# Patient Record
Sex: Male | Born: 1950 | Race: White | Hispanic: No | Marital: Married | Smoking: Former smoker
Health system: Southern US, Community
[De-identification: ages and names within clinical notes are randomized; demographics above are authoritative.]

## PROBLEM LIST (undated history)

## (undated) DIAGNOSIS — C4491 Basal cell carcinoma of skin, unspecified: Secondary | ICD-10-CM

## (undated) DIAGNOSIS — M1990 Unspecified osteoarthritis, unspecified site: Secondary | ICD-10-CM

## (undated) DIAGNOSIS — G1221 Amyotrophic lateral sclerosis: Secondary | ICD-10-CM

## (undated) DIAGNOSIS — N400 Enlarged prostate without lower urinary tract symptoms: Secondary | ICD-10-CM

## (undated) DIAGNOSIS — N289 Disorder of kidney and ureter, unspecified: Secondary | ICD-10-CM

## (undated) DIAGNOSIS — K5790 Diverticulosis of intestine, part unspecified, without perforation or abscess without bleeding: Secondary | ICD-10-CM

## (undated) DIAGNOSIS — H9190 Unspecified hearing loss, unspecified ear: Secondary | ICD-10-CM

## (undated) DIAGNOSIS — T7840XA Allergy, unspecified, initial encounter: Secondary | ICD-10-CM

## (undated) DIAGNOSIS — N200 Calculus of kidney: Secondary | ICD-10-CM

## (undated) DIAGNOSIS — E119 Type 2 diabetes mellitus without complications: Secondary | ICD-10-CM

## (undated) DIAGNOSIS — Z973 Presence of spectacles and contact lenses: Secondary | ICD-10-CM

## (undated) DIAGNOSIS — K8020 Calculus of gallbladder without cholecystitis without obstruction: Secondary | ICD-10-CM

## (undated) DIAGNOSIS — K635 Polyp of colon: Secondary | ICD-10-CM

## (undated) DIAGNOSIS — K219 Gastro-esophageal reflux disease without esophagitis: Secondary | ICD-10-CM

## (undated) HISTORY — DX: Polyp of colon: K63.5

## (undated) HISTORY — PX: SMALL INTESTINE SURGERY: SHX150

## (undated) HISTORY — DX: Basal cell carcinoma of skin, unspecified: C44.91

## (undated) HISTORY — PX: HERNIA REPAIR: SHX51

## (undated) HISTORY — DX: Calculus of kidney: N20.0

## (undated) HISTORY — PX: CHOLECYSTECTOMY: SHX55

## (undated) HISTORY — DX: Calculus of gallbladder without cholecystitis without obstruction: K80.20

## (undated) HISTORY — DX: Type 2 diabetes mellitus without complications: E11.9

## (undated) HISTORY — PX: CARPAL TUNNEL RELEASE: SHX101

## (undated) HISTORY — DX: Allergy, unspecified, initial encounter: T78.40XA

## (undated) HISTORY — DX: Diverticulosis of intestine, part unspecified, without perforation or abscess without bleeding: K57.90

---

## 2016-10-20 DIAGNOSIS — G5602 Carpal tunnel syndrome, left upper limb: Secondary | ICD-10-CM | POA: Diagnosis not present

## 2016-10-25 DIAGNOSIS — M79642 Pain in left hand: Secondary | ICD-10-CM | POA: Diagnosis not present

## 2016-10-26 DIAGNOSIS — N182 Chronic kidney disease, stage 2 (mild): Secondary | ICD-10-CM | POA: Diagnosis not present

## 2016-10-26 DIAGNOSIS — Z1159 Encounter for screening for other viral diseases: Secondary | ICD-10-CM | POA: Diagnosis not present

## 2016-10-26 DIAGNOSIS — E8881 Metabolic syndrome: Secondary | ICD-10-CM | POA: Diagnosis not present

## 2016-10-26 DIAGNOSIS — Z794 Long term (current) use of insulin: Secondary | ICD-10-CM | POA: Diagnosis not present

## 2016-10-26 DIAGNOSIS — E1122 Type 2 diabetes mellitus with diabetic chronic kidney disease: Secondary | ICD-10-CM | POA: Diagnosis not present

## 2016-10-26 DIAGNOSIS — E785 Hyperlipidemia, unspecified: Secondary | ICD-10-CM | POA: Diagnosis not present

## 2016-10-26 DIAGNOSIS — Z125 Encounter for screening for malignant neoplasm of prostate: Secondary | ICD-10-CM | POA: Diagnosis not present

## 2016-10-27 DIAGNOSIS — M79604 Pain in right leg: Secondary | ICD-10-CM | POA: Diagnosis not present

## 2016-10-27 DIAGNOSIS — R938 Abnormal findings on diagnostic imaging of other specified body structures: Secondary | ICD-10-CM | POA: Diagnosis not present

## 2017-02-09 DIAGNOSIS — E1169 Type 2 diabetes mellitus with other specified complication: Secondary | ICD-10-CM | POA: Diagnosis not present

## 2017-02-09 DIAGNOSIS — Z794 Long term (current) use of insulin: Secondary | ICD-10-CM | POA: Diagnosis not present

## 2017-02-09 DIAGNOSIS — N400 Enlarged prostate without lower urinary tract symptoms: Secondary | ICD-10-CM | POA: Diagnosis not present

## 2017-02-09 DIAGNOSIS — E785 Hyperlipidemia, unspecified: Secondary | ICD-10-CM | POA: Diagnosis not present

## 2017-02-09 DIAGNOSIS — M6530 Trigger finger, unspecified finger: Secondary | ICD-10-CM | POA: Diagnosis not present

## 2017-03-25 DIAGNOSIS — Z794 Long term (current) use of insulin: Secondary | ICD-10-CM | POA: Diagnosis not present

## 2017-03-25 DIAGNOSIS — Z125 Encounter for screening for malignant neoplasm of prostate: Secondary | ICD-10-CM | POA: Diagnosis not present

## 2017-03-25 DIAGNOSIS — E119 Type 2 diabetes mellitus without complications: Secondary | ICD-10-CM | POA: Diagnosis not present

## 2017-03-25 DIAGNOSIS — E785 Hyperlipidemia, unspecified: Secondary | ICD-10-CM | POA: Diagnosis not present

## 2017-05-23 DIAGNOSIS — E119 Type 2 diabetes mellitus without complications: Secondary | ICD-10-CM | POA: Diagnosis not present

## 2017-05-23 DIAGNOSIS — I8393 Asymptomatic varicose veins of bilateral lower extremities: Secondary | ICD-10-CM | POA: Diagnosis not present

## 2017-05-23 DIAGNOSIS — E785 Hyperlipidemia, unspecified: Secondary | ICD-10-CM | POA: Diagnosis not present

## 2017-05-23 DIAGNOSIS — Z794 Long term (current) use of insulin: Secondary | ICD-10-CM | POA: Diagnosis not present

## 2017-05-23 DIAGNOSIS — N400 Enlarged prostate without lower urinary tract symptoms: Secondary | ICD-10-CM | POA: Diagnosis not present

## 2017-06-27 DIAGNOSIS — H9193 Unspecified hearing loss, bilateral: Secondary | ICD-10-CM | POA: Diagnosis not present

## 2017-06-27 DIAGNOSIS — E119 Type 2 diabetes mellitus without complications: Secondary | ICD-10-CM | POA: Diagnosis not present

## 2017-06-27 DIAGNOSIS — E785 Hyperlipidemia, unspecified: Secondary | ICD-10-CM | POA: Diagnosis not present

## 2017-06-27 DIAGNOSIS — N400 Enlarged prostate without lower urinary tract symptoms: Secondary | ICD-10-CM | POA: Diagnosis not present

## 2017-06-27 DIAGNOSIS — I8393 Asymptomatic varicose veins of bilateral lower extremities: Secondary | ICD-10-CM | POA: Diagnosis not present

## 2017-06-27 DIAGNOSIS — E6609 Other obesity due to excess calories: Secondary | ICD-10-CM | POA: Diagnosis not present

## 2017-06-27 DIAGNOSIS — Z6834 Body mass index (BMI) 34.0-34.9, adult: Secondary | ICD-10-CM | POA: Diagnosis not present

## 2017-06-27 DIAGNOSIS — Z66 Do not resuscitate: Secondary | ICD-10-CM | POA: Diagnosis not present

## 2017-06-27 DIAGNOSIS — I10 Essential (primary) hypertension: Secondary | ICD-10-CM | POA: Diagnosis not present

## 2017-06-27 DIAGNOSIS — Z794 Long term (current) use of insulin: Secondary | ICD-10-CM | POA: Diagnosis not present

## 2017-08-31 DIAGNOSIS — M79671 Pain in right foot: Secondary | ICD-10-CM | POA: Diagnosis not present

## 2017-08-31 DIAGNOSIS — E1169 Type 2 diabetes mellitus with other specified complication: Secondary | ICD-10-CM | POA: Diagnosis not present

## 2017-08-31 DIAGNOSIS — Z794 Long term (current) use of insulin: Secondary | ICD-10-CM | POA: Diagnosis not present

## 2017-08-31 DIAGNOSIS — E785 Hyperlipidemia, unspecified: Secondary | ICD-10-CM | POA: Diagnosis not present

## 2017-08-31 DIAGNOSIS — M6530 Trigger finger, unspecified finger: Secondary | ICD-10-CM | POA: Diagnosis not present

## 2017-08-31 DIAGNOSIS — N400 Enlarged prostate without lower urinary tract symptoms: Secondary | ICD-10-CM | POA: Diagnosis not present

## 2017-08-31 DIAGNOSIS — M79672 Pain in left foot: Secondary | ICD-10-CM | POA: Diagnosis not present

## 2017-08-31 DIAGNOSIS — E119 Type 2 diabetes mellitus without complications: Secondary | ICD-10-CM | POA: Diagnosis not present

## 2017-12-06 DIAGNOSIS — M7742 Metatarsalgia, left foot: Secondary | ICD-10-CM | POA: Diagnosis not present

## 2017-12-06 DIAGNOSIS — R29898 Other symptoms and signs involving the musculoskeletal system: Secondary | ICD-10-CM | POA: Diagnosis not present

## 2017-12-06 DIAGNOSIS — M7741 Metatarsalgia, right foot: Secondary | ICD-10-CM | POA: Diagnosis not present

## 2017-12-09 DIAGNOSIS — H04123 Dry eye syndrome of bilateral lacrimal glands: Secondary | ICD-10-CM | POA: Diagnosis not present

## 2017-12-09 DIAGNOSIS — E119 Type 2 diabetes mellitus without complications: Secondary | ICD-10-CM | POA: Diagnosis not present

## 2017-12-09 DIAGNOSIS — H2511 Age-related nuclear cataract, right eye: Secondary | ICD-10-CM | POA: Diagnosis not present

## 2017-12-09 DIAGNOSIS — Z961 Presence of intraocular lens: Secondary | ICD-10-CM | POA: Diagnosis not present

## 2017-12-20 DIAGNOSIS — R350 Frequency of micturition: Secondary | ICD-10-CM | POA: Diagnosis not present

## 2017-12-20 DIAGNOSIS — N400 Enlarged prostate without lower urinary tract symptoms: Secondary | ICD-10-CM | POA: Diagnosis not present

## 2017-12-26 DIAGNOSIS — H2510 Age-related nuclear cataract, unspecified eye: Secondary | ICD-10-CM | POA: Diagnosis not present

## 2018-01-05 DIAGNOSIS — H25011 Cortical age-related cataract, right eye: Secondary | ICD-10-CM | POA: Diagnosis not present

## 2018-01-05 DIAGNOSIS — H2511 Age-related nuclear cataract, right eye: Secondary | ICD-10-CM | POA: Diagnosis not present

## 2018-01-05 DIAGNOSIS — E119 Type 2 diabetes mellitus without complications: Secondary | ICD-10-CM | POA: Diagnosis not present

## 2018-01-05 DIAGNOSIS — H40013 Open angle with borderline findings, low risk, bilateral: Secondary | ICD-10-CM | POA: Diagnosis not present

## 2018-01-31 DIAGNOSIS — H2513 Age-related nuclear cataract, bilateral: Secondary | ICD-10-CM | POA: Diagnosis not present

## 2018-01-31 DIAGNOSIS — H25811 Combined forms of age-related cataract, right eye: Secondary | ICD-10-CM | POA: Diagnosis not present

## 2018-02-02 DIAGNOSIS — J0101 Acute recurrent maxillary sinusitis: Secondary | ICD-10-CM | POA: Diagnosis not present

## 2018-02-02 DIAGNOSIS — J209 Acute bronchitis, unspecified: Secondary | ICD-10-CM | POA: Diagnosis not present

## 2018-02-07 DIAGNOSIS — H2511 Age-related nuclear cataract, right eye: Secondary | ICD-10-CM | POA: Diagnosis not present

## 2018-05-03 DIAGNOSIS — R350 Frequency of micturition: Secondary | ICD-10-CM | POA: Diagnosis not present

## 2018-05-03 DIAGNOSIS — L918 Other hypertrophic disorders of the skin: Secondary | ICD-10-CM | POA: Diagnosis not present

## 2018-05-03 DIAGNOSIS — N400 Enlarged prostate without lower urinary tract symptoms: Secondary | ICD-10-CM | POA: Diagnosis not present

## 2018-05-03 DIAGNOSIS — M79604 Pain in right leg: Secondary | ICD-10-CM | POA: Diagnosis not present

## 2018-05-03 DIAGNOSIS — E119 Type 2 diabetes mellitus without complications: Secondary | ICD-10-CM | POA: Diagnosis not present

## 2018-05-03 DIAGNOSIS — M79605 Pain in left leg: Secondary | ICD-10-CM | POA: Diagnosis not present

## 2018-05-03 DIAGNOSIS — L820 Inflamed seborrheic keratosis: Secondary | ICD-10-CM | POA: Diagnosis not present

## 2018-05-03 DIAGNOSIS — I83813 Varicose veins of bilateral lower extremities with pain: Secondary | ICD-10-CM | POA: Diagnosis not present

## 2018-05-04 DIAGNOSIS — E119 Type 2 diabetes mellitus without complications: Secondary | ICD-10-CM | POA: Diagnosis not present

## 2018-05-08 DIAGNOSIS — M79604 Pain in right leg: Secondary | ICD-10-CM | POA: Diagnosis not present

## 2018-05-08 DIAGNOSIS — M79605 Pain in left leg: Secondary | ICD-10-CM | POA: Diagnosis not present

## 2018-05-08 DIAGNOSIS — I83893 Varicose veins of bilateral lower extremities with other complications: Secondary | ICD-10-CM | POA: Diagnosis not present

## 2018-05-10 DIAGNOSIS — M25561 Pain in right knee: Secondary | ICD-10-CM | POA: Diagnosis not present

## 2018-05-10 DIAGNOSIS — M25562 Pain in left knee: Secondary | ICD-10-CM | POA: Diagnosis not present

## 2018-05-10 DIAGNOSIS — M170 Bilateral primary osteoarthritis of knee: Secondary | ICD-10-CM | POA: Diagnosis not present

## 2018-05-12 DIAGNOSIS — M170 Bilateral primary osteoarthritis of knee: Secondary | ICD-10-CM | POA: Diagnosis not present

## 2018-05-24 DIAGNOSIS — D225 Melanocytic nevi of trunk: Secondary | ICD-10-CM | POA: Diagnosis not present

## 2018-05-24 DIAGNOSIS — B078 Other viral warts: Secondary | ICD-10-CM | POA: Diagnosis not present

## 2018-08-01 DIAGNOSIS — E119 Type 2 diabetes mellitus without complications: Secondary | ICD-10-CM | POA: Diagnosis not present

## 2018-08-04 DIAGNOSIS — I10 Essential (primary) hypertension: Secondary | ICD-10-CM | POA: Diagnosis not present

## 2018-08-04 DIAGNOSIS — E785 Hyperlipidemia, unspecified: Secondary | ICD-10-CM | POA: Diagnosis not present

## 2018-08-04 DIAGNOSIS — E1169 Type 2 diabetes mellitus with other specified complication: Secondary | ICD-10-CM | POA: Diagnosis not present

## 2018-08-04 DIAGNOSIS — E119 Type 2 diabetes mellitus without complications: Secondary | ICD-10-CM | POA: Diagnosis not present

## 2018-08-04 DIAGNOSIS — E669 Obesity, unspecified: Secondary | ICD-10-CM | POA: Diagnosis not present

## 2018-08-04 DIAGNOSIS — N400 Enlarged prostate without lower urinary tract symptoms: Secondary | ICD-10-CM | POA: Diagnosis not present

## 2018-08-04 DIAGNOSIS — Z6835 Body mass index (BMI) 35.0-35.9, adult: Secondary | ICD-10-CM | POA: Diagnosis not present

## 2018-08-04 DIAGNOSIS — Z0000 Encounter for general adult medical examination without abnormal findings: Secondary | ICD-10-CM | POA: Diagnosis not present

## 2018-08-04 DIAGNOSIS — Z794 Long term (current) use of insulin: Secondary | ICD-10-CM | POA: Diagnosis not present

## 2018-08-04 DIAGNOSIS — Z23 Encounter for immunization: Secondary | ICD-10-CM | POA: Diagnosis not present

## 2018-08-07 DIAGNOSIS — M79604 Pain in right leg: Secondary | ICD-10-CM | POA: Diagnosis not present

## 2018-08-07 DIAGNOSIS — M79605 Pain in left leg: Secondary | ICD-10-CM | POA: Diagnosis not present

## 2018-08-07 DIAGNOSIS — I83893 Varicose veins of bilateral lower extremities with other complications: Secondary | ICD-10-CM | POA: Diagnosis not present

## 2018-08-26 DIAGNOSIS — I10 Essential (primary) hypertension: Secondary | ICD-10-CM | POA: Diagnosis not present

## 2018-08-26 DIAGNOSIS — Z7982 Long term (current) use of aspirin: Secondary | ICD-10-CM | POA: Diagnosis not present

## 2018-08-26 DIAGNOSIS — Z6834 Body mass index (BMI) 34.0-34.9, adult: Secondary | ICD-10-CM | POA: Diagnosis not present

## 2018-08-26 DIAGNOSIS — L602 Onychogryphosis: Secondary | ICD-10-CM | POA: Diagnosis not present

## 2018-08-26 DIAGNOSIS — E119 Type 2 diabetes mellitus without complications: Secondary | ICD-10-CM | POA: Diagnosis not present

## 2018-08-26 DIAGNOSIS — N400 Enlarged prostate without lower urinary tract symptoms: Secondary | ICD-10-CM | POA: Diagnosis not present

## 2018-08-26 DIAGNOSIS — E6609 Other obesity due to excess calories: Secondary | ICD-10-CM | POA: Diagnosis not present

## 2018-08-26 DIAGNOSIS — E785 Hyperlipidemia, unspecified: Secondary | ICD-10-CM | POA: Diagnosis not present

## 2018-08-26 DIAGNOSIS — Z823 Family history of stroke: Secondary | ICD-10-CM | POA: Diagnosis not present

## 2018-08-26 DIAGNOSIS — Z794 Long term (current) use of insulin: Secondary | ICD-10-CM | POA: Diagnosis not present

## 2018-10-30 DIAGNOSIS — E119 Type 2 diabetes mellitus without complications: Secondary | ICD-10-CM | POA: Diagnosis not present

## 2018-12-04 DIAGNOSIS — M1711 Unilateral primary osteoarthritis, right knee: Secondary | ICD-10-CM | POA: Diagnosis not present

## 2018-12-04 DIAGNOSIS — M25561 Pain in right knee: Secondary | ICD-10-CM | POA: Diagnosis not present

## 2018-12-04 DIAGNOSIS — G8929 Other chronic pain: Secondary | ICD-10-CM | POA: Diagnosis not present

## 2018-12-07 ENCOUNTER — Other Ambulatory Visit: Payer: Self-pay | Admitting: Surgery

## 2018-12-07 DIAGNOSIS — M25561 Pain in right knee: Secondary | ICD-10-CM

## 2018-12-07 DIAGNOSIS — G8929 Other chronic pain: Secondary | ICD-10-CM

## 2018-12-07 DIAGNOSIS — M1711 Unilateral primary osteoarthritis, right knee: Secondary | ICD-10-CM

## 2018-12-12 ENCOUNTER — Ambulatory Visit
Admission: RE | Admit: 2018-12-12 | Discharge: 2018-12-12 | Disposition: A | Discharge status: Home / Self Care | Payer: Medicare HMO | Source: Ambulatory Visit | Attending: Surgery | Admitting: Surgery

## 2018-12-12 DIAGNOSIS — G8929 Other chronic pain: Secondary | ICD-10-CM

## 2018-12-12 DIAGNOSIS — M25561 Pain in right knee: Secondary | ICD-10-CM | POA: Insufficient documentation

## 2018-12-12 DIAGNOSIS — M1711 Unilateral primary osteoarthritis, right knee: Secondary | ICD-10-CM | POA: Insufficient documentation

## 2019-01-30 DIAGNOSIS — E119 Type 2 diabetes mellitus without complications: Secondary | ICD-10-CM | POA: Diagnosis not present

## 2019-02-13 DIAGNOSIS — E1169 Type 2 diabetes mellitus with other specified complication: Secondary | ICD-10-CM | POA: Diagnosis not present

## 2019-02-13 DIAGNOSIS — I10 Essential (primary) hypertension: Secondary | ICD-10-CM | POA: Diagnosis not present

## 2019-02-13 DIAGNOSIS — E785 Hyperlipidemia, unspecified: Secondary | ICD-10-CM | POA: Diagnosis not present

## 2019-02-13 DIAGNOSIS — Z794 Long term (current) use of insulin: Secondary | ICD-10-CM | POA: Diagnosis not present

## 2019-02-13 DIAGNOSIS — J0191 Acute recurrent sinusitis, unspecified: Secondary | ICD-10-CM | POA: Diagnosis not present

## 2019-04-30 DIAGNOSIS — E119 Type 2 diabetes mellitus without complications: Secondary | ICD-10-CM | POA: Diagnosis not present

## 2019-05-03 DIAGNOSIS — E1169 Type 2 diabetes mellitus with other specified complication: Secondary | ICD-10-CM | POA: Diagnosis not present

## 2019-05-03 DIAGNOSIS — B353 Tinea pedis: Secondary | ICD-10-CM | POA: Diagnosis not present

## 2019-05-03 DIAGNOSIS — E785 Hyperlipidemia, unspecified: Secondary | ICD-10-CM | POA: Diagnosis not present

## 2019-05-06 DIAGNOSIS — R21 Rash and other nonspecific skin eruption: Secondary | ICD-10-CM | POA: Diagnosis not present

## 2019-05-06 DIAGNOSIS — L03115 Cellulitis of right lower limb: Secondary | ICD-10-CM | POA: Insufficient documentation

## 2019-05-10 ENCOUNTER — Other Ambulatory Visit: Payer: Self-pay

## 2019-05-10 ENCOUNTER — Emergency Department: Payer: Medicare HMO

## 2019-05-10 ENCOUNTER — Emergency Department
Admission: EM | Admit: 2019-05-10 | Discharge: 2019-05-10 | Disposition: A | Discharge status: Home / Self Care | Payer: Medicare HMO | Attending: Emergency Medicine | Admitting: Emergency Medicine

## 2019-05-10 ENCOUNTER — Encounter: Payer: Self-pay | Admitting: Emergency Medicine

## 2019-05-10 DIAGNOSIS — R600 Localized edema: Secondary | ICD-10-CM | POA: Diagnosis not present

## 2019-05-10 DIAGNOSIS — I871 Compression of vein: Secondary | ICD-10-CM | POA: Diagnosis not present

## 2019-05-10 DIAGNOSIS — R2241 Localized swelling, mass and lump, right lower limb: Secondary | ICD-10-CM | POA: Diagnosis not present

## 2019-05-10 DIAGNOSIS — E119 Type 2 diabetes mellitus without complications: Secondary | ICD-10-CM | POA: Diagnosis not present

## 2019-05-10 DIAGNOSIS — L03115 Cellulitis of right lower limb: Secondary | ICD-10-CM | POA: Insufficient documentation

## 2019-05-10 DIAGNOSIS — Z7982 Long term (current) use of aspirin: Secondary | ICD-10-CM | POA: Insufficient documentation

## 2019-05-10 DIAGNOSIS — M79604 Pain in right leg: Secondary | ICD-10-CM | POA: Diagnosis present

## 2019-05-10 DIAGNOSIS — I872 Venous insufficiency (chronic) (peripheral): Secondary | ICD-10-CM

## 2019-05-10 HISTORY — DX: Disorder of kidney and ureter, unspecified: N28.9

## 2019-05-10 LAB — CBC WITH DIFFERENTIAL/PLATELET
Abs Immature Granulocytes: 0.02 10*3/uL (ref 0.00–0.07)
Basophils Absolute: 0.1 10*3/uL (ref 0.0–0.1)
Basophils Relative: 1 %
Eosinophils Absolute: 0.4 10*3/uL (ref 0.0–0.5)
Eosinophils Relative: 5 %
HCT: 46 % (ref 39.0–52.0)
Hemoglobin: 15.7 g/dL (ref 13.0–17.0)
Immature Granulocytes: 0 %
Lymphocytes Relative: 24 %
Lymphs Abs: 1.8 10*3/uL (ref 0.7–4.0)
MCH: 30.5 pg (ref 26.0–34.0)
MCHC: 34.1 g/dL (ref 30.0–36.0)
MCV: 89.3 fL (ref 80.0–100.0)
Monocytes Absolute: 0.8 10*3/uL (ref 0.1–1.0)
Monocytes Relative: 10 %
Neutro Abs: 4.6 10*3/uL (ref 1.7–7.7)
Neutrophils Relative %: 60 %
Platelets: 223 10*3/uL (ref 150–400)
RBC: 5.15 MIL/uL (ref 4.22–5.81)
RDW: 12.7 % (ref 11.5–15.5)
WBC: 7.6 10*3/uL (ref 4.0–10.5)
nRBC: 0 % (ref 0.0–0.2)

## 2019-05-10 LAB — BASIC METABOLIC PANEL
Anion gap: 11 (ref 5–15)
BUN: 18 mg/dL (ref 8–23)
CO2: 21 mmol/L — ABNORMAL LOW (ref 22–32)
Calcium: 9 mg/dL (ref 8.9–10.3)
Chloride: 107 mmol/L (ref 98–111)
Creatinine, Ser: 0.85 mg/dL (ref 0.61–1.24)
GFR calc Af Amer: >60 mL/min (ref >60)
GFR calc non Af Amer: >60 mL/min (ref >60)
Glucose, Bld: 152 mg/dL — ABNORMAL HIGH (ref 70–99)
Potassium: 4 mmol/L (ref 3.5–5.1)
Sodium: 139 mmol/L (ref 135–145)

## 2019-05-10 MED ORDER — CLINDAMYCIN PHOSPHATE 600 MG/50ML IV SOLN
600.0000 mg | Freq: Once | INTRAVENOUS | Status: AC
  Administered 2019-05-10: 600 mg via INTRAVENOUS
  Filled 2019-05-10: qty 50

## 2019-05-10 MED ORDER — HYDROXYZINE HCL 50 MG PO TABS
50.0000 mg | ORAL_TABLET | Freq: Once | ORAL | Status: AC
  Administered 2019-05-10: 12:00:00 50 mg via ORAL
  Filled 2019-05-10: qty 1

## 2019-05-10 MED ORDER — HYDROXYZINE HCL 50 MG PO TABS: 50.0000 mg | ORAL_TABLET | Freq: Three times a day (TID) | ORAL | 0 refills | Status: DC | PRN

## 2019-05-10 MED ORDER — CLINDAMYCIN HCL 300 MG PO CAPS: 300.0000 mg | ORAL_CAPSULE | Freq: Three times a day (TID) | ORAL | 0 refills | Status: AC

## 2019-05-10 NOTE — ED Provider Notes (Signed)
[facility] Emergency Department Provider Note   ____________________________________________   First MD Initiated Contact with Patient 05/10/19 1119     (approximate)  I have reviewed the triage vital signs and the nursing notes.   HISTORY  Chief Complaint Rash    HPI Jonathan Shelton is a 68 y.o. male patient complain of a rash to the right medial ankle secondary to scratching area 2 weeks ago.  Patient state teleconference evaluation with PCP and diagnosed with athlete's foot.  Patient state he was given a cream which darkened his skin.  Patient is worried about "flesh eating disease" due to a family member having this condition 2 years ago.  Patient also relates that he hit his right shin on a metal boat 2 months ago and fell onto the leg.  Patient state he was in [place] last week and went to urgent care clinic.  Patient was not given a definitive diagnosis but was prescribed a steroidal cream and doxycycline.  Patient denies fever or drainage from area at this time.     Past Medical History:  Diagnosis Date  . Diabetes mellitus without complication ([place])   . Renal disorder     There are no active problems to display for this patient.   Past Surgical History:  Procedure Laterality Date  . CHOLECYSTECTOMY    . HERNIA REPAIR      Prior to Admission medications   Medication Sig Start Date End Date Taking? Authorizing Provider  aspirin 81 MG chewable tablet Chew by mouth daily.   Yes [provider]  atorvastatin (LIPITOR) 10 MG tablet Take 10 mg by mouth daily.   Yes [provider]  empagliflozin (JARDIANCE) 25 MG TABS tablet Take 25 mg by mouth daily.   Yes [provider]  losartan (COZAAR) 50 MG tablet Take 50 mg by mouth daily.   Yes [provider]  metFORMIN (GLUCOPHAGE) 1000 MG tablet Take 1,000 mg by mouth 2 (two) times daily with a meal.   Yes [provider]  clindamycin (CLEOCIN) 300 MG  capsule Take 1 capsule (300 mg total) by mouth 3 (three) times daily for 10 days. 05/10/19 05/20/19  Sable Feil, PA-C  hydrOXYzine (ATARAX/VISTARIL) 50 MG tablet Take 1 tablet (50 mg total) by mouth 3 (three) times daily as needed for itching. 05/10/19   Sable Feil, PA-C    Allergies Testosterone  No family history on file.  Social History Social History   Tobacco Use  . Smoking status: Never Smoker  . Smokeless tobacco: Never Used  Substance Use Topics  . Alcohol use: Not Currently  . Drug use: Not on file    Review of Systems  Constitutional: No fever/chills Eyes: No visual changes. ENT: No sore throat. Cardiovascular: Denies chest pain. Respiratory: Denies shortness of breath. Gastrointestinal: No abdominal pain.  No nausea, no vomiting.  No diarrhea.  No constipation. Genitourinary: Negative for dysuria. Musculoskeletal: Negative for back pain. Skin: Edema and erythema right medial ankle.   Neurological: Negative for headaches, focal weakness or numbness. Endocrine:  Diabetes Allergic/Immunilogical: Testosterone ____________________________________________   PHYSICAL EXAM:  VITAL SIGNS: ED Triage Vitals  Enc Vitals Group     BP 05/10/19 1044 (!) 144/82     Pulse Rate 05/10/19 1044 79     Resp 05/10/19 1044 17     Temp 05/10/19 1044 98.6 F (37 C)     Temp Source 05/10/19 1044 Oral     SpO2 05/10/19 1044 95 %  Weight 05/10/19 1045 250 lb (113.4 kg)     Height 05/10/19 1045 5\' 10"  (1.778 m)     Head Circumference --      Peak Flow --      Pain Score 05/10/19 1051 0     Pain Loc --      Pain Edu? --      Excl. in [place]? --     Constitutional: Alert and oriented. Well appearing and in no acute distress. Cardiovascular: Normal rate, regular rhythm. Grossly normal heart sounds.  Good peripheral circulation.  Bilateral varicose veins. Respiratory: Normal respiratory effort.  No retractions. Lungs CTAB. Musculoskeletal: Edema  right lower leg.    Neurologic:  Normal speech and language. No gross focal neurologic deficits are appreciated. No gait instability. Skin: Lower extremities edematous/erythematous. Psychiatric: Mood and affect are normal. Speech and behavior are normal.  ____________________________________________   LABS (all labs ordered are listed, but only abnormal results are displayed)  Labs Reviewed  BASIC METABOLIC PANEL - Abnormal; Notable for the following components:      Result Value   CO2 21 (*)    Glucose, Bld 152 (*)    All other components within normal limits  CBC WITH DIFFERENTIAL/PLATELET   ____________________________________________  EKG   ____________________________________________  RADIOLOGY  ED MD interpretation:    Official radiology report(s): US Venous Img Lower Unilateral Right  Result Date: 05/10/2019 CLINICAL DATA:  68 year old male with a history edema and redness EXAM: RIGHT LOWER EXTREMITY VENOUS DOPPLER ULTRASOUND TECHNIQUE: Gray-scale sonography with graded compression, as well as color Doppler and duplex ultrasound were performed to evaluate the lower extremity deep venous systems from the level of the common femoral vein and including the common femoral, femoral, profunda femoral, popliteal and calf veins including the posterior tibial, peroneal and gastrocnemius veins when visible. The superficial great saphenous vein was also interrogated. Spectral Doppler was utilized to evaluate flow at rest and with distal augmentation maneuvers in the common femoral, femoral and popliteal veins. COMPARISON:  None. FINDINGS: Contralateral Common Femoral Vein: Respiratory phasicity is normal and symmetric with the symptomatic side. No evidence of thrombus. Normal compressibility. Common Femoral Vein: No evidence of thrombus. Normal compressibility, respiratory phasicity and response to augmentation. Saphenofemoral Junction: No evidence of thrombus. Normal compressibility and flow on color  Doppler imaging. Profunda Femoral Vein: No evidence of thrombus. Normal compressibility and flow on color Doppler imaging. Femoral Vein: No evidence of thrombus. Normal compressibility, respiratory phasicity and response to augmentation. Popliteal Vein: No evidence of thrombus. Normal compressibility, respiratory phasicity and response to augmentation. Calf Veins: No evidence of thrombus. Normal compressibility and flow on color Doppler imaging. Superficial Great Saphenous Vein: No evidence of thrombus. Normal compressibility and flow on color Doppler imaging. Other Findings:  Edema of the right lower extremity IMPRESSION: Sonographic survey of the right lower extremity negative for DVT Right lower extremity edema Electronically Signed   By: Corrie Mckusick D.O.   On: 05/10/2019 13:22    ____________________________________________   PROCEDURES  Procedure(s) performed (including Critical Care):  Procedures   ____________________________________________   INITIAL IMPRESSION / ASSESSMENT AND PLAN / ED COURSE  As part of my medical decision making, I reviewed the following data within the [place]         Patient presents with edema and erythema to the right lower leg.  Patient is very anxious secondary to father having similar complaints and diagnosed with "flesh eating disease".  Differential consist of cellulitis, phlebitis, and peripheral edema.  Discussed labs and cell findings with patient.  Patient given discharge care instruction advised take medication as directed.  Patient advised follow-up PCP.      ____________________________________________   FINAL CLINICAL IMPRESSION(S) / ED DIAGNOSES  Final diagnoses:  Cellulitis of right lower leg  Edema of right lower extremity due to peripheral venous insufficiency     ED Discharge Orders         Ordered    clindamycin (CLEOCIN) 300 MG capsule  3 times daily     05/10/19 1343    hydrOXYzine (ATARAX/VISTARIL)  50 MG tablet  3 times daily PRN     05/10/19 1343           Note:  This document was prepared using Dragon voice recognition software and may include unintentional dictation errors.    Sable Feil, PA-C 05/10/19 1432    Earleen Newport, MD 05/10/19 [phone]

## 2019-05-10 NOTE — Discharge Instructions (Addendum)
Discontinue doxycycline and cream.

## 2019-05-10 NOTE — ED Notes (Signed)
See triage note  States he noticed a small area of rash to right ankle area about 2 months ago  Then hit his lower leg after that developed bruised area   Then developed rash to lower legs. Was seen at urgent care in [place] and placed on doxy   States area is not any better

## 2019-05-10 NOTE — ED Triage Notes (Signed)
Says had an itch aobut 2 weeks ago on right nakle and he scratched it and broke the skin.  Did e visit and they dx athletes foot  last thursday.  They gave steroid and cream.  He says the rash was not as dark then.  He also says he is worried about flesh eating disease due [place] he went to [facility] on Sunday in [place]--says the doc did not know what it was but prescibed doxycycline.

## 2019-05-16 DIAGNOSIS — I872 Venous insufficiency (chronic) (peripheral): Secondary | ICD-10-CM | POA: Diagnosis not present

## 2019-05-16 DIAGNOSIS — L308 Other specified dermatitis: Secondary | ICD-10-CM | POA: Diagnosis not present

## 2019-05-30 ENCOUNTER — Ambulatory Visit (INDEPENDENT_AMBULATORY_CARE_PROVIDER_SITE_OTHER): Payer: Medicare HMO | Admitting: Family Medicine

## 2019-05-30 ENCOUNTER — Other Ambulatory Visit: Payer: Self-pay | Admitting: Family Medicine

## 2019-05-30 ENCOUNTER — Encounter: Payer: Self-pay | Admitting: Family Medicine

## 2019-05-30 ENCOUNTER — Other Ambulatory Visit: Payer: Self-pay

## 2019-05-30 VITALS — BP 120/60 | HR 79 | Temp 99.0°F | Resp 16 | Ht 70.0 in | Wt 252.0 lb

## 2019-05-30 DIAGNOSIS — Z794 Long term (current) use of insulin: Secondary | ICD-10-CM | POA: Diagnosis not present

## 2019-05-30 DIAGNOSIS — N401 Enlarged prostate with lower urinary tract symptoms: Secondary | ICD-10-CM

## 2019-05-30 DIAGNOSIS — E66811 Obesity, class 1: Secondary | ICD-10-CM | POA: Insufficient documentation

## 2019-05-30 DIAGNOSIS — E785 Hyperlipidemia, unspecified: Secondary | ICD-10-CM

## 2019-05-30 DIAGNOSIS — R3914 Feeling of incomplete bladder emptying: Secondary | ICD-10-CM

## 2019-05-30 DIAGNOSIS — E1169 Type 2 diabetes mellitus with other specified complication: Secondary | ICD-10-CM | POA: Insufficient documentation

## 2019-05-30 DIAGNOSIS — E119 Type 2 diabetes mellitus without complications: Secondary | ICD-10-CM | POA: Insufficient documentation

## 2019-05-30 DIAGNOSIS — E669 Obesity, unspecified: Secondary | ICD-10-CM | POA: Insufficient documentation

## 2019-05-30 MED ORDER — JARDIANCE 25 MG PO TABS: 25.0000 mg | ORAL_TABLET | Freq: Every day | ORAL | 1 refills | Status: DC

## 2019-05-30 MED ORDER — ATORVASTATIN CALCIUM 10 MG PO TABS: 10.0000 mg | ORAL_TABLET | Freq: Every day | ORAL | 1 refills | Status: DC

## 2019-05-30 NOTE — Progress Notes (Signed)
Subjective:    Patient ID: Jonathan Shelton, male    DOB: August 22, 1951, 68 y.o.   MRN: [id number]  Jonathan Shelton is a 68 y.o. male presenting on 05/30/2019 for Establish Care (diabetes, Cellulitis of right lower leg was in ED recently)   Previous PCP Dr Gean Quint. He is transferring care to new office now. Recently seen at [facility] ED.  HPI  ED FOLLOW-UP VISIT  Hospital/Location: [facility] Date of ED Visit: 05/10/19  Reason for Presenting to ED: Rash / Cellulitis Leg Primary (+Secondary) Diagnosis: RLE Cellulitis, Venous insufficiency stasis dermatitis  FOLLOW-UP - ED provider note and record have been reviewed - Patient presents today about 20 days after recent ED visit. Brief summary of recent course PA evaluated by telemedicine on 05/03/19 - [facility] - used athlete's foot cream, Tinactin. Also given an antibiotic cream, Bactroban - He was in [place] and he went to an Urgent Care skin rash flared up, he was seen urgently for it, they treated with Doxycycline and another topical antibiotic - He could not get into his previous doctors office. They attempted to refer her to Dermatologist but awaiting for that apt still - He went to [facility] ED on Saturday 05/10/19 - Seen by Dermatologist locally in [place], dx with venous insufficiency and stasis dermatitis with some darkening skin in lower legs, treated with Triamcinolone 0.1% cream, using twice a day with good results - Now significantly improved. Doing well, using Triamcinolone. No more antibiotic currently.  Reviewed ED course, Dx Cellulitis given clindamycin, hydroxyzine, labs done, had Venous doppler RLE done, see results below  History of Bronchitis, recurrent Describes frequent episodes, usually only treatment that works in past he usually takes Cefuroxime 500mg  BID for 10 days  CHRONIC DM, Type 2: Reports concerns with gradual increase insulin. Last 07/2018, elevated at 8.2 CBGs: Avg 80-120 Meds: Novolin-N 45-60 units  nightly, Jardiance 25mg  daily, Metformin 1000mg  BID Reports good compliance. Tolerating well w/o side-effects Currently on ARB Denies hypoglycemia, polyuria, visual changes, numbness or tingling.  Request records from prior PCP, asking about future colonoscopy, need last record.  Depression screen PHQ 2/9 05/30/2019  Decreased Interest 0  Down, Depressed, Hopeless 0  PHQ - 2 Score 0    Past Medical History:  Diagnosis Date  . Allergy   . Renal disorder    Past Surgical History:  Procedure Laterality Date  . CARPAL TUNNEL RELEASE    . CHOLECYSTECTOMY    . HERNIA REPAIR     Social History   Socioeconomic History  . Marital status: Married    Spouse name: Not on file  . Number of children: Not on file  . Years of education: Not on file  . Highest education level: Not on file  Occupational History  . Not on file  Social Needs  . Financial resource strain: Not on file  . Food insecurity    Worry: Not on file    Inability: Not on file  . Transportation needs    Medical: Not on file    Non-medical: Not on file  Tobacco Use  . Smoking status: Former Smoker    Types: Cigarettes    Quit date: 1970    Years since quitting: 50.5  . Smokeless tobacco: Former Network engineer and Sexual Activity  . Alcohol use: Not Currently  . Drug use: Yes    Comment: past  . Sexual activity: Not on file  Lifestyle  . Physical activity    Days per week: Not on  file    Minutes per session: Not on file  . Stress: Not on file  Relationships  . Social Herbalist on phone: Not on file    Gets together: Not on file    Attends religious service: Not on file    Active member of club or organization: Not on file    Attends meetings of clubs or organizations: Not on file    Relationship status: Not on file  . Intimate partner violence    Fear of current or ex partner: Not on file    Emotionally abused: Not on file    Physically abused: Not on file    Forced sexual activity: Not  on file  Other Topics Concern  . Not on file  Social History Narrative  . Not on file   Family History  Problem Relation Age of Onset  . Heart disease Mother 23  . Heart disease Father   . Stroke Father 34  . Diabetes Father   . Heart attack Father    Current Outpatient Medications on File Prior to Visit  Medication Sig  . aspirin 81 MG chewable tablet Chew by mouth.  Drusilla Kanner FRUIT PO Take by mouth.  . finasteride (PROSCAR) 5 MG tablet TAKE ONE TABLET (5 MG DOSE) BY MOUTH DAILY.  . hydrOXYzine (ATARAX/VISTARIL) 50 MG tablet Take 1 tablet (50 mg total) by mouth 3 (three) times daily as needed for itching.  . losartan (COZAAR) 50 MG tablet Take 50 mg by mouth daily.  . metFORMIN (GLUCOPHAGE) 1000 MG tablet Take 1,000 mg by mouth 2 (two) times daily with a meal.  . tamsulosin (FLOMAX) 0.4 MG CAPS capsule TAKE ONE CAPSULE (0.4 MG DOSE) BY MOUTH DAILY.  Marland Kitchen triamcinolone cream (KENALOG) 0.1 % APPLY TO AFFECTED AREA TWICE A DAY AS NEEDED  . valsartan (DIOVAN) 80 MG tablet   . vitamin E 400 UNIT capsule Take 400 Units by mouth daily.  . insulin NPH Human (NOVOLIN N) 100 UNIT/ML injection Inject 0.45-0.6 mLs (45-60 Units total) into the skin at bedtime.   No current facility-administered medications on file prior to visit.     Review of Systems Per HPI unless specifically indicated above     Objective:    BP 120/60   Pulse 79   Temp 99 F (37.2 C) (Oral)   Resp 16   Ht 5\' 10"  (1.778 m)   Wt 252 lb (114.3 kg)   BMI 36.16 kg/m   Wt Readings from Last 3 Encounters:  05/30/19 252 lb (114.3 kg)  05/10/19 250 lb (113.4 kg)    Physical Exam Vitals signs and nursing note reviewed.  Constitutional:      General: He is not in acute distress.    Appearance: He is well-developed. He is not diaphoretic.     Comments: Well-appearing, comfortable, cooperative  HENT:     Head: Normocephalic and atraumatic.  Eyes:     General:        Right eye: No discharge.        Left eye: No  discharge.     Conjunctiva/sclera: Conjunctivae normal.  Cardiovascular:     Rate and Rhythm: Normal rate.  Pulmonary:     Effort: Pulmonary effort is normal.  Musculoskeletal:     Right lower leg: Edema (trace) present.     Left lower leg: No edema.  Skin:    General: Skin is warm and dry.     Findings: No erythema or rash.  Comments: Discoloration darker bilateral lower extremity venous stasis, improved. Resolved cellulitis  Neurological:     Mental Status: He is alert and oriented to person, place, and time.  Psychiatric:        Behavior: Behavior normal.     Comments: Well groomed, good eye contact, normal speech and thoughts       I have personally reviewed the radiology report from US Venous Doppler 05/10/19.  US Venous Img Lower Unilateral RightPerformed 05/10/2019 Final result  Study Result CLINICAL DATA: 68 year old male with a history edema and redness  EXAM: RIGHT LOWER EXTREMITY VENOUS DOPPLER ULTRASOUND  TECHNIQUE: Gray-scale sonography with graded compression, as well as color Doppler and duplex ultrasound were performed to evaluate the lower extremity deep venous systems from the level of the common femoral vein and including the common femoral, femoral, profunda femoral, popliteal and calf veins including the posterior tibial, peroneal and gastrocnemius veins when visible. The superficial great saphenous vein was also interrogated. Spectral Doppler was utilized to evaluate flow at rest and with distal augmentation maneuvers in the common femoral, femoral and popliteal veins.  COMPARISON: None.  FINDINGS: Contralateral Common Femoral Vein: Respiratory phasicity is normal and symmetric with the symptomatic side. No evidence of thrombus. Normal compressibility.  Common Femoral Vein: No evidence of thrombus. Normal compressibility, respiratory phasicity and response to augmentation.  Saphenofemoral Junction: No evidence of thrombus.  Normal compressibility and flow on color Doppler imaging.  Profunda Femoral Vein: No evidence of thrombus. Normal compressibility and flow on color Doppler imaging.  Femoral Vein: No evidence of thrombus. Normal compressibility, respiratory phasicity and response to augmentation.  Popliteal Vein: No evidence of thrombus. Normal compressibility, respiratory phasicity and response to augmentation.  Calf Veins: No evidence of thrombus. Normal compressibility and flow on color Doppler imaging.  Superficial Great Saphenous Vein: No evidence of thrombus. Normal compressibility and flow on color Doppler imaging.  Other Findings: Edema of the right lower extremity  IMPRESSION: Sonographic survey of the right lower extremity negative for DVT  Right lower extremity edema   Electronically Signed By: Corrie Mckusick D.O. On: 05/10/2019 13:22    Results for orders placed or performed during the hospital encounter of 05/10/19  CBC with Differential  Result Value Ref Range   WBC 7.6 4.0 - 10.5 K/uL   RBC 5.15 4.22 - 5.81 MIL/uL   Hemoglobin 15.7 13.0 - 17.0 g/dL   HCT 46.0 39.0 - 52.0 %   MCV 89.3 80.0 - 100.0 fL   MCH 30.5 26.0 - 34.0 pg   MCHC 34.1 30.0 - 36.0 g/dL   RDW 12.7 11.5 - 15.5 %   Platelets 223 150 - 400 K/uL   nRBC 0.0 0.0 - 0.2 %   Neutrophils Relative % 60 %   Neutro Abs 4.6 1.7 - 7.7 K/uL   Lymphocytes Relative 24 %   Lymphs Abs 1.8 0.7 - 4.0 K/uL   Monocytes Relative 10 %   Monocytes Absolute 0.8 0.1 - 1.0 K/uL   Eosinophils Relative 5 %   Eosinophils Absolute 0.4 0.0 - 0.5 K/uL   Basophils Relative 1 %   Basophils Absolute 0.1 0.0 - 0.1 K/uL   Immature Granulocytes 0 %   Abs Immature Granulocytes 0.02 0.00 - 0.07 K/uL  Basic metabolic panel  Result Value Ref Range   Sodium 139 135 - 145 mmol/L   Potassium 4.0 3.5 - 5.1 mmol/L   Chloride 107 98 - 111 mmol/L   CO2 21 (L) 22 - 32 mmol/L  Glucose, Bld 152 (H) 70 - 99 mg/dL   BUN 18 8 - 23 mg/dL    Creatinine, Ser 0.85 0.61 - 1.24 mg/dL   Calcium 9.0 8.9 - 10.3 mg/dL   GFR calc non Af Amer >60 >60 mL/min   GFR calc Af Amer >60 >60 mL/min   Anion gap 11 5 - 15      Assessment & Plan:   Problem List Items Addressed This Visit    Benign prostatic hyperplasia with incomplete bladder emptying Stable clinically with some BPH LUTS On finasteride and Flomax No current urologist locally, has medicine F/u    Relevant Medications   finasteride (PROSCAR) 5 MG tablet   tamsulosin (FLOMAX) 0.4 MG CAPS capsule   Diabetes mellitus, Type 2 with other specified complication on insulin ([place]) - Primary  Clinically with previously moderately controlled A1c 6-8 in past, last check 2019 in careeverywhere On insulin longterm with other meds Complication with suspected CKD, pending result based on past readings, hyperglycemia in history currently now improved. Obesity, Hyperlipidemia - Re order jardiance 25, continue novolin N, metformin F/u next week for lab A1c chemistry urine tests, f/u 3 months after review results on phone          Relevant Medications   valsartan (DIOVAN) 80 MG tablet   aspirin 81 MG chewable tablet   insulin NPH Human (NOVOLIN N) 100 UNIT/ML injection   empagliflozin (JARDIANCE) 25 MG TABS tablet   atorvastatin (LIPITOR) 10 MG tablet   Morbid obesity (HCC) Encourage weight loss lifestyle Consider GLP1 in future    Relevant Medications   insulin NPH Human (NOVOLIN N) 100 UNIT/ML injection   empagliflozin (JARDIANCE) 25 MG TABS tablet    Other Visit Diagnoses    Hyperlipidemia associated with type 2 diabetes mellitus (HCC)      Reorder statin atorvastatin    Relevant Medications   valsartan (DIOVAN) 80 MG tablet   aspirin 81 MG chewable tablet   insulin NPH Human (NOVOLIN N) 100 UNIT/ML injection   empagliflozin (JARDIANCE) 25 MG TABS tablet   atorvastatin (LIPITOR) 10 MG tablet        Meds ordered this encounter  Medications  . empagliflozin  (JARDIANCE) 25 MG TABS tablet    Sig: Take 25 mg by mouth daily.    Dispense:  90 tablet    Refill:  1  . atorvastatin (LIPITOR) 10 MG tablet    Sig: Take 1 tablet (10 mg total) by mouth daily at 6 PM.    Dispense:  90 tablet    Refill:  1    Future orders placed for 06/04/19 for Chemistry and A1c + Urine tests urinalysis and Urine microalbumin  Follow up plan: Return in about 4 weeks (around 06/27/2019) for DM A1c.  Nobie Putnam, [facility] 05/30/2019, 3:39 PM

## 2019-05-30 NOTE — Patient Instructions (Addendum)
Thank you for coming to the office today.  Refilled Jardiance 4m daily for 90 day with 1 refill  Refilled Atorvastatin 131mdaily as well.  Let me know if need other medicines refilled.  Colon Cancer Screening: - For all adults age 68+outine colon cancer screening is highly recommended.     - Recent guidelines from [employer] starting age of 68 Early detection of colon cancer is important, because often there are no warning signs or symptoms, also if found early usually it can be cured. Late stage is hard to treat.  - If you are not interested in Colonoscopy screening (if done and normal you could be cleared for 5 to 10 years until next due), then Cologuard is an excellent alternative for screening test for Colon Cancer. It is highly sensitive for detecting DNA of colon cancer from even the earliest stages. Also, there is NO bowel prep required. - If Cologuard is NEGATIVE, then it is good for 3 years before next due - If Cologuard is POSITIVE, then it is strongly advised to get a Colonoscopy, which allows the GI doctor to locate the source of the cancer or polyp (even very early stage) and treat it by removing it. ------------------------- If you would like to proceed with Cologuard (stool DNA test) - FIRST, call your insurance company and tell them you want to check cost of Cologuard tell them CPT Code [id number] may be completely covered and you could get for no cost, OR max cost without any coverage is about $600). Also, keep in mind if you do NOT open the kit, and decide not to do the test, you will NOT be charged, you should contact the company if you decide not to do the test. - If you want to proceed, you can notify usKoreaphone message, [place] at next visit) and we will order it for you. The test kit will be delivered to you house within about 1 week. Follow instructions to collect sample, you may call the company for any help or questions, 24/7  telephone support at [phone]  DUE for FASTING BLOOD WORK (no food or drink after midnight before the lab appointment, only water or coffee without cream/sugar on the morning of)  SCHEDULE "Lab Only" visit in the morning at the clinic for lab draw in 1 WEEK  - Make sure Lab Only appointment is at about 1 week before your next appointment, so that results will be available  For Lab Results, once available within 2-3 days of blood draw, you can can log in to MyChart online to view your results and a brief explanation. Also, we can discuss results at next follow-up visit.  Please schedule a Follow-up Appointment to: Return in about 4 weeks (around 06/27/2019) for DM A1c.  If you have any other questions or concerns, please feel free to call the office or send a message through MyWallYou may also schedule an earlier appointment if necessary.  Additionally, you may be receiving a survey about your experience at our office within a few days to 1 week by e-mail or mail. We value your feedback.  AlNobie PutnamDO [facility] [place]

## 2019-06-04 ENCOUNTER — Other Ambulatory Visit: Payer: Medicare HMO

## 2019-06-08 ENCOUNTER — Ambulatory Visit: Payer: Medicare HMO | Admitting: Family Medicine

## 2019-06-12 ENCOUNTER — Telehealth: Payer: Self-pay | Admitting: Family Medicine

## 2019-06-12 NOTE — Telephone Encounter (Signed)
I left a message on both home and mobile numbers asking the patient to schedule AWV with Tiffany after his visit with Dr. Raliegh Ip on 07/03/2019. VDM (DD)

## 2019-07-02 ENCOUNTER — Telehealth: Payer: Self-pay

## 2019-07-02 DIAGNOSIS — N401 Enlarged prostate with lower urinary tract symptoms: Secondary | ICD-10-CM

## 2019-07-02 DIAGNOSIS — R3914 Feeling of incomplete bladder emptying: Secondary | ICD-10-CM

## 2019-07-02 NOTE — Telephone Encounter (Signed)
Patient is demanding to have a urinalysis done tomorrow with his physical labs.  He reported that is old doctor always checked his urine for his kidneys.  Please place order, he is coming tomorrow to get physical labs.  Thank you

## 2019-07-02 NOTE — Telephone Encounter (Signed)
Placed order for Urinalysis to [facility]. Since I cannot order a POC Urine Dipstick test in advance.  Nobie Putnam, [facility] 07/02/2019, 10:25 AM

## 2019-07-03 ENCOUNTER — Other Ambulatory Visit: Payer: Medicare HMO

## 2019-07-03 ENCOUNTER — Ambulatory Visit: Payer: Medicare HMO | Admitting: Family Medicine

## 2019-07-03 ENCOUNTER — Other Ambulatory Visit: Payer: Self-pay

## 2019-07-03 DIAGNOSIS — N401 Enlarged prostate with lower urinary tract symptoms: Secondary | ICD-10-CM

## 2019-07-03 DIAGNOSIS — E1169 Type 2 diabetes mellitus with other specified complication: Secondary | ICD-10-CM | POA: Diagnosis not present

## 2019-07-03 DIAGNOSIS — R3914 Feeling of incomplete bladder emptying: Secondary | ICD-10-CM

## 2019-07-03 DIAGNOSIS — Z794 Long term (current) use of insulin: Secondary | ICD-10-CM | POA: Diagnosis not present

## 2019-07-04 ENCOUNTER — Encounter: Payer: Self-pay | Admitting: Family Medicine

## 2019-07-04 ENCOUNTER — Ambulatory Visit (INDEPENDENT_AMBULATORY_CARE_PROVIDER_SITE_OTHER): Payer: Medicare HMO | Admitting: Family Medicine

## 2019-07-04 VITALS — BP 139/68 | HR 77 | Resp 16 | Ht 70.0 in | Wt 256.0 lb

## 2019-07-04 DIAGNOSIS — Z0000 Encounter for general adult medical examination without abnormal findings: Secondary | ICD-10-CM

## 2019-07-04 DIAGNOSIS — N401 Enlarged prostate with lower urinary tract symptoms: Secondary | ICD-10-CM | POA: Diagnosis not present

## 2019-07-04 DIAGNOSIS — R3914 Feeling of incomplete bladder emptying: Secondary | ICD-10-CM | POA: Diagnosis not present

## 2019-07-04 DIAGNOSIS — E1169 Type 2 diabetes mellitus with other specified complication: Secondary | ICD-10-CM

## 2019-07-04 DIAGNOSIS — I83813 Varicose veins of bilateral lower extremities with pain: Secondary | ICD-10-CM | POA: Diagnosis not present

## 2019-07-04 DIAGNOSIS — Z1211 Encounter for screening for malignant neoplasm of colon: Secondary | ICD-10-CM | POA: Diagnosis not present

## 2019-07-04 DIAGNOSIS — Z794 Long term (current) use of insulin: Secondary | ICD-10-CM | POA: Diagnosis not present

## 2019-07-04 DIAGNOSIS — K635 Polyp of colon: Secondary | ICD-10-CM

## 2019-07-04 LAB — URINALYSIS, ROUTINE W REFLEX MICROSCOPIC
Bilirubin Urine: NEGATIVE
Hgb urine dipstick: NEGATIVE
Ketones, ur: NEGATIVE
Leukocytes,Ua: NEGATIVE
Nitrite: NEGATIVE
Protein, ur: NEGATIVE
Specific Gravity, Urine: 1.031 (ref 1.001–1.03)
pH: <=5 (ref 5.0–8.0)

## 2019-07-04 LAB — COMPLETE METABOLIC PANEL WITH GFR
AG Ratio: 1.6 (calc) (ref 1.0–2.5)
ALT: 15 U/L (ref 9–46)
AST: 11 U/L (ref 10–35)
Albumin: 3.9 g/dL (ref 3.6–5.1)
Alkaline phosphatase (APISO): 60 U/L (ref 35–144)
BUN: 13 mg/dL (ref 7–25)
CO2: 24 mmol/L (ref 20–32)
Calcium: 9 mg/dL (ref 8.6–10.3)
Chloride: 107 mmol/L (ref 98–110)
Creat: 0.85 mg/dL (ref 0.70–1.25)
GFR, Est African American: 104 mL/min/{1.73_m2} (ref >=60)
GFR, Est Non African American: 90 mL/min/{1.73_m2} (ref >=60)
Globulin: 2.5 g/dL (calc) (ref 1.9–3.7)
Glucose, Bld: 106 mg/dL — ABNORMAL HIGH (ref 65–99)
Potassium: 4.1 mmol/L (ref 3.5–5.3)
Sodium: 139 mmol/L (ref 135–146)
Total Bilirubin: 0.6 mg/dL (ref 0.2–1.2)
Total Protein: 6.4 g/dL (ref 6.1–8.1)

## 2019-07-04 LAB — HEMOGLOBIN A1C
Hgb A1c MFr Bld: 8.1 % of total Hgb — ABNORMAL HIGH (ref <5.7)
Mean Plasma Glucose: 186 (calc)
eAG (mmol/L): 10.3 (calc)

## 2019-07-04 NOTE — Progress Notes (Signed)
Subjective:    Patient ID: Jonathan Shelton, male    DOB: 08/09/1951, 68 y.o.   MRN: [id number]  Jonathan Shelton is a 68 y.o. male presenting on 07/04/2019 for Annual Exam   HPI   Here for Annual Physical and Lab Review.  BPH w/ LUTS Reports chronic problem with Difficulty emptying bladder, reduced urine, slow flow, urinary frequency. Previous urology limited results years ago On Flomax 0.4mg  daily (tried x 2 dose in past) On Finasteride 5mg  daily > 1 yr now limited benefit No recent PSA on file.  CHRONIC DM, Type 2: Last result A1c 8.1 CBGs: Avg 80-120 - he has rare reading 60-70s. Meds: Novolin-N 45-60 units nightly, Jardiance 25mg  daily, Metformin 1000mg  BID - History failed Victoza in past, >8-10 yr ago Reports good compliance. Tolerating well w/o side-effects Currently on ARB DM Eye exam next week Denies hypoglycemia, polyuria, visual changes, numbness or tingling.  Varicose Veins / LE Edema Chronic varicose veins bilateral legs, can be tender or sore at times. Not ready to consult with vascular specialist at this time. See prior note for stasis dermatitis .  Recently wisdom tooth removed, oral surgeon, now on amoxicillin  Health Maintenance:  Due for Pneumonia vaccine for diabetes, he declines  Request records from prior PCP, asking about future colonoscopy, need last record. Prior polyps last colonoscopy last done 4 years  Due for PSA - advised that we can add to next test in future, apt today was not originally scheduled for physical and this test was not ordered.  Future Hep C lab, he says has had negative lab in past but we do not have record.   Depression screen [facility] 2/9 07/04/2019 07/04/2019 05/30/2019  Decreased Interest 2 2 0  Down, Depressed, Hopeless 0 0 0  PHQ - 2 Score 2 2 0  Altered sleeping 2 2 -  Tired, decreased energy 2 2 -  Change in appetite 0 0 -  Feeling bad or failure about yourself  0 0 -  Trouble concentrating 0 0 -  Moving slowly or  fidgety/restless 0 0 -  Suicidal thoughts 0 0 -  PHQ-9 Score 6 6 -  Difficult doing work/chores Not difficult at all - -    Past Medical History:  Diagnosis Date  . Allergy   . Renal disorder    Past Surgical History:  Procedure Laterality Date  . CARPAL TUNNEL RELEASE    . CHOLECYSTECTOMY    . HERNIA REPAIR     Social History   Socioeconomic History  . Marital status: Married    Spouse name: Not on file  . Number of children: Not on file  . Years of education: Graduate Degree  . Highest education level: Master's degree (e.g., MA, MS, MEng, MEd, MSW, MBA)  Occupational History  . Not on file  Social Needs  . Financial resource strain: Not on file  . Food insecurity    Worry: Not on file    Inability: Not on file  . Transportation needs    Medical: Not on file    Non-medical: Not on file  Tobacco Use  . Smoking status: Former Smoker    Types: Cigarettes    Quit date: 1970    Years since quitting: 50.6  . Smokeless tobacco: Former Network engineer and Sexual Activity  . Alcohol use: Not Currently  . Drug use: Yes    Comment: past  . Sexual activity: Not on file  Lifestyle  . Physical activity  Days per week: Not on file    Minutes per session: Not on file  . Stress: Not on file  Relationships  . Social Herbalist on phone: Not on file    Gets together: Not on file    Attends religious service: Not on file    Active member of club or organization: Not on file    Attends meetings of clubs or organizations: Not on file    Relationship status: Not on file  . Intimate partner violence    Fear of current or ex partner: Not on file    Emotionally abused: Not on file    Physically abused: Not on file    Forced sexual activity: Not on file  Other Topics Concern  . Not on file  Social History Narrative  . Not on file   Family History  Problem Relation Age of Onset  . Heart disease Mother 87  . Heart disease Father   . Stroke Father 53  .  Diabetes Father   . Heart attack Father    Current Outpatient Medications on File Prior to Visit  Medication Sig  . amoxicillin (AMOXIL) 500 MG capsule Take 500 mg by mouth 3 (three) times daily.  Marland Kitchen aspirin 81 MG chewable tablet Chew by mouth.  Marland Kitchen atorvastatin (LIPITOR) 10 MG tablet Take 1 tablet (10 mg total) by mouth daily at 6 PM.  . CRANBERRY FRUIT PO Take by mouth.  . empagliflozin (JARDIANCE) 25 MG TABS tablet Take 25 mg by mouth daily.  . finasteride (PROSCAR) 5 MG tablet TAKE ONE TABLET (5 MG DOSE) BY MOUTH DAILY.  . hydrOXYzine (ATARAX/VISTARIL) 50 MG tablet Take 1 tablet (50 mg total) by mouth 3 (three) times daily as needed for itching.  . insulin NPH Human (NOVOLIN N) 100 UNIT/ML injection Inject 0.45-0.6 mLs (45-60 Units total) into the skin at bedtime.  Marland Kitchen losartan (COZAAR) 50 MG tablet Take 50 mg by mouth daily.  . metFORMIN (GLUCOPHAGE) 1000 MG tablet Take 1,000 mg by mouth 2 (two) times daily with a meal.  . tamsulosin (FLOMAX) 0.4 MG CAPS capsule TAKE ONE CAPSULE (0.4 MG DOSE) BY MOUTH DAILY.  Marland Kitchen triamcinolone cream (KENALOG) 0.1 % APPLY TO AFFECTED AREA TWICE A DAY AS NEEDED  . valsartan (DIOVAN) 80 MG tablet   . vitamin E 400 UNIT capsule Take 400 Units by mouth daily.   No current facility-administered medications on file prior to visit.     Review of Systems  Constitutional: Negative for activity change, appetite change, chills, diaphoresis, fatigue and fever.  HENT: Negative for congestion and hearing loss.   Eyes: Negative for visual disturbance.  Respiratory: Negative for apnea, cough, chest tightness, shortness of breath and wheezing.   Cardiovascular: Negative for chest pain, palpitations and leg swelling.  Gastrointestinal: Negative for abdominal pain, anal bleeding, blood in stool, constipation, diarrhea, nausea and vomiting.  Endocrine: Negative for cold intolerance.  Genitourinary: Negative for difficulty urinating, dysuria, frequency and hematuria.   Musculoskeletal: Negative for arthralgias, back pain and neck pain.  Skin: Negative for rash.  Allergic/Immunologic: Negative for environmental allergies.  Neurological: Negative for dizziness, weakness, light-headedness, numbness and headaches.  Hematological: Negative for adenopathy.  Psychiatric/Behavioral: Negative for behavioral problems, dysphoric mood and sleep disturbance. The patient is not nervous/anxious.    Per HPI unless specifically indicated above      Objective:    BP 139/68   Pulse 77   Resp 16   Ht 5\' 10"  (1.778 m)  Wt 256 lb (116.1 kg)   SpO2 96%   BMI 36.73 kg/m   Wt Readings from Last 3 Encounters:  07/04/19 256 lb (116.1 kg)  05/30/19 252 lb (114.3 kg)  05/10/19 250 lb (113.4 kg)    Physical Exam Vitals signs and nursing note reviewed.  Constitutional:      General: He is not in acute distress.    Appearance: He is well-developed. He is not diaphoretic.     Comments: Well-appearing, comfortable, cooperative, obesity  HENT:     Head: Normocephalic and atraumatic.  Eyes:     General:        Right eye: No discharge.        Left eye: No discharge.     Conjunctiva/sclera: Conjunctivae normal.     Pupils: Pupils are equal, round, and reactive to light.  Neck:     Musculoskeletal: Normal range of motion and neck supple.     Thyroid: No thyromegaly.     Comments: No carotid bruits Cardiovascular:     Rate and Rhythm: Normal rate and regular rhythm.     Heart sounds: Normal heart sounds. No murmur.  Pulmonary:     Effort: Pulmonary effort is normal. No respiratory distress.     Breath sounds: Normal breath sounds. No wheezing or rales.  Abdominal:     General: Bowel sounds are normal. There is no distension.     Palpations: Abdomen is soft. There is no mass.     Tenderness: There is no abdominal tenderness.  Musculoskeletal: Normal range of motion.        General: No tenderness.     Comments: Upper / Lower Extremities: - Normal muscle tone,  strength bilateral upper extremities 5/5, lower extremities 5/5  Lymphadenopathy:     Cervical: No cervical adenopathy.  Skin:    General: Skin is warm and dry.     Findings: No erythema or rash.  Neurological:     Mental Status: He is alert and oriented to person, place, and time.     Comments: Distal sensation intact to light touch all extremities  Psychiatric:        Behavior: Behavior normal.     Comments: Well groomed, good eye contact, normal speech and thoughts    Diabetic Foot Exam - Simple   Simple Foot Form Diabetic Foot exam was performed with the following findings: Yes 07/04/2019  9:45 AM  Visual Inspection See comments: Yes Sensation Testing Intact to touch and monofilament testing bilaterally: Yes Pulse Check Posterior Tibialis and Dorsalis pulse intact bilaterally: Yes Comments Mild callus formation bilateral feet. No ulceration.      Results for orders placed or performed in visit on 07/03/19  Urinalysis, Routine w reflex microscopic  Result Value Ref Range   Color, Urine YELLOW YELLOW   APPearance CLEAR CLEAR   Specific Gravity, Urine 1.031 1.001 - 1.03   pH < OR = 5.0 5.0 - 8.0   Glucose, UA 3+ (A) NEGATIVE   Bilirubin Urine NEGATIVE NEGATIVE   Ketones, ur NEGATIVE NEGATIVE   Hgb urine dipstick NEGATIVE NEGATIVE   Protein, ur NEGATIVE NEGATIVE   Nitrite NEGATIVE NEGATIVE   Leukocytes,Ua NEGATIVE NEGATIVE  COMPLETE METABOLIC PANEL WITH GFR  Result Value Ref Range   Glucose, Bld 106 (H) 65 - 99 mg/dL   BUN 13 7 - 25 mg/dL   Creat 0.85 0.70 - 1.25 mg/dL   GFR, Est Non African American 90 > OR = 60 mL/min/1.41m2   GFR, Est African  American 104 > OR = 60 mL/min/1.88m2   BUN/Creatinine Ratio NOT APPLICABLE 6 - 22 (calc)   Sodium 139 135 - 146 mmol/L   Potassium 4.1 3.5 - 5.3 mmol/L   Chloride 107 98 - 110 mmol/L   CO2 24 20 - 32 mmol/L   Calcium 9.0 8.6 - 10.3 mg/dL   Total Protein 6.4 6.1 - 8.1 g/dL   Albumin 3.9 3.6 - 5.1 g/dL   Globulin 2.5 1.9  - 3.7 g/dL (calc)   AG Ratio 1.6 1.0 - 2.5 (calc)   Total Bilirubin 0.6 0.2 - 1.2 mg/dL   Alkaline phosphatase (APISO) 60 35 - 144 U/L   AST 11 10 - 35 U/L   ALT 15 9 - 46 U/L  Hemoglobin A1c  Result Value Ref Range   Hgb A1c MFr Bld 8.1 (H) <5.7 % of total Hgb   Mean Plasma Glucose 186 (calc)   eAG (mmol/L) 10.3 (calc)      Assessment & Plan:   Problem List Items Addressed This Visit    Benign prostatic hyperplasia with incomplete bladder emptying    Stable chronic BPH with LUTS without obstruction - AUA BPH score elevated - On Flomax 0.4mg , Finasteride 5mg , failed flomax 0.8 - Last PSA not on file - No known personal/family history of prostate CA  Plan: 1. Continue current regimen Tamsulosin 0.4mg  daily, Finasteride 5mg  daily - Advised future consider refer to Urology when ready consider procedural intervention if need symptom relief - ADD PSA to future labs as discussed for screening      Morbid obesity ([place])    Encourage improve lifestyle diet exercise Start GLP1 and reduce insulin      Type 2 diabetes mellitus with other specified complication (HCC)    Uncontrolled DM with hyperglycemia and some labile blood sugars, W0J 8.1 Complications - some early peripheral neuropathy, obesity - increases risk of future cardiovascular complications   Concern wt gain on insulin and hypoglycemia  Plan:  1. START GLP1 trial sample given Ozempic 0.25mg  weekly x 4 weeks then inc to 0.5mg  weekly x 2 weeks for first sample, notify us if can check cost/coverage will place order, reviewed benefits risks, suspect he will greatly benefit from A1c control, reduced hypoglycemia, wt loss, and cardiovascular risk reduction - TAPER down on NPH insulin instead of 45-60, see AVS, go down by about 10 units on avg, then keep decreasing by 2-5 units every 1 week if fasting CBG < 150, advised if significant hypoglycemia or other acute concerns call office, can reduce much quicker, goal to transition OFF  NPH in future if can titrate to max dose ozempic in future - Continue Metformin 1000mg  BID - Continue Jardiance 25mg  daily 2. Encourage improved lifestyle - low carb, low sugar diet, reduce portion size, continue improving regular exercise 3. Check CBG, bring log to next visit for review 4. Continue ASA, ARB (still med rec question regarding ARBs he will need to check), Statin 5. DM Foot exam done today / Advised to schedule DM ophtho exam, send record 6. Follow-up 3 months       Varicose veins of bilateral lower extremities with pain    Chronic stable problem, bilateral LE Consider future vascular referral when ready       Other Visit Diagnoses    Annual physical exam    -  Primary   Screening for colon cancer       Relevant Orders   Cologuard   Polyp of colon, unspecified part of  colon, unspecified type       Relevant Orders   Cologuard     Updated Health Maintenance information  - Due for routine colon cancer screening. See HPI - Discussion today about recommendations for either Colonoscopy or Cologuard screening, benefits and risks of screening, interested in Cologuard, understands that if positive then recommendation is for diagnostic colonoscopy to follow-up. - Ordered Cologuard today  Reviewed recent lab results with patient Encouraged improvement to lifestyle with diet and exercise - Goal of weight loss    No orders of the defined types were placed in this encounter.   Follow up plan: Return in about 3 months (around 10/04/2019) for DM A1c.  Nobie Putnam, [facility] 07/04/2019, 9:33 AM

## 2019-07-04 NOTE — Patient Instructions (Addendum)
Thank you for coming to the office today.  Reduce Insulin by about 10 to 15 units down to start - after 2 weeks can reduce by about 2 to units 5 every week as long as average fasting sugar in morning is < 150 on average.  If getting < 100 on average can call us for advice or drop insulin even further down by 10+ units each week.  Call insurance find cost and coverage of the following  1. Ozempic (Semaglutide injection) - start 0.25mg  weekly for 4 weeks then increase to 0.5mg  weekly - This one has best benefit of weight loss and reducing Cardiovascular events (SAMPLE GIVEN TODAY)  2. Bydureon BCise (Exenatide ER) - once weekly - this is my preference, very good medicine well tolerated, less side effects of nausea, upset stomach. No dose changes. Cost and coverage is the problem, but we may be able to get it with the coupon card  3. Trulicity (Dulaglutide) - once weekly - this is very good one, usually one of my top choices as well, two doses, 0.75 (likely we would start) and 1.5 max dose. We can use coupon card here too  4. Victoza (Liraglutide) - once DAILY - 3 dose changes 0.6, 1.2 and 1.8, side effects nausea, upset stomach higher on this one but it is still very effective medicine  -----------------------------------  Consider Urologist in future for possible procedural intervention if needed.  You are already on Flomax and Finasteride, limited other med options at this time.    Please schedule a Follow-up Appointment to: Return in about 3 months (around 10/04/2019) for DM A1c.  If you have any other questions or concerns, please feel free to call the office or send a message through [facility]. You may also schedule an earlier appointment if necessary.  Additionally, you may be receiving a survey about your experience at our office within a few days to 1 week by e-mail or mail. We value your feedback.  Nobie Putnam, DO [place]

## 2019-07-04 NOTE — Assessment & Plan Note (Signed)
Chronic stable problem, bilateral LE Consider future vascular referral when ready

## 2019-07-04 NOTE — Assessment & Plan Note (Signed)
Encourage improve lifestyle diet exercise Start GLP1 and reduce insulin

## 2019-07-04 NOTE — Assessment & Plan Note (Signed)
Uncontrolled DM with hyperglycemia and some labile blood sugars, Y1R 8.1 Complications - some early peripheral neuropathy, obesity - increases risk of future cardiovascular complications   Concern wt gain on insulin and hypoglycemia  Plan:  1. START GLP1 trial sample given Ozempic 0.25mg  weekly x 4 weeks then inc to 0.5mg  weekly x 2 weeks for first sample, notify us if can check cost/coverage will place order, reviewed benefits risks, suspect he will greatly benefit from A1c control, reduced hypoglycemia, wt loss, and cardiovascular risk reduction - TAPER down on NPH insulin instead of 45-60, see AVS, go down by about 10 units on avg, then keep decreasing by 2-5 units every 1 week if fasting CBG < 150, advised if significant hypoglycemia or other acute concerns call office, can reduce much quicker, goal to transition OFF NPH in future if can titrate to max dose ozempic in future - Continue Metformin 1000mg  BID - Continue Jardiance 25mg  daily 2. Encourage improved lifestyle - low carb, low sugar diet, reduce portion size, continue improving regular exercise 3. Check CBG, bring log to next visit for review 4. Continue ASA, ARB (still med rec question regarding ARBs he will need to check), Statin 5. DM Foot exam done today / Advised to schedule DM ophtho exam, send record 6. Follow-up 3 months

## 2019-07-04 NOTE — Assessment & Plan Note (Signed)
Stable chronic BPH with LUTS without obstruction - AUA BPH score elevated - On Flomax 0.4mg , Finasteride 5mg , failed flomax 0.8 - Last PSA not on file - No known personal/family history of prostate CA  Plan: 1. Continue current regimen Tamsulosin 0.4mg  daily, Finasteride 5mg  daily - Advised future consider refer to Urology when ready consider procedural intervention if need symptom relief - ADD PSA to future labs as discussed for screening

## 2019-07-09 DIAGNOSIS — K635 Polyp of colon: Secondary | ICD-10-CM | POA: Diagnosis not present

## 2019-07-09 DIAGNOSIS — Z1211 Encounter for screening for malignant neoplasm of colon: Secondary | ICD-10-CM | POA: Diagnosis not present

## 2019-07-10 DIAGNOSIS — E119 Type 2 diabetes mellitus without complications: Secondary | ICD-10-CM | POA: Diagnosis not present

## 2019-07-10 DIAGNOSIS — H04123 Dry eye syndrome of bilateral lacrimal glands: Secondary | ICD-10-CM | POA: Diagnosis not present

## 2019-07-10 DIAGNOSIS — Z961 Presence of intraocular lens: Secondary | ICD-10-CM | POA: Diagnosis not present

## 2019-07-10 LAB — HM DIABETES EYE EXAM

## 2019-07-12 ENCOUNTER — Encounter: Payer: Self-pay | Admitting: Family Medicine

## 2019-07-16 LAB — COLOGUARD
Cologuard: NEGATIVE
Cologuard: NEGATIVE

## 2019-07-17 ENCOUNTER — Telehealth: Payer: Self-pay | Admitting: Family Medicine

## 2019-07-17 ENCOUNTER — Encounter: Payer: Self-pay | Admitting: Family Medicine

## 2019-07-17 NOTE — Telephone Encounter (Signed)
Please notify patient of Cologuard result:   It is NEGATIVE. This is a good result, meaning that it is very unlikely to have any abnormal colon polyps or colon cancer.   Next due for cologuard test in 3 years - 06/2022  Nobie Putnam, [facility] 07/17/2019, 12:58 PM

## 2019-07-17 NOTE — Telephone Encounter (Signed)
Patient advised.

## 2019-07-27 DIAGNOSIS — E119 Type 2 diabetes mellitus without complications: Secondary | ICD-10-CM | POA: Diagnosis not present

## 2019-08-16 ENCOUNTER — Telehealth: Payer: Self-pay

## 2019-08-16 DIAGNOSIS — E1169 Type 2 diabetes mellitus with other specified complication: Secondary | ICD-10-CM

## 2019-08-16 DIAGNOSIS — Z794 Long term (current) use of insulin: Secondary | ICD-10-CM

## 2019-08-16 MED ORDER — OZEMPIC (0.25 OR 0.5 MG/DOSE) 2 MG/1.5ML ~~LOC~~ SOPN: 0.5000 mg | PEN_INJECTOR | SUBCUTANEOUS | 4 refills | Status: DC

## 2019-08-16 NOTE — Telephone Encounter (Signed)
Rx sent for 90 day supply.

## 2019-08-16 NOTE — Telephone Encounter (Signed)
The pt called requesting that you send a prescription of Ozempic. He was given a sample at his last visit.  START GLP1 trial sample given Ozempic 0.25mg  weekly x 4 weeks then inc to 0.5mg  weekly x 2 weeks for first sample

## 2019-08-20 ENCOUNTER — Telehealth: Payer: Self-pay | Admitting: Family Medicine

## 2019-08-20 NOTE — Telephone Encounter (Signed)
Looks like Lauren ordered ozempic x 3 pens for him, which is a 90 day or 3 month supply, it should be 1 pen per moth.  She ordered this on 08/16/19. He should check with [facility] first to see if they can fill this rx for him.  Nobie Putnam, [facility] 08/20/2019, 12:52 PM

## 2019-08-20 NOTE — Telephone Encounter (Signed)
Pt called requesting a 90 day supply of ozempic said it was cheaper than a 30 day  supply

## 2019-09-04 ENCOUNTER — Ambulatory Visit (INDEPENDENT_AMBULATORY_CARE_PROVIDER_SITE_OTHER): Payer: Medicare HMO

## 2019-09-04 ENCOUNTER — Other Ambulatory Visit: Payer: Self-pay

## 2019-09-04 ENCOUNTER — Ambulatory Visit: Payer: Medicare HMO | Admitting: Family Medicine

## 2019-09-04 DIAGNOSIS — Z23 Encounter for immunization: Secondary | ICD-10-CM | POA: Diagnosis not present

## 2019-09-13 ENCOUNTER — Other Ambulatory Visit: Payer: Self-pay | Admitting: Family Medicine

## 2019-09-13 DIAGNOSIS — E1169 Type 2 diabetes mellitus with other specified complication: Secondary | ICD-10-CM

## 2019-09-13 DIAGNOSIS — Z794 Long term (current) use of insulin: Secondary | ICD-10-CM

## 2019-09-13 MED ORDER — METFORMIN HCL 1000 MG PO TABS: 1000.0000 mg | ORAL_TABLET | Freq: Two times a day (BID) | ORAL | 1 refills | Status: DC

## 2019-09-13 NOTE — Telephone Encounter (Signed)
Pt called requesting refill on  metformin °

## 2019-09-14 ENCOUNTER — Other Ambulatory Visit: Payer: Self-pay

## 2019-10-08 ENCOUNTER — Ambulatory Visit (INDEPENDENT_AMBULATORY_CARE_PROVIDER_SITE_OTHER): Payer: Medicare HMO | Admitting: Family Medicine

## 2019-10-08 ENCOUNTER — Encounter: Payer: Self-pay | Admitting: Family Medicine

## 2019-10-08 ENCOUNTER — Other Ambulatory Visit: Payer: Self-pay

## 2019-10-08 VITALS — BP 121/66 | HR 81 | Temp 98.2°F | Resp 16 | Ht 70.0 in | Wt 244.6 lb

## 2019-10-08 DIAGNOSIS — I83813 Varicose veins of bilateral lower extremities with pain: Secondary | ICD-10-CM

## 2019-10-08 DIAGNOSIS — Z794 Long term (current) use of insulin: Secondary | ICD-10-CM | POA: Diagnosis not present

## 2019-10-08 DIAGNOSIS — R3914 Feeling of incomplete bladder emptying: Secondary | ICD-10-CM

## 2019-10-08 DIAGNOSIS — E1169 Type 2 diabetes mellitus with other specified complication: Secondary | ICD-10-CM | POA: Diagnosis not present

## 2019-10-08 DIAGNOSIS — N401 Enlarged prostate with lower urinary tract symptoms: Secondary | ICD-10-CM | POA: Diagnosis not present

## 2019-10-08 DIAGNOSIS — I10 Essential (primary) hypertension: Secondary | ICD-10-CM | POA: Diagnosis not present

## 2019-10-08 LAB — POCT GLYCOSYLATED HEMOGLOBIN (HGB A1C): Hemoglobin A1C: 7.3 % — AB (ref 4.0–5.6)

## 2019-10-08 MED ORDER — VALSARTAN 80 MG PO TABS: 80.0000 mg | ORAL_TABLET | Freq: Every day | ORAL | 1 refills | Status: DC

## 2019-10-08 MED ORDER — TAMSULOSIN HCL 0.4 MG PO CAPS: 0.4000 mg | ORAL_CAPSULE | Freq: Every day | ORAL | 1 refills | Status: DC

## 2019-10-08 MED ORDER — FINASTERIDE 5 MG PO TABS: 5.0000 mg | ORAL_TABLET | Freq: Every day | ORAL | 1 refills | Status: DC

## 2019-10-08 MED ORDER — OZEMPIC (1 MG/DOSE) 2 MG/1.5ML ~~LOC~~ SOPN: 1.0000 mg | PEN_INJECTOR | SUBCUTANEOUS | 5 refills | Status: DC

## 2019-10-08 MED ORDER — LOSARTAN POTASSIUM 50 MG PO TABS: 50.0000 mg | ORAL_TABLET | Freq: Every day | ORAL | 1 refills | Status: DC

## 2019-10-08 NOTE — Patient Instructions (Addendum)
Thank you for coming to the office today.  Increased ozempic from 0.5 up to 1mg  - dose weekly, 1 pen lasts 2 weeks. New order placed, finish old med first.  Only take Valsartan, do not take Losartan.  [facility] [place], [place] [address]  Main: [phone]   Will let them know about Dr Willette Brace records.  Please schedule a Follow-up Appointment to: Return in about 4 months (around 02/05/2020) for 4 month follow-up DM A1c.  If you have any other questions or concerns, please feel free to call the office or send a message through [place]. You may also schedule an earlier appointment if necessary.  Additionally, you may be receiving a survey about your experience at our office within a few days to 1 week by e-mail or mail. We value your feedback.  Nobie Putnam, DO [place]

## 2019-10-08 NOTE — Assessment & Plan Note (Signed)
Stable chronic BPH with LUTS without obstruction - On Flomax 0.4mg , Finasteride 5mg , failed flomax 0.8 - Last PSA not on file - No known personal/family history of prostate CA  Plan: 1. Continue current regimen Tamsulosin 0.4mg  daily, Finasteride 5mg  daily - REFILLED - Advised future consider refer to Urology when ready consider procedural intervention if need symptom relief

## 2019-10-08 NOTE — Assessment & Plan Note (Signed)
Improved DM control with A1c down to 7.3 now on GLp1 Complications - some early peripheral neuropathy, obesity - increases risk of future cardiovascular complications   Plan:  1. Increase Ozempic from 0.5 up to 1mg  now weekly inj - new rx sent - Continue to TAPER down on NPH insulin instead of 40-45 keep reducing dose as advised previously. Keep decreasing by 2-5 units every 1 week if fasting CBG < 150, advised if significant hypoglycemia or other acute concerns call office, can reduce much quicker, goal to transition OFF NPH in future if can titrate to max dose ozempic in future - Continue Metformin 1000mg  BID - Continue Jardiance 25mg  daily 2. Encourage improved lifestyle - low carb, low sugar diet, reduce portion size, continue improving regular exercise 3. Check CBG, bring log to next visit for review 4. Continue ASA, ARB, Statin - UTD DM eye and foot 5. Follow-up 4 months

## 2019-10-08 NOTE — Progress Notes (Signed)
Subjective:    Patient ID: Jonathan Shelton, male    DOB: December 07, 1950, 68 y.o.   MRN: [id number]  Jonathan Shelton is a 68 y.o. male presenting on 10/08/2019 for Diabetes (as per patient he is coughing 4 time an hour due to allergies clear mucus but denies covid related symptoms )   HPI   CHRONIC DM, Type 2: Last result A1c 8.1 now improved to 7.3 today on A1c, on ozempic doing well, weight loss CBGs: Avg90-125, rare low sugar, PM sugar 200-250 Meds:Novolin-N 40-45 units nightly, Ozempic 0.5mg  weekly, Jardiance 25mg  daily, Metformin 1000mg  BID Reports good compliance. Tolerating well w/o side-effects Currently on ARB DM Eye exam done by Dr Ellin Mayhew 07/10/19 Denies hypoglycemia, polyuria, visual changes, numbness or tingling.  CHRONIC HTN: Reports no new concerns. Current Meds - Valsartan 80mg  daily needs refill   Reports good compliance, took meds today. Tolerating well, w/o complaints. Denies CP, dyspnea, HA, edema, dizziness / lightheadedness   BPH LUTS Chronic problem. He is doing well on current meds. Needs refill Finasteride, Tamsulosin  Varicose Veins Chronic episodic swelling with pain at times, has persistent varicose veins protruding bilateral lower extremity. He saw vascular through [facility] back in 04/2018 had limited success, tried compression. He would like to see other vascular specialist now locally.  Depression screen [facility] 2/9 10/08/2019 07/04/2019 07/04/2019  Decreased Interest 0 2 2  Down, Depressed, Hopeless 0 0 0  PHQ - 2 Score 0 2 2  Altered sleeping - 2 2  Tired, decreased energy - 2 2  Change in appetite - 0 0  Feeling bad or failure about yourself  - 0 0  Trouble concentrating - 0 0  Moving slowly or fidgety/restless - 0 0  Suicidal thoughts - 0 0  PHQ-9 Score - 6 6  Difficult doing work/chores - Not difficult at all -    Social History   Tobacco Use  . Smoking status: Former Smoker    Types: Cigarettes    Quit date: 1970    Years since quitting:  50.9  . Smokeless tobacco: Former Network engineer Use Topics  . Alcohol use: Not Currently  . Drug use: Yes    Comment: past    Review of Systems Per HPI unless specifically indicated above     Objective:    BP 121/66   Pulse 81   Temp 98.2 F (36.8 C) (Oral)   Resp 16   Ht 5\' 10"  (1.778 m)   Wt 244 lb 9.6 oz (110.9 kg)   BMI 35.10 kg/m   Wt Readings from Last 3 Encounters:  10/08/19 244 lb 9.6 oz (110.9 kg)  07/04/19 256 lb (116.1 kg)  05/30/19 252 lb (114.3 kg)    Physical Exam Vitals signs and nursing note reviewed.  Constitutional:      General: He is not in acute distress.    Appearance: He is well-developed. He is not diaphoretic.     Comments: Well-appearing, comfortable, cooperative  HENT:     Head: Normocephalic and atraumatic.  Eyes:     General:        Right eye: No discharge.        Left eye: No discharge.     Conjunctiva/sclera: Conjunctivae normal.  Cardiovascular:     Rate and Rhythm: Normal rate.  Pulmonary:     Effort: Pulmonary effort is normal.  Musculoskeletal:     Right lower leg: Edema (varicose veins) present.     Left lower leg: Edema (varicose veins) present.  Skin:    General: Skin is warm and dry.     Coloration: Skin is not pale.     Findings: No erythema or rash.  Neurological:     Mental Status: He is alert and oriented to person, place, and time.  Psychiatric:        Behavior: Behavior normal.     Comments: Well groomed, good eye contact, normal speech and thoughts       X-ray knee right 3 views6/27/2019 [place] Other Result Information  This result has an attachment that is not available.  Result Narrative  Imaging Studies: AP, lateral, standing and sunrise x-rays of the right knee were ordered  and personally reviewed today. These show complete loss of medial joint  space with some osteophyte formation laterally. The lateral view shows  minimal posterior osteophytes and large patellofemoral  osteophytes. The  sunrise view shows advanced patellofemoral arthritis with osteophyte  formation medially and laterally.    X-ray Impression: Advanced tricompartmental osteoarthritis of bilateral knees equally  affected   Recent Labs    07/03/19 0908 10/08/19 1128  HGBA1C 8.1* 7.3*      Results for orders placed or performed in visit on 10/08/19  POCT HgB A1C  Result Value Ref Range   Hemoglobin A1C 7.3 (A) 4.0 - 5.6 %      Assessment & Plan:   Problem List Items Addressed This Visit    Varicose veins of bilateral lower extremities with pain   Relevant Medications   valsartan (DIOVAN) 80 MG tablet   Other Relevant Orders   Ambulatory referral to Vascular Surgery   Type 2 diabetes mellitus with other specified complication ([place]) - Primary    Improved DM control with A1c down to 7.3 now on GLp1 Complications - some early peripheral neuropathy, obesity - increases risk of future cardiovascular complications   Plan:  1. Increase Ozempic from 0.5 up to 1mg  now weekly inj - new rx sent - Continue to TAPER down on NPH insulin instead of 40-45 keep reducing dose as advised previously. Keep decreasing by 2-5 units every 1 week if fasting CBG < 150, advised if significant hypoglycemia or other acute concerns call office, can reduce much quicker, goal to transition OFF NPH in future if can titrate to max dose ozempic in future - Continue Metformin 1000mg  BID - Continue Jardiance 25mg  daily 2. Encourage improved lifestyle - low carb, low sugar diet, reduce portion size, continue improving regular exercise 3. Check CBG, bring log to next visit for review 4. Continue ASA, ARB, Statin - UTD DM eye and foot 5. Follow-up 4 months       Relevant Medications   OZEMPIC, 1 MG/DOSE, 2 MG/1.5ML SOPN   valsartan (DIOVAN) 80 MG tablet   Other Relevant Orders   POCT HgB A1C (Completed)   Benign prostatic hyperplasia with incomplete bladder emptying    Stable chronic BPH with LUTS  without obstruction - On Flomax 0.4mg , Finasteride 5mg , failed flomax 0.8 - Last PSA not on file - No known personal/family history of prostate CA  Plan: 1. Continue current regimen Tamsulosin 0.4mg  daily, Finasteride 5mg  daily - REFILLED - Advised future consider refer to Urology when ready consider procedural intervention if need symptom relief      Relevant Medications   finasteride (PROSCAR) 5 MG tablet   tamsulosin (FLOMAX) 0.4 MG CAPS capsule    Other Visit Diagnoses    Essential hypertension       Relevant Medications  valsartan (DIOVAN) 80 MG tablet    #HTN - initially sent losartan / valsartan, requested that staff contact pharmacy to DC Losartan rx. Confirmed with patient that Valsartan is monotherapy, advised him verbally as well should only be on valsartan.    #Varicose veins Referral to [facility] for chronic symptomatic varicose veins with pain and swelling, bilateral lower extremity. Patient was previously seen through [facility] - Dr Ulanda Edison in 04/2018, please request record or access it on CareEverywhere as patient requests these to be reviewed by Vascular prior to his appointment, he has changed his care to [facility] instead of [facility] and would like to pick up where they left. He is interested in more definitive treatment for varicose veins  Orders Placed This Encounter  Procedures  . Ambulatory referral to Vascular Surgery    Referral Priority:   Routine    Referral Type:   Surgical    Referral Reason:   Specialty Services Required    Requested Specialty:   Vascular Surgery    Number of Visits Requested:   1  . POCT HgB A1C    Meds ordered this encounter  Medications  . OZEMPIC, 1 MG/DOSE, 2 MG/1.5ML SOPN    Sig: Inject 1 mg into the skin once a week.    Dispense:  2 pen    Refill:  5  . valsartan (DIOVAN) 80 MG tablet    Sig: Take 1 tablet (80 mg total) by mouth daily.    Dispense:  90 tablet    Refill:  1  . finasteride  (PROSCAR) 5 MG tablet    Sig: Take 1 tablet (5 mg total) by mouth daily.    Dispense:  90 tablet    Refill:  1  . tamsulosin (FLOMAX) 0.4 MG CAPS capsule    Sig: Take 1 capsule (0.4 mg total) by mouth daily after supper.    Dispense:  90 capsule    Refill:  1  . DISCONTD: losartan (COZAAR) 50 MG tablet    Sig: Take 1 tablet (50 mg total) by mouth daily.    Dispense:  90 tablet    Refill:  1     Follow up plan: Return in about 4 months (around 02/05/2020) for 4 month follow-up DM A1c.   Nobie Putnam, DO [facility] 10/08/2019, 11:25 AM

## 2019-10-24 ENCOUNTER — Telehealth: Payer: Self-pay | Admitting: Family Medicine

## 2019-10-24 DIAGNOSIS — R05 Cough: Secondary | ICD-10-CM | POA: Diagnosis not present

## 2019-10-24 NOTE — Telephone Encounter (Signed)
Please let him know that we can do a virtual telephone brief visit in order to treat his bronchitis.  Nobie Putnam, DO [facility] 10/24/2019, 10:13 AM

## 2019-10-24 NOTE — Telephone Encounter (Signed)
Pt said that he have bronchitis and requesting you to cal in a zpack or amoxicillin. I did explain to pt that he need appt insisted that I send you a  message

## 2019-10-25 ENCOUNTER — Telehealth: Payer: Self-pay | Admitting: Family Medicine

## 2019-10-25 ENCOUNTER — Other Ambulatory Visit: Payer: Self-pay

## 2019-10-25 DIAGNOSIS — E119 Type 2 diabetes mellitus without complications: Secondary | ICD-10-CM | POA: Diagnosis not present

## 2019-10-25 DIAGNOSIS — Z20822 Contact with and (suspected) exposure to covid-19: Secondary | ICD-10-CM

## 2019-10-25 DIAGNOSIS — J209 Acute bronchitis, unspecified: Secondary | ICD-10-CM | POA: Diagnosis not present

## 2019-10-25 NOTE — Telephone Encounter (Signed)
Pt is requesting a a  Call back  [phone]

## 2019-10-25 NOTE — Telephone Encounter (Signed)
Patient has spoken to [place].

## 2019-10-25 NOTE — Telephone Encounter (Signed)
The pt was notified of Dr. Parks Ranger recommendation. He was very upset stating he doesn't understanding why the provider cannot prescribe a simple abx especially since he is unable to see him in the office. He state that he have these same symptoms every year around this time. He is planning to go get tested today to rule out COVID-19. He also state that he's currently been taking some amoxicillin he had left over from August, but he only had 3 days worth.

## 2019-10-25 NOTE — Telephone Encounter (Signed)
Acknowledged.  I am aware he has requested for antibiotics before in similar situation without being evaluated. I have tried explaining that I do not have problem prescribing the treatment he needs, but I would ask that he is evaluated and seen first. In this circumstance we are doing virtual visits now for sick symptoms, and I would be able to treat him virtually over phone.  However if he plans to go to Urgent care that is fine.  Nobie Putnam, [facility] 10/25/2019, 12:55 PM

## 2019-10-27 LAB — NOVEL CORONAVIRUS, NAA: SARS-CoV-2, NAA: NOT DETECTED

## 2019-11-06 ENCOUNTER — Other Ambulatory Visit (INDEPENDENT_AMBULATORY_CARE_PROVIDER_SITE_OTHER): Payer: Self-pay | Admitting: Nurse Practitioner

## 2019-11-06 DIAGNOSIS — I83813 Varicose veins of bilateral lower extremities with pain: Secondary | ICD-10-CM

## 2019-11-20 ENCOUNTER — Encounter (INDEPENDENT_AMBULATORY_CARE_PROVIDER_SITE_OTHER): Payer: Self-pay | Admitting: Nurse Practitioner

## 2019-11-20 ENCOUNTER — Ambulatory Visit (INDEPENDENT_AMBULATORY_CARE_PROVIDER_SITE_OTHER): Payer: Medicare HMO

## 2019-11-20 ENCOUNTER — Ambulatory Visit (INDEPENDENT_AMBULATORY_CARE_PROVIDER_SITE_OTHER): Payer: Medicare HMO | Admitting: Nurse Practitioner

## 2019-11-20 ENCOUNTER — Other Ambulatory Visit: Payer: Self-pay

## 2019-11-20 VITALS — BP 145/68 | HR 88 | Resp 16 | Ht 70.0 in | Wt 242.0 lb

## 2019-11-20 DIAGNOSIS — I83813 Varicose veins of bilateral lower extremities with pain: Secondary | ICD-10-CM | POA: Diagnosis not present

## 2019-11-20 DIAGNOSIS — E1169 Type 2 diabetes mellitus with other specified complication: Secondary | ICD-10-CM | POA: Diagnosis not present

## 2019-11-20 DIAGNOSIS — Z794 Long term (current) use of insulin: Secondary | ICD-10-CM

## 2019-11-25 ENCOUNTER — Encounter (INDEPENDENT_AMBULATORY_CARE_PROVIDER_SITE_OTHER): Payer: Self-pay | Admitting: Nurse Practitioner

## 2019-11-25 NOTE — Progress Notes (Signed)
SUBJECTIVE:  Patient ID: Jonathan Shelton, male    DOB: 03/05/1951, 69 y.o.   MRN: [id number] Chief Complaint  Patient presents with  . Follow-up    ultrasound    HPI  Jonathan Shelton is a 69 y.o. male that presents today as referral from Dr. Parks Ranger.  The patient has complaints of aches and pains in his lower extremity that are worse when he stands.  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  The patient initially had issues during the summer with what sounds to be stasis dermatitis which was resolved once the patient received what is described to be a steroid cream.  The patient initially saw vascular surgery through [facility] on 08/07/2018 and the patient has been utilizing compression socks since that time.  The patient continues to have pain in the lower extremities with dependency. The pain is lessened with elevation. Graduated compression stockings, Class I (20-30 mmHg), have been worn but the stockings do not eliminate the leg pain. Over-the-counter analgesics do not improve the symptoms. The degree of discomfort continues to interfere with daily activities. The patient notes the pain in the legs is causing problems with daily exercise, at the workplace and even with household activities and maintenance such as standing in the kitchen preparing meals and doing dishes.   Venous ultrasound shows normal deep venous system, no evidence of acute or chronic DVT.  Superficial reflux is present in the bilateral great saphenous veins.  The right lower extremity has reflux beginning  in the distal thigh, which measures 0.51 cm.  The left lower extremity has reflux from the distal thigh to the proximal calf with veins measuring 0.36 to 0.44 cm.  Past Medical History:  Diagnosis Date  . Allergy   . Renal disorder     Past Surgical History:  Procedure Laterality Date  . CARPAL TUNNEL RELEASE    . CHOLECYSTECTOMY    . HERNIA REPAIR      Social History    Socioeconomic History  . Marital status: Married    Spouse name: Not on file  . Number of children: Not on file  . Years of education: Graduate Degree  . Highest education level: Master's degree (e.g., MA, MS, MEng, MEd, MSW, MBA)  Occupational History  . Not on file  Tobacco Use  . Smoking status: Former Smoker    Types: Cigarettes    Quit date: 1970    Years since quitting: 51.0  . Smokeless tobacco: Former Network engineer and Sexual Activity  . Alcohol use: Not Currently  . Drug use: Yes    Comment: past  . Sexual activity: Not on file  Other Topics Concern  . Not on file  Social History Narrative  . Not on file   Social Determinants of Health   Financial Resource Strain:   . Difficulty of Paying Living Expenses: Not on file  Food Insecurity:   . Worried About Charity fundraiser in the Last Year: Not on file  . Ran Out of Food in the Last Year: Not on file  Transportation Needs:   . Lack of Transportation (Medical): Not on file  . Lack of Transportation (Non-Medical): Not on file  Physical Activity:   . Days of Exercise per Week: Not on file  . Minutes of Exercise per Session: Not on file  Stress:   . Feeling of Stress : Not on file  Social Connections:   . Frequency of Communication with Friends  and Family: Not on file  . Frequency of Social Gatherings with Friends and Family: Not on file  . Attends Religious Services: Not on file  . Active Member of Clubs or Organizations: Not on file  . Attends Archivist Meetings: Not on file  . Marital Status: Not on file  Intimate Partner Violence:   . Fear of Current or Ex-Partner: Not on file  . Emotionally Abused: Not on file  . Physically Abused: Not on file  . Sexually Abused: Not on file    Family History  Problem Relation Age of Onset  . Heart disease Mother 33  . Heart disease Father   . Stroke Father 35  . Diabetes Father   . Heart attack Father     Allergies  Allergen Reactions  .  Testosterone Rash    patch     Review of Systems   Review of Systems: Negative Unless Checked Constitutional: [] Weight loss  [] Fever  [] Chills Cardiac: [] Chest pain   []  Atrial Fibrillation  [] Palpitations   [] Shortness of breath when laying flat   [] Shortness of breath with exertion. [] Shortness of breath at rest Vascular:  [] Pain in legs with walking   [x] Pain in legs with standing [] Pain in legs when laying flat   [] Claudication    [] Pain in feet when laying flat    [] History of DVT   [] Phlebitis   [x] Swelling in legs   [x] Varicose veins   [] Non-healing ulcers Pulmonary:   [] Uses home oxygen   [] Productive cough   [] Hemoptysis   [] Wheeze  [] COPD   [] Asthma Neurologic:  [] Dizziness   [] Seizures  [] Blackouts [] History of stroke   [] History of TIA  [] Aphasia   [] Temporary Blindness   [] Weakness or numbness in arm   [] Weakness or numbness in leg Musculoskeletal:   [] Joint swelling   [] Joint pain   [] Low back pain  []  History of Knee Replacement [] Arthritis [] back Surgeries  []  Spinal Stenosis    Hematologic:  [] Easy bruising  [] Easy bleeding   [] Hypercoagulable state   [] Anemic Gastrointestinal:  [] Diarrhea   [] Vomiting  [] Gastroesophageal reflux/heartburn   [] Difficulty swallowing. [] Abdominal pain Genitourinary:  [] Chronic kidney disease   [] Difficult urination  [] Anuric   [] Blood in urine [] Frequent urination  [] Burning with urination   [] Hematuria Skin:  [x] Rashes   [] Ulcers [] Wounds Psychological:  [] History of anxiety   []  History of major depression  []  Memory Difficulties      OBJECTIVE:   Physical Exam  BP (!) 145/68 (BP Location: Right Arm)   Pulse 88   Resp 16   Ht 5\' 10"  (1.778 m)   Wt 242 lb (109.8 kg)   BMI 34.72 kg/m   Gen: WD/WN, NAD Head: [place]/AT, No temporalis wasting.  Ear/Nose/Throat: Hearing grossly intact, nares w/o erythema or drainage Eyes: PER, EOMI, sclera nonicteric.  Neck: Supple, no masses.  No JVD.  Pulmonary:  Good air movement, no use of accessory  muscles.  Cardiac: RRR Vascular:  Varicosities present bilaterally with the largest varicosities on the right lower extremity which is very torturous.  Small scattered varicosities bilaterally.  Mild stasis dermatitis bilaterally Vessel Right Left  Dorsalis Pedis Palpable Palpable  Posterior Tibial Palpable Palpable   Gastrointestinal: soft, non-distended. No guarding/no peritoneal signs.  Musculoskeletal: M/S 5/5 throughout.  No deformity or atrophy.  Neurologic: Pain and light touch intact in extremities.  Symmetrical.  Speech is fluent. Motor exam as listed above. Psychiatric: Judgment intact, Mood & affect appropriate for pt's clinical situation. Dermatologic:  Stasis dermatitis bilaterally. No Ulcers Noted.  No changes consistent with cellulitis.        ASSESSMENT AND PLAN:  1. Varicose veins of bilateral lower extremities with pain Recommend  I have reviewed my previous  discussion with the patient regarding  varicose veins and why they cause symptoms. Patient will continue  wearing graduated compression stockings class 1 on a daily basis, beginning first thing in the morning and removing them in the evening.    In addition, behavioral modification including elevation during the day was again discussed and this will continue.  The patient has utilized over the counter pain medications and has been exercising.  However, at this time conservative therapy has not alleviated the patient's symptoms of leg pain and swelling  Recommend: laser ablation of the right and  left great saphenous veins to eliminate the symptoms of pain and swelling of the lower extremities caused by the severe superficial venous reflux disease.   2. Type 2 diabetes mellitus with other specified complication, with long-term current use of insulin (HCC) Continue hypoglycemic medications as already ordered, these medications have been reviewed and there are no changes at this time.  Hgb A1C to be monitored as  already arranged by primary service    Current Outpatient Medications on File Prior to Visit  Medication Sig Dispense Refill  . aspirin 81 MG chewable tablet Chew by mouth.    Marland Kitchen atorvastatin (LIPITOR) 10 MG tablet Take 1 tablet (10 mg total) by mouth daily at 6 PM. 90 tablet 1  . CRANBERRY FRUIT PO Take by mouth.    . empagliflozin (JARDIANCE) 25 MG TABS tablet Take 25 mg by mouth daily. 90 tablet 1  . finasteride (PROSCAR) 5 MG tablet Take 1 tablet (5 mg total) by mouth daily. 90 tablet 1  . insulin NPH Human (NOVOLIN N) 100 UNIT/ML injection Inject 0.45-0.6 mLs (45-60 Units total) into the skin at bedtime. 10 mL 0  . metFORMIN (GLUCOPHAGE) 1000 MG tablet Take 1 tablet (1,000 mg total) by mouth 2 (two) times daily with a meal. 180 tablet 1  . OZEMPIC, 1 MG/DOSE, 2 MG/1.5ML SOPN Inject 1 mg into the skin once a week. 2 pen 5  . tamsulosin (FLOMAX) 0.4 MG CAPS capsule Take 1 capsule (0.4 mg total) by mouth daily after supper. 90 capsule 1  . triamcinolone cream (KENALOG) 0.1 % APPLY TO AFFECTED AREA TWICE A DAY AS NEEDED    . valsartan (DIOVAN) 80 MG tablet Take 1 tablet (80 mg total) by mouth daily. 90 tablet 1  . vitamin E 400 UNIT capsule Take 400 Units by mouth daily.    Marland Kitchen albuterol (VENTOLIN HFA) 108 (90 Base) MCG/ACT inhaler SMARTSIG:1 Puff(s) By Mouth Every 4 Hours PRN     No current facility-administered medications on file prior to visit.    There are no Patient Instructions on file for this visit. No follow-ups on file.   Kris Hartmann, NP  This note was completed with Sales executive.  Any errors are purely unintentional.

## 2019-12-03 ENCOUNTER — Other Ambulatory Visit: Payer: Self-pay | Admitting: Family Medicine

## 2019-12-03 DIAGNOSIS — E1169 Type 2 diabetes mellitus with other specified complication: Secondary | ICD-10-CM

## 2019-12-31 ENCOUNTER — Encounter: Payer: Self-pay | Admitting: Family Medicine

## 2019-12-31 ENCOUNTER — Other Ambulatory Visit: Payer: Self-pay

## 2019-12-31 ENCOUNTER — Ambulatory Visit (INDEPENDENT_AMBULATORY_CARE_PROVIDER_SITE_OTHER): Payer: Medicare HMO | Admitting: Family Medicine

## 2019-12-31 DIAGNOSIS — K921 Melena: Secondary | ICD-10-CM | POA: Diagnosis not present

## 2019-12-31 DIAGNOSIS — K625 Hemorrhage of anus and rectum: Secondary | ICD-10-CM

## 2019-12-31 DIAGNOSIS — K5903 Drug induced constipation: Secondary | ICD-10-CM

## 2019-12-31 DIAGNOSIS — Z8719 Personal history of other diseases of the digestive system: Secondary | ICD-10-CM

## 2019-12-31 MED ORDER — POLYETHYLENE GLYCOL 3350 17 GM/SCOOP PO POWD: 17.0000 g | Freq: Every day | ORAL | 1 refills | Status: DC | PRN

## 2019-12-31 NOTE — Patient Instructions (Addendum)
Thank you for coming to the office today.  If needed we can refer to GI for colonoscopy and this problem sooner, or wait til next apt.   May have internal hemorrhoid causing bleeding from straining.  For Constipation (less frequent bowel movement that can be hard dry or involve straining).  Recommend trying OTC Miralax 17g = 1 capful in large glass water once daily for now, try several days to see if working, goal is soft stool or BM 1-2 times daily, if too loose then reduce dose or try every other day. If not effective may need to increase it to 2 doses at once in AM or may do 1 in morning and 1 in afternoon/evening  - This medicine is very safe and can be used often without any problem and will not make you dehydrated. It is good for use on AS NEEDED BASIS or even MAINTENANCE therapy for longer term for several days to weeks at a time to help regulate bowel movements  Other more natural remedies or preventative treatment: - Increase hydration with water - Increase fiber in diet (high fiber foods = vegetables, leafy greens, oats/grains) - May take OTC Fiber supplement (metamucil powder or pill/gummy) - May try OTC Probiotic   Please schedule a Follow-up Appointment to: Return in about 4 weeks (around 01/28/2020), or if symptoms worsen or fail to improve.  If you have any other questions or concerns, please feel free to call the office or send a message through [facility]. You may also schedule an earlier appointment if necessary.  Additionally, you may be receiving a survey about your experience at our office within a few days to 1 week by e-mail or mail. We value your feedback.  Nobie Putnam, DO [facility]

## 2019-12-31 NOTE — Progress Notes (Signed)
Virtual Visit via Telephone The purpose of this virtual visit is to provide medical care while limiting exposure to the novel coronavirus (COVID19) for both patient and office staff.  Consent was obtained for phone visit:  Yes.   Answered questions that patient had about telehealth interaction:  Yes.   I discussed the limitations, risks, security and privacy concerns of performing an evaluation and management service by telephone. I also discussed with the patient that there may be a patient responsible charge related to this service. The patient expressed understanding and agreed to proceed.  Patient Location: Home Provider Location: [facility])  ---------------------------------------------------------------------- Chief Complaint  Patient presents with  . Rectal Bleeding    as per patient he notice lots of blood going on from past 2 bowel movement onset 4 days    S: Reviewed CMA documentation. I have called patient and gathered additional HPI as follows:  Rectal Bleeding / Constipation Reports that symptoms started about 4 days ago with rectal bleeding. Normally he has a bowel movement now about every 3rd day. He says first episode was onset 4 days ago with bowel movement he identified moderate to larger amount of red blood mixed in with stool and in toilet bowel. - he is taking Vitamin D as well for COVID19 prevention and Vitamin C in winter. - he says only new med is the [place], and asking if this can cause constipation. - He says he was doing well on Ozempic, weight down to 134 lbs and he thinks his A1c is down, last result was >7 - Last colonoscopy >3-4 years ago from prior PCP, requested prior record. Had polyps, pre cancerous but benign removed. He thinks he is due to return within 1 year. He had cologuard negative in 06/2019  Denies dark stool or melena, abdominal pain, nausea vomiting early satiety  Denies any high risk travel to areas of current  concern for COVID19. Denies any known or suspected exposure to person with or possibly with COVID19.  Denies any fevers, chills, sweats, body ache, cough, shortness of breath, sinus pain or pressure, headache, abdominal pain  Past Medical History:  Diagnosis Date  . Allergy   . Renal disorder    Social History   Tobacco Use  . Smoking status: Former Smoker    Types: Cigarettes    Quit date: 1970    Years since quitting: 51.1  . Smokeless tobacco: Former Network engineer Use Topics  . Alcohol use: Not Currently  . Drug use: Yes    Comment: past    Current Outpatient Medications:  .  albuterol (VENTOLIN HFA) 108 (90 Base) MCG/ACT inhaler, SMARTSIG:1 Puff(s) By Mouth Every 4 Hours PRN, Disp: , Rfl:  .  aspirin 81 MG chewable tablet, Chew by mouth., Disp: , Rfl:  .  atorvastatin (LIPITOR) 10 MG tablet, Take 1 tablet (10 mg total) by mouth daily at 6 PM., Disp: 90 tablet, Rfl: 1 .  CRANBERRY FRUIT PO, Take by mouth., Disp: , Rfl:  .  finasteride (PROSCAR) 5 MG tablet, Take 1 tablet (5 mg total) by mouth daily., Disp: 90 tablet, Rfl: 1 .  insulin NPH Human (NOVOLIN N) 100 UNIT/ML injection, Inject 0.45-0.6 mLs (45-60 Units total) into the skin at bedtime., Disp: 10 mL, Rfl: 0 .  JARDIANCE 25 MG TABS tablet, TAKE 1 TABLET BY MOUTH EVERY DAY, Disp: 90 tablet, Rfl: 1 .  metFORMIN (GLUCOPHAGE) 1000 MG tablet, Take 1 tablet (1,000 mg total) by mouth 2 (two) times  daily with a meal., Disp: 180 tablet, Rfl: 1 .  OZEMPIC, 1 MG/DOSE, 2 MG/1.5ML SOPN, Inject 1 mg into the skin once a week., Disp: 2 pen, Rfl: 5 .  tamsulosin (FLOMAX) 0.4 MG CAPS capsule, Take 1 capsule (0.4 mg total) by mouth daily after supper., Disp: 90 capsule, Rfl: 1 .  triamcinolone cream (KENALOG) 0.1 %, APPLY TO AFFECTED AREA TWICE A DAY AS NEEDED, Disp: , Rfl:  .  valsartan (DIOVAN) 80 MG tablet, Take 1 tablet (80 mg total) by mouth daily., Disp: 90 tablet, Rfl: 1 .  vitamin E 400 UNIT capsule, Take 400 Units by mouth  daily., Disp: , Rfl:  .  polyethylene glycol powder (GLYCOLAX/MIRALAX) 17 GM/SCOOP powder, Take 17 g by mouth daily as needed for mild constipation or moderate constipation. Can increase to 2 doses a day or more if needed., Disp: 255 g, Rfl: 1  Depression screen [facility] 2/9 10/08/2019 07/04/2019 07/04/2019  Decreased Interest 0 2 2  Down, Depressed, Hopeless 0 0 0  PHQ - 2 Score 0 2 2  Altered sleeping - 2 2  Tired, decreased energy - 2 2  Change in appetite - 0 0  Feeling bad or failure about yourself  - 0 0  Trouble concentrating - 0 0  Moving slowly or fidgety/restless - 0 0  Suicidal thoughts - 0 0  PHQ-9 Score - 6 6  Difficult doing work/chores - Not difficult at all -    No flowsheet data found.  -------------------------------------------------------------------------- O: No physical exam performed due to remote telephone encounter.  Lab results reviewed.  Recent Results (from the past 2160 hour(s))  POCT HgB A1C     Status: Abnormal   Collection Time: 10/08/19 11:28 AM  Result Value Ref Range   Hemoglobin A1C 7.3 (A) 4.0 - 5.6 %  Novel Coronavirus, NAA ([facility])     Status: None   Collection Time: 10/25/19  1:18 PM   Specimen: Nasopharyngeal(NP) swabs in vial transport medium   NASOPHARYNGE  TESTING  Result Value Ref Range   SARS-CoV-2, NAA Not Detected Not Detected    Comment: This nucleic acid amplification test was developed and its performance characteristics determined by [employer]. Nucleic acid amplification tests include PCR and TMA. This test has not been FDA cleared or approved. This test has been authorized by FDA under an Emergency Use Authorization (EUA). This test is only authorized for the duration of time the declaration that circumstances exist justifying the authorization of the emergency use of in vitro diagnostic tests for detection of SARS-CoV-2 virus and/or diagnosis of COVID-19 infection under section 564(b)(1) of the Act, 21  U.S.C. [id number]) (1), unless the authorization is terminated or revoked sooner. When diagnostic testing is negative, the possibility of a false negative result should be considered in the context of a patient's recent exposures and the presence of clinical signs and symptoms consistent with COVID-19. An individual without symptoms of COVID-19 and who is not shedding SARS-CoV-2 virus would  expect to have a negative (not detected) result in this assay.     -------------------------------------------------------------------------- A&P:  Problem List Items Addressed This Visit    None    Visit Diagnoses    Rectal bleeding    -  Primary   Blood in stool       History of hemorrhoids       Drug-induced constipation       Relevant Medications   polyethylene glycol powder (GLYCOLAX/MIRALAX) 17 GM/SCOOP powder     Clinically  most likely cause of his BRBPR as described 1-2 episodes would be constipation and some bleeding from internal hemorrhoids as known issues. - Reassurance with only pre-cancer polyps removed 3-4 years ago colonoscopy and negative cologuard 06/2019. Less likely to be GI malignancy as cause of bleeding - Also the history of his bleeding episode is most suggestive by internal hemorrhoid or constipation related bleeding  Plan - Treat constipation currently. Seems side effect of Ozempic, however effective for him for DM control and wt loss will continue med. - Add rx generic Miralax 17-34g daily for now and titrate as advised to treat acute constipation improve regularity of BMs - Add OTC fiber supplement daily for future prevention, continue improve hydration - Info per AVS  Follow-up sooner if symptoms do not resolve, or new concerns as discussed. Keep upcoming apt with me in March about 6 weeks from now, we can re-discuss and possible refer to GI at that time if need or ready to pursue Colonoscopy.  Meds ordered this encounter  Medications  . polyethylene glycol  powder (GLYCOLAX/MIRALAX) 17 GM/SCOOP powder    Sig: Take 17 g by mouth daily as needed for mild constipation or moderate constipation. Can increase to 2 doses a day or more if needed.    Dispense:  255 g    Refill:  1    Follow-up: - Return in 6 weeks as scheduled already 01/2020  Patient verbalizes understanding with the above medical recommendations including the limitation of remote medical advice.  Specific follow-up and call-back criteria were given for patient to follow-up or seek medical care more urgently if needed.   - Time spent in direct consultation with patient on phone: 12 minutes   Nobie Putnam, [facility] 12/31/2019, 4:09 PM

## 2020-01-03 ENCOUNTER — Telehealth: Payer: Self-pay

## 2020-01-03 DIAGNOSIS — K921 Melena: Secondary | ICD-10-CM

## 2020-01-03 DIAGNOSIS — Z8719 Personal history of other diseases of the digestive system: Secondary | ICD-10-CM

## 2020-01-03 DIAGNOSIS — K625 Hemorrhage of anus and rectum: Secondary | ICD-10-CM

## 2020-01-03 NOTE — Telephone Encounter (Signed)
Patient called stating that he is still having problems with rectal bleeding.  He is requesting a referral for gastro.  Patient is hoping to see gastro before march 3rd since he is going out of town.

## 2020-01-03 NOTE — Telephone Encounter (Signed)
Referral placed.  *requested apt prior to March 3rd, but cannot guarantee that*  Reason for Referral: episodic rectal bleeding recently, question if internal hemorrhoids, he has history of colon polyps, last colonoscopy about 4 years ago, due within 1 year for repeat. Requesting GI consultation and discuss about repeat colonoscopy or treatment of possible internal hemorrhoids if identified.  Referral discussed with patient: yes  Best contact number of patient for referral team: [phone] (M)  Has patient been seen by a specialist for this issue before: no  If so, who (practice/provider):  Does the patient wish to return:  Patient provider preference for referral: First available  Patient location preference for referral: [place], [facility] 01/03/2020, 12:37 PM

## 2020-01-14 ENCOUNTER — Ambulatory Visit: Payer: Medicare HMO | Admitting: Gastroenterology

## 2020-01-14 ENCOUNTER — Other Ambulatory Visit: Payer: Self-pay

## 2020-01-14 ENCOUNTER — Encounter: Payer: Self-pay | Admitting: Gastroenterology

## 2020-01-14 VITALS — BP 105/71 | HR 88 | Temp 97.7°F | Ht 70.0 in | Wt 243.0 lb

## 2020-01-14 DIAGNOSIS — K625 Hemorrhage of anus and rectum: Secondary | ICD-10-CM

## 2020-01-14 DIAGNOSIS — K5909 Other constipation: Secondary | ICD-10-CM

## 2020-01-14 DIAGNOSIS — Z86010 Personal history of colonic polyps: Secondary | ICD-10-CM

## 2020-01-14 NOTE — Patient Instructions (Signed)

## 2020-01-14 NOTE — Progress Notes (Signed)
Cephas Darby, MD [address]  [place]  [place], [place] [address]  Main: [phone]  Fax: [phone]    Gastroenterology Consultation  Referring Provider:     Nobie Putnam * Primary Care Physician:  [name], DO Primary Gastroenterologist:  Dr. Cephas Darby Reason for Consultation:    Rectal bleeding, chronic constipation        HPI:   Jonathan Shelton is a 69 y.o. male referred by Dr. Parks Ranger, Devonne Doughty, DO  for consultation & management of rectal bleeding, chronic constipation.  He has been experiencing constipation for several years. He reports that he has been undergoing surveillance colonoscopies for his personal history of polyps every 2 years.  His last colonoscopy was 4 years ago before he moved to [place].  Reports that he has been noticing blood on wiping in the last 1 month.  He was also severely constipated.  He started align probiotic which has resulted in improvement of his bowel movement.  He denies pushing or straining, he reports having bowel movement every other day bowel movement yesterday with no evidence of rectal bleeding.  He does not have any other GI concerns  NSAIDs: None  Antiplts/Anticoagulants/Anti thrombotics: None  GI Procedures: Colonoscopy 4 years ago, personal history of colon polyps  Past Medical History:  Diagnosis Date  . Allergy   . Renal disorder     Past Surgical History:  Procedure Laterality Date  . CARPAL TUNNEL RELEASE    . CHOLECYSTECTOMY    . HERNIA REPAIR      Current Outpatient Medications:  .  albuterol (VENTOLIN HFA) 108 (90 Base) MCG/ACT inhaler, SMARTSIG:1 Puff(s) By Mouth Every 4 Hours PRN, Disp: , Rfl:  .  aspirin 81 MG chewable tablet, Chew by mouth., Disp: , Rfl:  .  atorvastatin (LIPITOR) 10 MG tablet, Take 1 tablet (10 mg total) by mouth daily at 6 PM., Disp: 90 tablet, Rfl: 1 .  bifidobacterium infantis (ALIGN) capsule, Take 1 capsule by mouth daily., Disp: ,  Rfl:  .  CRANBERRY FRUIT PO, Take by mouth., Disp: , Rfl:  .  finasteride (PROSCAR) 5 MG tablet, Take 1 tablet (5 mg total) by mouth daily., Disp: 90 tablet, Rfl: 1 .  insulin NPH Human (NOVOLIN N) 100 UNIT/ML injection, Inject 0.45-0.6 mLs (45-60 Units total) into the skin at bedtime., Disp: 10 mL, Rfl: 0 .  JARDIANCE 25 MG TABS tablet, TAKE 1 TABLET BY MOUTH EVERY DAY, Disp: 90 tablet, Rfl: 1 .  metFORMIN (GLUCOPHAGE) 1000 MG tablet, Take 1 tablet (1,000 mg total) by mouth 2 (two) times daily with a meal., Disp: 180 tablet, Rfl: 1 .  OZEMPIC, 1 MG/DOSE, 2 MG/1.5ML SOPN, Inject 1 mg into the skin once a week., Disp: 2 pen, Rfl: 5 .  polyethylene glycol powder (GLYCOLAX/MIRALAX) 17 GM/SCOOP powder, Take 17 g by mouth daily as needed for mild constipation or moderate constipation. Can increase to 2 doses a day or more if needed., Disp: 255 g, Rfl: 1 .  psyllium (METAMUCIL) 58.6 % packet, Take 1 packet by mouth daily., Disp: , Rfl:  .  tamsulosin (FLOMAX) 0.4 MG CAPS capsule, Take 1 capsule (0.4 mg total) by mouth daily after supper., Disp: 90 capsule, Rfl: 1 .  triamcinolone cream (KENALOG) 0.1 %, APPLY TO AFFECTED AREA TWICE A DAY AS NEEDED, Disp: , Rfl:  .  valsartan (DIOVAN) 80 MG tablet, Take 1 tablet (80 mg total) by mouth daily., Disp: 90 tablet, Rfl: 1 .  vitamin E 400 UNIT capsule, Take 400 Units by mouth daily., Disp: , Rfl:    Family History  Problem Relation Age of Onset  . Heart disease Mother 21  . Heart disease Father   . Stroke Father 74  . Diabetes Father   . Heart attack Father      Social History   Tobacco Use  . Smoking status: Former Smoker    Types: Cigarettes    Quit date: 1970    Years since quitting: 51.1  . Smokeless tobacco: Former Network engineer Use Topics  . Alcohol use: Not Currently  . Drug use: Yes    Comment: past    Allergies as of 01/14/2020 - Review Complete 01/14/2020  Allergen Reaction Noted  . Testosterone Rash 05/10/2019    Review of  Systems:    All systems reviewed and negative except where noted in HPI.   Physical Exam:  BP 105/71 (BP Location: Left Arm, Patient Position: Sitting, Cuff Size: Normal)   Pulse 88   Temp 97.7 F (36.5 C) (Oral)   Ht 5\' 10"  (1.778 m)   Wt 243 lb (110.2 kg)   BMI 34.87 kg/m  No LMP for male patient.  General:   Alert,  Well-developed, well-nourished, pleasant and cooperative in NAD Head:  Normocephalic and atraumatic. Eyes:  Sclera clear, no icterus.   Conjunctiva pink. Ears:  Normal auditory acuity. Nose:  No deformity, discharge, or lesions. Mouth:  No deformity or lesions,oropharynx pink & moist. Neck:  Supple; no masses or thyromegaly. Lungs:  Respirations even and unlabored.  Clear throughout to auscultation.   No wheezes, crackles, or rhonchi. No acute distress. Heart:  Regular rate and rhythm; no murmurs, clicks, rubs, or gallops. Abdomen:  Normal bowel sounds. Soft, obese, non-tender and non-distended without masses, hepatosplenomegaly, moderate-sized ventral hernia, midline vertical scar below the umbilicus.  No guarding or rebound tenderness.   Rectal: Not performed Msk:  Symmetrical without gross deformities. Good, equal movement & strength bilaterally. Pulses:  Normal pulses noted. Extremities:  No clubbing or edema.  No cyanosis. Neurologic:  Alert and oriented x3;  grossly normal neurologically. Skin:  Intact without significant lesions or rashes. No jaundice. Psych:  Alert and cooperative. Normal mood and affect.  Imaging Studies: Reviewed  Assessment and Plan:   Jonathan Shelton is a 69 y.o. male with metabolic syndrome, personal history of colon polyps is seen in consultation for rectal bleeding and chronic constipation  Rectal bleeding Recommend colonoscopy for further evaluation Avoid constipation, recommend high-fiber diet and fiber supplements Okay to continue align Recommended to take MiraLAX as needed  Discussed with patient about hemorrhoid ligation  after colonoscopy if the bleeding is persistent  Personal history of colon polyps Recommend colonoscopy as above  I have discussed alternative options, risks & benefits,  which include, but are not limited to, bleeding, infection, perforation,respiratory complication & drug reaction.  The patient agrees with this plan & written consent will be obtained.      Follow up in 2 months   Cephas Darby, MD

## 2020-01-15 ENCOUNTER — Ambulatory Visit: Payer: Medicare HMO | Attending: Internal Medicine

## 2020-01-15 DIAGNOSIS — Z20822 Contact with and (suspected) exposure to covid-19: Secondary | ICD-10-CM

## 2020-01-16 LAB — NOVEL CORONAVIRUS, NAA: SARS-CoV-2, NAA: NOT DETECTED

## 2020-02-04 ENCOUNTER — Other Ambulatory Visit: Payer: Self-pay

## 2020-02-04 ENCOUNTER — Other Ambulatory Visit
Admission: RE | Admit: 2020-02-04 | Discharge: 2020-02-04 | Disposition: A | Discharge status: Home / Self Care | Payer: Medicare HMO | Source: Ambulatory Visit | Attending: Gastroenterology | Admitting: Gastroenterology

## 2020-02-04 DIAGNOSIS — Z01812 Encounter for preprocedural laboratory examination: Secondary | ICD-10-CM | POA: Diagnosis present

## 2020-02-04 DIAGNOSIS — Z20822 Contact with and (suspected) exposure to covid-19: Secondary | ICD-10-CM | POA: Insufficient documentation

## 2020-02-04 LAB — SARS CORONAVIRUS 2 (TAT 6-24 HRS): SARS Coronavirus 2: NEGATIVE

## 2020-02-06 ENCOUNTER — Encounter: Payer: Self-pay | Admitting: Gastroenterology

## 2020-02-06 ENCOUNTER — Telehealth: Payer: Self-pay

## 2020-02-06 ENCOUNTER — Encounter
Admission: RE | Disposition: A | Discharge status: Home / Self Care | Payer: Self-pay | Source: Home / Self Care | Attending: Gastroenterology

## 2020-02-06 ENCOUNTER — Ambulatory Visit: Payer: Medicare HMO | Admitting: Anesthesiology

## 2020-02-06 ENCOUNTER — Ambulatory Visit
Admission: RE | Admit: 2020-02-06 | Discharge: 2020-02-06 | Disposition: A | Discharge status: Home / Self Care | Payer: Medicare HMO | Attending: Gastroenterology | Admitting: Gastroenterology

## 2020-02-06 ENCOUNTER — Other Ambulatory Visit: Payer: Self-pay

## 2020-02-06 DIAGNOSIS — D120 Benign neoplasm of cecum: Secondary | ICD-10-CM | POA: Diagnosis not present

## 2020-02-06 DIAGNOSIS — K648 Other hemorrhoids: Secondary | ICD-10-CM | POA: Insufficient documentation

## 2020-02-06 DIAGNOSIS — K635 Polyp of colon: Secondary | ICD-10-CM

## 2020-02-06 DIAGNOSIS — Z87891 Personal history of nicotine dependence: Secondary | ICD-10-CM | POA: Diagnosis not present

## 2020-02-06 DIAGNOSIS — Z794 Long term (current) use of insulin: Secondary | ICD-10-CM | POA: Diagnosis not present

## 2020-02-06 DIAGNOSIS — K5730 Diverticulosis of large intestine without perforation or abscess without bleeding: Secondary | ICD-10-CM | POA: Diagnosis not present

## 2020-02-06 DIAGNOSIS — K644 Residual hemorrhoidal skin tags: Secondary | ICD-10-CM | POA: Insufficient documentation

## 2020-02-06 DIAGNOSIS — E119 Type 2 diabetes mellitus without complications: Secondary | ICD-10-CM | POA: Insufficient documentation

## 2020-02-06 DIAGNOSIS — Z79899 Other long term (current) drug therapy: Secondary | ICD-10-CM | POA: Insufficient documentation

## 2020-02-06 DIAGNOSIS — Z7982 Long term (current) use of aspirin: Secondary | ICD-10-CM | POA: Insufficient documentation

## 2020-02-06 DIAGNOSIS — K625 Hemorrhage of anus and rectum: Secondary | ICD-10-CM | POA: Diagnosis present

## 2020-02-06 DIAGNOSIS — D123 Benign neoplasm of transverse colon: Secondary | ICD-10-CM | POA: Diagnosis not present

## 2020-02-06 HISTORY — PX: COLONOSCOPY WITH PROPOFOL: SHX5780

## 2020-02-06 LAB — GLUCOSE, CAPILLARY: Glucose-Capillary: 130 mg/dL — ABNORMAL HIGH (ref 70–99)

## 2020-02-06 SURGERY — COLONOSCOPY WITH PROPOFOL
Anesthesia: General

## 2020-02-06 MED ORDER — PROPOFOL 500 MG/50ML IV EMUL
INTRAVENOUS | Status: AC
  Filled 2020-02-06: qty 100

## 2020-02-06 MED ORDER — EPINEPHRINE 1 MG/10ML IJ SOSY
PREFILLED_SYRINGE | INTRAMUSCULAR | Status: AC
  Filled 2020-02-06: qty 10

## 2020-02-06 MED ORDER — EPINEPHRINE 1 MG/10ML IJ SOSY
PREFILLED_SYRINGE | INTRAMUSCULAR | Status: DC | PRN
  Administered 2020-02-06 (×2): 0.3 mg via SUBCUTANEOUS

## 2020-02-06 MED ORDER — MIDAZOLAM HCL 2 MG/2ML IJ SOLN
INTRAMUSCULAR | Status: AC
  Filled 2020-02-06: qty 2

## 2020-02-06 MED ORDER — GLUCAGON HCL RDNA (DIAGNOSTIC) 1 MG IJ SOLR
INTRAMUSCULAR | Status: DC | PRN
  Administered 2020-02-06: 1 mg via INTRAVENOUS

## 2020-02-06 MED ORDER — PROPOFOL 500 MG/50ML IV EMUL
INTRAVENOUS | Status: AC
  Filled 2020-02-06: qty 50

## 2020-02-06 MED ORDER — GLYCOPYRROLATE 0.2 MG/ML IJ SOLN
INTRAMUSCULAR | Status: AC
  Filled 2020-02-06: qty 1

## 2020-02-06 MED ORDER — GLUCAGON HCL RDNA (DIAGNOSTIC) 1 MG IJ SOLR
INTRAMUSCULAR | Status: AC
  Filled 2020-02-06: qty 1

## 2020-02-06 MED ORDER — SODIUM CHLORIDE 0.9 % IV SOLN: INTRAVENOUS | Status: DC

## 2020-02-06 MED ORDER — LIDOCAINE 2% (20 MG/ML) 5 ML SYRINGE
INTRAMUSCULAR | Status: DC | PRN
  Administered 2020-02-06: 25 mg via INTRAVENOUS

## 2020-02-06 MED ORDER — MIDAZOLAM HCL 5 MG/5ML IJ SOLN
INTRAMUSCULAR | Status: DC | PRN
  Administered 2020-02-06: 2 mg via INTRAVENOUS

## 2020-02-06 MED ORDER — LIDOCAINE HCL (PF) 2 % IJ SOLN
INTRAMUSCULAR | Status: AC
  Filled 2020-02-06: qty 10

## 2020-02-06 MED ORDER — PROPOFOL 10 MG/ML IV BOLUS
INTRAVENOUS | Status: DC | PRN
  Administered 2020-02-06: 70 mg via INTRAVENOUS

## 2020-02-06 MED ORDER — PROPOFOL 10 MG/ML IV BOLUS
INTRAVENOUS | Status: DC | PRN
  Administered 2020-02-06: 120 ug/kg/min via INTRAVENOUS

## 2020-02-06 NOTE — H&P (Signed)
Cephas Darby, MD [address]  [place]  [place], [place] [address]  Main: [phone]  Fax: [phone] Pager: [phone]  Primary Care Physician:  [name], DO Primary Gastroenterologist:  Dr. Cephas Darby  Pre-Procedure History & Physical: HPI:  Jonathan Shelton is a 69 y.o. male is here for an colonoscopy.   Past Medical History:  Diagnosis Date  . Allergy   . Diabetes mellitus without complication ([place])   . Renal disorder     Past Surgical History:  Procedure Laterality Date  . CARPAL TUNNEL RELEASE    . CHOLECYSTECTOMY    . HERNIA REPAIR      Prior to Admission medications   Medication Sig Start Date End Date Taking? Authorizing Provider  albuterol (VENTOLIN HFA) 108 (90 Base) MCG/ACT inhaler SMARTSIG:1 Puff(s) By Mouth Every 4 Hours PRN 10/25/19   [provider]  aspirin 81 MG chewable tablet Chew by mouth.    [provider]  atorvastatin (LIPITOR) 10 MG tablet Take 1 tablet (10 mg total) by mouth daily at 6 PM. 05/30/19   Karamalegos, Devonne Doughty, DO  bifidobacterium infantis (ALIGN) capsule Take 1 capsule by mouth daily.    [provider]  CRANBERRY FRUIT PO Take by mouth.    [provider]  finasteride (PROSCAR) 5 MG tablet Take 1 tablet (5 mg total) by mouth daily. 10/08/19   Karamalegos, Devonne Doughty, DO  insulin NPH Human (NOVOLIN N) 100 UNIT/ML injection Inject 0.45-0.6 mLs (45-60 Units total) into the skin at bedtime. 05/30/19   Karamalegos, Devonne Doughty, DO  JARDIANCE 25 MG TABS tablet TAKE 1 TABLET BY MOUTH EVERY DAY 12/04/19   Parks Ranger, Devonne Doughty, DO  metFORMIN (GLUCOPHAGE) 1000 MG tablet Take 1 tablet (1,000 mg total) by mouth 2 (two) times daily with a meal. 09/13/19   Karamalegos, Alexander J, DO  OZEMPIC, 1 MG/DOSE, 2 MG/1.5ML SOPN Inject 1 mg into the skin once a week. Patient not taking: Reported on 02/06/2020 10/08/19   [name], DO  polyethylene glycol powder  (GLYCOLAX/MIRALAX) 17 GM/SCOOP powder Take 17 g by mouth daily as needed for mild constipation or moderate constipation. Can increase to 2 doses a day or more if needed. Patient not taking: Reported on 02/06/2020 12/31/19   [name], DO  psyllium (METAMUCIL) 58.6 % packet Take 1 packet by mouth daily.    [provider]  tamsulosin (FLOMAX) 0.4 MG CAPS capsule Take 1 capsule (0.4 mg total) by mouth daily after supper. 10/08/19   Karamalegos, Devonne Doughty, DO  triamcinolone cream (KENALOG) 0.1 % APPLY TO AFFECTED AREA TWICE A DAY AS NEEDED 05/17/19   [provider]  valsartan (DIOVAN) 80 MG tablet Take 1 tablet (80 mg total) by mouth daily. 10/08/19   Karamalegos, Devonne Doughty, DO  vitamin E 400 UNIT capsule Take 400 Units by mouth daily.    [provider]    Allergies as of 01/15/2020 - Review Complete 01/14/2020  Allergen Reaction Noted  . Testosterone Rash 05/10/2019    Family History  Problem Relation Age of Onset  . Heart disease Mother 6  . Heart disease Father   . Stroke Father 18  . Diabetes Father   . Heart attack Father     Social History   Socioeconomic History  . Marital status: Married    Spouse name: Not on file  . Number of children: Not on file  . Years of education: Graduate Degree  . Highest education  level: Master's degree (e.g., MA, MS, MEng, MEd, MSW, MBA)  Occupational History  . Not on file  Tobacco Use  . Smoking status: Former Smoker    Types: Cigarettes    Quit date: 1970    Years since quitting: 51.2  . Smokeless tobacco: Former Network engineer and Sexual Activity  . Alcohol use: Not Currently  . Drug use: Yes    Comment: past  . Sexual activity: Not on file  Other Topics Concern  . Not on file  Social History Narrative  . Not on file   Social Determinants of Health   Financial Resource Strain:   . Difficulty of Paying Living Expenses:   Food Insecurity:   . Worried About Charity fundraiser in the  Last Year:   . Arboriculturist in the Last Year:   Transportation Needs:   . Film/video editor (Medical):   Marland Kitchen Lack of Transportation (Non-Medical):   Physical Activity:   . Days of Exercise per Week:   . Minutes of Exercise per Session:   Stress:   . Feeling of Stress :   Social Connections:   . Frequency of Communication with Friends and Family:   . Frequency of Social Gatherings with Friends and Family:   . Attends Religious Services:   . Active Member of Clubs or Organizations:   . Attends Archivist Meetings:   Marland Kitchen Marital Status:   Intimate Partner Violence:   . Fear of Current or Ex-Partner:   . Emotionally Abused:   Marland Kitchen Physically Abused:   . Sexually Abused:     Review of Systems: See HPI, otherwise negative ROS  Physical Exam: BP (!) 151/74   Pulse 83   Temp (!) 97.5 F (36.4 C) (Oral)   Resp 16   Ht 5\' 9"  (1.753 m)   Wt 105.7 kg   SpO2 100%   BMI 34.41 kg/m  General:   Alert,  pleasant and cooperative in NAD Head:  Normocephalic and atraumatic. Neck:  Supple; no masses or thyromegaly. Lungs:  Clear throughout to auscultation.    Heart:  Regular rate and rhythm. Abdomen:  Soft, nontender and nondistended. Normal bowel sounds, without guarding, and without rebound.   Neurologic:  Alert and  oriented x4;  grossly normal neurologically.  Impression/Plan: Jonathan Shelton is here for an colonoscopy to be performed for rectal bleeding  Risks, benefits, limitations, and alternatives regarding  colonoscopy have been reviewed with the patient.  Questions have been answered.  All parties agreeable.   Sherri Sear, MD  02/06/2020, 8:10 AM

## 2020-02-06 NOTE — Anesthesia Preprocedure Evaluation (Addendum)
Anesthesia Evaluation  Patient identified by MRN, date of birth, ID band Patient awake    Reviewed: Allergy & Precautions, H&P , NPO status , Patient's Chart, lab work & pertinent test results  Airway Mallampati: III  TM Distance: >3 FB Neck ROM: full    Dental  (+) Chipped   Pulmonary neg COPD, former smoker,           Cardiovascular (-) angina(-) Past MI negative cardio ROS  (-) dysrhythmias      Neuro/Psych negative neurological ROS  negative psych ROS   GI/Hepatic negative GI ROS, Neg liver ROS,   Endo/Other  diabetes, Type 2  Renal/GU negative Renal ROS  negative genitourinary   Musculoskeletal   Abdominal   Peds  Hematology negative hematology ROS (+)   Anesthesia Other Findings Past Medical History: No date: Allergy No date: Renal disorder  Past Surgical History: No date: CARPAL TUNNEL RELEASE No date: CHOLECYSTECTOMY No date: HERNIA REPAIR     Reproductive/Obstetrics negative OB ROS                            Anesthesia Physical Anesthesia Plan  ASA: II  Anesthesia Plan: General   Post-op Pain Management:    Induction:   PONV Risk Score and Plan: Propofol infusion and TIVA  Airway Management Planned: Natural Airway and Nasal Cannula  Additional Equipment:   Intra-op Plan:   Post-operative Plan:   Informed Consent: I have reviewed the patients History and Physical, chart, labs and discussed the procedure including the risks, benefits and alternatives for the proposed anesthesia with the patient or authorized representative who has indicated his/her understanding and acceptance.     Dental Advisory Given  Plan Discussed with: Anesthesiologist  Anesthesia Plan Comments:         Anesthesia Quick Evaluation

## 2020-02-06 NOTE — Op Note (Signed)
[facility] Gastroenterology Patient Name: Jonathan Shelton Procedure Date: 02/06/2020 8:10 AM MRN: [id number] Account #: [id number] Date of Birth: 06-26-1951 Admit Type: Outpatient Age: 69 Room: [facility] ENDO ROOM 4 Gender: Male Note Status: Finalized Procedure:             Colonoscopy Indications:           Rectal bleeding Providers:             Lin Landsman MD, MD Referring MD:          [name] (Referring MD) Medicines:             Monitored Anesthesia Care Complications:         No immediate complications. Estimated blood loss:                         Minimal. Procedure:             Pre-Anesthesia Assessment:                        - Prior to the procedure, a History and Physical was                         performed, and patient medications and allergies were                         reviewed. The patient is competent. The risks and                         benefits of the procedure and the sedation options and                         risks were discussed with the patient. All questions                         were answered and informed consent was obtained.                         Patient identification and proposed procedure were                         verified by the physician, the nurse, the                         anesthesiologist, the anesthetist and the technician                         in the pre-procedure area in the procedure room in the                         endoscopy suite. Mental Status Examination: alert and                         oriented. Airway Examination: normal oropharyngeal                         airway and neck mobility. Respiratory Examination:  clear to auscultation. CV Examination: normal.                         Prophylactic Antibiotics: The patient does not require                         prophylactic antibiotics. Prior Anticoagulants: The                         patient has taken no  previous anticoagulant or                         antiplatelet agents. ASA Grade Assessment: II - A                         patient with mild systemic disease. After reviewing                         the risks and benefits, the patient was deemed in                         satisfactory condition to undergo the procedure. The                         anesthesia plan was to use monitored anesthesia care                         (MAC). Immediately prior to administration of                         medications, the patient was re-assessed for adequacy                         to receive sedatives. The heart rate, respiratory                         rate, oxygen saturations, blood pressure, adequacy of                         pulmonary ventilation, and response to care were                         monitored throughout the procedure. The physical                         status of the patient was re-assessed after the                         procedure.                        After obtaining informed consent, the colonoscope was                         passed under direct vision. Throughout the procedure,                         the patient's blood pressure, pulse, and oxygen  saturations were monitored continuously. The                         Colonoscope was introduced through the anus and                         advanced to the the cecum, identified by appendiceal                         orifice and ileocecal valve. The colonoscopy was                         unusually difficult due to inadequate bowel prep,                         significant looping and the patient's body habitus.                         Successful completion of the procedure was aided by                         applying abdominal pressure. The patient tolerated the                         procedure well. The quality of the bowel preparation                         was evaluated using the BBPS [facility] Bowel  Preparation                         Scale) with scores of: Right Colon = 2 (minor amount                         of residual staining, small fragments of stool and/or                         opaque liquid, but mucosa seen well), Transverse Colon                         = 2 (minor amount of residual staining, small                         fragments of stool and/or opaque liquid, but mucosa                         seen well) and Left Colon = 2 (minor amount of                         residual staining, small fragments of stool and/or                         opaque liquid, but mucosa seen well). The total BBPS                         score equals 6. Findings:      The perianal and digital rectal examinations were normal. Pertinent       negatives include normal  sphincter tone and no palpable rectal lesions.      Three sessile polyps were found in the transverse colon. The polyps were       4 to 5 mm in size. These polyps were removed with a cold snare.       Resection and retrieval were complete.      A 40 mm polyp was found in the cecum. The polyp was carpet-like and       flat. Preparations were made for mucosal resection. Eleview was injected       to raise the lesion. Snare mucosal resection was performed. Resection       was incomplete. The resected tissue was retrieved. Area was successfully       injected with 6 mL of a 1:10,000 solution of epinephrine for hemostasis.       Estimated blood loss was minimal.      Multiple diverticula were found in the sigmoid colon.      Non-bleeding external and internal hemorrhoids were found during       retroflexion. The hemorrhoids were medium-sized. Impression:            - Three 4 to 5 mm polyps in the transverse colon,                         removed with a cold snare. Resected and retrieved.                        - One 40 mm polyp in the cecum, removed with mucosal                         resection. Incomplete resection. Resected tissue                          retrieved. Injected.                        - Diverticulosis in the sigmoid colon.                        - Non-bleeding external and internal hemorrhoids.                        - Mucosal resection was performed. Resection was                         incomplete. The resected tissue was retrieved. Recommendation:        - Discharge patient to home (with escort).                        - Cardiac diet today.                        - Continue present medications.                        - Await pathology results.                        - Repeat colonoscopy in 3 months with advanced  endoscopist because the polypectomy was incomplete,                         because the bowel preparation was poor. Procedure Code(s):     --- Professional ---                        [phone], Colonoscopy, flexible; with endoscopic mucosal                         resection                        45385, 25, Colonoscopy, flexible; with removal of                         tumor(s), polyp(s), or other lesion(s) by snare                         technique Diagnosis Code(s):     --- Professional ---                        K63.5, Polyp of colon                        K64.8, Other hemorrhoids                        K62.5, Hemorrhage of anus and rectum                        K57.30, Diverticulosis of large intestine without                         perforation or abscess without bleeding CPT copyright 2019 American Medical Association. All rights reserved. The codes documented in this report are preliminary and upon coder review may  be revised to meet current compliance requirements. Dr. Ulyess Mort Lin Landsman MD, MD 02/06/2020 9:11:21 AM This report has been signed electronically. Number of Addenda: 0 Note Initiated On: 02/06/2020 8:10 AM Scope Withdrawal Time: 0 hours 37 minutes 49 seconds  Total Procedure Duration: 0 hours 46 minutes 56 seconds  Estimated Blood Loss:   Estimated blood loss was minimal.      [facility]

## 2020-02-06 NOTE — Transfer of Care (Signed)
Immediate Anesthesia Transfer of Care Note  Patient: Jonathan Shelton  Procedure(s) Performed: COLONOSCOPY WITH PROPOFOL (N/A )  Patient Location: Endoscopy Unit  Anesthesia Type:General  Level of Consciousness: awake  Airway & Oxygen Therapy: Patient Spontanous Breathing and Patient connected to nasal cannula oxygen  Post-op Assessment: Report given to RN and Post -op Vital signs reviewed and stable  Post vital signs: Reviewed  Last Vitals:  Vitals Value Taken Time  BP 118/76 02/06/20 0910  Temp 36.1 C 02/06/20 0910  Pulse 89 02/06/20 0910  Resp 16 02/06/20 0910  SpO2 98 % 02/06/20 0910    Last Pain:  Vitals:   02/06/20 0729  TempSrc: Oral  PainSc: 0-No pain         Complications: No apparent anesthesia complications

## 2020-02-06 NOTE — Telephone Encounter (Signed)
Did referral to Dr. Rush Landmark

## 2020-02-06 NOTE — Anesthesia Postprocedure Evaluation (Signed)
Anesthesia Post Note  Patient: Jonathan Shelton  Procedure(s) Performed: COLONOSCOPY WITH PROPOFOL (N/A )  Patient location during evaluation: PACU Anesthesia Type: General Level of consciousness: awake and alert Pain management: pain level controlled Vital Signs Assessment: post-procedure vital signs reviewed and stable Respiratory status: spontaneous breathing, nonlabored ventilation and respiratory function stable Cardiovascular status: blood pressure returned to baseline and stable Postop Assessment: no apparent nausea or vomiting Anesthetic complications: no     Last Vitals:  Vitals:   02/06/20 0930 02/06/20 0940  BP: (!) 144/87 (!) 145/83  Pulse: 73 69  Resp: 16 17  Temp:    SpO2: 100% 97%    Last Pain:  Vitals:   02/06/20 0940  TempSrc:   PainSc: 0-No pain                 Tera Mater

## 2020-02-06 NOTE — Telephone Encounter (Signed)
-----   Message from Lin Landsman, MD sent at 02/06/2020 10:14 AM EDT ----- Regarding: Re: Referral, complex polypectomy Jonathan Shelton  Please refer pt to Dr Jerilynn Mages for complex polypectomy, cecum  Wynetta Fines  He will also need 2 day prep  Thanks Rohini

## 2020-02-07 ENCOUNTER — Encounter: Payer: Self-pay | Admitting: Gastroenterology

## 2020-02-07 ENCOUNTER — Encounter: Payer: Self-pay | Admitting: *Deleted

## 2020-02-07 LAB — SURGICAL PATHOLOGY

## 2020-02-11 ENCOUNTER — Ambulatory Visit (INDEPENDENT_AMBULATORY_CARE_PROVIDER_SITE_OTHER): Payer: Medicare HMO | Admitting: Family Medicine

## 2020-02-11 ENCOUNTER — Encounter: Payer: Self-pay | Admitting: Family Medicine

## 2020-02-11 ENCOUNTER — Other Ambulatory Visit: Payer: Self-pay | Admitting: Family Medicine

## 2020-02-11 ENCOUNTER — Other Ambulatory Visit: Payer: Self-pay

## 2020-02-11 ENCOUNTER — Telehealth: Payer: Self-pay

## 2020-02-11 VITALS — BP 119/60 | HR 72 | Temp 97.7°F | Resp 16 | Ht 70.0 in | Wt 239.0 lb

## 2020-02-11 DIAGNOSIS — I10 Essential (primary) hypertension: Secondary | ICD-10-CM

## 2020-02-11 DIAGNOSIS — E1169 Type 2 diabetes mellitus with other specified complication: Secondary | ICD-10-CM

## 2020-02-11 DIAGNOSIS — R3914 Feeling of incomplete bladder emptying: Secondary | ICD-10-CM

## 2020-02-11 DIAGNOSIS — L821 Other seborrheic keratosis: Secondary | ICD-10-CM | POA: Diagnosis not present

## 2020-02-11 DIAGNOSIS — E66811 Obesity, class 1: Secondary | ICD-10-CM

## 2020-02-11 DIAGNOSIS — E785 Hyperlipidemia, unspecified: Secondary | ICD-10-CM

## 2020-02-11 DIAGNOSIS — N401 Enlarged prostate with lower urinary tract symptoms: Secondary | ICD-10-CM | POA: Diagnosis not present

## 2020-02-11 DIAGNOSIS — Z1159 Encounter for screening for other viral diseases: Secondary | ICD-10-CM

## 2020-02-11 DIAGNOSIS — D126 Benign neoplasm of colon, unspecified: Secondary | ICD-10-CM

## 2020-02-11 DIAGNOSIS — R300 Dysuria: Secondary | ICD-10-CM

## 2020-02-11 DIAGNOSIS — Z85828 Personal history of other malignant neoplasm of skin: Secondary | ICD-10-CM | POA: Diagnosis not present

## 2020-02-11 DIAGNOSIS — E669 Obesity, unspecified: Secondary | ICD-10-CM

## 2020-02-11 DIAGNOSIS — Z794 Long term (current) use of insulin: Secondary | ICD-10-CM | POA: Diagnosis not present

## 2020-02-11 DIAGNOSIS — Z0000 Encounter for general adult medical examination without abnormal findings: Secondary | ICD-10-CM

## 2020-02-11 LAB — POCT URINALYSIS DIPSTICK
Bilirubin, UA: NEGATIVE
Blood, UA: NEGATIVE
Glucose, UA: POSITIVE — AB
Ketones, UA: NEGATIVE
Leukocytes, UA: NEGATIVE
Nitrite, UA: NEGATIVE
Protein, UA: POSITIVE — AB
Spec Grav, UA: 1.015 (ref 1.010–1.025)
Urobilinogen, UA: 0.2 E.U./dL
pH, UA: 5 (ref 5.0–8.0)

## 2020-02-11 LAB — POCT GLYCOSYLATED HEMOGLOBIN (HGB A1C): Hemoglobin A1C: 6.8 % — AB (ref 4.0–5.6)

## 2020-02-11 NOTE — Assessment & Plan Note (Signed)
Improved wt loss BMI down to 34 On GLP1 improving lifestyle

## 2020-02-11 NOTE — Progress Notes (Signed)
Subjective:    Patient ID: Jonathan Shelton, male    DOB: November 01, 1951, 69 y.o.   MRN: [id number]  Jonathan Shelton is a 69 y.o. male presenting on 02/11/2020 for Diabetes   HPI  CHRONIC DM, Type 2: Last result A1c 7.3 (down from 8.1) on ozempic Due for A1c today He has done well and has had weight loss on med. CBGs: [place] 90-120s, rare low sugar, high sugar approx 200 or less Meds:Novolin-N 14-20 units nightly, Ozempic 1mg  weekly, Jardiance 25mg  daily, Metformin 1000mg  BID Reports good compliance. Tolerating well w/o side-effects Currently on ARB DM Eye exam done by Dr Ellin Mayhew 07/10/19 Denies hypoglycemia, polyuria, visual changes, numbness or tingling.  CHRONIC HTN: Reports no new concerns. Current Meds - Valsartan 80mg  daily Reports good compliance, took meds today. Tolerating well, w/o complaints. Denies CP, dyspnea, HA, edema, dizziness / lightheadedness  Tubular Adenoma / Colon Polyps Recent course with known prior colon polyps back in approx 2016, as reported to me on 06/2019, we requested prior colonoscopy records, offered Cologuard as interval test. Results were negative for cologuard in 07/2019. Later he had rectal bleeding with hemorrhoids thought to be internal hemorrhoid, he was referred to GI, they performed colonoscopy 01/2020 and identified hemorrhoids and diverticulosis and based on colonoscopy report, identified 3 small polyps and 1 large polyp 52mm or 4cm, they did partial excision, result showed Tubular adenoma without high grade dysplasia on pathology, he was referred to [facility] [place] Dr Rush Landmark for complicated polyp excision. - Patient has questions on these results today to review them.   BPH LUTS / Incomplete bladder emptying dysuria Reports known chronic issue with BPH and incomplete bladder emptying. Previously discussed this issue in past, he had been doing fairly well on Tamsulosin 0.4mg  daily and Finasteride 5mg  daily, see prior reports,  previous PCP had him on 0.8 of flomax at one point but he does better on this regimen. - Today he asks about "burning" sensation he feels in bladder or in penis/urethra but often will wake him up at night but doesn't have dysuria usually with urinating - He still describes same BPH symptoms some reduced urinary stream, frequency, nocturia, incomplete emptying - He does take SGLT2 med, but has not endorsed UTI or Yeast infection - Not seen Urologist locally and not interested at this time  History of Skin Cancer / Seborrheic Keratosis Requests referral to see Dermatology. He had prior skin cancer removal - he does not know if it was non melanoma vs melanoma, it was removal with excision on face/nose/lip area and one on scalp. He needs to return to Dermatology - He has one abnormal growth on upper back    Depression screen [facility] 2/9 02/11/2020 10/08/2019 07/04/2019  Decreased Interest 0 0 2  Down, Depressed, Hopeless 0 0 0  PHQ - 2 Score 0 0 2  Altered sleeping - - 2  Tired, decreased energy - - 2  Change in appetite - - 0  Feeling bad or failure about yourself  - - 0  Trouble concentrating - - 0  Moving slowly or fidgety/restless - - 0  Suicidal thoughts - - 0  PHQ-9 Score - - 6  Difficult doing work/chores - - Not difficult at all    Social History   Tobacco Use  . Smoking status: Former Smoker    Types: Cigarettes    Quit date: 1970    Years since quitting: 51.2  . Smokeless tobacco: Former Network engineer Use Topics  .  Alcohol use: Not Currently  . Drug use: Yes    Comment: past    Review of Systems Per HPI unless specifically indicated above     Objective:    BP 119/60   Pulse 72   Temp 97.7 F (36.5 C) (Temporal)   Resp 16   Ht 5\' 10"  (1.778 m)   Wt 239 lb (108.4 kg)   SpO2 98%   BMI 34.29 kg/m   Wt Readings from Last 3 Encounters:  02/11/20 239 lb (108.4 kg)  02/06/20 233 lb (105.7 kg)  01/14/20 243 lb (110.2 kg)    Physical Exam Vitals and nursing  note reviewed.  Constitutional:      General: He is not in acute distress.    Appearance: He is well-developed. He is not diaphoretic.     Comments: Well-appearing, comfortable, cooperative  HENT:     Head: Normocephalic and atraumatic.  Eyes:     General:        Right eye: No discharge.        Left eye: No discharge.     Conjunctiva/sclera: Conjunctivae normal.  Neck:     Thyroid: No thyromegaly.  Cardiovascular:     Rate and Rhythm: Normal rate and regular rhythm.     Heart sounds: Normal heart sounds. No murmur.  Pulmonary:     Effort: Pulmonary effort is normal. No respiratory distress.     Breath sounds: Normal breath sounds. No wheezing or rales.  Musculoskeletal:        General: Normal range of motion.     Cervical back: Normal range of motion and neck supple.  Lymphadenopathy:     Cervical: No cervical adenopathy.  Skin:    General: Skin is warm and dry.     Findings: No erythema or rash.     Comments: Upper back midline 1 x 1cm approx raised brown stuck on appearing variable texture consistent with seborrheic keratosis  Neurological:     Mental Status: He is alert and oriented to person, place, and time.  Psychiatric:        Behavior: Behavior normal.     Comments: Well groomed, good eye contact, normal speech and thoughts      Recent Labs    07/03/19 0908 10/08/19 1128 02/11/20 1416  HGBA1C 8.1* 7.3* 6.8*     Results for orders placed or performed in visit on 02/11/20  POCT glycosylated hemoglobin (Hb A1C)  Result Value Ref Range   Hemoglobin A1C 6.8 (A) 4.0 - 5.6 %   HbA1c POC (<> result, manual entry)     HbA1c, POC (prediabetic range)     HbA1c, POC (controlled diabetic range)    POCT urinalysis dipstick  Result Value Ref Range   Color, UA amber    Clarity, UA clear    Glucose, UA Positive (A) Negative   Bilirubin, UA negative    Ketones, UA negative    Spec Grav, UA 1.015 1.010 - 1.025   Blood, UA negative    pH, UA 5.0 5.0 - 8.0   Protein,  UA Positive (A) Negative   Urobilinogen, UA 0.2 0.2 or 1.0 E.U./dL   Nitrite, UA negative    Leukocytes, UA Negative Negative   Appearance     Odor        Assessment & Plan:   Problem List Items Addressed This Visit    Type 2 diabetes mellitus with other specified complication ([place]) - Primary    Improved DM control with A1c down  to 6.8 (from 7.3) on higher dose GLP1 Weight loss Complications - some early peripheral neuropathy, obesity - increases risk of future cardiovascular complications   Plan:  1. Continue Ozempic 1mg  weekly injecation 2. Continue to Reduce NPH insulin based on fasting CBG goal < 150, now < 20 units often advise can eventually lower further and may discontinue if possible - Continue Metformin 1000mg  BID - Continue Jardiance 25mg  daily - may reconsider SGLT2 med if has urinary symptoms 2. Encourage improved lifestyle - low carb, low sugar diet, reduce portion size, continue improving regular exercise 3. Check CBG, bring log to next visit for review 4. Continue ASA, ARB, Statin      Relevant Orders   POCT glycosylated hemoglobin (Hb A1C) (Completed)   Tubular adenoma of colon    Multiple polyps on last colonoscopy 01/2020 Had negative cologuard 07/2019  One large polyp 40 mm or 4 cm - only partial excision, negative for high grade dysplasia, now awaiting management by [facility] for larger polyp treatment      Obesity (BMI 30.0-34.9)    Improved wt loss BMI down to 34 On GLP1 improving lifestyle      Benign prostatic hyperplasia with incomplete bladder emptying    Stable chronic BPH with LUTS with incomplete emptying - On Flomax 0.4mg , Finasteride 5mg , failed flomax 0.8 - Last PSA not on file - No known personal/family history of prostate CA  Plan: 1. Continue current regimen Tamsulosin 0.4mg  daily, Finasteride 5mg  daily - however discussion today on other options, limited other med changes, we can check UA dipstick today and urine culture, given he  is on SGLT2 can increase risk of UTi / yeast infection  Dipstick was negative, only positive glucose on SGLT2 and protein  Advised future consider refer to Urology if interested in other options.      Relevant Orders   POCT urinalysis dipstick (Completed)   Urine Culture    Other Visit Diagnoses    History of skin cancer       Relevant Orders   Ambulatory referral to Dermatology   Seborrheic keratosis       Relevant Orders   Ambulatory referral to Dermatology   Dysuria       Relevant Orders   POCT urinalysis dipstick (Completed)   Urine Culture      #Dysuria, see A&P above  #History of skin cancer Prior x 2 lesions on face/nose and scalp, removed by prior dermatology in [place] Will refer to local Dermatology for evaluation and management for future skin cancer surveillance, and he has SK on back.   Orders Placed This Encounter  Procedures  . Urine Culture  . Ambulatory referral to Dermatology    Referral Priority:   Routine    Referral Type:   Consultation    Referral Reason:   Specialty Services Required    Requested Specialty:   Dermatology    Number of Visits Requested:   1  . POCT glycosylated hemoglobin (Hb A1C)  . POCT urinalysis dipstick     No orders of the defined types were placed in this encounter.   Follow up plan: Return in about 5 months (around 07/13/2020) for Annual Physical.  Future labs ordered for 06/2020 - [facility] orders for annual add urine  Nobie Putnam, [facility] 02/11/2020, 1:33 PM

## 2020-02-11 NOTE — Patient Instructions (Addendum)
Thank you for coming to the office today.  [place]   [place], [place] [address] Hours: 8AM-5PM Phone: [phone]  We will check A1c sugar today.  Urine today stay tuned for results, Culture - if shows bacteria we can treat with antibiotic.  Otherwise most likely due to enlarged prostate.  Restart Ozempic.  -----------------------------------------------------------  For next time - before physical  DUE for FASTING BLOOD WORK (no food or drink after midnight before the lab appointment, only water or coffee without cream/sugar on the morning of)  [facility] - message me 1 week prior to ready to get labs, then we can order [facility] tests, if you want the urine checked let me know  Please schedule a Follow-up Appointment to: Return in about 5 months (around 07/13/2020) for Annual Physical.  If you have any other questions or concerns, please feel free to call the office or send a message through [place]. You may also schedule an earlier appointment if necessary.  Additionally, you may be receiving a survey about your experience at our office within a few days to 1 week by e-mail or mail. We value your feedback.  Nobie Putnam, DO [place]

## 2020-02-11 NOTE — Assessment & Plan Note (Addendum)
Stable chronic BPH with LUTS with incomplete emptying - On Flomax 0.4mg , Finasteride 5mg , failed flomax 0.8 - Last PSA not on file - No known personal/family history of prostate CA  Plan: 1. Continue current regimen Tamsulosin 0.4mg  daily, Finasteride 5mg  daily - however discussion today on other options, limited other med changes, we can check UA dipstick today and urine culture, given he is on SGLT2 can increase risk of UTi / yeast infection  Dipstick was negative, only positive glucose on SGLT2 and protein  Advised future consider refer to Urology if interested in other options.

## 2020-02-11 NOTE — Assessment & Plan Note (Signed)
Improved DM control with A1c down to 6.8 (from 7.3) on higher dose GLP1 Weight loss Complications - some early peripheral neuropathy, obesity - increases risk of future cardiovascular complications   Plan:  1. Continue Ozempic 1mg  weekly injecation 2. Continue to Reduce NPH insulin based on fasting CBG goal < 150, now < 20 units often advise can eventually lower further and may discontinue if possible - Continue Metformin 1000mg  BID - Continue Jardiance 25mg  daily - may reconsider SGLT2 med if has urinary symptoms 2. Encourage improved lifestyle - low carb, low sugar diet, reduce portion size, continue improving regular exercise 3. Check CBG, bring log to next visit for review 4. Continue ASA, ARB, Statin

## 2020-02-11 NOTE — Telephone Encounter (Signed)
-----   Message from Irving Copas., MD sent at 02/06/2020  9:14 PM EDT ----- Regarding: RE: Re: Referral, complex polypectomy RV, Thanks for the referral.  Happy to entertain and try to get this taken care of for the patient.  Ideally let things settle for at least 4 to 6 weeks and then we can try to get him done.Challis Crill or covering RN please move forward with scheduling this patient a clinic visit and start looking for a colonoscopy with EMR slot in approximately 6 to 10 weeks.Thanks.GM ----- Message ----- From: Lin Landsman, MD Sent: 02/06/2020  10:14 AM EDT To: Irving Copas., MD, # Subject: Re: Referral, complex polypectomy              Caryl Pina  Please refer pt to Dr Jerilynn Mages for complex polypectomy, cecum  Wynetta Fines  He will also need 2 day prep  Thanks Rohini

## 2020-02-11 NOTE — Telephone Encounter (Signed)
The pt has an appt for 4-1 to discuss.  Pt aware

## 2020-02-11 NOTE — Assessment & Plan Note (Signed)
Multiple polyps on last colonoscopy 01/2020 Had negative cologuard 07/2019  One large polyp 40 mm or 4 cm - only partial excision, negative for high grade dysplasia, now awaiting management by [facility] for larger polyp treatment

## 2020-02-13 LAB — SPECIMEN STATUS REPORT

## 2020-02-13 LAB — URINE CULTURE: Organism ID, Bacteria: NO GROWTH

## 2020-02-14 ENCOUNTER — Encounter: Payer: Self-pay | Admitting: Gastroenterology

## 2020-02-14 ENCOUNTER — Ambulatory Visit: Payer: Medicare HMO | Admitting: Gastroenterology

## 2020-02-14 ENCOUNTER — Telehealth: Payer: Self-pay | Admitting: Gastroenterology

## 2020-02-14 VITALS — BP 120/60 | HR 80 | Ht 69.0 in | Wt 243.4 lb

## 2020-02-14 DIAGNOSIS — K635 Polyp of colon: Secondary | ICD-10-CM

## 2020-02-14 DIAGNOSIS — Z86010 Personal history of colonic polyps: Secondary | ICD-10-CM

## 2020-02-14 DIAGNOSIS — K5900 Constipation, unspecified: Secondary | ICD-10-CM | POA: Diagnosis not present

## 2020-02-14 DIAGNOSIS — R933 Abnormal findings on diagnostic imaging of other parts of digestive tract: Secondary | ICD-10-CM

## 2020-02-14 NOTE — Progress Notes (Signed)
[place] VISIT   Primary Care Provider [name], DO [place] [address] [phone]  Referring Provider Dr. Marius Ditch  Patient Profile: Jonathan Shelton is a 69 y.o. male with a pmh significant for nephrolithiasis, diabetes, BPH, obesity, status post cholecystectomy, inguinal hernia repair, constipation, diverticulosis, adenomatous colon polyps.  The patient presents to the [facility] for an evaluation and management of problem(s) noted below:  Problem List 1. Cecal polyp   2. Hx of adenomatous polyp of colon   3. Abnormal colonoscopy   4. Constipation, unspecified constipation type     History of Present Illness This is the patient's first visit to the outpatient [place] clinic.  The patient met Dr. Marius Ditch at the beginning of March in evaluation of rectal bleeding as well as chronic constipation.  He had described issues of constipation for years as well as a history of previous polyps and undergoing colonoscopy every 2 or 3 years.  He had noticed some increasing blood while wiping over the course of the period in time leading to his clinic visit.  Fiber supplementation as well as MiraLAX and consideration of hemorrhoidal banding was discussed.  A colonoscopy was performed with findings of a large cecal lesion as well as multiple other colon polyps.  An attempt at cecal polyp resection was performed with lift agent however resection was incomplete and the preparation of the colonoscopy overall was felt to be incomplete.  It is for this reason that the patient is referred for consideration of advanced polyp resection.  Today, the patient is accompanied by his wife.  Initially they had thought that I was a Sports administrator but I described to them the role that I have as an advanced endoscopist.  He has continued to have some minor rectal bleeding.  He continues to have constipation.  Patient does not have any  significant abdominal pain.  He is not on any blood thinners.  GI Review of Systems Positive as above Negative for pyrosis, dysphagia, odynophagia, bloating, melena  Review of Systems General: Denies fevers/chills/weight loss HEENT: Denies oral lesions Cardiovascular: Denies chest pain/palpitations Pulmonary: Denies shortness of breath/cough Gastroenterological: See HPI Genitourinary: Denies darkened urine or hematuria Hematological: Denies easy bruising/bleeding Endocrine: Denies temperature intolerance Dermatological: Denies jaundice Psychological: Mood is mildly anxious about finding the best way to have this polyp removed   Medications Current Outpatient Medications  Medication Sig Dispense Refill  . albuterol (VENTOLIN HFA) 108 (90 Base) MCG/ACT inhaler SMARTSIG:1 Puff(s) By Mouth Every 4 Hours PRN    . aspirin 81 MG chewable tablet Chew by mouth.    Marland Kitchen atorvastatin (LIPITOR) 10 MG tablet Take 1 tablet (10 mg total) by mouth daily at 6 PM. 90 tablet 1  . bifidobacterium infantis (ALIGN) capsule Take 1 capsule by mouth daily.    Marland Kitchen CRANBERRY FRUIT PO Take by mouth.    . finasteride (PROSCAR) 5 MG tablet Take 1 tablet (5 mg total) by mouth daily. 90 tablet 1  . insulin NPH Human (NOVOLIN N) 100 UNIT/ML injection Inject 0.45-0.6 mLs (45-60 Units total) into the skin at bedtime. 10 mL 0  . JARDIANCE 25 MG TABS tablet TAKE 1 TABLET BY MOUTH EVERY DAY 90 tablet 1  . metFORMIN (GLUCOPHAGE) 1000 MG tablet Take 1 tablet (1,000 mg total) by mouth 2 (two) times daily with a meal. 180 tablet 1  . OZEMPIC, 1 MG/DOSE, 2 MG/1.5ML SOPN Inject 1 mg into the skin once a week. 2  pen 5  . polyethylene glycol powder (GLYCOLAX/MIRALAX) 17 GM/SCOOP powder Take 17 g by mouth daily as needed for mild constipation or moderate constipation. Can increase to 2 doses a day or more if needed. 255 g 1  . psyllium (METAMUCIL) 58.6 % packet Take 1 packet by mouth daily.    . tamsulosin (FLOMAX) 0.4 MG CAPS  capsule Take 1 capsule (0.4 mg total) by mouth daily after supper. 90 capsule 1  . triamcinolone cream (KENALOG) 0.1 % APPLY TO AFFECTED AREA TWICE A DAY AS NEEDED    . valsartan (DIOVAN) 80 MG tablet Take 1 tablet (80 mg total) by mouth daily. 90 tablet 1  . vitamin E 400 UNIT capsule Take 400 Units by mouth daily.     No current facility-administered medications for this visit.    Allergies Allergies  Allergen Reactions  . Testosterone Rash    patch    Histories Past Medical History:  Diagnosis Date  . Allergy   . Colon polyps   . Diabetes ([place])   . Diverticulosis   . Gallstones   . Kidney stones   . Renal disorder    Past Surgical History:  Procedure Laterality Date  . CARPAL TUNNEL RELEASE    . CHOLECYSTECTOMY    . COLONOSCOPY WITH PROPOFOL N/A 02/06/2020   Procedure: COLONOSCOPY WITH PROPOFOL;  Surgeon: Lin Landsman, MD;  Location: [facility] END[facility];  Service: Gastroenterology;  Laterality: N/A;  . HERNIA REPAIR    . SMALL INTESTINE SURGERY     Social History   Socioeconomic History  . Marital status: Married    Spouse name: Not on file  . Number of children: Not on file  . Years of education: Graduate Degree  . Highest education level: Master's degree (e.g., MA, MS, MEng, MEd, MSW, MBA)  Occupational History  . Not on file  Tobacco Use  . Smoking status: Former Smoker    Types: Cigarettes    Quit date: 1970    Years since quitting: 51.2  . Smokeless tobacco: Former Network engineer and Sexual Activity  . Alcohol use: Not Currently  . Drug use: Yes    Comment: past  . Sexual activity: Not on file  Other Topics Concern  . Not on file  Social History Narrative  . Not on file   Social Determinants of Health   Financial Resource Strain:   . Difficulty of Paying Living Expenses:   Food Insecurity:   . Worried About Charity fundraiser in the Last Year:   . Arboriculturist in the Last Year:   Transportation Needs:   . Film/video editor  (Medical):   Marland Kitchen Lack of Transportation (Non-Medical):   Physical Activity:   . Days of Exercise per Week:   . Minutes of Exercise per Session:   Stress:   . Feeling of Stress :   Social Connections:   . Frequency of Communication with Friends and Family:   . Frequency of Social Gatherings with Friends and Family:   . Attends Religious Services:   . Active Member of Clubs or Organizations:   . Attends Archivist Meetings:   Marland Kitchen Marital Status:   Intimate Partner Violence:   . Fear of Current or Ex-Partner:   . Emotionally Abused:   Marland Kitchen Physically Abused:   . Sexually Abused:    Family History  Problem Relation Age of Onset  . Heart disease Mother 54  . Heart disease Father   . Stroke Father  77  . Diabetes Father   . Heart attack Father   . Liver disease Neg Hx   . Colon cancer Neg Hx   . Esophageal cancer Neg Hx   . Pancreatic cancer Neg Hx   . Stomach cancer Neg Hx   . Inflammatory bowel disease Neg Hx   . Rectal cancer Neg Hx    I have reviewed his medical, social, and family history in detail and updated the electronic medical record as necessary.    PHYSICAL EXAMINATION  BP 120/60   Pulse 80   Ht 5' 9"  (1.753 m)   Wt 243 lb 6.4 oz (110.4 kg)   BMI 35.94 kg/m  Wt Readings from Last 3 Encounters:  02/14/20 243 lb 6.4 oz (110.4 kg)  02/11/20 239 lb (108.4 kg)  02/06/20 233 lb (105.7 kg)  GEN: NAD, appears stated age, doesn't appear chronically ill, accompanied by wife PSYCH: Cooperative, without pressured speech EYE: Conjunctivae pink, sclerae anicteric ENT: MMM, without oral ulcers, no erythema or exudates noted CV: Nontachycardic RESP: CTAB posteriorly, without wheezing GI: NABS, soft, NT/ND, rounded, without rebound or guarding, no HSM appreciated MSK/EXT: No significant lower extremity edema SKIN: No jaundice NEURO:  Alert & Oriented x 3, no focal deficits   REVIEW OF DATA  I reviewed the following data at the time of this encounter:  GI  Procedures and Studies  March 2021 colonoscopy - Three 4 to 5 mm polyps in the transverse colon, removed with a cold snare. Resected and retrieved. - One 40 mm polyp in the cecum, removed with mucosal resection. Incomplete resection. Resected tissue retrieved. Injected. - Diverticulosis in the sigmoid colon. - Non-bleeding external and internal hemorrhoids. - Mucosal resection was performed. Resection was incomplete. The resected tissue was retrieved. Pathology DIAGNOSIS:  A. COLON POLYP X3, TRANSVERSE; COLD SNARE:  - TUBULAR ADENOMA, TWO FRAGMENTS.  - INTRAMUCOSAL LYMPHOID AGGREGATE, ONE FRAGMENT.  - NEGATIVE FOR HIGH-GRADE DYSPLASIA AND MALIGNANCY.  B. COLON POLYP, CECUM; HOT SNARE:  - TUBULAR ADENOMA.  - NEGATIVE FOR HIGH-GRADE DYSPLASIA AND MALIGNANCY.  Laboratory Studies  Reviewed those in epic  Imaging Studies  No relevant studies to review   ASSESSMENT  Mr. Leider is a 69 y.o. male  with a pmh significant for nephrolithiasis, diabetes, BPH, obesity, status post cholecystectomy, inguinal hernia repair, constipation, diverticulosis, adenomatous colon polyps.  The patient is seen today for evaluation and management of:  1. Cecal polyp   2. Hx of adenomatous polyp of colon   3. Abnormal colonoscopy   4. Constipation, unspecified constipation type    The patient is hemodynamically and clinically stable at this time.  The patient's large cecal polyp with partial resection returned as a tubular adenoma without high-grade dysplasia.  Based upon the description and endoscopic pictures I do feel that it is reasonable to pursue an Advanced Polypectomy attempt of the polyp/lesion.  This will be potentially more challenging as a result of fibrosis that may be in place as a result of the recent endoscopic resection attempt.  We discussed some of the techniques of advanced polypectomy which include Endoscopic Mucosal Resection, OVESCO Full-Thickness Resection, Endorotor Morcellation, and  Tissue Ablation via Fulguration.  The risks and benefits of endoscopic evaluation were discussed with the patient; these include but are not limited to the risk of perforation, infection, bleeding, missed lesions, lack of diagnosis, severe illness requiring hospitalization, as well as anesthesia and sedation related illnesses.  During attempts at advanced resection, the risks of bleeding and perforation/leak are  increased as opposed to diagnostic and screening procedures, and that was discussed with the patient as well.   In addition, I explained that with the very likely need for piecemeal resection, subsequent short-interval endoscopic evaluation for follow up and potential retreatment of the lesion/area may be necessary.  I did offer, a referral to surgery in order for patient to have opportunity to discuss surgical management/intervention prior to finalizing decision for attempt at endoscopic removal, however, the patient deferred on this.  If, after attempt at removal of the polyp/lesion, it is found that the patient has a complication or that an invasive lesion or malignant lesion is found, or that the polyp/lesion continues to recur, the patient is aware and understands that surgery may still be indicated/required.  I would like the area to heal for at least 6 to 8 weeks before we attempt a colonoscopy.  Preparation will be adjusted in order of trying to optimize his bowel habits and optimize his visualization.  All patient questions were answered, to the best of my ability, and the patient agrees to the aforementioned plan of action with follow-up as indicated.   PLAN  Laboratories as outlined below for preprocedural evaluation Proceed with scheduling colonoscopy with EMR 2-hour slot Please take MiraLAX 1-2 times daily for 1 week prior to colonoscopy Please take 10 mg of Dulcolax 2 days before your procedure and 1 day before your procedure Preparation instructions otherwise   Orders Placed This  Encounter  Procedures  . Procedural/ Surgical Case Request: COLONOSCOPY WITH PROPOFOL, ENDOSCOPIC MUCOSAL RESECTION  . CBC  . Basic Metabolic Panel (BMET)  . INR/PT  . Ambulatory referral to Gastroenterology    New Prescriptions   No medications on file   Modified Medications   No medications on file    Planned Follow Up No follow-ups on file.   Total Time in Face-to-Face and in Coordination of Care for patient including independent/personal interpretation/review of prior testing, medical history, examination, medication adjustment, communicating results with the patient directly, and documentation with the EHR is 45 minutes.   Justice Britain, MD [facility] Advanced Endoscopy Office # [phone]

## 2020-02-14 NOTE — Patient Instructions (Addendum)
You have been scheduled for a colonoscopy. Please follow written instructions given to you at your visit today.  Please pick up your prep supplies at the pharmacy within the next 1-3 days.( sample of Suprep given in office today.) If you use inhalers (even only as needed), please bring them with you on the day of your procedure.   If you are age 69 or older, your body mass index should be between 23-30. Your Body mass index is 35.94 kg/m. If this is out of the aforementioned range listed, please consider follow up with your Primary Care Provider.  If you are age 44 or younger, your body mass index should be between 19-25. Your Body mass index is 35.94 kg/m. If this is out of the aformentioned range listed, please consider follow up with your Primary Care Provider.   Due to recent changes in healthcare laws, you may see the results of your imaging and laboratory studies on MyChart before your provider has had a chance to review them.  We understand that in some cases there may be results that are confusing or concerning to you. Not all laboratory results come back in the same time frame and the provider may be waiting for multiple results in order to interpret others.  Please give [place] 48 hours in order for your provider to thoroughly review all the results before contacting the office for clarification of your results.    Lab order will be faxed today- at your request labs will be done at [facility].   Start Miralax - 1 capful daily -1 week before procedure.    Due to recent COVID-19 restrictions implemented by our local and state authorities and in an effort to keep both patients and staff as safe as possible, our hospital system now requires COVID-19 testing prior to any scheduled hospital procedure. Please go to our [place] location on [address], [place], [place] on 03/27/2020 at 10:30 am. Follow the TEAL SIGNS that say "pre-admit." There are two tent sites in the area so follow the  pre-admit teal signs.You will not be billed at the time of testing but may receive a bill later depending on your insurance. The approximate cost of the test is $100. You must agree to quarantine from the time of your testing until the procedure date on 03/31/20 . This should include staying at home with ONLY the people you live with. Avoid take-out, grocery store shopping or leaving the house for any non-emergent reason. Failure to have your COVID-19 test done on the date and time you have been scheduled will result in cancellation of procedure. Please call our office at [phone] if you have any questions.   _x  _   ORAL DIABETIC MEDICATION INSTRUCTIONS  The day before your procedure:  Take your diabetic pill as you do normally  The day of your procedure:  Do not take your diabetic pill   We will check your blood sugar levels during the admission process and again in Recovery before discharging you home      _  x_   INSULIN (SHORT ACTING) MEDICATION INSTRUCTIONS          Novolin  The day before your procedure:  Do not take your evening dose   The day of your procedure:  Do not take your morning dose      Thank you for choosing me and [facility]. Dr. Rush Landmark

## 2020-02-14 NOTE — Telephone Encounter (Signed)
Returned wife's call. Had to leave message.

## 2020-02-15 ENCOUNTER — Encounter: Payer: Self-pay | Admitting: Gastroenterology

## 2020-02-15 DIAGNOSIS — Z86010 Personal history of colonic polyps: Secondary | ICD-10-CM | POA: Insufficient documentation

## 2020-02-15 DIAGNOSIS — K635 Polyp of colon: Secondary | ICD-10-CM | POA: Insufficient documentation

## 2020-02-15 DIAGNOSIS — R933 Abnormal findings on diagnostic imaging of other parts of digestive tract: Secondary | ICD-10-CM | POA: Insufficient documentation

## 2020-02-15 DIAGNOSIS — Z860101 Personal history of adenomatous and serrated colon polyps: Secondary | ICD-10-CM | POA: Insufficient documentation

## 2020-03-03 ENCOUNTER — Other Ambulatory Visit: Payer: Self-pay | Admitting: Family Medicine

## 2020-03-03 DIAGNOSIS — E1169 Type 2 diabetes mellitus with other specified complication: Secondary | ICD-10-CM

## 2020-03-03 DIAGNOSIS — R3914 Feeling of incomplete bladder emptying: Secondary | ICD-10-CM

## 2020-03-03 DIAGNOSIS — N401 Enlarged prostate with lower urinary tract symptoms: Secondary | ICD-10-CM

## 2020-03-03 DIAGNOSIS — Z794 Long term (current) use of insulin: Secondary | ICD-10-CM

## 2020-03-03 MED ORDER — INSULIN NPH (HUMAN) (ISOPHANE) 100 UNIT/ML ~~LOC~~ SUSP: 45.0000 [IU] | Freq: Every day | SUBCUTANEOUS | 0 refills | Status: DC

## 2020-03-03 MED ORDER — METFORMIN HCL 1000 MG PO TABS: 1000.0000 mg | ORAL_TABLET | Freq: Two times a day (BID) | ORAL | 1 refills | Status: DC

## 2020-03-03 MED ORDER — ATORVASTATIN CALCIUM 10 MG PO TABS: 10.0000 mg | ORAL_TABLET | Freq: Every day | ORAL | 1 refills | Status: DC

## 2020-03-03 MED ORDER — TAMSULOSIN HCL 0.4 MG PO CAPS: 0.4000 mg | ORAL_CAPSULE | Freq: Every day | ORAL | 1 refills | Status: DC

## 2020-03-03 NOTE — Telephone Encounter (Signed)
insulin NPH Human (NOVOLIN N) 100 UNIT/ML injection  atorvastatin (LIPITOR) 10 MG tablet  metFORMIN (GLUCOPHAGE) 1000 MG tamsulosin (FLOMAX) 0.4 MG CAPS     Patient is requesting refills.    [facility]  [id number] [name], [place] Phone:  [phone]  Fax:  [phone]

## 2020-03-03 NOTE — Telephone Encounter (Signed)
Requested Prescriptions  Pending Prescriptions Disp Refills  . tamsulosin (FLOMAX) 0.4 MG CAPS capsule 90 capsule 1    Sig: Take 1 capsule (0.4 mg total) by mouth daily after supper.     Urology: Alpha-Adrenergic Blocker Passed - 03/03/2020 10:49 AM      Passed - Last BP in normal range    BP Readings from Last 1 Encounters:  02/14/20 120/60         Passed - Valid encounter within last 12 months    Recent Outpatient Visits          3 weeks ago Type 2 diabetes mellitus with other specified complication, with long-term current use of insulin [facility])   [place], DO   2 months ago Rectal bleeding   [place], DO   4 months ago Type 2 diabetes mellitus with other specified complication, with long-term current use of insulin [facility])   [place], DO   8 months ago Annual physical exam   [facility] [name], DO   9 months ago Type 2 diabetes mellitus with other specified complication, with long-term current use of insulin [facility])   [facility] [name], DO      Future Appointments            In 3 weeks Ralene Bathe, MD [place]           . atorvastatin (LIPITOR) 10 MG tablet 90 tablet 1    Sig: Take 1 tablet (10 mg total) by mouth daily at 6 PM.     Cardiovascular:  Antilipid - Statins Failed - 03/03/2020 10:49 AM      Failed - Total Cholesterol in normal range and within 360 days    No results found for: CHOL, POCCHOL, CHOLTOT       Failed - LDL in normal range and within 360 days    No results found for: LDLCALC, LDLC, HIRISKLDL, POCLDL, LDLDIRECT, REALLDLC, TOTLDLC       Failed - HDL in normal range and within 360 days    No results found for: HDL, POCHDL       Failed - Triglycerides in normal range and within 360 days    No results found for: TRIG, POCTRIG       Passed  - Patient is not pregnant      Passed - Valid encounter within last 12 months    Recent Outpatient Visits          3 weeks ago Type 2 diabetes mellitus with other specified complication, with long-term current use of insulin ([place])   [place], Devonne Doughty, DO   2 months ago Rectal bleeding   [place], DO   4 months ago Type 2 diabetes mellitus with other specified complication, with long-term current use of insulin [facility])   [place], DO   8 months ago Annual physical exam   [facility] [name], DO   9 months ago Type 2 diabetes mellitus with other specified complication, with long-term current use of insulin [facility])   [facility] [name], DO      Future Appointments            In 3 weeks Ralene Bathe, MD [place]           .  insulin NPH Human (NOVOLIN N) 100 UNIT/ML injection 10 mL 0    Sig: Inject 0.45-0.6 mLs (45-60 Units total) into the skin at bedtime.     Endocrinology:  Diabetes - Insulins Passed - 03/03/2020 10:49 AM      Passed - HBA1C is between 0 and 7.9 and within 180 days    Hemoglobin A1C  Date Value Ref Range Status  02/11/2020 6.8 (A) 4.0 - 5.6 % Final   Hgb A1c MFr Bld  Date Value Ref Range Status  07/03/2019 8.1 (H) <5.7 % of total Hgb Final    Comment:    For someone without known diabetes, a hemoglobin A1c value of 6.5% or greater indicates that they may have  diabetes and this should be confirmed with a follow-up  test. . For someone with known diabetes, a value <7% indicates  that their diabetes is well controlled and a value  greater than or equal to 7% indicates suboptimal  control. A1c targets should be individualized based on  duration of diabetes, age, comorbid conditions, and  other considerations. . Currently, no consensus exists regarding use  of hemoglobin A1c for diagnosis of diabetes for children. Renella Cunas - Valid encounter within last 6 months    Recent Outpatient Visits          3 weeks ago Type 2 diabetes mellitus with other specified complication, with long-term current use of insulin [facility])   [facility] [name], DO   2 months ago Rectal bleeding   [place], DO   4 months ago Type 2 diabetes mellitus with other specified complication, with long-term current use of insulin [facility])   [place], DO   8 months ago Annual physical exam   [facility] [name], DO   9 months ago Type 2 diabetes mellitus with other specified complication, with long-term current use of insulin [facility])   [facility] [name], DO      Future Appointments            In 3 weeks Ralene Bathe, MD [place]           . metFORMIN (GLUCOPHAGE) 1000 MG tablet 180 tablet 1    Sig: Take 1 tablet (1,000 mg total) by mouth 2 (two) times daily with a meal.     Endocrinology:  Diabetes - Biguanides Passed - 03/03/2020 10:49 AM      Passed - Cr in normal range and within 360 days    Creat  Date Value Ref Range Status  07/03/2019 0.85 0.70 - 1.25 mg/dL Final    Comment:    For patients >9 years of age, the reference limit for Creatinine is approximately 13% higher for people identified as African-American. .          Passed - HBA1C is between 0 and 7.9 and within 180 days    Hemoglobin A1C  Date Value Ref Range Status  02/11/2020 6.8 (A) 4.0 - 5.6 % Final   Hgb A1c MFr Bld  Date Value Ref Range Status  07/03/2019 8.1 (H) <5.7 % of total Hgb Final    Comment:    For someone without known diabetes, a hemoglobin A1c value of 6.5% or greater indicates that they may have  diabetes and this should be confirmed with a follow-up   test. .  For someone with known diabetes, a value <7% indicates  that their diabetes is well controlled and a value  greater than or equal to 7% indicates suboptimal  control. A1c targets should be individualized based on  duration of diabetes, age, comorbid conditions, and  other considerations. . Currently, no consensus exists regarding use of hemoglobin A1c for diagnosis of diabetes for children. .          Passed - eGFR in normal range and within 360 days    GFR, Est African American  Date Value Ref Range Status  07/03/2019 104 > OR = 60 mL/min/1.53m Final   GFR, Est Non African American  Date Value Ref Range Status  07/03/2019 90 > OR = 60 mL/min/1.780mFinal         Passed - Valid encounter within last 6 months    Recent Outpatient Visits          3 weeks ago Type 2 diabetes mellitus with other specified complication, with long-term current use of insulin ([facility]  [place]   2 months ago Rectal bleeding   [place]   4 months ago Type 2 diabetes mellitus with other specified complication, with long-term current use of insulin ([facility]  [facility]   8 months ago Annual physical exam   [facility]   9 months ago Type 2 diabetes mellitus with other specified complication, with long-term current use of insulin ([facility]  [facility] [place] DoughtyDO      Future Appointments            In 3 weeks [place]ene [place]

## 2020-03-04 NOTE — Telephone Encounter (Signed)
Returned wife's call again. They will keep 03/31/20 appointment. Appointment time for procedure was more convenient than 04/02/20 appointment time at the hospital.

## 2020-03-04 NOTE — Telephone Encounter (Signed)
Spoke with wife and they would like to keep 03/31/20 appt for procedure.

## 2020-03-04 NOTE — Telephone Encounter (Signed)
Patient's wife called would like to know if 04/02/20 is still available

## 2020-03-10 ENCOUNTER — Other Ambulatory Visit: Payer: Self-pay | Admitting: Family Medicine

## 2020-03-10 DIAGNOSIS — E1169 Type 2 diabetes mellitus with other specified complication: Secondary | ICD-10-CM

## 2020-03-10 MED ORDER — INSULIN NPH (HUMAN) (ISOPHANE) 100 UNIT/ML ~~LOC~~ SUSP: 45.0000 [IU] | Freq: Every day | SUBCUTANEOUS | 0 refills | Status: DC

## 2020-03-10 NOTE — Telephone Encounter (Signed)
Medication Refill - Medication: insulin NPH Human (NOVOLIN N) 100 UNIT/ML injection  90 day supply needed, please advise   Has the patient contacted their pharmacy? Yes.   (Agent: If no, request that the patient contact the pharmacy for the refill.) (Agent: If yes, when and what did the pharmacy advise?)  Preferred Pharmacy (with phone number or street name):  [id number] [name] [place]  [place] [address]  Phone: [phone] Fax: [phone]     Agent: Please be advised that RX refills may take up to 3 business days. We ask that you follow-up with your pharmacy.

## 2020-03-11 ENCOUNTER — Other Ambulatory Visit: Payer: Self-pay | Admitting: Family Medicine

## 2020-03-11 DIAGNOSIS — Z794 Long term (current) use of insulin: Secondary | ICD-10-CM

## 2020-03-11 DIAGNOSIS — E1169 Type 2 diabetes mellitus with other specified complication: Secondary | ICD-10-CM

## 2020-03-11 MED ORDER — OZEMPIC (1 MG/DOSE) 2 MG/1.5ML ~~LOC~~ SOPN: 1.0000 mg | PEN_INJECTOR | SUBCUTANEOUS | 5 refills | Status: DC

## 2020-03-11 NOTE — Telephone Encounter (Signed)
OZEMPIC, 1 MG/DOSE, 2 MG/1.5ML SOPN     Patient is requesting refill.    Pharmacy:  [id number] [name], [place] Phone:  [phone]  Fax:  [phone]

## 2020-03-12 ENCOUNTER — Telehealth: Payer: Self-pay | Admitting: Family Medicine

## 2020-03-12 DIAGNOSIS — E1169 Type 2 diabetes mellitus with other specified complication: Secondary | ICD-10-CM

## 2020-03-12 DIAGNOSIS — Z794 Long term (current) use of insulin: Secondary | ICD-10-CM

## 2020-03-12 MED ORDER — INSULIN NPH (HUMAN) (ISOPHANE) 100 UNIT/ML ~~LOC~~ SUSP: 45.0000 [IU] | Freq: Every day | SUBCUTANEOUS | 0 refills | Status: DC

## 2020-03-12 MED ORDER — INSULIN NPH (HUMAN) (ISOPHANE) 100 UNIT/ML ~~LOC~~ SUSP: 20.0000 [IU] | Freq: Every day | SUBCUTANEOUS | 1 refills | Status: DC

## 2020-03-12 NOTE — Addendum Note (Signed)
Addended by: Dimple Nanas on: 03/12/2020 04:21 PM   Modules accepted: Orders

## 2020-03-12 NOTE — Telephone Encounter (Signed)
Pt stated he was supposed to have a 90 day supply for his insulin NPH Human (NOVOLIN N) 100 UNIT/ML injection  But received only one vial. Stated he always receives a 90 day supply and requested when he called in his refill. He is very upset and wants a callback explaining why this happened and the correct refill to be sent to his pharmacy. Stated he only has enough to last two days. Please advise.  [id number] [name], [place] [address]  Phone: [phone] Fax: [phone]

## 2020-03-13 ENCOUNTER — Telehealth: Payer: Self-pay

## 2020-03-13 NOTE — Telephone Encounter (Signed)
Called the patient unable to reach the Rx was [facility] on [facility]12[facility]2021 for 90 days.

## 2020-03-13 NOTE — Telephone Encounter (Signed)
Copied from [place] [phone]. Topic: General - Other >> Mar 13, 2020  2:32 PM Jonathan Shelton A wrote: Reason for CRM: Patient called to inquire of Dr Raliegh Ip what the issue is with him not being able to have the 90 day supply of all his medications called in. Patient is willing to do a virtual visit in order to get an explanation for the drastic change in how his medicine is being ordered at the pharmacy. Patient need insulin NPH Human (NOVOLIN N) 100 UNIT/ML injection called in right away because he will be out by the end of the weekend. Please advise  Ph# [phone] or [phone]

## 2020-03-14 ENCOUNTER — Other Ambulatory Visit: Payer: Self-pay | Admitting: Family Medicine

## 2020-03-14 DIAGNOSIS — E1169 Type 2 diabetes mellitus with other specified complication: Secondary | ICD-10-CM

## 2020-03-14 DIAGNOSIS — Z794 Long term (current) use of insulin: Secondary | ICD-10-CM

## 2020-03-14 MED ORDER — INSULIN NPH (HUMAN) (ISOPHANE) 100 UNIT/ML ~~LOC~~ SUSP: 40.0000 [IU] | Freq: Every day | SUBCUTANEOUS | 1 refills | Status: DC

## 2020-03-14 NOTE — Telephone Encounter (Signed)
Rx sent and sent to Dr. Parks Ranger for review

## 2020-03-14 NOTE — Telephone Encounter (Signed)
Patient would like the nurse to him regarding his prescription.  He stated he could not wait until Monday.  CB# [phone]

## 2020-03-14 NOTE — Telephone Encounter (Signed)
The pt called requesting for a prescription 90 day prescription of Novolin to be sent over to his pharmacy. He state that the dosage amount is incorrect. He is currently taking the Novolin on a sliding scale base on how blood sugar levels are running. He said he was on 60 units of insulin when he transition his care with Dr. Parks Ranger. He admits Dr. Raliegh Ip reduced the medication when he started him on Ozempic, but it was reduced to 40units as a base. He state when his blood sugars are under 150 he takes 40 units, but when it above he takes as much as 45 units. He said his blood sugars reading are averaging between 150-250.  He prefer a 90 day script because it cost his $45.00 and 30 days script cost him $30.00.

## 2020-03-14 NOTE — Telephone Encounter (Signed)
The pt was notified that a prescription was sent over to his pharmacy with the change in his dose as requested. I also informed him that the update was sent to Dr. Parks Ranger for review.

## 2020-03-14 NOTE — Progress Notes (Signed)
Patient called office stating that he is taking his Novolin N based on his nighttime CBG readings and going anywhere from 20-45 units at bedtime, the 20 unit prescription is having him run out of medication and requested we rewrite the prescription for 40 units at bedtime so he has enough medication for the full 90 days.  I let him know I would sent in the prescription but that I would let you know so you two could re-discuss his treatment plan.

## 2020-03-17 ENCOUNTER — Telehealth: Payer: Self-pay

## 2020-03-17 NOTE — Progress Notes (Signed)
Alright. My last documentation which came from the patient reporting directly to me during the last office visit in 01/2020, was his current dose was: Novolin-N14-20units nightly, therefore, I used the high end of that dose range at 20mg  and prescribed that amount.  Nobie Putnam, [facility] 03/17/2020, 8:22 AM

## 2020-03-17 NOTE — Telephone Encounter (Signed)
Copied from [place] [phone]. Topic: General - Other >> Mar 17, 2020 12:31 PM Leward Quan A wrote: Reason for CRM: Patient and insurance company rep. called to inform Dr Raliegh Ip that the pharmacy stated that they received the wrong insulin Rx but that they need the insulin NPH Human (NOVOLIN N) 100 UNIT/ML injection. Requesting a 90 day supply please because the last Rx was incorrectly written per insurance company. Patient is needing this Rx sent to the pharmacy today please since he only has enough for one more use. Any questions please contact patient

## 2020-03-18 ENCOUNTER — Other Ambulatory Visit: Payer: Self-pay | Admitting: Family Medicine

## 2020-03-18 ENCOUNTER — Ambulatory Visit (INDEPENDENT_AMBULATORY_CARE_PROVIDER_SITE_OTHER): Payer: Medicare HMO

## 2020-03-18 ENCOUNTER — Ambulatory Visit: Payer: Medicare HMO

## 2020-03-18 VITALS — Ht 70.0 in | Wt 234.0 lb

## 2020-03-18 DIAGNOSIS — Z0000 Encounter for general adult medical examination without abnormal findings: Secondary | ICD-10-CM | POA: Diagnosis not present

## 2020-03-18 NOTE — Patient Instructions (Signed)
Jonathan Shelton , Thank you for taking time to come for your Medicare Wellness Visit. I appreciate your ongoing commitment to your health goals. Please review the following plan we discussed and let me know if I can assist you in the future.   Screening recommendations/referrals: Colonoscopy: scheduled 03/2020 Recommended yearly ophthalmology/optometry visit for glaucoma screening and checkup Recommended yearly dental visit for hygiene and checkup  Vaccinations: Influenza vaccine: up to date  Pneumococcal vaccine: declined  Tdap vaccine: due now  Shingles vaccine: shingrix eligible  Covid-19: information provided   Advanced directives: Advance directive discussed with you today.Once this is complete please bring a copy in to our office so we can scan it into your chart.  Conditions/risks identified: diabetic, please see eye dr yearly.  Your provider would like to you have your annual eye exam. Please contact your current eye doctor or here are some good options for you to contact.   [facility]         Address: [address] [place], [place] [address]    Phone: [phone]       Website: [email or web address] Address: [address] [place], [place] [address]  Phone: [phone]  Website: [email or web address]  [facility]  Address: [place], [place], [place] [address] Phone: [phone]   [facility]  Address: [place], [place], [place] [address]  Phone: [phone]   [facility] Address: [place], [place], [place] [address]  Phone: [phone]   Next appointment: Follow up in one year for your annual wellness visit.   Preventive Care 61 Years and Older, Male Preventive care refers to lifestyle choices and visits with your health care provider that can promote health and wellness. What does preventive care include?  A yearly physical exam. This is also called an annual well check.  Dental exams  once or twice a year.  Routine eye exams. Ask your health care provider how often you should have your eyes checked.  Personal lifestyle choices, including:  Daily care of your teeth and gums.  Regular physical activity.  Eating a healthy diet.  Avoiding tobacco and drug use.  Limiting alcohol use.  Practicing safe sex.  Taking low doses of aspirin every day.  Taking vitamin and mineral supplements as recommended by your health care provider. What happens during an annual well check? The services and screenings done by your health care provider during your annual well check will depend on your age, overall health, lifestyle risk factors, and family history of disease. Counseling  Your health care provider may ask you questions about your:  Alcohol use.  Tobacco use.  Drug use.  Emotional well-being.  Home and relationship well-being.  Sexual activity.  Eating habits.  History of falls.  Memory and ability to understand (cognition).  Work and work Statistician. Screening  You may have the following tests or measurements:  Height, weight, and BMI.  Blood pressure.  Lipid and cholesterol levels. These may be checked every 5 years, or more frequently if you are over 80 years old.  Skin check.  Lung cancer screening. You may have this screening every year starting at age 13 if you have a 30-pack-year history of smoking and currently smoke or have quit within the past 15 years.  Fecal occult blood test (FOBT) of the stool. You may have this test every year starting at age 38.  Flexible sigmoidoscopy or colonoscopy.  You may have a sigmoidoscopy every 5 years or a colonoscopy every 10 years starting at age 76.  Prostate cancer screening. Recommendations will vary depending on your family history and other risks.  Hepatitis C blood test.  Hepatitis B blood test.  Sexually transmitted disease (STD) testing.  Diabetes screening. This is done by checking your  blood sugar (glucose) after you have not eaten for a while (fasting). You may have this done every 1-3 years.  Abdominal aortic aneurysm (AAA) screening. You may need this if you are a current or former smoker.  Osteoporosis. You may be screened starting at age 57 if you are at high risk. Talk with your health care provider about your test results, treatment options, and if necessary, the need for more tests. Vaccines  Your health care provider may recommend certain vaccines, such as:  Influenza vaccine. This is recommended every year.  Tetanus, diphtheria, and acellular pertussis (Tdap, Td) vaccine. You may need a Td booster every 10 years.  Zoster vaccine. You may need this after age 86.  Pneumococcal 13-valent conjugate (PCV13) vaccine. One dose is recommended after age 46.  Pneumococcal polysaccharide (PPSV23) vaccine. One dose is recommended after age 59. Talk to your health care provider about which screenings and vaccines you need and how often you need them. This information is not intended to replace advice given to you by your health care provider. Make sure you discuss any questions you have with your health care provider. Document Released: 11/28/2015 Document Revised: 07/21/2016 Document Reviewed: 09/02/2015 Elsevier Interactive Patient Education  2017 [employer] Prevention in the Home Falls can cause injuries. They can happen to people of all ages. There are many things you can do to make your home safe and to help prevent falls. What can I do on the outside of my home?  Regularly fix the edges of walkways and driveways and fix any cracks.  Remove anything that might make you trip as you walk through a door, such as a raised step or threshold.  Trim any bushes or trees on the path to your home.  Use bright outdoor lighting.  Clear any walking paths of anything that might make someone trip, such as rocks or tools.  Regularly check to see if handrails are  loose or broken. Make sure that both sides of any steps have handrails.  Any raised decks and porches should have guardrails on the edges.  Have any leaves, snow, or ice cleared regularly.  Use sand or salt on walking paths during winter.  Clean up any spills in your garage right away. This includes oil or grease spills. What can I do in the bathroom?  Use night lights.  Install grab bars by the toilet and in the tub and shower. Do not use towel bars as grab bars.  Use non-skid mats or decals in the tub or shower.  If you need to sit down in the shower, use a plastic, non-slip stool.  Keep the floor dry. Clean up any water that spills on the floor as soon as it happens.  Remove soap buildup in the tub or shower regularly.  Attach bath mats securely with double-sided non-slip rug tape.  Do not have throw rugs and other things on the floor that can make you trip. What can I do in the bedroom?  Use night lights.  Make sure that you have a light by your bed that is easy to reach.  Do not use any sheets  or blankets that are too big for your bed. They should not hang down onto the floor.  Have a firm chair that has side arms. You can use this for support while you get dressed.  Do not have throw rugs and other things on the floor that can make you trip. What can I do in the kitchen?  Clean up any spills right away.  Avoid walking on wet floors.  Keep items that you use a lot in easy-to-reach places.  If you need to reach something above you, use a strong step stool that has a grab bar.  Keep electrical cords out of the way.  Do not use floor polish or wax that makes floors slippery. If you must use wax, use non-skid floor wax.  Do not have throw rugs and other things on the floor that can make you trip. What can I do with my stairs?  Do not leave any items on the stairs.  Make sure that there are handrails on both sides of the stairs and use them. Fix handrails that  are broken or loose. Make sure that handrails are as long as the stairways.  Check any carpeting to make sure that it is firmly attached to the stairs. Fix any carpet that is loose or worn.  Avoid having throw rugs at the top or bottom of the stairs. If you do have throw rugs, attach them to the floor with carpet tape.  Make sure that you have a light switch at the top of the stairs and the bottom of the stairs. If you do not have them, ask someone to add them for you. What else can I do to help prevent falls?  Wear shoes that:  Do not have high heels.  Have rubber bottoms.  Are comfortable and fit you well.  Are closed at the toe. Do not wear sandals.  If you use a stepladder:  Make sure that it is fully opened. Do not climb a closed stepladder.  Make sure that both sides of the stepladder are locked into place.  Ask someone to hold it for you, if possible.  Clearly mark and make sure that you can see:  Any grab bars or handrails.  First and last steps.  Where the edge of each step is.  Use tools that help you move around (mobility aids) if they are needed. These include:  Canes.  Walkers.  Scooters.  Crutches.  Turn on the lights when you go into a dark area. Replace any light bulbs as soon as they burn out.  Set up your furniture so you have a clear path. Avoid moving your furniture around.  If any of your floors are uneven, fix them.  If there are any pets around you, be aware of where they are.  Review your medicines with your doctor. Some medicines can make you feel dizzy. This can increase your chance of falling. Ask your doctor what other things that you can do to help prevent falls. This information is not intended to replace advice given to you by your health care provider. Make sure you discuss any questions you have with your health care provider. Document Released: 08/28/2009 Document Revised: 04/08/2016 Document Reviewed: 12/06/2014 Elsevier  Interactive Patient Education  2017 [employer].

## 2020-03-18 NOTE — Progress Notes (Signed)
Subjective:   Jonathan Shelton is a 69 y.o. male who presents for Medicare Annual/Subsequent preventive examination.  This visit is being conducted via phone call  - after an attmept to do on video chat - due to the COVID-19 pandemic. This patient has given me verbal consent via phone to conduct this visit, patient states they are participating from their home address. Some vital signs may be absent or patient reported.   Patient identification: identified by name, DOB, and current address.    Review of Systems:   Cardiac Risk Factors include: advanced age (>42men, >45 women);male gender;hypertension;dyslipidemia     Objective:    Vitals: Ht 5\' 10"  (1.778 m)   Wt 234 lb (106.1 kg)   BMI 33.58 kg/m   Body mass index is 33.58 kg/m.  Advanced Directives 02/06/2020 05/10/2019  Does Patient Have a Medical Advance Directive? Yes Yes  Type of Advance Directive Living will;Healthcare Power of Attorney -    Tobacco Social History   Tobacco Use  Smoking Status Former Smoker  . Types: Cigarettes  . Quit date: 7  . Years since quitting: 51.3  Smokeless Tobacco Former Engineer, structural given: Not Answered   Clinical Intake:  Pre-visit preparation completed: Yes  Pain : 0-10 Pain Score: 6  Pain Type: Chronic pain Pain Location: Leg Pain Orientation: Left, Right Pain Descriptors / Indicators: Aching Pain Onset: More than a month ago Pain Frequency: Constant Pain Relieving Factors: aleve  Pain Relieving Factors: aleve  Nutritional Status: BMI > 30  Obese Nutritional Risks: None Diabetes: No  How often do you need to have someone help you when you read instructions, pamphlets, or other written materials from your doctor or pharmacy?: 1 - Never  Interpreter Needed?: No  Information entered by :: Valli Randol,LPN  Past Medical History:  Diagnosis Date  . Allergy   . Colon polyps   . Diabetes ([place])   . Diverticulosis   . Gallstones   . Kidney stones   .  Renal disorder    Past Surgical History:  Procedure Laterality Date  . CARPAL TUNNEL RELEASE    . CHOLECYSTECTOMY    . COLONOSCOPY WITH PROPOFOL N/A 02/06/2020   Procedure: COLONOSCOPY WITH PROPOFOL;  Surgeon: Lin Landsman, MD;  Location: [facility] ENDOSCOPY;  Service: Gastroenterology;  Laterality: N/A;  . HERNIA REPAIR    . SMALL INTESTINE SURGERY     Family History  Problem Relation Age of Onset  . Heart disease Mother 82  . Heart disease Father   . Stroke Father 52  . Diabetes Father   . Heart attack Father   . Liver disease Neg Hx   . Colon cancer Neg Hx   . Esophageal cancer Neg Hx   . Pancreatic cancer Neg Hx   . Stomach cancer Neg Hx   . Inflammatory bowel disease Neg Hx   . Rectal cancer Neg Hx    Social History   Socioeconomic History  . Marital status: Married    Spouse name: Not on file  . Number of children: Not on file  . Years of education: Graduate Degree  . Highest education level: Master's degree (e.g., MA, MS, MEng, MEd, MSW, MBA)  Occupational History  . Not on file  Tobacco Use  . Smoking status: Former Smoker    Types: Cigarettes    Quit date: 1970    Years since quitting: 51.3  . Smokeless tobacco: Former Network engineer and Sexual Activity  .  Alcohol use: Not Currently  . Drug use: Yes    Comment: past  . Sexual activity: Not on file  Other Topics Concern  . Not on file  Social History Narrative  . Not on file   Social Determinants of Health   Financial Resource Strain:   . Difficulty of Paying Living Expenses:   Food Insecurity:   . Worried About Charity fundraiser in the Last Year:   . Arboriculturist in the Last Year:   Transportation Needs:   . Film/video editor (Medical):   Marland Kitchen Lack of Transportation (Non-Medical):   Physical Activity:   . Days of Exercise per Week:   . Minutes of Exercise per Session:   Stress:   . Feeling of Stress :   Social Connections:   . Frequency of Communication with Friends and Family:     . Frequency of Social Gatherings with Friends and Family:   . Attends Religious Services:   . Active Member of Clubs or Organizations:   . Attends Archivist Meetings:   Marland Kitchen Marital Status:     Outpatient Encounter Medications as of 03/18/2020  Medication Sig  . albuterol (VENTOLIN HFA) 108 (90 Base) MCG/ACT inhaler SMARTSIG:1 Puff(s) By Mouth Every 4 Hours PRN  . aspirin 81 MG chewable tablet Chew by mouth.  Marland Kitchen atorvastatin (LIPITOR) 10 MG tablet Take 1 tablet (10 mg total) by mouth daily at 6 PM.  . bifidobacterium infantis (ALIGN) capsule Take 1 capsule by mouth daily.  Marland Kitchen CRANBERRY FRUIT PO Take by mouth.  . finasteride (PROSCAR) 5 MG tablet Take 1 tablet (5 mg total) by mouth daily.  . insulin NPH Human (NOVOLIN N) 100 UNIT/ML injection Inject 0.4 mLs (40 Units total) into the skin at bedtime. May use reduced dose as advised.  Marland Kitchen JARDIANCE 25 MG TABS tablet TAKE 1 TABLET BY MOUTH EVERY DAY  . losartan (COZAAR) 50 MG tablet Take 50 mg by mouth daily.  . metFORMIN (GLUCOPHAGE) 1000 MG tablet Take 1 tablet (1,000 mg total) by mouth 2 (two) times daily with a meal.  . OZEMPIC, 1 MG/DOSE, 2 MG/1.5ML SOPN Inject 1 mg into the skin once a week.  . polyethylene glycol powder (GLYCOLAX/MIRALAX) 17 GM/SCOOP powder Take 17 g by mouth daily as needed for mild constipation or moderate constipation. Can increase to 2 doses a day or more if needed.  . psyllium (METAMUCIL) 58.6 % packet Take 1 packet by mouth daily.  . tamsulosin (FLOMAX) 0.4 MG CAPS capsule Take 1 capsule (0.4 mg total) by mouth daily after supper.  . vitamin E 400 UNIT capsule Take 400 Units by mouth daily.  . valsartan (DIOVAN) 80 MG tablet Take 1 tablet (80 mg total) by mouth daily. (Patient not taking: Reported on 03/18/2020)  . [DISCONTINUED] triamcinolone cream (KENALOG) 0.1 % APPLY TO AFFECTED AREA TWICE A DAY AS NEEDED   No facility-administered encounter medications on file as of 03/18/2020.    Activities of Daily  Living In your present state of health, do you have any difficulty performing the following activities: 03/18/2020 07/04/2019  Hearing? Y N  Comment no hearing aids -  Vision? N N  Comment eyeglasses, -  Difficulty concentrating or making decisions? N N  Walking or climbing stairs? Y N  Dressing or bathing? N N  Doing errands, shopping? N N  Preparing Food and eating ? N -  Using the Toilet? N -  In the past six months, have you accidently  leaked urine? N -  Do you have problems with loss of bowel control? N -  Managing your Medications? N -  Managing your Finances? N -  Housekeeping or managing your Housekeeping? N -  Some recent data might be hidden    Patient Care Team: [name], DO as PCP - General (Family Medicine)   Assessment:   This is a routine wellness examination for [place].  Exercise Activities and Dietary recommendations Current Exercise Habits: Home exercise routine, Type of exercise: walking, Time (Minutes): 20, Frequency (Times/Week): 3, Weekly Exercise (Minutes/Week): 60, Intensity: Mild, Exercise limited by: None identified  Goals Addressed   None     Fall Risk: Fall Risk  03/18/2020 02/11/2020 10/08/2019 07/04/2019 05/30/2019  Falls in the past year? 0 0 0 0 0  Number falls in past yr: 0 0 - 0 -  Injury with Fall? 0 0 - 0 -  Follow up - Falls evaluation completed Falls evaluation completed - Falls evaluation completed    [place]:  Any stairs in or around the home? Yes  If so, are there any without handrails? No   Home free of loose throw rugs in walkways, pet beds, electrical cords, etc? Yes  Adequate lighting in your home to reduce risk of falls? Yes   ASSISTIVE DEVICES UTILIZED TO PREVENT FALLS:  Life alert? No  Use of a cane, walker or w/c? No  Grab bars in the bathroom?yes  Shower chair or bench in shower? yes Elevated toilet seat or a handicapped toilet? No   TIMED UP AND GO:  Unable to perform    Depression Screen PHQ 2/9 Scores 03/18/2020 02/11/2020 10/08/2019 07/04/2019  PHQ - 2 Score 0 0 0 2  PHQ- 9 Score - - - 6    Cognitive Function        Immunization History  Administered Date(s) Administered  . Fluad Quad(high Dose 65+) 09/04/2019  . Influenza, High Dose Seasonal PF 09/08/2017    Qualifies for Shingles Vaccine? Yes  Zostavax completed n/a. Due for Shingrix. Education has been provided regarding the importance of this vaccine. Pt has been advised to call insurance company to determine out of pocket expense. Advised may also receive vaccine at local pharmacy or Health Dept. Verbalized acceptance and understanding.  Tdap: Discussed need for TD/TDAP vaccine, patient verbalized understanding that this is not covered as a preventative with there insurance and to call the office if he develops any new skin injuries, ie: cuts, scrapes, bug bites, or open wounds.   Flu Vaccine:  Up to date   Pneumococcal Vaccine: will think about getting it at next appt.   Covid-19 Vaccine: declined   Screening Tests Health Maintenance  Topic Date Due  . Hepatitis C Screening  Never done  . COVID-19 Vaccine (1) 04/03/2020 (Originally 11/03/1967)  . TETANUS/TDAP  07/03/2020 (Originally [birth year]1970)  . PNA vac Low Risk Adult (1 of 2 - PCV13) 07/03/2024 (Originally 11/02/2016)  . INFLUENZA VACCINE  06/15/2020  . FOOT EXAM  07/03/2020  . OPHTHALMOLOGY EXAM  07/09/2020  . HEMOGLOBIN A1C  08/13/2020  . Fecal DNA (Cologuard)  07/15/2022   Cancer Screenings:  Colorectal Screening: scheduled 03/2020  Lung Cancer Screening: (Low Dose CT Chest recommended if Age 32-80 years, 30 pack-year currently smoking OR have quit w/in 15years.) does not qualify.     Additional Screening:  Hepatitis C Screening: does qualify; will order at next visit   Vision Screening: Recommended annual ophthalmology exams for  early detection of glaucoma and other disorders of the eye. Is the patient up to date  with their annual eye exam?  No  Gave recommendations   Dental Screening: Recommended annual dental exams for proper oral hygiene  Community Resource Referral:  CRR required this visit?  No        Plan:  I have personally reviewed and addressed the Medicare Annual Wellness questionnaire and have noted the following in the patient's chart:  A. Medical and social history B. Use of alcohol, tobacco or illicit drugs  C. Current medications and supplements D. Functional ability and status E.  Nutritional status F.  Physical activity G. Advance directives H. List of other physicians I.  Hospitalizations, surgeries, and ER visits in previous 12 months J.  [place] such as hearing and vision if needed, cognitive and depression L. Referrals and appointments   In addition, I have reviewed and discussed with patient certain preventive protocols, quality metrics, and best practice recommendations. A written personalized care plan for preventive services as well as general preventive health recommendations were provided to patient.   Signed,   Bevelyn Ngo, LPN  [id number] Nurse Health Advisor   Nurse Notes: was on valsartan and was switched to losartan by pharmacy, was inquiring if he will continue on losartan from now on or needing to go back on valsartan.

## 2020-03-26 ENCOUNTER — Other Ambulatory Visit: Payer: Self-pay

## 2020-03-26 ENCOUNTER — Ambulatory Visit: Payer: Medicare HMO | Admitting: Dermatology

## 2020-03-26 DIAGNOSIS — Z85828 Personal history of other malignant neoplasm of skin: Secondary | ICD-10-CM

## 2020-03-26 DIAGNOSIS — L570 Actinic keratosis: Secondary | ICD-10-CM | POA: Diagnosis not present

## 2020-03-26 DIAGNOSIS — D229 Melanocytic nevi, unspecified: Secondary | ICD-10-CM

## 2020-03-26 DIAGNOSIS — L905 Scar conditions and fibrosis of skin: Secondary | ICD-10-CM

## 2020-03-26 DIAGNOSIS — S81811A Laceration without foreign body, right lower leg, initial encounter: Secondary | ICD-10-CM | POA: Diagnosis not present

## 2020-03-26 DIAGNOSIS — L814 Other melanin hyperpigmentation: Secondary | ICD-10-CM

## 2020-03-26 DIAGNOSIS — D1801 Hemangioma of skin and subcutaneous tissue: Secondary | ICD-10-CM | POA: Diagnosis not present

## 2020-03-26 DIAGNOSIS — Z1283 Encounter for screening for malignant neoplasm of skin: Secondary | ICD-10-CM | POA: Diagnosis not present

## 2020-03-26 DIAGNOSIS — L821 Other seborrheic keratosis: Secondary | ICD-10-CM

## 2020-03-26 DIAGNOSIS — L578 Other skin changes due to chronic exposure to nonionizing radiation: Secondary | ICD-10-CM

## 2020-03-26 DIAGNOSIS — I872 Venous insufficiency (chronic) (peripheral): Secondary | ICD-10-CM

## 2020-03-26 MED ORDER — MUPIROCIN 2 % EX OINT: 1.0000 "application " | TOPICAL_OINTMENT | Freq: Two times a day (BID) | CUTANEOUS | 0 refills | Status: DC

## 2020-03-26 NOTE — Progress Notes (Signed)
   New Patient Visit  Subjective  Jonathan Shelton is a 69 y.o. male who presents for the following: Annual Exam (History of skin cancer in [place]).  The patient presents for total-body skin exam for skin cancer screening and mole check.  The following portions of the chart were reviewed this encounter and updated as appropriate:  Tobacco  Allergies  Meds  Problems  Med Hx  Surg Hx  Fam Hx      Review of Systems:  No other skin or systemic complaints except as noted in HPI or Assessment and Plan.  Objective  Well appearing patient in no apparent distress; mood and affect are within normal limits.  A full examination was performed including scalp, head, eyes, ears, nose, lips, neck, chest, axillae, abdomen, back, buttocks, bilateral upper extremities, bilateral lower extremities, hands, feet, fingers, toes, fingernails, and toenails. All findings within normal limits unless otherwise noted below.  Objective  Right cheek x1, left cheek x 1 (2): Erythematous thin papules/macules with gritty scale.   Objective  Right Lower Leg - Anterior: Crust  Objective  Left Upper Back: Dyspigmented smooth macule or patch.   Objective  Bilateral lower legs: Erythematous, scaly patches involving the ankle and distal lower leg with associated lower leg edema.    Assessment & Plan    Lentigines - Scattered tan macules - Discussed due to sun exposure - Benign, observe - Call for any changes  Seborrheic Keratoses - Stuck-on, waxy, tan-brown papules and plaques  - Discussed benign etiology and prognosis. - Observe - Call for any changes  Melanocytic Nevi - Tan-brown and/or pink-flesh-colored symmetric macules and papules - Benign appearing on exam today - Observation - Call clinic for new or changing moles - Recommend daily use of broad spectrum spf 30+ sunscreen to sun-exposed areas.   Hemangiomas - Red papules - Discussed benign nature - Observe - Call for any  changes  Actinic Damage - diffuse scaly erythematous macules with underlying dyspigmentation - Recommend daily broad spectrum sunscreen SPF 30+ to sun-exposed areas, reapply every 2 hours as needed.  - Call for new or changing lesions.  Skin cancer screening performed today.   AK (actinic keratosis) (2) Right cheek x1, left cheek x 1  Destruction of lesion - Right cheek x1, left cheek x 1 Complexity: simple   Destruction method: cryotherapy   Informed consent: discussed and consent obtained   Timeout:  patient name, date of birth, surgical site, and procedure verified Lesion destroyed using liquid nitrogen: Yes   Region frozen until ice ball extended beyond lesion: Yes   Outcome: patient tolerated procedure well with no complications   Post-procedure details: wound care instructions given    Laceration of right lower leg, initial encounter Right Lower Leg - Anterior  mupirocin ointment (BACTROBAN) 2 % - Right Lower Leg - Anterior  Scar Left Upper Back  History of skin cancer treated a few years ago in [place].  Venous stasis dermatitis of right lower extremity Bilateral lower legs  Return in about 1 year (around 03/26/2021).  I, Ashok Cordia, CMA, am acting as scribe for Sarina Ser, MD .  Documentation: I have reviewed the above documentation for accuracy and completeness, and I agree with the above.  Sarina Ser, MD

## 2020-03-26 NOTE — Patient Instructions (Signed)

## 2020-03-27 ENCOUNTER — Other Ambulatory Visit (HOSPITAL_COMMUNITY): Payer: Medicare HMO

## 2020-03-27 ENCOUNTER — Other Ambulatory Visit
Admission: RE | Admit: 2020-03-27 | Discharge: 2020-03-27 | Disposition: A | Discharge status: Home / Self Care | Payer: Medicare HMO | Source: Ambulatory Visit | Attending: Gastroenterology | Admitting: Gastroenterology

## 2020-03-27 DIAGNOSIS — Z01812 Encounter for preprocedural laboratory examination: Secondary | ICD-10-CM | POA: Insufficient documentation

## 2020-03-27 DIAGNOSIS — Z20822 Contact with and (suspected) exposure to covid-19: Secondary | ICD-10-CM | POA: Insufficient documentation

## 2020-03-27 LAB — SARS CORONAVIRUS 2 (TAT 6-24 HRS): SARS Coronavirus 2: NEGATIVE

## 2020-03-28 ENCOUNTER — Other Ambulatory Visit: Payer: Self-pay

## 2020-03-28 ENCOUNTER — Encounter (HOSPITAL_COMMUNITY): Payer: Self-pay | Admitting: Gastroenterology

## 2020-03-28 NOTE — Progress Notes (Signed)
Attempt pre procedure call

## 2020-03-28 NOTE — Progress Notes (Signed)
Pt denies SOB, chest pain, and being under the care of a cardiologist. Pt stated that PCP is Dr. Parks Ranger. Pt denies having a cardiac cath and echo but stated that a stress test was performed > 10 years ago. Pt denies having an EKG and chest x ray in the last year. Pt denies recent labs. Pt stated that he stopped taking Aspirin. Pt made aware to stop taking Melatonin, Cranberry fruit, vitamins, fish oil and herbal medications. Do not take any NSAIDs ie: Ibuprofen, Advil, Naproxen (Aleve), Motrin, BC and Goody Powder. Pt made aware to hold Jardiance the day before surgery, take 50% of Novolin N insulin the night before surgery and hold all diabetes medications DOS ( Metformin, Ozempic, and Jardiance). Pt made aware to check CBG every 2 hours prior to arrival to hospital on DOS. Pt made aware to treat a CBG < 70 with 4  ounces of apple or cranberry juice, wait 15 minutes after intervention to recheck CBG, if CBG remains < 70, call the Endoscopy unit to speak with a nurse. Pt reminded to quarantine. Pt verbalized understanding of all pre-op instructions.

## 2020-03-30 NOTE — Anesthesia Preprocedure Evaluation (Addendum)
Anesthesia Evaluation  Patient identified by MRN, date of birth, ID band Patient awake    Reviewed: Allergy & Precautions, H&P , NPO status , Patient's Chart, lab work & pertinent test results  Airway Mallampati: III  TM Distance: >3 FB Neck ROM: Full    Dental no notable dental hx. (+) Teeth Intact, Dental Advisory Given   Pulmonary neg pulmonary ROS, former smoker,    Pulmonary exam normal breath sounds clear to auscultation       Cardiovascular Exercise Tolerance: Good negative cardio ROS   Rhythm:Regular Rate:Normal     Neuro/Psych negative neurological ROS  negative psych ROS   GI/Hepatic Neg liver ROS, GERD  ,  Endo/Other  diabetes, Insulin Dependent, Oral Hypoglycemic Agents  Renal/GU Renal disease  negative genitourinary   Musculoskeletal   Abdominal   Peds  Hematology negative hematology ROS (+)   Anesthesia Other Findings   Reproductive/Obstetrics negative OB ROS                            Anesthesia Physical Anesthesia Plan  ASA: III  Anesthesia Plan: MAC   Post-op Pain Management:    Induction: Intravenous  PONV Risk Score and Plan: 1 and Propofol infusion  Airway Management Planned: Simple Face Mask  Additional Equipment:   Intra-op Plan:   Post-operative Plan:   Informed Consent: I have reviewed the patients History and Physical, chart, labs and discussed the procedure including the risks, benefits and alternatives for the proposed anesthesia with the patient or authorized representative who has indicated his/her understanding and acceptance.     Dental advisory given  Plan Discussed with: CRNA  Anesthesia Plan Comments:         Anesthesia Quick Evaluation

## 2020-03-31 ENCOUNTER — Ambulatory Visit (HOSPITAL_COMMUNITY): Payer: Medicare HMO | Admitting: Anesthesiology

## 2020-03-31 ENCOUNTER — Ambulatory Visit (HOSPITAL_COMMUNITY)
Admission: RE | Admit: 2020-03-31 | Discharge: 2020-03-31 | Disposition: A | Discharge status: Home / Self Care | Payer: Medicare HMO | Source: Ambulatory Visit | Attending: Gastroenterology | Admitting: Gastroenterology

## 2020-03-31 ENCOUNTER — Encounter (HOSPITAL_COMMUNITY): Payer: Self-pay | Admitting: Gastroenterology

## 2020-03-31 ENCOUNTER — Other Ambulatory Visit: Payer: Self-pay

## 2020-03-31 ENCOUNTER — Encounter (HOSPITAL_COMMUNITY)
Admission: RE | Disposition: A | Discharge status: Home / Self Care | Payer: Self-pay | Source: Ambulatory Visit | Attending: Gastroenterology

## 2020-03-31 DIAGNOSIS — Z87891 Personal history of nicotine dependence: Secondary | ICD-10-CM | POA: Insufficient documentation

## 2020-03-31 DIAGNOSIS — K635 Polyp of colon: Secondary | ICD-10-CM | POA: Diagnosis not present

## 2020-03-31 DIAGNOSIS — D122 Benign neoplasm of ascending colon: Secondary | ICD-10-CM

## 2020-03-31 DIAGNOSIS — Z86010 Personal history of colonic polyps: Secondary | ICD-10-CM | POA: Diagnosis not present

## 2020-03-31 DIAGNOSIS — K641 Second degree hemorrhoids: Secondary | ICD-10-CM | POA: Insufficient documentation

## 2020-03-31 DIAGNOSIS — Q438 Other specified congenital malformations of intestine: Secondary | ICD-10-CM | POA: Diagnosis not present

## 2020-03-31 DIAGNOSIS — K5730 Diverticulosis of large intestine without perforation or abscess without bleeding: Secondary | ICD-10-CM | POA: Insufficient documentation

## 2020-03-31 DIAGNOSIS — D120 Benign neoplasm of cecum: Secondary | ICD-10-CM | POA: Diagnosis not present

## 2020-03-31 DIAGNOSIS — E119 Type 2 diabetes mellitus without complications: Secondary | ICD-10-CM | POA: Insufficient documentation

## 2020-03-31 DIAGNOSIS — H9190 Unspecified hearing loss, unspecified ear: Secondary | ICD-10-CM | POA: Diagnosis not present

## 2020-03-31 DIAGNOSIS — K644 Residual hemorrhoidal skin tags: Secondary | ICD-10-CM | POA: Diagnosis not present

## 2020-03-31 DIAGNOSIS — Z794 Long term (current) use of insulin: Secondary | ICD-10-CM | POA: Insufficient documentation

## 2020-03-31 HISTORY — PX: ENDOSCOPIC MUCOSAL RESECTION: SHX6839

## 2020-03-31 HISTORY — PX: HEMOSTASIS CLIP PLACEMENT: SHX6857

## 2020-03-31 HISTORY — PX: COLONOSCOPY WITH PROPOFOL: SHX5780

## 2020-03-31 HISTORY — DX: Presence of spectacles and contact lenses: Z97.3

## 2020-03-31 HISTORY — DX: Unspecified hearing loss, unspecified ear: H91.90

## 2020-03-31 HISTORY — PX: POLYPECTOMY: SHX5525

## 2020-03-31 HISTORY — PX: SUBMUCOSAL LIFTING INJECTION: SHX6855

## 2020-03-31 HISTORY — DX: Gastro-esophageal reflux disease without esophagitis: K21.9

## 2020-03-31 HISTORY — DX: Benign prostatic hyperplasia without lower urinary tract symptoms: N40.0

## 2020-03-31 LAB — GLUCOSE, CAPILLARY
Glucose-Capillary: 97 mg/dL (ref 70–99)
Glucose-Capillary: 116 mg/dL — ABNORMAL HIGH (ref 70–99)

## 2020-03-31 SURGERY — COLONOSCOPY WITH PROPOFOL
Anesthesia: Monitor Anesthesia Care

## 2020-03-31 MED ORDER — PROPOFOL 10 MG/ML IV BOLUS
INTRAVENOUS | Status: DC | PRN
  Administered 2020-03-31: 10 mg via INTRAVENOUS
  Administered 2020-03-31: 15 mg via INTRAVENOUS

## 2020-03-31 MED ORDER — LIDOCAINE HCL (CARDIAC) PF 100 MG/5ML IV SOSY
PREFILLED_SYRINGE | INTRAVENOUS | Status: DC | PRN
  Administered 2020-03-31: 50 mg via INTRAVENOUS

## 2020-03-31 MED ORDER — LACTATED RINGERS IV SOLN
INTRAVENOUS | Status: DC | PRN
Start: 2020-03-31 — End: 2020-03-31

## 2020-03-31 MED ORDER — SODIUM CHLORIDE 0.9 % IV SOLN: INTRAVENOUS | Status: DC

## 2020-03-31 MED ORDER — PROPOFOL 500 MG/50ML IV EMUL
INTRAVENOUS | Status: DC | PRN
  Administered 2020-03-31: 50 ug/kg/min via INTRAVENOUS

## 2020-03-31 SURGICAL SUPPLY — 21 items

## 2020-03-31 NOTE — Anesthesia Postprocedure Evaluation (Signed)
Anesthesia Post Note  Patient: Jonathan Shelton  Procedure(s) Performed: COLONOSCOPY WITH PROPOFOL (N/A ) ENDOSCOPIC MUCOSAL RESECTION (N/A ) SUBMUCOSAL LIFTING INJECTION HEMOSTASIS CLIP PLACEMENT POLYPECTOMY     Patient location during evaluation: Endoscopy Anesthesia Type: MAC Level of consciousness: awake and alert Pain management: pain level controlled Vital Signs Assessment: post-procedure vital signs reviewed and stable Respiratory status: spontaneous breathing, nonlabored ventilation and respiratory function stable Cardiovascular status: stable and blood pressure returned to baseline Postop Assessment: no apparent nausea or vomiting Anesthetic complications: no    Last Vitals:  Vitals:   03/31/20 0925 03/31/20 0930  BP: 136/84   Pulse: 74   Resp: 16   Temp:  36.7 C  SpO2: 96%     Last Pain:  Vitals:   03/31/20 0925  TempSrc:   PainSc: 0-No pain                 Dawn Kiper,W. EDMOND

## 2020-03-31 NOTE — Anesthesia Procedure Notes (Signed)
Procedure Name: MAC Date/Time: 03/31/2020 7:30 AM Performed by: Mariea Clonts, CRNA Pre-anesthesia Checklist: Patient identified, Emergency Drugs available, Suction available, Patient being monitored and Timeout performed Patient Re-evaluated:Patient Re-evaluated prior to induction Oxygen Delivery Method: Simple face mask and Nasal cannula

## 2020-03-31 NOTE — Discharge Instructions (Signed)
No aspirin for 1-week. No NSAIDs for 2-weeks.

## 2020-03-31 NOTE — Transfer of Care (Signed)
Immediate Anesthesia Transfer of Care Note  Patient: Jonathan Shelton  Procedure(s) Performed: COLONOSCOPY WITH PROPOFOL (N/A ) ENDOSCOPIC MUCOSAL RESECTION (N/A ) SUBMUCOSAL LIFTING INJECTION HEMOSTASIS CLIP PLACEMENT POLYPECTOMY  Patient Location: PACU  Anesthesia Type:MAC  Level of Consciousness: awake, alert , oriented, patient cooperative and responds to stimulation  Airway & Oxygen Therapy: Patient Spontanous Breathing and Patient connected to face mask oxygen  Post-op Assessment: Report given to RN and Post -op Vital signs reviewed and stable  Post vital signs: Reviewed and stable  Last Vitals:  Vitals Value Taken Time  BP 130/87 03/31/20 0910  Temp    Pulse 75 03/31/20 0911  Resp 16 03/31/20 0911  SpO2 98 % 03/31/20 0911  Vitals shown include unvalidated device data.  Last Pain:  Vitals:   03/31/20 0701  TempSrc: Oral  PainSc: 0-No pain         Complications: No apparent anesthesia complications

## 2020-03-31 NOTE — Op Note (Signed)
[facility] Patient Name: Jonathan Shelton Procedure Date : 03/31/2020 MRN: [id number] Attending MD: Justice Britain , MD Date of Birth: [birth year] CSN: [id number] Age: 69 Admit Type: Inpatient Procedure:                Colonoscopy Indications:              Excision of colonic polyp Providers:                Justice Britain, MD, Burtis Junes, RN, Lazaro Arms,                            Technician, Virgilio Belling. Huel Cote, CRNA Referring MD:             Lin Landsman MD, MD, [name] Medicines:                Monitored Anesthesia Care Complications:            No immediate complications. Estimated Blood Loss:     Estimated blood loss was minimal. Procedure:                Pre-Anesthesia Assessment:                           - Prior to the procedure, a History and Physical                            was performed, and patient medications and                            allergies were reviewed. The patient's tolerance of                            previous anesthesia was also reviewed. The risks                            and benefits of the procedure and the sedation                            options and risks were discussed with the patient.                            All questions were answered, and informed consent                            was obtained. Prior Anticoagulants: The patient has                            taken no previous anticoagulant or antiplatelet                            agents except for aspirin. ASA Grade Assessment: II                            - A patient with mild systemic disease. After  reviewing the risks and benefits, the patient was                            deemed in satisfactory condition to undergo the                            procedure.                           After obtaining informed consent, the colonoscope                            was passed under direct vision. Throughout the                           procedure, the patient's blood pressure, pulse, and                            oxygen saturations were monitored continuously. The                            CF-HQ190L ([id number]) Olympus colonoscope was                            introduced through the anus and advanced to the the                            cecum, identified by appendiceal orifice and                            ileocecal valve. The colonoscopy was technically                            difficult and complex due to a redundant colon and                            significant looping. Successful completion of the                            procedure was aided by changing the patient's                            position, using manual pressure, withdrawing and                            reinserting the scope, straightening and shortening                            the scope to obtain bowel loop reduction and using                            scope torsion. The patient tolerated the procedure.  The quality of the bowel preparation was adequate.                            The ileocecal valve, appendiceal orifice, and                            rectum were photographed. Scope In: 7:41:50 AM Scope Out: 9:02:22 AM Scope Withdrawal Time: 1 hour 13 minutes 14 seconds  Total Procedure Duration: 1 hour 20 minutes 32 seconds  Findings:      The digital rectal exam findings include hemorrhoids. Pertinent       negatives include no palpable rectal lesions.      A medium post polypectomy scar was found in the cecum. There was       residual polyp tissue.      A 30 mm polyp was found in the cecum. The polyp was semi-sessile.       Preparations were made for mucosal resection. Orise gel was injected to       raise the lesion. The lesion partially lifted, but that region adjacent       to the scar site did not have significant lift. Piecemeal mucosal       resection using a snare was  performed. I performed avulsion as well to       the region of the scarred tissue to enhance an attempt at complete       resection. Resection and retrieval were complete. To close the defect       after mucosal resection, nine hemostatic clips were successfully placed       (MR conditional), the size of the resection was such that complete       closure was not possible, but majority of lesion was able to be closed.       There was no bleeding at the end of the procedure.      A 4 mm polyp was found in the ascending colon. The polyp was sessile.       The polyp was removed with a cold snare. Resection and retrieval were       complete.      Multiple small and large-mouthed diverticula were found in the sigmoid       colon and descending colon.      The rest of the colon was grossly normal on visualization - though       detailed examination was not performed due to recent full colonoscopy.      Non-bleeding non-thrombosed external and internal hemorrhoids were found       during retroflexion, during perianal exam and during digital exam. The       hemorrhoids were Grade II (internal hemorrhoids that prolapse but reduce       spontaneously). Impression:               - Hemorrhoids found on digital rectal exam.                           - Post-polypectomy scar in the cecum with findings                            of persistent adenoma present.                           -  One 30 mm polyp in the cecum, removed with                            piecemeal mucosal resection and cold avulsion.                            Resected and retrieved. Clips (MR conditional) were                            placed.                           - One 4 mm polyp in the ascending colon, removed                            with a cold snare. Resected and retrieved.                           - Diverticulosis in the sigmoid colon and in the                            descending colon.                           -  Non-bleeding non-thrombosed external and internal                            hemorrhoids. Recommendation:           - The patient will be observed post-procedure,                            until all discharge criteria are met.                           - Discharge patient to home.                           - Patient has a contact number available for                            emergencies. The signs and symptoms of potential                            delayed complications were discussed with the                            patient. Return to normal activities tomorrow.                            Written discharge instructions were provided to the                            patient.                           -  High fiber diet.                           - Continue present medications.                           - Hold on Aspirin restart for 1-week.                           - No NSAID use for at least 2-weeks.                           - Await pathology results.                           - Repeat colonoscopy in 6 months for surveillance                            of resection site and Endorotor v FTRD vs Hot                            Avulsion likely to be pursued.                           - The findings and recommendations were discussed                            with the patient.                           - The findings and recommendations were discussed                            with the patient's family. Procedure Code(s):        --- Professional ---                           [phone], Colonoscopy, flexible; with endoscopic                            mucosal resection                           [id number], 69, Colonoscopy, flexible; with removal of                            tumor(s), polyp(s), or other lesion(s) by snare                            technique Diagnosis Code(s):        --- Professional ---                           K64.1, Second degree hemorrhoids                            Z98.890, Other specified postprocedural states  K63.5, Polyp of colon                           K57.30, Diverticulosis of large intestine without                            perforation or abscess without bleeding CPT copyright 2019 American Medical Association. All rights reserved. The codes documented in this report are preliminary and upon coder review may  be revised to meet current compliance requirements. Justice Britain, MD 03/31/2020 9:19:24 AM Number of Addenda: 0

## 2020-03-31 NOTE — H&P (Signed)
GASTROENTEROLOGY PROCEDURE H&P NOTE   Primary Care Physician: [name], DO  HPI: Jonathan Shelton is a 69 y.o. 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  Past Medical History:  Diagnosis Date  . Allergy   . BPH (benign prostatic hyperplasia)   . Colon polyps   . Diabetes ([place])   . Diverticulosis   . Gallstones   . GERD (gastroesophageal reflux disease)   . HOH (hard of hearing)   . Kidney stones   . Renal disorder   . Wears glasses    Past Surgical History:  Procedure Laterality Date  . CARPAL TUNNEL RELEASE    . CHOLECYSTECTOMY    . COLONOSCOPY WITH PROPOFOL N/A 02/06/2020   Procedure: COLONOSCOPY WITH PROPOFOL;  Surgeon: Lin Landsman, MD;  Location: [facility] ENDOSCOPY;  Service: Gastroenterology;  Laterality: N/A;  . HERNIA REPAIR    . SMALL INTESTINE SURGERY     Current Facility-Administered Medications  Medication Dose Route Frequency Provider Last Rate Last Admin  . 0.9 %  sodium chloride infusion   Intravenous Continuous Mansouraty, Telford Nab., MD       Allergies  Allergen Reactions  . Testosterone Rash    patch   Family History  Problem Relation Age of Onset  . Heart disease Mother 31  . Heart disease Father   . Stroke Father 22  . Diabetes Father   . Heart attack Father   . Liver disease Neg Hx   . Colon cancer Neg Hx   . Esophageal cancer Neg Hx   . Pancreatic cancer Neg Hx   . Stomach cancer Neg Hx   . Inflammatory bowel disease Neg Hx   . Rectal cancer Neg Hx    Social History   Socioeconomic History  . Marital status: Married    Spouse name: Not on file  . Number of children: Not on file  . Years of education: Graduate Degree  . Highest education level: Master's degree (e.g., MA, MS, MEng, MEd, MSW, MBA)  Occupational History  . Not on file  Tobacco Use  . Smoking status: Former Smoker    Types: Cigarettes    Quit date: 1970    Years since quitting: 51.4    . Smokeless tobacco: Former Systems developer    Types: Snuff  Substance and Sexual Activity  . Alcohol use: Not Currently  . Drug use: Not Currently    Comment: past  . Sexual activity: Not on file  Other Topics Concern  . Not on file  Social History Narrative  . Not on file   Social Determinants of Health   Financial Resource Strain:   . Difficulty of Paying Living Expenses:   Food Insecurity:   . Worried About Charity fundraiser in the Last Year:   . Arboriculturist in the Last Year:   Transportation Needs:   . Film/video editor (Medical):   Marland Kitchen Lack of Transportation (Non-Medical):   Physical Activity:   . Days of Exercise per Week:   . Minutes of Exercise per Session:   Stress:   . Feeling of Stress :   Social Connections:   . Frequency of Communication with Friends and Family:   . Frequency of Social Gatherings with Friends and Family:   . Attends Religious Services:   . Active Member of Clubs or Organizations:   . Attends Archivist Meetings:   Marland Kitchen Marital Status:   Intimate  Partner Violence:   . Fear of Current or Ex-Partner:   . Emotionally Abused:   Marland Kitchen Physically Abused:   . Sexually Abused:     Physical Exam: Vital signs in last 24 hours: Temp:  [99 F (37.2 C)] 99 F (37.2 C) (05/17 0701) Pulse Rate:  [83] 83 (05/17 0701) Resp:  [19] 19 (05/17 0701) BP: (155)/(84) 155/84 (05/17 0701) SpO2:  [96 %] 96 % (05/17 0701) Weight:  [105.2 kg] 105.2 kg (05/17 0701)   GEN: NAD EYE: Sclerae anicteric ENT: MMM CV: Non-tachycardic GI: Soft, NT/ND NEURO:  Alert & Oriented x 3  Lab Results: No results for input(s): WBC, HGB, HCT, PLT in the last 72 hours. BMET No results for input(s): NA, K, CL, CO2, GLUCOSE, BUN, CREATININE, CALCIUM in the last 72 hours. LFT No results for input(s): PROT, ALBUMIN, AST, ALT, ALKPHOS, BILITOT, BILIDIR, IBILI in the last 72 hours. PT/INR No results for input(s): LABPROT, INR in the last 72 hours.   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  The risks and benefits of endoscopic evaluation were discussed with the patient; these include but are not limited to the risk of perforation, infection, bleeding, missed lesions, lack of diagnosis, severe illness requiring hospitalization, as well as anesthesia and sedation related illnesses.  The patient is agreeable to proceed.    Justice Britain, MD [facility] # [id number]

## 2020-04-01 DIAGNOSIS — I10 Essential (primary) hypertension: Secondary | ICD-10-CM

## 2020-04-01 LAB — SURGICAL PATHOLOGY

## 2020-04-01 MED ORDER — VALSARTAN 80 MG PO TABS: 80.0000 mg | ORAL_TABLET | Freq: Every day | ORAL | 3 refills | Status: DC

## 2020-04-03 ENCOUNTER — Encounter: Payer: Self-pay | Admitting: Dermatology

## 2020-04-03 LAB — HM DIABETES EYE EXAM

## 2020-04-06 ENCOUNTER — Encounter: Payer: Self-pay | Admitting: Gastroenterology

## 2020-04-08 ENCOUNTER — Other Ambulatory Visit: Payer: Self-pay | Admitting: Family Medicine

## 2020-04-08 DIAGNOSIS — E1169 Type 2 diabetes mellitus with other specified complication: Secondary | ICD-10-CM

## 2020-04-08 NOTE — Telephone Encounter (Signed)
Requested Prescriptions  Pending Prescriptions Disp Refills  . JARDIANCE 25 MG TABS tablet [Pharmacy Med Name: JARDIANCE 25 MG TABLET] 90 tablet 1    Sig: TAKE 1 TABLET BY MOUTH EVERY DAY     Endocrinology:  Diabetes - SGLT2 Inhibitors Failed - 04/08/2020  3:20 PM      Failed - LDL in normal range and within 360 days    No results found for: LDLCALC, LDLC, HIRISKLDL, POCLDL, LDLDIRECT, REALLDLC, TOTLDLC       Passed - Cr in normal range and within 360 days    Creat  Date Value Ref Range Status  07/03/2019 0.85 0.70 - 1.25 mg/dL Final    Comment:    For patients >75 years of age, the reference limit for Creatinine is approximately 13% higher for people identified as African-American. .          Passed - HBA1C is between 0 and 7.9 and within 180 days    Hemoglobin A1C  Date Value Ref Range Status  02/11/2020 6.8 (A) 4.0 - 5.6 % Final   Hgb A1c MFr Bld  Date Value Ref Range Status  07/03/2019 8.1 (H) <5.7 % of total Hgb Final    Comment:    For someone without known diabetes, a hemoglobin A1c value of 6.5% or greater indicates that they may have  diabetes and this should be confirmed with a follow-up  test. . For someone with known diabetes, a value <7% indicates  that their diabetes is well controlled and a value  greater than or equal to 7% indicates suboptimal  control. A1c targets should be individualized based on  duration of diabetes, age, comorbid conditions, and  other considerations. . Currently, no consensus exists regarding use of hemoglobin A1c for diagnosis of diabetes for children. .          Passed - eGFR in normal range and within 360 days    GFR, Est African American  Date Value Ref Range Status  07/03/2019 104 > OR = 60 mL/min/1.62m Final   GFR, Est Non African American  Date Value Ref Range Status  07/03/2019 90 > OR = 60 mL/min/1.74mFinal         Passed - Valid encounter within last 6 months    Recent Outpatient Visits          1 month  ago Type 2 diabetes mellitus with other specified complication, [facility]  [place]   3 [place]al [place]   [place]   6 months ago Type 2 diabetes mellitus with other specified complication, [facility]   9 months ago [facility]   [facility]   10 months ago Type 2 diabetes mellitus with other specified complication, with long-[facility]  [facility]      Future Appointments            In 11 months Ko[place]alene BatheMD [place]

## 2020-04-08 NOTE — Telephone Encounter (Signed)
Called pharmacy and med was refilled for #90 and was picked up in April. Per pharmacist he was instructed to look around house to see if he can find it. Called pt and pt stated that he did not pick up the prescription in April. Informed med was ordered in January and reordered today by NT.  Informed pt I electronically refilled the Jardiance today. Pt upset and frustrated stating, z'everytime I need a refill from this pharmacy, it is always a hassle." Pt stated he will call pharmacy.

## 2020-04-08 NOTE — Addendum Note (Signed)
Addended by: Carlisle Beers on: 04/08/2020 04:10 PM   Modules accepted: Orders

## 2020-04-08 NOTE — Telephone Encounter (Signed)
JARDIANCE 25 MG TABS tablet Medication Date: 04/08/2020 Department: [facility] Ordering/Authorizing: [name], DO   Pt had pharmacy call on his behalf   for meds, pharmacist states pt was told he would get scripts for all meds and pharmacist says they have not received any. Pt is completely out of his Vania Rea and was to go out of town tomorrow. He is out of this specific med and looks as if sent but Pharmacy states no. Def needs this one but per encounter were to be refilled? Pharmacy wants verification   [id number] - [place], [place] Phone:  [phone]  Fax:  [phone]

## 2020-05-02 ENCOUNTER — Other Ambulatory Visit (INDEPENDENT_AMBULATORY_CARE_PROVIDER_SITE_OTHER): Payer: Medicare HMO | Admitting: Vascular Surgery

## 2020-05-05 ENCOUNTER — Encounter (INDEPENDENT_AMBULATORY_CARE_PROVIDER_SITE_OTHER): Payer: Medicare HMO

## 2020-05-20 ENCOUNTER — Other Ambulatory Visit (INDEPENDENT_AMBULATORY_CARE_PROVIDER_SITE_OTHER): Payer: Medicare HMO | Admitting: Vascular Surgery

## 2020-05-23 ENCOUNTER — Encounter (INDEPENDENT_AMBULATORY_CARE_PROVIDER_SITE_OTHER): Payer: Medicare HMO

## 2020-05-24 ENCOUNTER — Other Ambulatory Visit: Payer: Self-pay | Admitting: Family Medicine

## 2020-05-24 DIAGNOSIS — N401 Enlarged prostate with lower urinary tract symptoms: Secondary | ICD-10-CM

## 2020-05-24 DIAGNOSIS — I10 Essential (primary) hypertension: Secondary | ICD-10-CM

## 2020-05-24 DIAGNOSIS — R3914 Feeling of incomplete bladder emptying: Secondary | ICD-10-CM

## 2020-05-24 NOTE — Telephone Encounter (Signed)
Requested Prescriptions  Pending Prescriptions Disp Refills  . finasteride (PROSCAR) 5 MG tablet [Pharmacy Med Name: FINASTERIDE 5 MG TABLET] 90 tablet 1    Sig: TAKE 1 TABLET BY MOUTH EVERY DAY     Urology: 5-alpha Reductase Inhibitors Passed - 05/24/2020  8:32 AM      Passed - Valid encounter within last 12 months    Recent Outpatient Visits          3 months ago Type 2 diabetes mellitus with other specified complication, with long-term current use of insulin ([place])   [employer], DO   4 months ago Rectal bleeding   [place], DO   7 months ago Type 2 diabetes mellitus with other specified complication, with long-term current use of insulin [facility])   [facility], Devonne Doughty, DO   10 months ago Annual physical exam   [facility] [name], DO   12 months ago Type 2 diabetes mellitus with other specified complication, with long-term current use of insulin ([place])   [facility], Devonne Doughty, DO      Future Appointments            In 1 month [facility], Devonne Doughty, DO [facility] [place]   In 10 months Ralene Bathe, MD [place]

## 2020-06-09 ENCOUNTER — Telehealth: Payer: Self-pay

## 2020-06-09 DIAGNOSIS — Z1159 Encounter for screening for other viral diseases: Secondary | ICD-10-CM

## 2020-06-09 DIAGNOSIS — Z0000 Encounter for general adult medical examination without abnormal findings: Secondary | ICD-10-CM

## 2020-06-09 DIAGNOSIS — E785 Hyperlipidemia, unspecified: Secondary | ICD-10-CM

## 2020-06-09 DIAGNOSIS — I10 Essential (primary) hypertension: Secondary | ICD-10-CM

## 2020-06-09 DIAGNOSIS — E1169 Type 2 diabetes mellitus with other specified complication: Secondary | ICD-10-CM

## 2020-06-09 DIAGNOSIS — N401 Enlarged prostate with lower urinary tract symptoms: Secondary | ICD-10-CM

## 2020-06-09 NOTE — Telephone Encounter (Signed)
Unable to reach the patient to find out what question does he have will follow up tomorrow, also left detail message to either give detail to [facility] or transfer to [facility] tomorrow.

## 2020-06-09 NOTE — Telephone Encounter (Signed)
Alright.  FYI I checked his future orders.  He is scheduled to see me on 07/15/20 at 2pm for Annual Physical.  He has a full lab panel ordered by me for [facility], not [facility].  It includes, CMET, CBC, Lipid, A1c, PSA, Hep C, TSH  And Urinalysis ([facility])  He will need these [facility] orders released around the time when he goes to the lab can be done 1-2 weeks before, so they are ready to draw his blood at [place] he has a few orders in that are FUTURE orders from another Dr Rush Landmark, would to be careful to not release those orders.  Thanks  Nobie Putnam, [facility] 06/09/2020, 5:36 PM

## 2020-06-09 NOTE — Telephone Encounter (Signed)
Copied from [place] [id number]. Topic: General - Other >> Jun 09, 2020  1:29 PM Hinda Lenis D wrote: PT has questions about couple test been order by Dr Raliegh Ip. / please advise

## 2020-06-10 NOTE — Telephone Encounter (Signed)
Ok I have released all lab orders for [facility].  Will put them in my box to be mailed to patient.  Nobie Putnam, [facility] 06/10/2020, 12:12 PM

## 2020-06-10 NOTE — Telephone Encounter (Signed)
Spoke to the pt and explain him what all these physical labs mean and he also wants lab order to be mailed 2 weeks prior to his physical appointment.

## 2020-06-13 ENCOUNTER — Ambulatory Visit (INDEPENDENT_AMBULATORY_CARE_PROVIDER_SITE_OTHER): Payer: Medicare HMO | Admitting: Vascular Surgery

## 2020-06-13 ENCOUNTER — Other Ambulatory Visit: Payer: Self-pay

## 2020-06-13 VITALS — BP 127/74 | HR 91 | Resp 18 | Ht 70.0 in | Wt 238.0 lb

## 2020-06-13 DIAGNOSIS — I83813 Varicose veins of bilateral lower extremities with pain: Secondary | ICD-10-CM | POA: Diagnosis not present

## 2020-06-13 NOTE — Progress Notes (Signed)
Jonathan Shelton is a 69 y.o. male who presents with symptomatic venous reflux  Past Medical History:  Diagnosis Date  . Allergy   . BPH (benign prostatic hyperplasia)   . Colon polyps   . Diabetes ([place])   . Diverticulosis   . Gallstones   . GERD (gastroesophageal reflux disease)   . HOH (hard of hearing)   . Kidney stones   . Renal disorder   . Wears glasses     Past Surgical History:  Procedure Laterality Date  . CARPAL TUNNEL RELEASE    . CHOLECYSTECTOMY    . COLONOSCOPY WITH PROPOFOL N/A 02/06/2020   Procedure: COLONOSCOPY WITH PROPOFOL;  Surgeon: Lin Landsman, MD;  Location: [facility] END[facility]COPY;  Service: Gastroenterology;  Laterality: N/A;  . COLONOSCOPY WITH PROPOFOL N/A 03/31/2020   Procedure: COLONOSCOPY WITH PROPOFOL;  Surgeon: Rush Landmark Telford Nab., MD;  Location: [place];  Service: Gastroenterology;  Laterality: N/A;  . ENDOSCOPIC MUCOSAL RESECTION N/A 03/31/2020   Procedure: ENDOSCOPIC MUCOSAL RESECTION;  Surgeon: Rush Landmark Telford Nab., MD;  Location: [place];  Service: Gastroenterology;  Laterality: N/A;  . HEMOSTASIS CLIP PLACEMENT  03/31/2020   Procedure: HEMOSTASIS CLIP PLACEMENT;  Surgeon: Irving Copas., MD;  Location: [place];  Service: Gastroenterology;;  . HERNIA REPAIR    . POLYPECTOMY  03/31/2020   Procedure: POLYPECTOMY;  Surgeon: Mansouraty, Telford Nab., MD;  Location: [place];  Service: Gastroenterology;;  . SMALL INTESTINE SURGERY    . SUBMUCOSAL LIFTING INJECTION  03/31/2020   Procedure: SUBMUCOSAL LIFTING INJECTION;  Surgeon: Irving Copas., MD;  Location: [place];  Service: Gastroenterology;;     Current Outpatient Medications:  .  albuterol (VENTOLIN HFA) 108 (90 Base) MCG/ACT inhaler, Inhale 1 puff into the lungs every 4 (four) hours as needed for wheezing or shortness of breath. , Disp: , Rfl:  .  ascorbic acid (VITAMIN C) 500 MG tablet, Take 500 mg by mouth daily., Disp: , Rfl:  .  aspirin 81 MG  chewable tablet, Chew 81 mg by mouth daily. , Disp: , Rfl:  .  atorvastatin (LIPITOR) 10 MG tablet, Take 1 tablet (10 mg total) by mouth daily at 6 PM., Disp: 90 tablet, Rfl: 1 .  b complex vitamins tablet, Take 1 tablet by mouth daily., Disp: , Rfl:  .  CRANBERRY FRUIT PO, Take by mouth., Disp: , Rfl:  .  finasteride (PROSCAR) 5 MG tablet, TAKE 1 TABLET BY MOUTH EVERY DAY, Disp: 90 tablet, Rfl: 1 .  insulin NPH Human (NOVOLIN N) 100 UNIT/ML injection, Inject 0.4 mLs (40 Units total) into the skin at bedtime. May use reduced dose as advised. (Patient taking differently: Inject 30-45 Units into the skin at bedtime. May use reduced dose as advised.), Disp: 40 mL, Rfl: 1 .  JARDIANCE 25 MG TABS tablet, TAKE 1 TABLET BY MOUTH EVERY DAY, Disp: 90 tablet, Rfl: 1 .  metFORMIN (GLUCOPHAGE) 1000 MG tablet, Take 1 tablet (1,000 mg total) by mouth 2 (two) times daily with a meal., Disp: 180 tablet, Rfl: 1 .  mupirocin ointment (BACTROBAN) 2 %, Place 1 application into the nose 2 (two) times daily., Disp: 22 g, Rfl: 0 .  OZEMPIC, 1 MG/DOSE, 2 MG/1.5ML SOPN, Inject 1 mg into the skin once a week., Disp: 2 pen, Rfl: 5 .  Probiotic Product (ALIGN) 4 MG CAPS, Take 2 each by mouth daily. , Disp: , Rfl:  .  psyllium (METAMUCIL) 58.6 % packet, Take 1 packet by mouth 2 (two) times a week. ,  Disp: , Rfl:  .  tamsulosin (FLOMAX) 0.4 MG CAPS capsule, Take 1 capsule (0.4 mg total) by mouth daily after supper., Disp: 90 capsule, Rfl: 1 .  valsartan (DIOVAN) 80 MG tablet, Take 1 tablet (80 mg total) by mouth daily., Disp: 90 tablet, Rfl: 3 .  vitamin E 400 UNIT capsule, Take 400 Units by mouth daily., Disp: , Rfl:   Allergies  Allergen Reactions  . Testosterone Rash    patch     Varicose veins of bilateral lower extremities with pain    PLAN: The patient's right lower extremity was sterilely prepped and draped. The ultrasound machine was used to visualize the saphenous vein throughout its course. A segment in the  mid to upper calf was selected for access. The saphenous vein was accessed without difficulty using ultrasound guidance with a micropuncture needle. A 0.018 wire was then placed beyond the saphenofemoral junction and the needle was removed. The 65 cm sheath was then placed over the wire and the wire and dilator were removed. The laser fiber was then placed through the sheath and its tip was placed approximately 4-5 centimeters below the saphenofemoral junction. Tumescent anesthesia was then created with a dilute lidocaine solution. Laser energy was then delivered with constant withdrawal of the sheath and laser fiber. Approximately 1597 joules of energy were delivered over a length of 38 centimeters using a 1470 Hz VenaCure machine at 7 W. Sterile dressings were placed. The patient tolerated the procedure well without obvious complications.   Follow-up in 1 week with post-laser duplex.

## 2020-06-16 ENCOUNTER — Other Ambulatory Visit: Payer: Self-pay

## 2020-06-16 ENCOUNTER — Ambulatory Visit (INDEPENDENT_AMBULATORY_CARE_PROVIDER_SITE_OTHER): Payer: Medicare HMO

## 2020-06-16 ENCOUNTER — Other Ambulatory Visit (INDEPENDENT_AMBULATORY_CARE_PROVIDER_SITE_OTHER): Payer: Self-pay | Admitting: Vascular Surgery

## 2020-06-16 DIAGNOSIS — I83813 Varicose veins of bilateral lower extremities with pain: Secondary | ICD-10-CM

## 2020-07-08 ENCOUNTER — Ambulatory Visit: Payer: Self-pay | Admitting: *Deleted

## 2020-07-08 ENCOUNTER — Other Ambulatory Visit: Payer: Medicare HMO

## 2020-07-08 NOTE — Telephone Encounter (Signed)
Patient is calling with classic COVID symptoms. Patient has appointment today at [facility](3pm) for testing. Attempted to call office for appointment- got VM. Patient advised message would be sent for review and would get call back.  Reason for Disposition . [1] HIGH RISK patient (e.g., age > 19 years, diabetes, heart or lung disease, weak immune system) AND [2] new or worsening symptoms  Answer Assessment - Initial Assessment Questions 1. COVID-19 DIAGNOSIS: "Who made your Coronavirus (COVID-19) diagnosis?" "Was it confirmed by a positive lab test?" If not diagnosed by a HCP, ask "Are there lots of cases (community spread) where you live?" (See public health department website, if unsure)     yes 2. COVID-19 EXPOSURE: "Was there any known exposure to COVID before the symptoms began?" CDC Definition of close contact: within 6 feet (2 meters) for a total of 15 minutes or more over a 24-hour period.      no 3. ONSET: "When did the COVID-19 symptoms start?"      Saturday 4. WORST SYMPTOM: "What is your worst symptom?" (e.g., cough, fever, shortness of breath, muscle aches)     Cough- tightness in chest 5. COUGH: "Do you have a cough?" If Yes, ask: "How bad is the cough?"       Cough- tightness in chest- spasms 6. FEVER: "Do you have a fever?" If Yes, ask: "What is your temperature, how was it measured, and when did it start?"     chills, body aches 7. RESPIRATORY STATUS: "Describe your breathing?" (e.g., shortness of breath, wheezing, unable to speak)     labored 8. BETTER-SAME-WORSE: "Are you getting better, staying the same or getting worse compared to yesterday?"  If getting worse, ask, "In what way?"     Worse- labored 9. HIGH RISK DISEASE: "Do you have any chronic medical problems?" (e.g., asthma, heart or lung disease, weak immune system, obesity, etc.)     History bronchitis, diabetes 10. PREGNANCY: "Is there any chance you are pregnant?" "When was your last menstrual period?"       n/a 11.  OTHER SYMPTOMS: "Do you have any other symptoms?"  (e.g., chills, fatigue, headache, loss of smell or taste, muscle pain, sore throat; new loss of smell or taste especially support the diagnosis of COVID-19)       Chills, fatigue, sore throat  Protocols used: CORONAVIRUS (COVID-19) DIAGNOSED OR SUSPECTED-A-AH

## 2020-07-08 NOTE — Telephone Encounter (Signed)
Call to patient- offered appointment today- he declines- he wants his teste result first. Scheduled appointment for Thursday am- advised patient to call back if he has worsening symptoms.

## 2020-07-10 ENCOUNTER — Telehealth (INDEPENDENT_AMBULATORY_CARE_PROVIDER_SITE_OTHER): Payer: Medicare HMO | Admitting: Family Medicine

## 2020-07-10 ENCOUNTER — Other Ambulatory Visit: Payer: Self-pay

## 2020-07-10 ENCOUNTER — Other Ambulatory Visit: Payer: Self-pay | Admitting: Family Medicine

## 2020-07-10 ENCOUNTER — Encounter: Payer: Self-pay | Admitting: Family Medicine

## 2020-07-10 DIAGNOSIS — U071 COVID-19: Secondary | ICD-10-CM

## 2020-07-10 DIAGNOSIS — R509 Fever, unspecified: Secondary | ICD-10-CM

## 2020-07-10 MED ORDER — AZITHROMYCIN 250 MG PO TABS: ORAL_TABLET | ORAL | 0 refills | Status: DC

## 2020-07-10 MED ORDER — ALBUTEROL SULFATE HFA 108 (90 BASE) MCG/ACT IN AERS: 1.0000 | INHALATION_SPRAY | RESPIRATORY_TRACT | 1 refills | Status: DC | PRN

## 2020-07-10 NOTE — Telephone Encounter (Signed)
Apologies- forwarded to wrong office 

## 2020-07-10 NOTE — Patient Instructions (Addendum)
Can take over the counter ibuprofen 600mg  every 6 hours as needed for fever and discomfort  Can take acetaminophen 1000mg  every 8 hours as needed for fever.  Do not take more than 3000mg  (or 3g) in 24 hours  I have sent in a prescription for your inhaler to take 1-2 puffs every 4-6 hours as needed for shortness of breath/cough/wheezing.  Recovery from a viral infection can take a toll on you and provide you with some fatigue that takes a few weeks to recover from.  I would increase your protein intake and can take over the counter elderberry, zinc and vitamin C to help with your recovery process.  [employer] does have a gummy that is over the counter that does include all 3 ingredients.  As we discussed, if your symptoms continue with fever over the weekend, I have sent in a prescription for azithromycin, to take as directed.    We will plan to see you back if your symptoms worsen or fail to improve  You will receive a survey after today's visit either digitally by e-mail or paper by USPS mail. Your experiences and feedback matter to [place].  Please respond so we know how we are doing as we provide care for you.  Call us with any questions/concerns/needs.  It is my goal to be available to you for your health concerns.  Thanks for choosing me to be a partner in your healthcare needs!  Harlin Rain, FNP-C Family Nurse Practitioner [facility] Phone: [phone]

## 2020-07-10 NOTE — Assessment & Plan Note (Signed)
COVID +, reported test results received 07/08/2020, with positive COVID exposure on 07/05/2020.  Is currently on day 4 of a fever and alternating ibuprofen and acetaminophen every 6 hours.  Has concerns for prolonged fever, discussed COVID course and fever being a part of the viral process.  Discussed if fever was to continue > 7 days or to feel better and then to have worsening of symptoms with fever, we would typically treat with antibiotics then to cover for a suspected bacterial component.  Patient will be at day 7 on Sunday and would feel better having a prescription waiting at pharmacy should he need it.  Discussed would be able to do this and educated on antibiotics not being effective in viral illnesses and concerns if taking an antibiotic and not needing it how this can lead to antibiotic resistance.  Patient verbalized understanding.  Plan: 1. Albuterol refill sent to pharmacy on file 2. Encouraged to increase protein, can take over the counter elderberry, zinc and vitamin C to help with immune support 3. Can continue ibuprofen and/or acetaminophen, according to packaging directions 4. Sent in rx for azithromycin, to begin on Sunday, if having worsening of symptoms to cover for bacterial component 5. RTC if symptoms worsen or fail to improve

## 2020-07-10 NOTE — Progress Notes (Addendum)
Virtual Visit via MyChart Video Visit  The purpose of this virtual visit is to provide medical care while limiting exposure to the novel coronavirus (COVID19) for both patient and office staff.  Consent was obtained for phone visit:  Yes.   Answered questions that patient had about telehealth interaction:  Yes.   I discussed the limitations, risks, security and privacy concerns of performing an evaluation and management service by telephone. I also discussed with the patient that there may be a patient responsible charge related to this service. The patient expressed understanding and agreed to proceed.  Patient is at home and is accessed via [place] are provided by Harlin Rain, FNP-C from [facility])  ---------------------------------------------------------------------- Chief Complaint  Patient presents with  . Covid Exposure    pt tested positive for COVID on yesterday at his local pharmacy. He complains of intermittent  fever  100.1- 101.0 mostly at bedtime. He is currently alternating taking Tylenol every six hours and Ibuprofen every six hour . He also currently taking a cough syrup. Mild /rare coughing, bodyaches, mild headache and chills.     S: Reviewed CMA documentation. I have called patient and gathered additional HPI as follows:  Mr. Lince presents for MyChart Video Visit for concerns of positive COVID test on Tuesday with fever and chills.  Reports he was exposed to a sick contact on Saturday, tested positive on Tuesday, has been medicating with ibuprofen and acetaminophen every 6 hours for fever control.  When unmedicated, his fever is approx 101F.  Denies any shortness of breath or cough currently.  Requesting refill on his inhaler and has acute concerns for persistent fever (currently on day 4)  Patient is currently home in isolation Denies any high risk travel to areas of current concern for COVID19.  Past Medical  History:  Diagnosis Date  . Allergy   . BPH (benign prostatic hyperplasia)   . Colon polyps   . Diabetes ([place])   . Diverticulosis   . Gallstones   . GERD (gastroesophageal reflux disease)   . HOH (hard of hearing)   . Kidney stones   . Renal disorder   . Wears glasses    Social History   Tobacco Use  . Smoking status: Former Smoker    Types: Cigarettes    Quit date: 1970    Years since quitting: 51.6  . Smokeless tobacco: Former Systems developer    Types: Snuff  Vaping Use  . Vaping Use: Never used  Substance Use Topics  . Alcohol use: Not Currently  . Drug use: Not Currently    Comment: past    Current Outpatient Medications:  .  ascorbic acid (VITAMIN C) 500 MG tablet, Take 500 mg by mouth daily., Disp: , Rfl:  .  aspirin 81 MG chewable tablet, Chew 81 mg by mouth daily. , Disp: , Rfl:  .  atorvastatin (LIPITOR) 10 MG tablet, Take 1 tablet (10 mg total) by mouth daily at 6 PM., Disp: 90 tablet, Rfl: 1 .  b complex vitamins tablet, Take 1 tablet by mouth daily., Disp: , Rfl:  .  CRANBERRY FRUIT PO, Take by mouth., Disp: , Rfl:  .  finasteride (PROSCAR) 5 MG tablet, TAKE 1 TABLET BY MOUTH EVERY DAY, Disp: 90 tablet, Rfl: 1 .  insulin NPH Human (NOVOLIN N) 100 UNIT/ML injection, Inject 0.4 mLs (40 Units total) into the skin at bedtime. May use reduced dose as advised. (Patient taking differently: Inject 30-45 Units into  the skin at bedtime. May use reduced dose as advised.), Disp: 40 mL, Rfl: 1 .  JARDIANCE 25 MG TABS tablet, TAKE 1 TABLET BY MOUTH EVERY DAY, Disp: 90 tablet, Rfl: 1 .  metFORMIN (GLUCOPHAGE) 1000 MG tablet, Take 1 tablet (1,000 mg total) by mouth 2 (two) times daily with a meal., Disp: 180 tablet, Rfl: 1 .  mupirocin ointment (BACTROBAN) 2 %, Place 1 application into the nose 2 (two) times daily., Disp: 22 g, Rfl: 0 .  OZEMPIC, 1 MG/DOSE, 2 MG/1.5ML SOPN, Inject 1 mg into the skin once a week., Disp: 2 pen, Rfl: 5 .  Probiotic Product (ALIGN) 4 MG CAPS, Take 2 each by  mouth daily. , Disp: , Rfl:  .  tamsulosin (FLOMAX) 0.4 MG CAPS capsule, Take 1 capsule (0.4 mg total) by mouth daily after supper., Disp: 90 capsule, Rfl: 1 .  valsartan (DIOVAN) 80 MG tablet, Take 1 tablet (80 mg total) by mouth daily., Disp: 90 tablet, Rfl: 3 .  vitamin E 400 UNIT capsule, Take 400 Units by mouth daily., Disp: , Rfl:  .  albuterol (VENTOLIN HFA) 108 (90 Base) MCG/ACT inhaler, Inhale 1-2 puffs into the lungs every 4 (four) hours as needed for wheezing or shortness of breath., Disp: 6.7 g, Rfl: 1 .  azithromycin (ZITHROMAX) 250 MG tablet, Take 2 tablets on day 1, then 1 tablet daily for the following 4 days, Disp: 6 tablet, Rfl: 0  Depression screen [facility] 2/9 03/18/2020 02/11/2020 10/08/2019  Decreased Interest 0 0 0  Down, Depressed, Hopeless 0 0 0  PHQ - 2 Score 0 0 0  Altered sleeping - - -  Tired, decreased energy - - -  Change in appetite - - -  Feeling bad or failure about yourself  - - -  Trouble concentrating - - -  Moving slowly or fidgety/restless - - -  Suicidal thoughts - - -  PHQ-9 Score - - -  Difficult doing work/chores - - -    No flowsheet data found.  -------------------------------------------------------------------------- O: No physical exam performed due to remote telephone encounter.  Physical Exam: Patient remotely monitored with video.  Verbal communication appropriate.  Cognition normal.  No results found for this or any previous visit (from the past 2160 hour(s)).  -------------------------------------------------------------------------- A&P:  Problem List Items Addressed This Visit      Other   COVID-19 - Primary    COVID +, reported test results received 07/08/2020, with positive COVID exposure on 07/05/2020.  Is currently on day 4 of a fever and alternating ibuprofen and acetaminophen every 6 hours.  Has concerns for prolonged fever, discussed COVID course and fever being a part of the viral process.  Discussed if fever was to  continue > 7 days or to feel better and then to have worsening of symptoms with fever, we would typically treat with antibiotics then to cover for a suspected bacterial component.  Patient will be at day 7 on Sunday and would feel better having a prescription waiting at pharmacy should he need it.  Discussed would be able to do this and educated on antibiotics not being effective in viral illnesses and concerns if taking an antibiotic and not needing it how this can lead to antibiotic resistance.  Patient verbalized understanding.  Plan: 1. Albuterol refill sent to pharmacy on file 2. Encouraged to increase protein, can take over the counter elderberry, zinc and vitamin C to help with immune support 3. Can continue ibuprofen and/or acetaminophen, according to packaging  directions 4. Sent in rx for azithromycin, to begin on Sunday, if having worsening of symptoms to cover for bacterial component 5. RTC if symptoms worsen or fail to improve      Relevant Medications   albuterol (VENTOLIN HFA) 108 (90 Base) MCG/ACT inhaler   azithromycin (ZITHROMAX) 250 MG tablet   Fever   Relevant Medications   albuterol (VENTOLIN HFA) 108 (90 Base) MCG/ACT inhaler   azithromycin (ZITHROMAX) 250 MG tablet      Meds ordered this encounter  Medications  . albuterol (VENTOLIN HFA) 108 (90 Base) MCG/ACT inhaler    Sig: Inhale 1-2 puffs into the lungs every 4 (four) hours as needed for wheezing or shortness of breath.    Dispense:  6.7 g    Refill:  1  . azithromycin (ZITHROMAX) 250 MG tablet    Sig: Take 2 tablets on day 1, then 1 tablet daily for the following 4 days    Dispense:  6 tablet    Refill:  0    Follow-up: - Return to clinic if symptoms worsen or fail to improve  Patient verbalizes understanding with the above medical recommendations including the limitation of remote medical advice.  Specific follow-up and call-back criteria were given for patient to follow-up or seek medical care more  urgently if needed.  - Time spent in direct consultation with patient on phone: 7 minutes  Harlin Rain, [facility] 07/10/2020, 10:33 AM

## 2020-07-10 NOTE — Telephone Encounter (Signed)
This is not a patient at [facility].

## 2020-07-10 NOTE — Telephone Encounter (Signed)
Pharmacy requesting alternative Rx inhaler - Rx prescribed is not preferred- sent for review of request

## 2020-07-11 ENCOUNTER — Telehealth: Payer: Medicare HMO | Admitting: Family Medicine

## 2020-07-15 ENCOUNTER — Encounter: Payer: Medicare HMO | Admitting: Family Medicine

## 2020-07-15 ENCOUNTER — Ambulatory Visit (INDEPENDENT_AMBULATORY_CARE_PROVIDER_SITE_OTHER): Payer: Medicare HMO | Admitting: Vascular Surgery

## 2020-07-16 ENCOUNTER — Encounter (HOSPITAL_COMMUNITY): Payer: Self-pay | Admitting: Emergency Medicine

## 2020-07-16 ENCOUNTER — Inpatient Hospital Stay (HOSPITAL_COMMUNITY)
Admission: EM | Admit: 2020-07-16 | Discharge: 2020-07-23 | DRG: 177 | Disposition: A | Discharge status: Home Health | Payer: Medicare HMO | Attending: Internal Medicine | Admitting: Internal Medicine

## 2020-07-16 ENCOUNTER — Other Ambulatory Visit: Payer: Self-pay

## 2020-07-16 ENCOUNTER — Emergency Department (HOSPITAL_COMMUNITY): Payer: Medicare HMO

## 2020-07-16 DIAGNOSIS — E1169 Type 2 diabetes mellitus with other specified complication: Secondary | ICD-10-CM | POA: Diagnosis present

## 2020-07-16 DIAGNOSIS — K219 Gastro-esophageal reflux disease without esophagitis: Secondary | ICD-10-CM | POA: Diagnosis present

## 2020-07-16 DIAGNOSIS — Z794 Long term (current) use of insulin: Secondary | ICD-10-CM | POA: Diagnosis not present

## 2020-07-16 DIAGNOSIS — Z823 Family history of stroke: Secondary | ICD-10-CM | POA: Diagnosis not present

## 2020-07-16 DIAGNOSIS — N401 Enlarged prostate with lower urinary tract symptoms: Secondary | ICD-10-CM | POA: Diagnosis present

## 2020-07-16 DIAGNOSIS — Z8249 Family history of ischemic heart disease and other diseases of the circulatory system: Secondary | ICD-10-CM | POA: Diagnosis not present

## 2020-07-16 DIAGNOSIS — J1282 Pneumonia due to coronavirus disease 2019: Secondary | ICD-10-CM | POA: Diagnosis present

## 2020-07-16 DIAGNOSIS — E785 Hyperlipidemia, unspecified: Secondary | ICD-10-CM | POA: Diagnosis present

## 2020-07-16 DIAGNOSIS — J069 Acute upper respiratory infection, unspecified: Secondary | ICD-10-CM | POA: Diagnosis not present

## 2020-07-16 DIAGNOSIS — E871 Hypo-osmolality and hyponatremia: Secondary | ICD-10-CM | POA: Diagnosis present

## 2020-07-16 DIAGNOSIS — E669 Obesity, unspecified: Secondary | ICD-10-CM | POA: Diagnosis present

## 2020-07-16 DIAGNOSIS — U071 COVID-19: Secondary | ICD-10-CM | POA: Diagnosis present

## 2020-07-16 DIAGNOSIS — R3914 Feeling of incomplete bladder emptying: Secondary | ICD-10-CM | POA: Diagnosis present

## 2020-07-16 DIAGNOSIS — Z79899 Other long term (current) drug therapy: Secondary | ICD-10-CM | POA: Diagnosis not present

## 2020-07-16 DIAGNOSIS — Z833 Family history of diabetes mellitus: Secondary | ICD-10-CM

## 2020-07-16 DIAGNOSIS — Z7982 Long term (current) use of aspirin: Secondary | ICD-10-CM

## 2020-07-16 DIAGNOSIS — Z6834 Body mass index (BMI) 34.0-34.9, adult: Secondary | ICD-10-CM | POA: Diagnosis not present

## 2020-07-16 DIAGNOSIS — Z87891 Personal history of nicotine dependence: Secondary | ICD-10-CM

## 2020-07-16 DIAGNOSIS — J9601 Acute respiratory failure with hypoxia: Secondary | ICD-10-CM | POA: Diagnosis present

## 2020-07-16 DIAGNOSIS — E119 Type 2 diabetes mellitus without complications: Secondary | ICD-10-CM | POA: Diagnosis present

## 2020-07-16 LAB — CBC WITH DIFFERENTIAL/PLATELET
Abs Immature Granulocytes: 0.04 10*3/uL (ref 0.00–0.07)
Basophils Absolute: 0 10*3/uL (ref 0.0–0.1)
Basophils Relative: 0 %
Eosinophils Absolute: 0 10*3/uL (ref 0.0–0.5)
Eosinophils Relative: 0 %
HCT: 47.6 % (ref 39.0–52.0)
Hemoglobin: 16 g/dL (ref 13.0–17.0)
Immature Granulocytes: 1 %
Lymphocytes Relative: 12 %
Lymphs Abs: 0.8 10*3/uL (ref 0.7–4.0)
MCH: 29.6 pg (ref 26.0–34.0)
MCHC: 33.6 g/dL (ref 30.0–36.0)
MCV: 88 fL (ref 80.0–100.0)
Monocytes Absolute: 0.4 10*3/uL (ref 0.1–1.0)
Monocytes Relative: 6 %
Neutro Abs: 5.2 10*3/uL (ref 1.7–7.7)
Neutrophils Relative %: 81 %
Platelets: 200 10*3/uL (ref 150–400)
RBC: 5.41 MIL/uL (ref 4.22–5.81)
RDW: 14.7 % (ref 11.5–15.5)
WBC: 6.5 10*3/uL (ref 4.0–10.5)
nRBC: 0 % (ref 0.0–0.2)

## 2020-07-16 LAB — COMPREHENSIVE METABOLIC PANEL
ALT: 38 U/L (ref 0–44)
AST: 61 U/L — ABNORMAL HIGH (ref 15–41)
Albumin: 3.2 g/dL — ABNORMAL LOW (ref 3.5–5.0)
Alkaline Phosphatase: 51 U/L (ref 38–126)
Anion gap: 18 — ABNORMAL HIGH (ref 5–15)
BUN: 24 mg/dL — ABNORMAL HIGH (ref 8–23)
CO2: 18 mmol/L — ABNORMAL LOW (ref 22–32)
Calcium: 9.5 mg/dL (ref 8.9–10.3)
Chloride: 96 mmol/L — ABNORMAL LOW (ref 98–111)
Creatinine, Ser: 0.97 mg/dL (ref 0.61–1.24)
GFR calc Af Amer: >60 mL/min (ref >60)
GFR calc non Af Amer: >60 mL/min (ref >60)
Glucose, Bld: 130 mg/dL — ABNORMAL HIGH (ref 70–99)
Potassium: 4.1 mmol/L (ref 3.5–5.1)
Sodium: 132 mmol/L — ABNORMAL LOW (ref 135–145)
Total Bilirubin: 1.2 mg/dL (ref 0.3–1.2)
Total Protein: 7.9 g/dL (ref 6.5–8.1)

## 2020-07-16 LAB — C-REACTIVE PROTEIN: CRP: 19.5 mg/dL — ABNORMAL HIGH (ref <1.0)

## 2020-07-16 LAB — LACTATE DEHYDROGENASE: LDH: 524 U/L — ABNORMAL HIGH (ref 98–192)

## 2020-07-16 LAB — CBG MONITORING, ED
Glucose-Capillary: 150 mg/dL — ABNORMAL HIGH (ref 70–99)
Glucose-Capillary: 215 mg/dL — ABNORMAL HIGH (ref 70–99)

## 2020-07-16 LAB — LACTIC ACID, PLASMA: Lactic Acid, Venous: 1.3 mmol/L (ref 0.5–1.9)

## 2020-07-16 LAB — FERRITIN: Ferritin: 1283 ng/mL — ABNORMAL HIGH (ref 24–336)

## 2020-07-16 LAB — FIBRINOGEN: Fibrinogen: 729 mg/dL — ABNORMAL HIGH (ref 210–475)

## 2020-07-16 LAB — TRIGLYCERIDES: Triglycerides: 119 mg/dL (ref <150)

## 2020-07-16 LAB — PROCALCITONIN: Procalcitonin: <0.1 ng/mL

## 2020-07-16 LAB — D-DIMER, QUANTITATIVE: D-Dimer, Quant: 4.59 ug/mL-FEU — ABNORMAL HIGH (ref 0.00–0.50)

## 2020-07-16 MED ORDER — ATORVASTATIN CALCIUM 10 MG PO TABS
10.0000 mg | ORAL_TABLET | Freq: Every day | ORAL | Status: DC
  Administered 2020-07-16 – 2020-07-23 (×8): 10 mg via ORAL
  Filled 2020-07-16 (×8): qty 1

## 2020-07-16 MED ORDER — SODIUM CHLORIDE 0.9 % IV SOLN
1.0000 g | Freq: Once | INTRAVENOUS | Status: DC
  Administered 2020-07-16: 1 g via INTRAVENOUS
  Filled 2020-07-16: qty 10

## 2020-07-16 MED ORDER — ENOXAPARIN SODIUM 40 MG/0.4ML ~~LOC~~ SOLN: 40.0000 mg | SUBCUTANEOUS | Status: DC

## 2020-07-16 MED ORDER — ZINC SULFATE 220 (50 ZN) MG PO CAPS
220.0000 mg | ORAL_CAPSULE | Freq: Every day | ORAL | Status: DC
  Administered 2020-07-16 – 2020-07-23 (×8): 220 mg via ORAL
  Filled 2020-07-16 (×8): qty 1

## 2020-07-16 MED ORDER — TAMSULOSIN HCL 0.4 MG PO CAPS
0.4000 mg | ORAL_CAPSULE | Freq: Every day | ORAL | Status: DC
  Administered 2020-07-16 – 2020-07-23 (×8): 0.4 mg via ORAL
  Filled 2020-07-16 (×8): qty 1

## 2020-07-16 MED ORDER — ONDANSETRON HCL 4 MG/2ML IJ SOLN: 4.0000 mg | Freq: Four times a day (QID) | INTRAMUSCULAR | Status: DC | PRN

## 2020-07-16 MED ORDER — INSULIN ASPART 100 UNIT/ML ~~LOC~~ SOLN
0.0000 [IU] | Freq: Three times a day (TID) | SUBCUTANEOUS | Status: DC
  Administered 2020-07-16: 2 [IU] via SUBCUTANEOUS
  Administered 2020-07-17: 5 [IU] via SUBCUTANEOUS
  Administered 2020-07-17: 3 [IU] via SUBCUTANEOUS
  Administered 2020-07-17: 5 [IU] via SUBCUTANEOUS
  Administered 2020-07-18: 8 [IU] via SUBCUTANEOUS
  Administered 2020-07-18: 15 [IU] via SUBCUTANEOUS
  Administered 2020-07-18: 8 [IU] via SUBCUTANEOUS
  Administered 2020-07-19: 11 [IU] via SUBCUTANEOUS
  Administered 2020-07-19: 8 [IU] via SUBCUTANEOUS
  Administered 2020-07-20: 11 [IU] via SUBCUTANEOUS
  Filled 2020-07-16: qty 0.15

## 2020-07-16 MED ORDER — ASCORBIC ACID 500 MG PO TABS
500.0000 mg | ORAL_TABLET | Freq: Every day | ORAL | Status: DC
  Administered 2020-07-16 – 2020-07-23 (×8): 500 mg via ORAL
  Filled 2020-07-16 (×8): qty 1

## 2020-07-16 MED ORDER — ONDANSETRON HCL 4 MG PO TABS: 4.0000 mg | ORAL_TABLET | Freq: Four times a day (QID) | ORAL | Status: DC | PRN

## 2020-07-16 MED ORDER — LEVALBUTEROL TARTRATE 45 MCG/ACT IN AERO
1.0000 | INHALATION_SPRAY | Freq: Four times a day (QID) | RESPIRATORY_TRACT | Status: DC | PRN
  Filled 2020-07-16: qty 15

## 2020-07-16 MED ORDER — INSULIN DETEMIR 100 UNIT/ML ~~LOC~~ SOLN
10.0000 [IU] | Freq: Every day | SUBCUTANEOUS | Status: DC
  Administered 2020-07-17: 10 [IU] via SUBCUTANEOUS
  Filled 2020-07-16 (×3): qty 0.1

## 2020-07-16 MED ORDER — FINASTERIDE 5 MG PO TABS
5.0000 mg | ORAL_TABLET | Freq: Every day | ORAL | Status: DC
  Administered 2020-07-17 – 2020-07-23 (×7): 5 mg via ORAL
  Filled 2020-07-16 (×7): qty 1

## 2020-07-16 MED ORDER — B COMPLEX PO TABS: 1.0000 | ORAL_TABLET | Freq: Every day | ORAL | Status: DC

## 2020-07-16 MED ORDER — ENOXAPARIN SODIUM 60 MG/0.6ML ~~LOC~~ SOLN
0.5000 mg/kg | Freq: Two times a day (BID) | SUBCUTANEOUS | Status: DC
  Administered 2020-07-16 – 2020-07-23 (×14): 55 mg via SUBCUTANEOUS
  Filled 2020-07-16 (×11): qty 0.6
  Filled 2020-07-16 (×2): qty 0.55
  Filled 2020-07-16: qty 0.6
  Filled 2020-07-16: qty 0.55

## 2020-07-16 MED ORDER — B COMPLEX-C PO TABS
1.0000 | ORAL_TABLET | Freq: Every day | ORAL | Status: DC
  Administered 2020-07-17 – 2020-07-23 (×7): 1 via ORAL
  Filled 2020-07-16 (×7): qty 1

## 2020-07-16 MED ORDER — LINAGLIPTIN 5 MG PO TABS
5.0000 mg | ORAL_TABLET | Freq: Every day | ORAL | Status: DC
  Administered 2020-07-17 – 2020-07-23 (×7): 5 mg via ORAL
  Filled 2020-07-16 (×8): qty 1

## 2020-07-16 MED ORDER — GUAIFENESIN-DM 100-10 MG/5ML PO SYRP
10.0000 mL | ORAL_SOLUTION | ORAL | Status: DC | PRN
  Administered 2020-07-18: 10 mL via ORAL
  Filled 2020-07-16: qty 10

## 2020-07-16 MED ORDER — DEXAMETHASONE SODIUM PHOSPHATE 10 MG/ML IJ SOLN
10.0000 mg | Freq: Once | INTRAMUSCULAR | Status: AC
  Administered 2020-07-16: 10 mg via INTRAVENOUS
  Filled 2020-07-16: qty 1

## 2020-07-16 MED ORDER — SODIUM CHLORIDE 0.9% FLUSH
3.0000 mL | Freq: Two times a day (BID) | INTRAVENOUS | Status: DC
  Administered 2020-07-16 – 2020-07-23 (×14): 3 mL via INTRAVENOUS

## 2020-07-16 MED ORDER — HYDROCOD POLST-CPM POLST ER 10-8 MG/5ML PO SUER
5.0000 mL | Freq: Two times a day (BID) | ORAL | Status: DC | PRN
  Administered 2020-07-18 – 2020-07-22 (×6): 5 mL via ORAL
  Filled 2020-07-16 (×6): qty 5

## 2020-07-16 MED ORDER — ACETAMINOPHEN 325 MG PO TABS
650.0000 mg | ORAL_TABLET | Freq: Four times a day (QID) | ORAL | Status: DC | PRN
  Administered 2020-07-17: 650 mg via ORAL
  Filled 2020-07-16: qty 2

## 2020-07-16 MED ORDER — INSULIN ASPART 100 UNIT/ML ~~LOC~~ SOLN
0.0000 [IU] | Freq: Every day | SUBCUTANEOUS | Status: DC
  Administered 2020-07-16: 2 [IU] via SUBCUTANEOUS
  Administered 2020-07-17 – 2020-07-19 (×2): 3 [IU] via SUBCUTANEOUS
  Administered 2020-07-20: 5 [IU] via SUBCUTANEOUS
  Administered 2020-07-21: 4 [IU] via SUBCUTANEOUS
  Administered 2020-07-22: 5 [IU] via SUBCUTANEOUS
  Filled 2020-07-16: qty 0.05

## 2020-07-16 MED ORDER — SODIUM CHLORIDE 0.9 % IV SOLN
1000.0000 mL | INTRAVENOUS | Status: DC
  Administered 2020-07-16: 1000 mL via INTRAVENOUS

## 2020-07-16 MED ORDER — SODIUM CHLORIDE 0.9 % IV SOLN
500.0000 mg | Freq: Once | INTRAVENOUS | Status: DC
  Filled 2020-07-16: qty 500

## 2020-07-16 MED ORDER — VITAMIN E 180 MG (400 UNIT) PO CAPS
400.0000 [IU] | ORAL_CAPSULE | Freq: Every day | ORAL | Status: DC
  Administered 2020-07-17 – 2020-07-23 (×7): 400 [IU] via ORAL
  Filled 2020-07-16 (×7): qty 1

## 2020-07-16 MED ORDER — METHYLPREDNISOLONE SODIUM SUCC 125 MG IJ SOLR
80.0000 mg | Freq: Two times a day (BID) | INTRAMUSCULAR | Status: DC
  Administered 2020-07-16 – 2020-07-21 (×10): 80 mg via INTRAVENOUS
  Filled 2020-07-16 (×10): qty 2

## 2020-07-16 MED ORDER — IRBESARTAN 150 MG PO TABS
75.0000 mg | ORAL_TABLET | Freq: Every day | ORAL | Status: DC
  Administered 2020-07-17 – 2020-07-23 (×7): 75 mg via ORAL
  Filled 2020-07-16 (×8): qty 1

## 2020-07-16 MED ORDER — SODIUM CHLORIDE 0.9 % IV SOLN
200.0000 mg | Freq: Once | INTRAVENOUS | Status: AC
  Administered 2020-07-16: 200 mg via INTRAVENOUS
  Filled 2020-07-16: qty 200

## 2020-07-16 MED ORDER — ASPIRIN 81 MG PO CHEW
81.0000 mg | CHEWABLE_TABLET | Freq: Every day | ORAL | Status: DC
  Administered 2020-07-17 – 2020-07-23 (×7): 81 mg via ORAL
  Filled 2020-07-16 (×8): qty 1

## 2020-07-16 MED ORDER — SODIUM CHLORIDE 0.9 % IV SOLN
100.0000 mg | Freq: Every day | INTRAVENOUS | Status: AC
  Administered 2020-07-17 – 2020-07-20 (×4): 100 mg via INTRAVENOUS
  Filled 2020-07-16 (×4): qty 20

## 2020-07-16 MED ORDER — ALIGN 4 MG PO CAPS: 2.0000 | ORAL_CAPSULE | Freq: Every day | ORAL | Status: DC

## 2020-07-16 NOTE — ED Notes (Signed)
Assisted patient with the urinal, he stood at bedside to use it. Increased work of breathing and tachypnea noted. Notified him that he needs to answer the phone when pharmacy calls so his medications can be verified.

## 2020-07-16 NOTE — ED Provider Notes (Signed)
[facility] DEPT Provider Note   CSN: [id number] Arrival date & time: 07/16/20  1325     History No chief complaint on file.   Jonathan Shelton is a 69 y.o. male.  Pt presents to the ED today with weakness and sob.  He was diagnosed with Covid on 8/26.  He was exposed to someone with Covid on 8/22.  The pt has been alternating tylenol and ibuprofen for fever.  He has been using his inhaler.   He has not been covid vaccinated.  RA O2 sat 82% in triage.        Past Medical History:  Diagnosis Date  . Allergy   . BPH (benign prostatic hyperplasia)   . Colon polyps   . Diabetes ([place])   . Diverticulosis   . Gallstones   . GERD (gastroesophageal reflux disease)   . HOH (hard of hearing)   . Kidney stones   . Renal disorder   . Wears glasses     Patient Active Problem List   Diagnosis Date Noted  . Acute respiratory disease due to COVID-19 virus 07/16/2020  . COVID-19 07/10/2020  . Fever 07/10/2020  . Cecal polyp 02/15/2020  . Hx of adenomatous polyp of colon 02/15/2020  . Abnormal colonoscopy 02/15/2020  . Tubular adenoma of colon 02/11/2020  . Varicose veins of bilateral lower extremities with pain 07/04/2019  . Type 2 diabetes mellitus with other specified complication ([place]) 17/40/8144  . Benign prostatic hyperplasia with incomplete bladder emptying 05/30/2019  . Obesity (BMI 30.0-34.9) 05/30/2019    Past Surgical History:  Procedure Laterality Date  . CARPAL TUNNEL RELEASE    . CHOLECYSTECTOMY    . COLONOSCOPY WITH PROPOFOL N/A 02/06/2020   Procedure: COLONOSCOPY WITH PROPOFOL;  Surgeon: Lin Landsman, MD;  Location: [facility] ENDOSCOPY;  Service: Gastroenterology;  Laterality: N/A;  . COLONOSCOPY WITH PROPOFOL N/A 03/31/2020   Procedure: COLONOSCOPY WITH PROPOFOL;  Surgeon: Rush Landmark Telford Nab., MD;  Location: [place];  Service: Gastroenterology;  Laterality: N/A;  . ENDOSCOPIC MUCOSAL RESECTION N/A 03/31/2020   Procedure:  ENDOSCOPIC MUCOSAL RESECTION;  Surgeon: Rush Landmark Telford Nab., MD;  Location: [place];  Service: Gastroenterology;  Laterality: N/A;  . HEMOSTASIS CLIP PLACEMENT  03/31/2020   Procedure: HEMOSTASIS CLIP PLACEMENT;  Surgeon: Irving Copas., MD;  Location: [place];  Service: Gastroenterology;;  . HERNIA REPAIR    . POLYPECTOMY  03/31/2020   Procedure: POLYPECTOMY;  Surgeon: Mansouraty, Telford Nab., MD;  Location: [place];  Service: Gastroenterology;;  . SMALL INTESTINE SURGERY    . SUBMUCOSAL LIFTING INJECTION  03/31/2020   Procedure: SUBMUCOSAL LIFTING INJECTION;  Surgeon: Rush Landmark Telford Nab., MD;  Location: [facility] ENDOSCOPY;  Service: Gastroenterology;;       Family History  Problem Relation Age of Onset  . Heart disease Mother 33  . Heart disease Father   . Stroke Father 7  . Diabetes Father   . Heart attack Father   . Liver disease Neg Hx   . Colon cancer Neg Hx   . Esophageal cancer Neg Hx   . Pancreatic cancer Neg Hx   . Stomach cancer Neg Hx   . Inflammatory bowel disease Neg Hx   . Rectal cancer Neg Hx     Social History   Tobacco Use  . Smoking status: Former Smoker    Types: Cigarettes    Quit date: 1970    Years since quitting: 51.7  . Smokeless tobacco: Former Systems developer    Types: Snuff  Vaping Use  . Vaping Use: Never used  Substance Use Topics  . Alcohol use: Not Currently  . Drug use: Not Currently    Comment: past    Home Medications Prior to Admission medications   Medication Sig Start Date End Date Taking? Authorizing Provider  ascorbic acid (VITAMIN C) 500 MG tablet Take 500 mg by mouth daily.    [provider]  aspirin 81 MG chewable tablet Chew 81 mg by mouth daily.     [provider]  atorvastatin (LIPITOR) 10 MG tablet Take 1 tablet (10 mg total) by mouth daily at 6 PM. 03/03/20   Karamalegos, Devonne Doughty, DO  azithromycin (ZITHROMAX) 250 MG tablet Take 2 tablets on day 1, then 1 tablet daily for the  following 4 days 07/10/20   Verl Bangs, FNP  b complex vitamins tablet Take 1 tablet by mouth daily.    [provider]  CRANBERRY FRUIT PO Take by mouth.    [provider]  finasteride (PROSCAR) 5 MG tablet TAKE 1 TABLET BY MOUTH EVERY DAY 05/24/20   Karamalegos, Devonne Doughty, DO  insulin NPH Human (NOVOLIN N) 100 UNIT/ML injection Inject 0.4 mLs (40 Units total) into the skin at bedtime. May use reduced dose as advised. Patient taking differently: Inject 30-45 Units into the skin at bedtime. May use reduced dose as advised. 03/14/20   Verl Bangs, FNP  JARDIANCE 25 MG TABS tablet TAKE 1 TABLET BY MOUTH EVERY DAY 04/08/20   Parks Ranger, Devonne Doughty, DO  levalbuterol [facility] HFA) 45 MCG/ACT inhaler Inhale 1-2 puffs into the lungs every 6 (six) hours as needed for wheezing. 07/10/20   [name], DO  metFORMIN (GLUCOPHAGE) 1000 MG tablet Take 1 tablet (1,000 mg total) by mouth 2 (two) times daily with a meal. 03/03/20   Karamalegos, Devonne Doughty, DO  mupirocin ointment (BACTROBAN) 2 % Place 1 application into the nose 2 (two) times daily. 03/26/20   Ralene Bathe, MD  OZEMPIC, 1 MG/DOSE, 2 MG/1.5ML SOPN Inject 1 mg into the skin once a week. 03/11/20   Karamalegos, Devonne Doughty, DO  Probiotic Product (ALIGN) 4 MG CAPS Take 2 each by mouth daily.     [provider]  tamsulosin (FLOMAX) 0.4 MG CAPS capsule Take 1 capsule (0.4 mg total) by mouth daily after supper. 03/03/20   Karamalegos, Devonne Doughty, DO  valsartan (DIOVAN) 80 MG tablet Take 1 tablet (80 mg total) by mouth daily. 04/01/20   Karamalegos, Devonne Doughty, DO  vitamin E 400 UNIT capsule Take 400 Units by mouth daily.    [provider]  albuterol (VENTOLIN HFA) 108 (90 Base) MCG/ACT inhaler Inhale 1-2 puffs into the lungs every 4 (four) hours as needed for wheezing or shortness of breath. 07/10/20 07/10/20  Malfi, Lupita Raider, FNP    Allergies    Testosterone  Review of Systems   Review of  Systems  Respiratory: Positive for shortness of breath.   Neurological: Positive for weakness.  All other systems reviewed and are negative.   Physical Exam Updated Vital Signs BP 112/65   Pulse 90   Temp 98.6 F (37 C) (Oral)   Resp (!) 29   SpO2 91%   Physical Exam Vitals and nursing note reviewed.  Constitutional:      General: He is in acute distress.     Appearance: Normal appearance. He is obese.  HENT:     Head: Normocephalic and atraumatic.     Right Ear:  External ear normal.     Left Ear: External ear normal.     Nose: Nose normal.     Mouth/Throat:     Mouth: Mucous membranes are moist.     Pharynx: Oropharynx is clear.  Eyes:     Extraocular Movements: Extraocular movements intact.     Conjunctiva/sclera: Conjunctivae normal.     Pupils: Pupils are equal, round, and reactive to light.  Cardiovascular:     Rate and Rhythm: Normal rate and regular rhythm.     Pulses: Normal pulses.     Heart sounds: Normal heart sounds.  Pulmonary:     Effort: Tachypnea present.  Abdominal:     General: Abdomen is flat. Bowel sounds are normal.     Palpations: Abdomen is soft.  Musculoskeletal:        General: Normal range of motion.     Cervical back: Normal range of motion and neck supple.  Skin:    General: Skin is warm.     Capillary Refill: Capillary refill takes less than 2 seconds.  Neurological:     General: No focal deficit present.     Mental Status: He is alert and oriented to person, place, and time.  Psychiatric:        Mood and Affect: Mood normal.        Behavior: Behavior normal.        Thought Content: Thought content normal.        Judgment: Judgment normal.     ED Results / Procedures / Treatments   Labs (all labs ordered are listed, but only abnormal results are displayed) Labs Reviewed  COMPREHENSIVE METABOLIC PANEL - Abnormal; Notable for the following components:      Result Value   Sodium 132 (*)    Chloride 96 (*)    CO2 18 (*)     Glucose, Bld 130 (*)    BUN 24 (*)    Albumin 3.2 (*)    AST 61 (*)    Anion gap 18 (*)    All other components within normal limits  D-DIMER, QUANTITATIVE (NOT AT [facility]) - Abnormal; Notable for the following components:   D-Dimer, Quant 4.59 (*)    All other components within normal limits  LACTATE DEHYDROGENASE - Abnormal; Notable for the following components:   LDH 524 (*)    All other components within normal limits  FERRITIN - Abnormal; Notable for the following components:   Ferritin 1,283 (*)    All other components within normal limits  FIBRINOGEN - Abnormal; Notable for the following components:   Fibrinogen 729 (*)    All other components within normal limits  C-REACTIVE PROTEIN - Abnormal; Notable for the following components:   CRP 19.5 (*)    All other components within normal limits  CULTURE, BLOOD (ROUTINE X 2)  CULTURE, BLOOD (ROUTINE X 2)  LACTIC ACID, PLASMA  CBC WITH DIFFERENTIAL/PLATELET  PROCALCITONIN  TRIGLYCERIDES    EKG EKG Interpretation  Date/Time:  Wednesday July 16 2020 13:45:48 EDT Ventricular Rate:  93 PR Interval:    QRS Duration: 87 QT Interval:  344 QTC Calculation: 428 R Axis:   33 Text Interpretation: Sinus rhythm Probable left atrial enlargement No old tracing to compare Confirmed by Isla Pence ([id number]) on 07/16/2020 2:19:44 PM   Radiology DG Chest Port 1 View  Result Date: 07/16/2020 CLINICAL DATA:  Shortness of breath. Patient reports positive COVID test. EXAM: PORTABLE CHEST 1 VIEW COMPARISON:  None. FINDINGS: The heart size  and mediastinal contours are within normal limits. Left worse than right interstitial opacities with basilar predominance. No pleural effusion or pneumothorax. The visualized skeletal structures are unremarkable. IMPRESSION: Left worse than right interstitial opacities with basilar predominance. Findings suspicious for atypical/viral infection. Electronically Signed   By: Davina Poke D.O.   On:  07/16/2020 14:27    Procedures Procedures (including critical care time)  Medications Ordered in ED Medications  0.9 %  sodium chloride infusion (1,000 mLs Intravenous New Bag/Given 07/16/20 1450)  cefTRIAXone (ROCEPHIN) 1 g in sodium chloride 0.9 % 100 mL IVPB (has no administration in time range)  azithromycin (ZITHROMAX) 500 mg in sodium chloride 0.9 % 250 mL IVPB (has no administration in time range)  remdesivir 200 mg in sodium chloride 0.9% 250 mL IVPB (has no administration in time range)    Followed by  remdesivir 100 mg in sodium chloride 0.9 % 100 mL IVPB (has no administration in time range)  dexamethasone (DECADRON) injection 10 mg (10 mg Intravenous Given 07/16/20 1414)    ED Course  I have reviewed the triage vital signs and the nursing notes.  Pertinent labs & imaging results that were available during my care of the patient were reviewed by me and considered in my medical decision making (see chart for details).    MDM Rules/Calculators/A&P                           Pt placed on 6L of oxygen.  His oxygen levels are in the mid-90s on 6L.  He is given decadron/rocephin/zithromax/remdesivir.  He is d/w Dr. Bonner Puna (triad) for admission.  Pt's wife updated per pt request.  CRITICAL CARE Performed by: Isla Pence   Total critical care time: 30 minutes  Critical care time was exclusive of separately billable procedures and treating other patients.  Critical care was necessary to treat or prevent imminent or life-threatening deterioration.  Critical care was time spent personally by me on the following activities: development of treatment plan with patient and/or surrogate as well as nursing, discussions with consultants, evaluation of patient's response to treatment, examination of patient, obtaining history from patient or surrogate, ordering and performing treatments and interventions, ordering and review of laboratory studies, ordering and review of radiographic  studies, pulse oximetry and re-evaluation of patient's condition.  Jonathan Shelton was evaluated in Emergency Department on 07/16/2020 for the symptoms described in the history of present illness. He was evaluated in the context of the global COVID-19 pandemic, which necessitated consideration that the patient might be at risk for infection with the SARS-CoV-2 virus that causes COVID-19. Institutional protocols and algorithms that pertain to the evaluation of patients at risk for COVID-19 are in a state of rapid change based on information released by regulatory bodies including the CDC and federal and state organizations. These policies and algorithms were followed during the patient's care in the ED.  Final Clinical Impression(s) / ED Diagnoses Final diagnoses:  Pneumonia due to COVID-19 virus  Acute respiratory failure with hypoxia [facility])    Rx / DC Orders ED Discharge Orders    None       Isla Pence, MD 07/16/20 1527

## 2020-07-16 NOTE — H&P (Signed)
History and Physical   Jonathan Shelton TWS:[id number] DOB: [birth year] DOA: 07/16/2020  Referring MD/NP/PA: Dr. Gilford Raid, EDP PCP: [name], DO  Patient coming from: Home  Chief Complaint: Weakness, fever, shortness of breath  HPI: Jonathan Shelton is a 69 y.o. male with a history of IDT2DM, BPH, and covid-19 diagnosed 8/24 who presented to [facility] ED 9/1 with progressively worsening shortness of breath for the past week. This was associated with fevers for which he took tylenol and ibuprofen with limited improvement, and with severe lethargy, poor per oral intake, and constantly worsening dyspnea worse with exertion, now present at rest.    ED Course: Afebrile, but hypoxemic to 82% on room air, >90% on 6L O2. CXR confirms bilateral (L>R) interstitial infiltrates consistent with covid-19 pneumonia. CRP 15.9, PCT undetectable, lactic acid 1.3, d-dimer 4.59. Hospitalists asked to admit for acute hypoxemic respiratory failure due to covid-19 pneumonia. Remdesivir, decadron started.   Review of Systems: +fever, chills, body aches, mild nonproductive cough. Denies changes in vision or hearing, chest pain, palpitations, leg swelling, orthopnea, PND, abdominal pain, nausea, vomiting, changes in bowel habits, blood in stool, change in bladder habits, rash, and per HPI. All others reviewed and are negative.   Past Medical History:  Diagnosis Date  . Allergy   . BPH (benign prostatic hyperplasia)   . Colon polyps   . Diabetes ([place])   . Diverticulosis   . Gallstones   . GERD (gastroesophageal reflux disease)   . HOH (hard of hearing)   . Kidney stones   . Renal disorder   . Wears glasses    Past Surgical History:  Procedure Laterality Date  . CARPAL TUNNEL RELEASE    . CHOLECYSTECTOMY    . COLONOSCOPY WITH PROPOFOL N/A 02/06/2020   Procedure: COLONOSCOPY WITH PROPOFOL;  Surgeon: Lin Landsman, MD;  Location: [facility] ENDOSCOPY;  Service: Gastroenterology;  Laterality: N/A;  .  COLONOSCOPY WITH PROPOFOL N/A 03/31/2020   Procedure: COLONOSCOPY WITH PROPOFOL;  Surgeon: Rush Landmark Telford Nab., MD;  Location: [place];  Service: Gastroenterology;  Laterality: N/A;  . ENDOSCOPIC MUCOSAL RESECTION N/A 03/31/2020   Procedure: ENDOSCOPIC MUCOSAL RESECTION;  Surgeon: Rush Landmark Telford Nab., MD;  Location: [place];  Service: Gastroenterology;  Laterality: N/A;  . HEMOSTASIS CLIP PLACEMENT  03/31/2020   Procedure: HEMOSTASIS CLIP PLACEMENT;  Surgeon: Irving Copas., MD;  Location: [place];  Service: Gastroenterology;;  . HERNIA REPAIR    . POLYPECTOMY  03/31/2020   Procedure: POLYPECTOMY;  Surgeon: Mansouraty, Telford Nab., MD;  Location: [place];  Service: Gastroenterology;;  . SMALL INTESTINE SURGERY    . SUBMUCOSAL LIFTING INJECTION  03/31/2020   Procedure: SUBMUCOSAL LIFTING INJECTION;  Surgeon: Rush Landmark Telford Nab., MD;  Location: [facility] ENDOSCOPY;  Service: Gastroenterology;;   - Non smoker, lives with wife and daughter (who is Therapist, sports) and 2 grandchildren. They have covid but are not severely ill. He has not been vaccinated against covid. No Hx DVT.   reports that he quit smoking about 51 years ago. His smoking use included cigarettes. He has quit using smokeless tobacco.  His smokeless tobacco use included snuff. He reports previous alcohol use. He reports previous drug use. Allergies  Allergen Reactions  . Testosterone Rash    patch   Family History  Problem Relation Age of Onset  . Heart disease Mother 81  . Heart disease Father   . Stroke Father 57  . Diabetes Father   . Heart attack Father   . Liver disease Neg Hx   .  Colon cancer Neg Hx   . Esophageal cancer Neg Hx   . Pancreatic cancer Neg Hx   . Stomach cancer Neg Hx   . Inflammatory bowel disease Neg Hx   . Rectal cancer Neg Hx    - Family history otherwise reviewed and not pertinent.  Prior to Admission medications   Medication Sig Start Date End Date Taking? Authorizing Provider   ascorbic acid (VITAMIN C) 500 MG tablet Take 500 mg by mouth daily.    [provider]  aspirin 81 MG chewable tablet Chew 81 mg by mouth daily.     [provider]  atorvastatin (LIPITOR) 10 MG tablet Take 1 tablet (10 mg total) by mouth daily at 6 PM. 03/03/20   Karamalegos, Devonne Doughty, DO  azithromycin (ZITHROMAX) 250 MG tablet Take 2 tablets on day 1, then 1 tablet daily for the following 4 days 07/10/20   Verl Bangs, FNP  b complex vitamins tablet Take 1 tablet by mouth daily.    [provider]  CRANBERRY FRUIT PO Take by mouth.    [provider]  finasteride (PROSCAR) 5 MG tablet TAKE 1 TABLET BY MOUTH EVERY DAY 05/24/20   Karamalegos, Devonne Doughty, DO  insulin NPH Human (NOVOLIN N) 100 UNIT/ML injection Inject 0.4 mLs (40 Units total) into the skin at bedtime. May use reduced dose as advised. Patient taking differently: Inject 30-45 Units into the skin at bedtime. May use reduced dose as advised. 03/14/20   Verl Bangs, FNP  JARDIANCE 25 MG TABS tablet TAKE 1 TABLET BY MOUTH EVERY DAY 04/08/20   Parks Ranger, Devonne Doughty, DO  levalbuterol [facility] HFA) 45 MCG/ACT inhaler Inhale 1-2 puffs into the lungs every 6 (six) hours as needed for wheezing. 07/10/20   [name], DO  metFORMIN (GLUCOPHAGE) 1000 MG tablet Take 1 tablet (1,000 mg total) by mouth 2 (two) times daily with a meal. 03/03/20   Karamalegos, Devonne Doughty, DO  mupirocin ointment (BACTROBAN) 2 % Place 1 application into the nose 2 (two) times daily. 03/26/20   Ralene Bathe, MD  OZEMPIC, 1 MG/DOSE, 2 MG/1.5ML SOPN Inject 1 mg into the skin once a week. 03/11/20   Karamalegos, Devonne Doughty, DO  Probiotic Product (ALIGN) 4 MG CAPS Take 2 each by mouth daily.     [provider]  tamsulosin (FLOMAX) 0.4 MG CAPS capsule Take 1 capsule (0.4 mg total) by mouth daily after supper. 03/03/20   Karamalegos, Devonne Doughty, DO  valsartan (DIOVAN) 80 MG tablet Take 1 tablet (80 mg  total) by mouth daily. 04/01/20   Karamalegos, Devonne Doughty, DO  vitamin E 400 UNIT capsule Take 400 Units by mouth daily.    [provider]  albuterol (VENTOLIN HFA) 108 (90 Base) MCG/ACT inhaler Inhale 1-2 puffs into the lungs every 4 (four) hours as needed for wheezing or shortness of breath. 07/10/20 07/10/20  Verl Bangs, FNP    Physical Exam: Vitals:   07/16/20 1430 07/16/20 1445 07/16/20 1500 07/16/20 1530  BP: 112/65  113/68 130/75  Pulse: 91 90 90 90  Resp: (!) 27 (!) 29 (!) 27 (!) 25  Temp:      TempSrc:      SpO2: 91% 91% 91% 96%   Constitutional: 69 y.o. male in no distress, calm demeanor, tired-appearing Eyes: Lids and conjunctivae normal, PERRL ENMT: Mucous membranes are moist. Posterior pharynx clear of any exudate or lesions. Fair dentition.  Neck: Supple, no masses, no thyromegaly Respiratory: Tachypneic  with bilateral crackles. Cardiovascular: Regular rate and rhythm, no murmurs, rubs, or gallops. No carotid bruits. No JVD. No pitting LE edema. Diminished symmetrically pedal pulses. Abdomen: Normoactive bowel sounds. No tenderness, non-distended, and no masses palpated. No hepatosplenomegaly. GU: No indwelling catheter Musculoskeletal: No clubbing / cyanosis. No joint deformity upper and lower extremities. Good ROM, no contractures. Normal muscle tone.  Skin: Warm, dry. No rashes, wounds, or ulcers. Mild hyperpigmentation to lower legs bilaterally with varicosities. Neurologic: CN II-XII grossly intact. Speech normal, low volume. Appears diffusely weak with no focal deficits in motor strength or sensation in all extremities.  Psychiatric: Alert and oriented x3. Normal judgment and insight. Mood anxious with congruent affect.   Labs on Admission: I have personally reviewed following labs and imaging studies  CBC: Recent Labs  Lab 07/16/20 1358  WBC 6.5  NEUTROABS PENDING  HGB 16.0  HCT 47.6  MCV 88.0  PLT 852   Basic Metabolic Panel: Recent Labs   Lab 07/16/20 1358  NA 132*  K 4.1  CL 96*  CO2 18*  GLUCOSE 130*  BUN 24*  CREATININE 0.97  CALCIUM 9.5   GFR: CrCl cannot be calculated (Unknown ideal weight.). Liver Function Tests: Recent Labs  Lab 07/16/20 1358  AST 61*  ALT 38  ALKPHOS 51  BILITOT 1.2  PROT 7.9  ALBUMIN 3.2*   No results for input(s): LIPASE, AMYLASE in the last 168 hours. No results for input(s): AMMONIA in the last 168 hours. Coagulation Profile: No results for input(s): INR, PROTIME in the last 168 hours. Cardiac Enzymes: No results for input(s): CKTOTAL, CKMB, CKMBINDEX, TROPONINI in the last 168 hours. BNP (last 3 results) No results for input(s): PROBNP in the last 8760 hours. HbA1C: No results for input(s): HGBA1C in the last 72 hours. CBG: No results for input(s): GLUCAP in the last 168 hours. Lipid Profile: Recent Labs    07/16/20 1349  TRIG 119   Thyroid Function Tests: No results for input(s): TSH, T4TOTAL, FREET4, T3FREE, THYROIDAB in the last 72 hours. Anemia Panel: Recent Labs    07/16/20 1349  FERRITIN 1,283*   Urine analysis:    Component Value Date/Time   COLORURINE YELLOW 07/03/2019 0908   APPEARANCEUR CLEAR 07/03/2019 0908   LABSPEC 1.031 07/03/2019 0908   PHURINE < OR = 5.0 07/03/2019 0908   GLUCOSEU 3+ (A) 07/03/2019 0908   HGBUR NEGATIVE 07/03/2019 0908   BILIRUBINUR negative 02/11/2020 1458   KETONESUR NEGATIVE 07/03/2019 0908   PROTEINUR Positive (A) 02/11/2020 1458   PROTEINUR NEGATIVE 07/03/2019 0908   UROBILINOGEN 0.2 02/11/2020 1458   NITRITE negative 02/11/2020 1458   NITRITE NEGATIVE 07/03/2019 0908   LEUKOCYTESUR Negative 02/11/2020 1458   LEUKOCYTESUR NEGATIVE 07/03/2019 0908   Radiological Exams on Admission: DG Chest Port 1 View  Result Date: 07/16/2020 CLINICAL DATA:  Shortness of breath. Patient reports positive COVID test. EXAM: PORTABLE CHEST 1 VIEW COMPARISON:  None. FINDINGS: The heart size and mediastinal contours are within normal  limits. Left worse than right interstitial opacities with basilar predominance. No pleural effusion or pneumothorax. The visualized skeletal structures are unremarkable. IMPRESSION: Left worse than right interstitial opacities with basilar predominance. Findings suspicious for atypical/viral infection. Electronically Signed   By: Davina Poke D.O.   On: 07/16/2020 14:27   On my personal review, has diffuse left > right opacity without focal consolidation.   EKG: Independently reviewed. NSR with p-waves in lead II suggestive of LAE, normal axis and intervals, no ST segment deviation.  Assessment/Plan  Active Problems:   Acute respiratory disease due to COVID-19 virus   Acute hypoxemic respiratory failure due to covid-19 pneumonia: SARS-CoV-2 PCR positive on 8/24, symptoms began 8/22. High risk features: IDDM, obesity, non-vaccinated status.  - Note PCT negative. Stop abx. - Remdesivir x5 days (9/1 - 9/5) - Augment decadron to methylprednisolone with CRP nearly 20. - Discussed use of baricitinib OR tocilizumab with the patient and with his wife by phone. The patient has no ESRD or AKI, known history of TB, severe neutropenia (ANC <500) or lymphopenia (ALC <200), or severe LFT elevations. They are not on DMARDs. The option to use/refuse baricitinib treatment under FDA authorization (not approval), the significant known and potential risks and benefits, the extent to which these are unknown, and information regarding all available alternatives were discussed in detail. They consent to proceed with treatment if hypoxemia progresses above 6LPM. - Encourage OOB, IS, FV, and awake proning if able - Continue airborne, contact precautions for 21 days from positive testing. Encouraged to get vaccine after isolation period. - Enoxaparin intermediate dose.   AST elevation: Likely due to viral infection. - Monitor  IDT2DM:  - Give levemir 10u qHS (lowest home dose is 20u) with moderate SSI due to concern  for hypoglycemia with poor per oral intake. Monitor closely for steroid-induced hyperglycemia and titrate as indicated.  - Linagliptin added per protocol. Hold home medications. - Continue ARB for renoprotective effect - Continue ASA, statin. Meds confirmed with patient at admission.  Anion gap metabolic acidosis, suspected starvation ketosis:  - Encourage po intake. Received IVF, will limit IVF resuscitation in this setting.  - Stop jardiance.  DVT prophylaxis: Lovenox 0.5mg /kg q12h planned once patient's weight is known.  Code Status: Full, confirmed at admission  Family Communication: Wife by phone Disposition Plan: Uncertain Consults called: None  Admission status: Inpatient    Patrecia Pour, MD [facility] [email or web address] 07/16/2020, 4:09 PM

## 2020-07-16 NOTE — ED Triage Notes (Signed)
Pt Covid+ for about week. Pt SOB. Had unresponsive episode for about 5 seconds when standing from wheelchair to weight chair in triage. Pt had to sit back down in wheelchair.  Pt O2 82% on room air.

## 2020-07-16 NOTE — ED Notes (Signed)
EKG given to Dr Haviland. 

## 2020-07-17 ENCOUNTER — Inpatient Hospital Stay (HOSPITAL_COMMUNITY): Payer: Medicare HMO

## 2020-07-17 LAB — COMPREHENSIVE METABOLIC PANEL
ALT: 34 U/L (ref 0–44)
AST: 46 U/L — ABNORMAL HIGH (ref 15–41)
Albumin: 2.6 g/dL — ABNORMAL LOW (ref 3.5–5.0)
Alkaline Phosphatase: 47 U/L (ref 38–126)
Anion gap: 15 (ref 5–15)
BUN: 30 mg/dL — ABNORMAL HIGH (ref 8–23)
CO2: 16 mmol/L — ABNORMAL LOW (ref 22–32)
Calcium: 8.4 mg/dL — ABNORMAL LOW (ref 8.9–10.3)
Chloride: 103 mmol/L (ref 98–111)
Creatinine, Ser: 0.77 mg/dL (ref 0.61–1.24)
GFR calc Af Amer: >60 mL/min (ref >60)
GFR calc non Af Amer: >60 mL/min (ref >60)
Glucose, Bld: 210 mg/dL — ABNORMAL HIGH (ref 70–99)
Potassium: 4.3 mmol/L (ref 3.5–5.1)
Sodium: 134 mmol/L — ABNORMAL LOW (ref 135–145)
Total Bilirubin: 0.8 mg/dL (ref 0.3–1.2)
Total Protein: 6.5 g/dL (ref 6.5–8.1)

## 2020-07-17 LAB — CBG MONITORING, ED
Glucose-Capillary: 188 mg/dL — ABNORMAL HIGH (ref 70–99)
Glucose-Capillary: 237 mg/dL — ABNORMAL HIGH (ref 70–99)
Glucose-Capillary: 294 mg/dL — ABNORMAL HIGH (ref 70–99)

## 2020-07-17 LAB — CBC WITH DIFFERENTIAL/PLATELET
Abs Immature Granulocytes: 0.06 10*3/uL (ref 0.00–0.07)
Basophils Absolute: 0 10*3/uL (ref 0.0–0.1)
Basophils Relative: 0 %
Eosinophils Absolute: 0 10*3/uL (ref 0.0–0.5)
Eosinophils Relative: 0 %
HCT: 41.7 % (ref 39.0–52.0)
Hemoglobin: 14.2 g/dL (ref 13.0–17.0)
Immature Granulocytes: 2 %
Lymphocytes Relative: 13 %
Lymphs Abs: 0.5 10*3/uL — ABNORMAL LOW (ref 0.7–4.0)
MCH: 30.5 pg (ref 26.0–34.0)
MCHC: 34.1 g/dL (ref 30.0–36.0)
MCV: 89.5 fL (ref 80.0–100.0)
Monocytes Absolute: 0.3 10*3/uL (ref 0.1–1.0)
Monocytes Relative: 6 %
Neutro Abs: 3.2 10*3/uL (ref 1.7–7.7)
Neutrophils Relative %: 79 %
Platelets: 193 10*3/uL (ref 150–400)
RBC: 4.66 MIL/uL (ref 4.22–5.81)
RDW: 14.7 % (ref 11.5–15.5)
WBC: 4.1 10*3/uL (ref 4.0–10.5)
nRBC: 0 % (ref 0.0–0.2)

## 2020-07-17 LAB — C-REACTIVE PROTEIN: CRP: 15.1 mg/dL — ABNORMAL HIGH (ref <1.0)

## 2020-07-17 LAB — D-DIMER, QUANTITATIVE: D-Dimer, Quant: 4.26 ug/mL-FEU — ABNORMAL HIGH (ref 0.00–0.50)

## 2020-07-17 LAB — GLUCOSE, CAPILLARY: Glucose-Capillary: 276 mg/dL — ABNORMAL HIGH (ref 70–99)

## 2020-07-17 LAB — ABO/RH: ABO/RH(D): O POS

## 2020-07-17 LAB — HIV ANTIBODY (ROUTINE TESTING W REFLEX): HIV Screen 4th Generation wRfx: NONREACTIVE

## 2020-07-17 LAB — HEMOGLOBIN A1C
Hgb A1c MFr Bld: 7.6 % — ABNORMAL HIGH (ref 4.8–5.6)
Mean Plasma Glucose: 171.42 mg/dL

## 2020-07-17 MED ORDER — BARICITINIB 2 MG PO TABS
4.0000 mg | ORAL_TABLET | Freq: Every day | ORAL | Status: DC
  Administered 2020-07-17 – 2020-07-23 (×7): 4 mg via ORAL
  Filled 2020-07-17 (×7): qty 2

## 2020-07-17 MED ORDER — DIPHENHYDRAMINE HCL 25 MG PO CAPS
25.0000 mg | ORAL_CAPSULE | Freq: Every evening | ORAL | Status: DC | PRN
  Filled 2020-07-17: qty 1

## 2020-07-17 MED ORDER — IOHEXOL 350 MG/ML SOLN
75.0000 mL | Freq: Once | INTRAVENOUS | Status: AC | PRN
  Administered 2020-07-17: 75 mL via INTRAVENOUS

## 2020-07-17 NOTE — Progress Notes (Signed)
Occupational Therapy Evaluation  Patient with functional deficits listed below impacting safety with self care. Patient not requiring physical assistance with functional transfers and ambulation in room however patient with decreased activity tolerance and requiring 15L HFNC to maintain 89-90% O2 with activity. Recommend continued acute OT services to maximize patient endurance and education regarding energy conservation strategies in order to maximize safety with daily routine at home.    07/17/20 1513  OT Visit Information  Last OT Received On 07/17/20  Assistance Needed +1  History of Present Illness Patient is a 69 year old male history of insulin-dependent type 2 diabetes, BPH, diagnosed with COVID-19 on 8/24 presented to emergency room on 9/1 with progressively worsening shortness of breath  Precautions  Precaution Comments monitor vitals  Restrictions  Weight Bearing Restrictions No  Home Living  Family/patient expects to be discharged to: Private residence  Living Arrangements Spouse/significant other;Other relatives  Available Help at Discharge Family;Available 24 hours/day  Type of Home House  Home Access Level entry  Home Layout Two level;Able to live on main level with bedroom/bathroom  Alternate Level Stairs-Number of Steps 10  Alternate Level Stairs-Rails Right  Print production planner  (elevated "a little bit")  Home Equipment Shower seat;Grab bars - toilet;Grab bars - tub/shower  Additional Comments DTR and grandchildren currently living with patient and spouse  Prior Function  Level of Independence Independent  Communication  Communication No difficulties  Pain Assessment  Pain Assessment No/denies pain  Cognition  Arousal/Alertness Awake/alert  Behavior During Therapy WFL for tasks assessed/performed  Overall Cognitive Status Within Functional Limits for tasks assessed  Upper Extremity Assessment  Upper Extremity Assessment Overall  WFL for tasks assessed  Lower Extremity Assessment  Lower Extremity Assessment Defer to PT evaluation  Cervical / Trunk Assessment  Cervical / Trunk Assessment Normal  ADL  Overall ADL's  Modified independent  General ADL Comments patient reports has been using bedside commode without physical assistance, patient transfer to bedside commode with OT present with min cue for problem solving to not sit on multiple lines  Bed Mobility  General bed mobility comments seated EOB  Transfers  Overall transfer level Modified independent  General transfer comment no physical assistance required   Balance  Overall balance assessment Mild deficits observed, not formally tested  General Comments  General comments (skin integrity, edema, etc.) patient on 15L HFNC throughout session, patient reading 89-90% after approx 28ft of ambulation in room  OT - End of Session  Equipment Utilized During Treatment Oxygen  Activity Tolerance Patient limited by fatigue  Patient left with call bell/phone within reach (on bedside commode)  Nurse Communication Mobility status  OT Assessment  OT Recommendation/Assessment Patient needs continued OT Services  OT Visit Diagnosis Other abnormalities of gait and mobility (R26.89)  OT Problem List Decreased activity tolerance;Cardiopulmonary status limiting activity  OT Plan  OT Frequency (ACUTE ONLY) Min 2X/week  OT Treatment/Interventions (ACUTE ONLY) Energy conservation;DME and/or AE instruction;Therapeutic activities;Patient/family education  AM-PAC OT "6 Clicks" Daily Activity Outcome Measure (Version 2)  Help from another person eating meals? 4  Help from another person taking care of personal grooming? 4  Help from another person toileting, which includes using toliet, bedpan, or urinal? 4  Help from another person bathing (including washing, rinsing, drying)? 4  Help from another person to put on and taking off regular upper body clothing? 4  Help from another  person to put on and taking off regular lower body clothing? 4  6 Click Score 24  OT Recommendation  Follow Up Recommendations No OT follow up  OT Equipment None recommended by OT  Individuals Consulted  Consulted and Agree with Results and Recommendations Patient  Acute Rehab OT Goals  Patient Stated Goal feel better  OT Goal Formulation With patient  Time For Goal Achievement 07/31/20  Potential to Achieve Goals Good  OT Time Calculation  OT Start Time (ACUTE ONLY) 1302  OT Stop Time (ACUTE ONLY) 1337  OT Time Calculation (min) 35 min  OT General Charges  $OT Visit 1 Visit  OT Evaluation  $OT Eval Moderate Complexity 1 Mod  Written Expression  Dominant Hand Right   Delbert Phenix OT OT pager: [phone]

## 2020-07-17 NOTE — Progress Notes (Signed)
PROGRESS NOTE    Jonathan Shelton  MGQ:[id number] DOB: 12/06/1950 DOA: 07/16/2020 PCP: [name], DO    Brief Narrative:  69 year old gentleman with history of insulin-dependent type 2 diabetes, BPH, diagnosed with COVID-19 on 8/24 presented to emergency room on 9/1 with progressively worsening shortness of breath despite conservative management at home.  Lethargic and poor oral intake. In the emergency room, afebrile.  82% on room air.  Needed 6 L of oxygen.  Bilateral infiltrates left more than right.  CRP 15.9.  Procalcitonin normal.  D-dimer 4.59.   Assessment & Plan:   Active Problems:   Type 2 diabetes mellitus with other specified complication (HCC)   Benign prostatic hyperplasia with incomplete bladder emptying   Obesity (BMI 30.0-34.9)   Acute respiratory disease due to COVID-19 virus  Acute hypoxemic respiratory failure due to COVID-19 pneumonia: Continue to monitor due to significant symptoms  chest physiotherapy, incentive spirometry, deep breathing exercises, sputum induction, mucolytic's and bronchodilators. Supplemental oxygen to keep saturations more than 90%. Covid directed therapy with , steroids, high-dose IV Solu-Medrol remdesivir, day 2/5 Baricitinib, day 1/14 or until hospital discharge convalescent plasma, not indicated antibiotics, not indicated Due to severity of symptoms, patient will need daily inflammatory markers, chest x-rays, liver function test to monitor and direct COVID-19 therapies. CT angiogram was done that is negative for pulmonary embolism, shows extensive bilateral infiltrates.  COVID-19 Labs  Recent Labs    07/16/20 1349 07/16/20 1358 07/17/20 0539  DDIMER  --  4.59* 4.26*  FERRITIN 1,283*  --   --   LDH  --  524*  --   CRP 19.5*  --  15.1*    Lab Results  Component Value Date   SARSCOV2NAA NEGATIVE 03/27/2020   [place] NEGATIVE 02/04/2020   [place] Not Detected 01/15/2020   [place] Not Detected  10/25/2019   SpO2: 90 % O2 Flow Rate (L/min): 15 L/min FiO2 (%): 100 %  Type 2 diabetes on insulin at home: On increased dose of insulin with added to steroids.  We will continue to titrate.  Hyperlipidemia: On a statin.  DVT prophylaxis: Lovenox subcu   Code Status: Full code Family Communication: None.  Patient is communicating with family. Disposition Plan: Status is: Inpatient  Remains inpatient appropriate because:Inpatient level of care appropriate due to severity of illness   Dispo: The patient is from: Home              Anticipated d/c is to: Home              Anticipated d/c date is: > 3 days              Patient currently is not medically stable to d/c.         Consultants:   None  Procedures:   None  Antimicrobials:  Anti-infectives (From admission, onward)   Start     Dose/Rate Route Frequency Ordered Stop   07/17/20 1000  remdesivir 100 mg in sodium chloride 0.9 % 100 mL IVPB       "Followed by" Linked Group Details   100 mg 200 mL/hr over 30 Minutes Intravenous Daily 07/16/20 1444 07/21/20 0959   07/16/20 1500  remdesivir 200 mg in sodium chloride 0.9% 250 mL IVPB       "Followed by" Linked Group Details   200 mg 580 mL/hr over 30 Minutes Intravenous Once 07/16/20 1444 07/16/20 1613   07/16/20 1445  cefTRIAXone (ROCEPHIN) 1 g in sodium chloride 0.9 % 100 mL  IVPB  Status:  Discontinued        1 g 200 mL/hr over 30 Minutes Intravenous  Once 07/16/20 1438 07/16/20 1531   07/16/20 1445  azithromycin (ZITHROMAX) 500 mg in sodium chloride 0.9 % 250 mL IVPB  Status:  Discontinued        500 mg 250 mL/hr over 60 Minutes Intravenous  Once 07/16/20 1438 07/16/20 1531         Subjective: Patient was seen and examined.  He still in the emergency room waiting for inpatient bed availability.  He is on increasing dose of oxygen overnight.  Denies any chest pain.  Has not ambulated yet.  Feels about the same as yesterday.  Afebrile  overnight.  Objective: Vitals:   07/17/20 1253 07/17/20 1300 07/17/20 1349 07/17/20 1355  BP: (!) 142/78 130/81  130/81  Pulse: 98 99 99 99  Resp: 18 (!) 24 (!) 25 (!) 25  Temp:    98.9 F (37.2 C)  TempSrc:    Oral  SpO2: 90% (!) 89% (!) 89% 90%  Weight:      Height:        Intake/Output Summary (Last 24 hours) at 07/17/2020 1408 Last data filed at 07/16/2020 1613 Gross per 24 hour  Intake 257.6 ml  Output --  Net 257.6 ml   Filed Weights   07/16/20 1644  Weight: 108 kg    Examination:  General exam: Appears in mild respiratory distress, on 15 L of oxygen.  Able to talk in full sentences. Respiratory system: Clear to auscultation. Respiratory effort normal.  No added sounds. Cardiovascular system: S1 & S2 heard, RRR. No JVD, murmurs, rubs, gallops or clicks. No pedal edema. Gastrointestinal system: Abdomen is nondistended, soft and nontender. No organomegaly or masses felt. Normal bowel sounds heard. Central nervous system: Alert and oriented. No focal neurological deficits. Extremities: Symmetric 5 x 5 power. Skin: No rashes, lesions or ulcers Psychiatry: Judgement and insight appear normal. Mood & affect appropriate.     Data Reviewed: I have personally reviewed following labs and imaging studies  CBC: Recent Labs  Lab 07/16/20 1358 07/17/20 0539  WBC 6.5 4.1  NEUTROABS 5.2 3.2  HGB 16.0 14.2  HCT 47.6 41.7  MCV 88.0 89.5  PLT 200 578   Basic Metabolic Panel: Recent Labs  Lab 07/16/20 1358 07/17/20 0539  NA 132* 134*  K 4.1 4.3  CL 96* 103  CO2 18* 16*  GLUCOSE 130* 210*  BUN 24* 30*  CREATININE 0.97 0.77  CALCIUM 9.5 8.4*   GFR: Estimated Creatinine Clearance: 108.8 mL/min (by C-G formula based on SCr of 0.77 mg/dL). Liver Function Tests: Recent Labs  Lab 07/16/20 1358 07/17/20 0539  AST 61* 46*  ALT 38 34  ALKPHOS 51 47  BILITOT 1.2 0.8  PROT 7.9 6.5  ALBUMIN 3.2* 2.6*   No results for input(s): LIPASE, AMYLASE in the last 168  hours. No results for input(s): AMMONIA in the last 168 hours. Coagulation Profile: No results for input(s): INR, PROTIME in the last 168 hours. Cardiac Enzymes: No results for input(s): CKTOTAL, CKMB, CKMBINDEX, TROPONINI in the last 168 hours. BNP (last 3 results) No results for input(s): PROBNP in the last 8760 hours. HbA1C: Recent Labs    07/17/20 0539  HGBA1C 7.6*   CBG: Recent Labs  Lab 07/16/20 1825 07/16/20 2207 07/17/20 0906 07/17/20 1205  GLUCAP 150* 215* 188* 237*   Lipid Profile: Recent Labs    07/16/20 1349  TRIG 119  Thyroid Function Tests: No results for input(s): TSH, T4TOTAL, FREET4, T3FREE, THYROIDAB in the last 72 hours. Anemia Panel: Recent Labs    07/16/20 1349  FERRITIN 1,283*   Sepsis Labs: Recent Labs  Lab 07/16/20 1358  PROCALCITON <0.10  LATICACIDVEN 1.3    Recent Results (from the past 240 hour(s))  Blood Culture (routine x 2)     Status: None (Preliminary result)   Collection Time: 07/16/20  1:58 PM   Specimen: BLOOD RIGHT FOREARM  Result Value Ref Range Status   Specimen Description   Final    BLOOD RIGHT FOREARM Performed at [facility] [address] [place], [place] [address]    Special Requests   Final    BOTTLES DRAWN AEROBIC AND ANAEROBIC Blood Culture adequate volume Performed at [facility] [address] [place], [place] [address]    Culture   Final    NO GROWTH < 24 HOURS Performed at [facility] [address], [place], [place] [address]    Report Status PENDING  Incomplete  Blood Culture (routine x 2)     Status: None (Preliminary result)   Collection Time: 07/16/20  1:58 PM   Specimen: BLOOD  Result Value Ref Range Status   Specimen Description   Final    BLOOD RIGHT ANTECUBITAL Performed at [facility] [address], [place], [place] [address]    Special Requests   Final    BOTTLES DRAWN AEROBIC AND ANAEROBIC Blood Culture  adequate volume Performed at [facility] [address], [place], [place] [address]    Culture   Final    NO GROWTH < 24 HOURS Performed at [facility] [address] [place], [place] [address]    Report Status PENDING  Incomplete         Radiology Studies: CT ANGIO CHEST PE W OR WO CONTRAST  Result Date: 07/17/2020 CLINICAL DATA:  COVID-19 viral infection. Increased oxygen demand and elevated D-dimer. Clinical suspicion for pulmonary embolism. EXAM: CT ANGIOGRAPHY CHEST WITH CONTRAST TECHNIQUE: Multidetector CT imaging of the chest was performed using the standard protocol during bolus administration of intravenous contrast. Multiplanar CT image reconstructions and MIPs were obtained to evaluate the vascular anatomy. CONTRAST:  21mL OMNIPAQUE IOHEXOL 350 MG/ML SOLN COMPARISON:  None. FINDINGS: Cardiovascular: Satisfactory opacification of pulmonary arteries noted, and no pulmonary emboli identified. No evidence of thoracic aortic dissection or aneurysm. Mediastinum/Nodes: No masses or pathologically enlarged lymph nodes identified. Lungs/Pleura: Bilateral diffuse pulmonary airspace disease is seen, with greatest involvement of the lower lobes. No evidence of pleural effusion. Upper abdomen: No acute findings. Musculoskeletal: No suspicious bone lesions identified. Review of the MIP images confirms the above findings. IMPRESSION: No evidence of pulmonary embolism. Bilateral diffuse pulmonary airspace disease with lower lobe predominance, consistent with viral pneumonia. Electronically Signed   By: Marlaine Hind M.D.   On: 07/17/2020 11:36   DG Chest Port 1 View  Result Date: 07/16/2020 CLINICAL DATA:  Shortness of breath. Patient reports positive COVID test. EXAM: PORTABLE CHEST 1 VIEW COMPARISON:  None. FINDINGS: The heart size and mediastinal contours are within normal limits. Left worse than right interstitial opacities with basilar predominance. No pleural effusion  or pneumothorax. The visualized skeletal structures are unremarkable. IMPRESSION: Left worse than right interstitial opacities with basilar predominance. Findings suspicious for atypical/viral infection. Electronically Signed   By: Davina Poke D.O.   On: 07/16/2020 14:27  Scheduled Meds: . vitamin C  500 mg Oral Daily  . aspirin  81 mg Oral Daily  . atorvastatin  10 mg Oral q1800  . B-complex with vitamin C  1 tablet Oral Daily  . baricitinib  4 mg Oral Daily  . enoxaparin (LOVENOX) injection  0.5 mg/kg Subcutaneous Q12H  . finasteride  5 mg Oral Daily  . insulin aspart  0-15 Units Subcutaneous TID WC  . insulin aspart  0-5 Units Subcutaneous QHS  . insulin detemir  10 Units Subcutaneous QHS  . irbesartan  75 mg Oral Daily  . linagliptin  5 mg Oral Daily  . methylPREDNISolone (SOLU-MEDROL) injection  80 mg Intravenous Q12H  . sodium chloride flush  3 mL Intravenous Q12H  . tamsulosin  0.4 mg Oral QPC supper  . vitamin E  400 Units Oral Daily  . zinc sulfate  220 mg Oral Daily   Continuous Infusions: . remdesivir 100 mg in NS 100 mL 100 mg (07/17/20 0944)     LOS: 1 day    Time spent: 30 minutes    Barb Merino, MD [facility] Pager [phone]

## 2020-07-17 NOTE — Plan of Care (Signed)

## 2020-07-17 NOTE — Evaluation (Signed)
Physical Therapy Evaluation Patient Details Name: Jonathan Shelton MRN: [id number] DOB: 1951-08-26 Today's Date: 07/17/2020   History of Present Illness  Patient is a 69 year old male history of insulin-dependent type 2 diabetes, BPH, diagnosed with COVID-19 on 8/24 presented to emergency room on 9/1 with progressively worsening shortness of breath  Clinical Impression  The patient is mobilizing with min guard in room, on 15 L HFNC, SPO2 > 90%. Patient independent PTA. Pt admitted with above diagnosis.  Pt currently with functional limitations due to the deficits listed below (see PT Problem List). Pt will benefit from skilled PT to increase their independence and safety with mobility to allow discharge to the venue listed below.       Follow Up Recommendations Home health PT    Equipment Recommendations  None recommended by PT    Recommendations for Other Services       Precautions / Restrictions Precautions Precaution Comments: monitor vitals Restrictions Weight Bearing Restrictions: No      Mobility  Bed Mobility               General bed mobility comments: seated EOB  Transfers Overall transfer level: Modified independent               General transfer comment: no physical assistance required   Ambulation/Gait Ambulation/Gait assistance: Min guard Gait Distance (Feet): 20 Feet Assistive device: None Gait Pattern/deviations: Step-to pattern;Step-through pattern     General Gait Details: gait slow  Stairs            Wheelchair Mobility    Modified Rankin (Stroke Patients Only)       Balance Overall balance assessment: Mild deficits observed, not formally tested                                           Pertinent Vitals/Pain Pain Assessment: No/denies pain    Home Living Family/patient expects to be discharged to:: Private residence Living Arrangements: Spouse/significant other;Other relatives Available Help at  Discharge: Family;Available 24 hours/day Type of Home: House Home Access: Level entry     Home Layout: Two level;Able to live on main level with bedroom/bathroom Home Equipment: Shower seat;Grab bars - toilet;Grab bars - tub/shower Additional Comments: DTR and grandchildren currently living with patient and spouse    Prior Function Level of Independence: Independent               Hand Dominance   Dominant Hand: Right    Extremity/Trunk Assessment   Upper Extremity Assessment Upper Extremity Assessment: Generalized weakness    Lower Extremity Assessment Lower Extremity Assessment: Generalized weakness    Cervical / Trunk Assessment Cervical / Trunk Assessment: Normal  Communication   Communication: No difficulties  Cognition Arousal/Alertness: Awake/alert Behavior During Therapy: WFL for tasks assessed/performed Overall Cognitive Status: Within Functional Limits for tasks assessed                                        General Comments General comments (skin integrity, edema, etc.): patient on 15L HHNC throughout session, patient reading 89-90% after approx 70ft of ambulation in room    Exercises     Assessment/Plan    PT Assessment Patient needs continued PT services  PT Problem List Decreased strength;Decreased activity tolerance;Decreased mobility;Cardiopulmonary status limiting activity  PT Treatment Interventions DME instruction;Gait training;Stair training;Functional mobility training;Therapeutic activities;Therapeutic exercise;Patient/family education    PT Goals (Current goals can be found in the Care Plan section)  Acute Rehab PT Goals Patient Stated Goal: feel better PT Goal Formulation: With patient Time For Goal Achievement: 07/31/20 Potential to Achieve Goals: Good    Frequency Min 3X/week   Barriers to discharge        Co-evaluation               AM-PAC PT "6 Clicks" Mobility  Outcome Measure Help needed  turning from your back to your side while in a flat bed without using bedrails?: None Help needed moving from lying on your back to sitting on the side of a flat bed without using bedrails?: None Help needed moving to and from a bed to a chair (including a wheelchair)?: A Little Help needed standing up from a chair using your arms (e.g., wheelchair or bedside chair)?: A Little Help needed to walk in hospital room?: A Little Help needed climbing 3-5 steps with a railing? : A Lot 6 Click Score: 19    End of Session Equipment Utilized During Treatment: Oxygen Activity Tolerance: Patient tolerated treatment well Patient left: in bed Nurse Communication: Mobility status PT Visit Diagnosis: Unsteadiness on feet (R26.81);Difficulty in walking, not elsewhere classified (R26.2)    Time: 3710-6269 PT Time Calculation (min) (ACUTE ONLY): 25 min   Charges:   PT Evaluation $PT Eval Low Complexity: [facility] Acute Rehabilitation Services Pager [phone] Office [phone]   Claretha Cooper 07/17/2020, 4:04 PM

## 2020-07-17 NOTE — ED Notes (Signed)
Pt sitting up on side of bed to urinate with assistance. Prior to standing, pt 86 to 88% on [place] at 6L. Upon sitting on side of bed, pt tachypneic and saturations dropped to 82 to  84%. Respiratory notified for high flow. Pt currently on NRB at 10L until respiratory arrives. Current saturations 88 to 90% on 10L

## 2020-07-18 LAB — COMPREHENSIVE METABOLIC PANEL
ALT: 36 U/L (ref 0–44)
AST: 40 U/L (ref 15–41)
Albumin: 2.6 g/dL — ABNORMAL LOW (ref 3.5–5.0)
Alkaline Phosphatase: 46 U/L (ref 38–126)
Anion gap: 12 (ref 5–15)
BUN: 30 mg/dL — ABNORMAL HIGH (ref 8–23)
CO2: 19 mmol/L — ABNORMAL LOW (ref 22–32)
Calcium: 8.2 mg/dL — ABNORMAL LOW (ref 8.9–10.3)
Chloride: 104 mmol/L (ref 98–111)
Creatinine, Ser: 0.8 mg/dL (ref 0.61–1.24)
GFR calc Af Amer: >60 mL/min (ref >60)
GFR calc non Af Amer: >60 mL/min (ref >60)
Glucose, Bld: 276 mg/dL — ABNORMAL HIGH (ref 70–99)
Potassium: 4.7 mmol/L (ref 3.5–5.1)
Sodium: 135 mmol/L (ref 135–145)
Total Bilirubin: 0.8 mg/dL (ref 0.3–1.2)
Total Protein: 6.2 g/dL — ABNORMAL LOW (ref 6.5–8.1)

## 2020-07-18 LAB — CBC WITH DIFFERENTIAL/PLATELET
Abs Immature Granulocytes: 0.09 10*3/uL — ABNORMAL HIGH (ref 0.00–0.07)
Basophils Absolute: 0 10*3/uL (ref 0.0–0.1)
Basophils Relative: 0 %
Eosinophils Absolute: 0 10*3/uL (ref 0.0–0.5)
Eosinophils Relative: 0 %
HCT: 43.2 % (ref 39.0–52.0)
Hemoglobin: 14.6 g/dL (ref 13.0–17.0)
Immature Granulocytes: 1 %
Lymphocytes Relative: 18 %
Lymphs Abs: 1.2 10*3/uL (ref 0.7–4.0)
MCH: 30.1 pg (ref 26.0–34.0)
MCHC: 33.8 g/dL (ref 30.0–36.0)
MCV: 89.1 fL (ref 80.0–100.0)
Monocytes Absolute: 0.5 10*3/uL (ref 0.1–1.0)
Monocytes Relative: 7 %
Neutro Abs: 4.8 10*3/uL (ref 1.7–7.7)
Neutrophils Relative %: 74 %
Platelets: 235 10*3/uL (ref 150–400)
RBC: 4.85 MIL/uL (ref 4.22–5.81)
RDW: 14.6 % (ref 11.5–15.5)
WBC: 6.6 10*3/uL (ref 4.0–10.5)
nRBC: 0 % (ref 0.0–0.2)

## 2020-07-18 LAB — C-REACTIVE PROTEIN: CRP: 7.9 mg/dL — ABNORMAL HIGH (ref <1.0)

## 2020-07-18 LAB — D-DIMER, QUANTITATIVE: D-Dimer, Quant: 3.57 ug/mL-FEU — ABNORMAL HIGH (ref 0.00–0.50)

## 2020-07-18 LAB — GLUCOSE, CAPILLARY
Glucose-Capillary: 270 mg/dL — ABNORMAL HIGH (ref 70–99)
Glucose-Capillary: 283 mg/dL — ABNORMAL HIGH (ref 70–99)
Glucose-Capillary: 357 mg/dL — ABNORMAL HIGH (ref 70–99)
Glucose-Capillary: 415 mg/dL — ABNORMAL HIGH (ref 70–99)

## 2020-07-18 MED ORDER — DOCUSATE SODIUM 100 MG PO CAPS
100.0000 mg | ORAL_CAPSULE | Freq: Two times a day (BID) | ORAL | Status: DC
  Administered 2020-07-18 – 2020-07-22 (×7): 100 mg via ORAL
  Filled 2020-07-18 (×8): qty 1

## 2020-07-18 MED ORDER — INSULIN DETEMIR 100 UNIT/ML ~~LOC~~ SOLN
15.0000 [IU] | Freq: Every day | SUBCUTANEOUS | Status: DC
  Administered 2020-07-18: 15 [IU] via SUBCUTANEOUS
  Filled 2020-07-18: qty 0.15

## 2020-07-18 MED ORDER — ENSURE ENLIVE PO LIQD: 237.0000 mL | Freq: Two times a day (BID) | ORAL | Status: DC

## 2020-07-18 MED ORDER — INSULIN DETEMIR 100 UNIT/ML ~~LOC~~ SOLN: 20.0000 [IU] | Freq: Every day | SUBCUTANEOUS | Status: DC

## 2020-07-18 MED ORDER — INSULIN ASPART 100 UNIT/ML ~~LOC~~ SOLN
7.0000 [IU] | Freq: Once | SUBCUTANEOUS | Status: AC
  Administered 2020-07-18: 7 [IU] via SUBCUTANEOUS

## 2020-07-18 NOTE — Progress Notes (Addendum)
Initial Nutrition Assessment  DOCUMENTATION CODES:   Obesity unspecified  INTERVENTION:   -Ensure Enlive po BID, each supplement provides 350 kcal and 20 grams of protein -Continue vitamin supplementation  NUTRITION DIAGNOSIS:   Increased nutrient needs related to acute illness (COVID-19 infection) as evidenced by estimated needs.  GOAL:   Patient will meet greater than or equal to 90% of their needs  MONITOR:   PO intake, Supplement acceptance, Labs, Weight trends, I & O's  REASON FOR ASSESSMENT:   Malnutrition Screening Tool    ASSESSMENT:   69 year old gentleman with history of insulin-dependent type 2 diabetes, BPH, diagnosed with COVID-19 on 8/24 presented to emergency room on 9/1 with progressively worsening shortness of breath despite conservative management at home.  Lethargic and poor oral intake.  Patient [id number] positive since 8/24. Pt has had decreased appetite since symptom onset.   Pt consuming 80% of meals at this time. Given increased needs will order Ensure supplements and daily MVI.  Per weight records, pt's weight has remained stable.   Medications: Vitamin C, B complex with Vit C, Vitamin E, Zinc sulfate Labs reviewed: CBGs: 270-283  NUTRITION - FOCUSED PHYSICAL EXAM:  Unable to complete at this time  Diet Order:   Diet Order            Diet Carb Modified Fluid consistency: Thin; Room service appropriate? Yes  Diet effective now                 EDUCATION NEEDS:   No education needs have been identified at this time  Skin:  Skin Assessment: Reviewed RN Assessment  Last BM:  PTA  Height:   Ht Readings from Last 1 Encounters:  07/16/20 5\' 10"  (1.778 m)    Weight:   Wt Readings from Last 1 Encounters:  07/16/20 108 kg    BMI:  Body mass index is 34.16 kg/m.  Estimated Nutritional Needs:   Kcal:  2100-2300  Protein:  110-120g  Fluid:  2L/day   Clayton Bibles, MS, RD, LDN Inpatient Clinical Dietitian Contact  information available via Amion

## 2020-07-18 NOTE — Care Management Important Message (Signed)
Important Message  Patient Details IM Letter given to the Patient Name: Jonathan Shelton MRN: [id number] Date of Birth: 04/30/1951   Medicare Important Message Given:  Yes     Kerin Salen 07/18/2020, 10:19 AM

## 2020-07-18 NOTE — TOC Progression Note (Signed)
Transition of Care [facility]) - Progression Note    Patient Details  Name: DAEVON HOLDREN MRN: [id number] Date of Birth: [birth year]  [facility] CM/SW Contact  Purcell Mouton, RN Phone Number: 07/18/2020, 4:04 PM  Clinical Narrative:    TOC to follow for [facility] needs.    Expected Discharge Plan: [place] Barriers to Discharge: No Barriers Identified  Expected Discharge Plan and Services Expected Discharge Plan: [place] arrangements for the past 2 months: Single Family Home                                       Social Determinants of Health (SDOH) Interventions    Readmission Risk Interventions No flowsheet data found.

## 2020-07-18 NOTE — Progress Notes (Signed)
PROGRESS NOTE    Jonathan Shelton  PJA:[id number] DOB: 1951/02/14 DOA: 07/16/2020 PCP: [name], DO    Brief Narrative:  69 year old gentleman with history of insulin-dependent type 2 diabetes, BPH, diagnosed with COVID-19 on 8/24 presented to emergency room on 9/1 with progressively worsening shortness of breath despite conservative management at home.  Lethargic and poor oral intake. In the emergency room, afebrile.  82% on room air.  Needed 6 L of oxygen.  Bilateral infiltrates left more than right.  CRP 15.9.  Procalcitonin normal.  D-dimer 4.59.   Assessment & Plan:   Active Problems:   Type 2 diabetes mellitus with other specified complication (HCC)   Benign prostatic hyperplasia with incomplete bladder emptying   Obesity (BMI 30.0-34.9)   Acute respiratory disease due to COVID-19 virus  Acute hypoxemic respiratory failure due to COVID-19 pneumonia: Continue to monitor due to significant symptoms  chest physiotherapy, incentive spirometry, deep breathing exercises, sputum induction, mucolytic's and bronchodilators. Supplemental oxygen to keep saturations more than 85%. Covid directed therapy with , steroids, high-dose IV Solu-Medrol remdesivir, day 3/5 Baricitinib, day 2/14 or until hospital discharge convalescent plasma, not indicated antibiotics, not indicated Due to severity of symptoms, patient will need daily inflammatory markers, chest x-rays, liver function test to monitor and direct COVID-19 therapies. CT angiogram was done that is negative for pulmonary embolism, shows extensive bilateral infiltrates.  COVID-19 Labs  Recent Labs    07/16/20 1349 07/16/20 1358 07/17/20 0539 07/18/20 0439  DDIMER  --  4.59* 4.26* 3.57*  FERRITIN 1,283*  --   --   --   LDH  --  524*  --   --   CRP 19.5*  --  15.1* 7.9*    Lab Results  Component Value Date   SARSCOV2NAA NEGATIVE 03/27/2020   [place] NEGATIVE 02/04/2020   [place] Not Detected  01/15/2020   [place] Not Detected 10/25/2019   SpO2: 90 % O2 Flow Rate (L/min): 15 L/min FiO2 (%): 100 %  Type 2 diabetes on insulin at home: On increased dose of insulin with added to steroids.  We will continue to titrate.  Hyperlipidemia: On a statin.  DVT prophylaxis: Lovenox subcu   Code Status: Full code Family Communication: wife on the phone 9/2 Disposition Plan: Status is: Inpatient  Remains inpatient appropriate because:Inpatient level of care appropriate due to severity of illness   Dispo: The patient is from: Home              Anticipated d/c is to: Home              Anticipated d/c date is: > 3 days              Patient currently is not medically stable to d/c.         Consultants:   None  Procedures:   None  Antimicrobials:  Anti-infectives (From admission, onward)   Start     Dose/Rate Route Frequency Ordered Stop   07/17/20 1000  remdesivir 100 mg in sodium chloride 0.9 % 100 mL IVPB       "Followed by" Linked Group Details   100 mg 200 mL/hr over 30 Minutes Intravenous Daily 07/16/20 1444 07/21/20 0959   07/16/20 1500  remdesivir 200 mg in sodium chloride 0.9% 250 mL IVPB       "Followed by" Linked Group Details   200 mg 580 mL/hr over 30 Minutes Intravenous Once 07/16/20 1444 07/16/20 1613   07/16/20 1445  cefTRIAXone (ROCEPHIN) 1  g in sodium chloride 0.9 % 100 mL IVPB  Status:  Discontinued        1 g 200 mL/hr over 30 Minutes Intravenous  Once 07/16/20 1438 07/16/20 1531   07/16/20 1445  azithromycin (ZITHROMAX) 500 mg in sodium chloride 0.9 % 250 mL IVPB  Status:  Discontinued        500 mg 250 mL/hr over 60 Minutes Intravenous  Once 07/16/20 1438 07/16/20 1531         Subjective: Patient seen and examined. Subjectively he feels okay. He still on high flow oxygen. No other overnight events. He wants to get around, take shower.  Objective: Vitals:   07/18/20 0005 07/18/20 0450 07/18/20 1050 07/18/20 1145  BP: 128/82 129/77   104/61  Pulse: 80 82  97  Resp: (!) 23 (!) 21  (!) 22  Temp: 98.2 F (36.8 C) 98.4 F (36.9 C)  98 F (36.7 C)  TempSrc: Oral Oral  Oral  SpO2: 95% 91% 91% 90%  Weight:      Height:        Intake/Output Summary (Last 24 hours) at 07/18/2020 1311 Last data filed at 07/18/2020 1159 Gross per 24 hour  Intake 580 ml  Output 1050 ml  Net -470 ml   Filed Weights   07/16/20 1644  Weight: 108 kg    Examination:  Physical Exam Constitutional:      Appearance: Normal appearance.     Comments: In mild distress due to breathing and coughing. Anxious. On 15 L nonrebreather.  HENT:     Head: Atraumatic.  Cardiovascular:     Rate and Rhythm: Regular rhythm.     Heart sounds: Normal heart sounds.  Pulmonary:     Comments: Mostly equal air entry, bilaterally poor air entry with no added sounds. No wheezing or crackles. Neurological:     General: No focal deficit present.     Mental Status: He is oriented to person, place, and time.       Data Reviewed: I have personally reviewed following labs and imaging studies  CBC: Recent Labs  Lab 07/16/20 1358 07/17/20 0539 07/18/20 0439  WBC 6.5 4.1 6.6  NEUTROABS 5.2 3.2 4.8  HGB 16.0 14.2 14.6  HCT 47.6 41.7 43.2  MCV 88.0 89.5 89.1  PLT 200 193 818   Basic Metabolic Panel: Recent Labs  Lab 07/16/20 1358 07/17/20 0539 07/18/20 0439  NA 132* 134* 135  K 4.1 4.3 4.7  CL 96* 103 104  CO2 18* 16* 19*  GLUCOSE 130* 210* 276*  BUN 24* 30* 30*  CREATININE 0.97 0.77 0.80  CALCIUM 9.5 8.4* 8.2*   GFR: Estimated Creatinine Clearance: 108.8 mL/min (by C-G formula based on SCr of 0.8 mg/dL). Liver Function Tests: Recent Labs  Lab 07/16/20 1358 07/17/20 0539 07/18/20 0439  AST 61* 46* 40  ALT 38 34 36  ALKPHOS 51 47 46  BILITOT 1.2 0.8 0.8  PROT 7.9 6.5 6.2*  ALBUMIN 3.2* 2.6* 2.6*   No results for input(s): LIPASE, AMYLASE in the last 168 hours. No results for input(s): AMMONIA in the last 168 hours. Coagulation  Profile: No results for input(s): INR, PROTIME in the last 168 hours. Cardiac Enzymes: No results for input(s): CKTOTAL, CKMB, CKMBINDEX, TROPONINI in the last 168 hours. BNP (last 3 results) No results for input(s): PROBNP in the last 8760 hours. HbA1C: Recent Labs    07/17/20 0539  HGBA1C 7.6*   CBG: Recent Labs  Lab 07/17/20 1205 07/17/20 1635  07/17/20 2045 07/18/20 0802 07/18/20 1141  GLUCAP 237* 294* 276* 270* 283*   Lipid Profile: Recent Labs    07/16/20 1349  TRIG 119   Thyroid Function Tests: No results for input(s): TSH, T4TOTAL, FREET4, T3FREE, THYROIDAB in the last 72 hours. Anemia Panel: Recent Labs    07/16/20 1349  FERRITIN 1,283*   Sepsis Labs: Recent Labs  Lab 07/16/20 1358  PROCALCITON <0.10  LATICACIDVEN 1.3    Recent Results (from the past 240 hour(s))  Blood Culture (routine x 2)     Status: None (Preliminary result)   Collection Time: 07/16/20  1:58 PM   Specimen: BLOOD RIGHT FOREARM  Result Value Ref Range Status   Specimen Description   Final    BLOOD RIGHT FOREARM Performed at [facility] [address], [place], [place] [address]    Special Requests   Final    BOTTLES DRAWN AEROBIC AND ANAEROBIC Blood Culture adequate volume Performed at [facility] [address] [place], [place] [address]    Culture   Final    NO GROWTH 2 DAYS Performed at [facility] [address], [place], [place] [address]    Report Status PENDING  Incomplete  Blood Culture (routine x 2)     Status: None (Preliminary result)   Collection Time: 07/16/20  1:58 PM   Specimen: BLOOD  Result Value Ref Range Status   Specimen Description   Final    BLOOD RIGHT ANTECUBITAL Performed at [facility] [address], [place], [place] [address]    Special Requests   Final    BOTTLES DRAWN AEROBIC AND ANAEROBIC Blood Culture adequate volume Performed at [facility] [address] [place], [place] [address]    Culture   Final    NO GROWTH 2 DAYS Performed at [facility] [address], [place], [place] [address]    Report Status PENDING  Incomplete         Radiology Studies: CT ANGIO CHEST PE W OR WO CONTRAST  Result Date: 07/17/2020 CLINICAL DATA:  COVID-19 viral infection. Increased oxygen demand and elevated D-dimer. Clinical suspicion for pulmonary embolism. EXAM: CT ANGIOGRAPHY CHEST WITH CONTRAST TECHNIQUE: Multidetector CT imaging of the chest was performed using the standard protocol during bolus administration of intravenous contrast. Multiplanar CT image reconstructions and MIPs were obtained to evaluate the vascular anatomy. CONTRAST:  40mL OMNIPAQUE IOHEXOL 350 MG/ML SOLN COMPARISON:  None. FINDINGS: Cardiovascular: Satisfactory opacification of pulmonary arteries noted, and no pulmonary emboli identified. No evidence of thoracic aortic dissection or aneurysm. Mediastinum/Nodes: No masses or pathologically enlarged lymph nodes identified. Lungs/Pleura: Bilateral diffuse pulmonary airspace disease is seen, with greatest involvement of the lower lobes. No evidence of pleural effusion. Upper abdomen: No acute findings. Musculoskeletal: No suspicious bone lesions identified. Review of the MIP images confirms the above findings. IMPRESSION: No evidence of pulmonary embolism. Bilateral diffuse pulmonary airspace disease with lower lobe predominance, consistent with viral pneumonia. Electronically Signed   By: Marlaine Hind M.D.   On: 07/17/2020 11:36   DG Chest Port 1 View  Result Date: 07/16/2020 CLINICAL DATA:  Shortness of breath. Patient reports positive COVID test. EXAM: PORTABLE CHEST 1 VIEW COMPARISON:  None. FINDINGS: The heart size and mediastinal contours are within normal limits. Left worse than right interstitial opacities with basilar predominance. No pleural effusion or pneumothorax. The visualized skeletal structures are unremarkable.  IMPRESSION: Left worse than right interstitial opacities with  basilar predominance. Findings suspicious for atypical/viral infection. Electronically Signed   By: Davina Poke D.O.   On: 07/16/2020 14:27        Scheduled Meds:  vitamin C  500 mg Oral Daily   aspirin  81 mg Oral Daily   atorvastatin  10 mg Oral q1800   B-complex with vitamin C  1 tablet Oral Daily   baricitinib  4 mg Oral Daily   enoxaparin (LOVENOX) injection  0.5 mg/kg Subcutaneous Q12H   finasteride  5 mg Oral Daily   insulin aspart  0-15 Units Subcutaneous TID WC   insulin aspart  0-5 Units Subcutaneous QHS   insulin detemir  15 Units Subcutaneous QHS   irbesartan  75 mg Oral Daily   linagliptin  5 mg Oral Daily   methylPREDNISolone (SOLU-MEDROL) injection  80 mg Intravenous Q12H   sodium chloride flush  3 mL Intravenous Q12H   tamsulosin  0.4 mg Oral QPC supper   vitamin E  400 Units Oral Daily   zinc sulfate  220 mg Oral Daily   Continuous Infusions:  remdesivir 100 mg in NS 100 mL 100 mg (07/18/20 0845)     LOS: 2 days    Time spent: 30 minutes    Barb Merino, MD [facility] Pager [phone]

## 2020-07-18 NOTE — Progress Notes (Signed)
Physical Therapy Treatment Patient Details Name: Jonathan Shelton MRN: [id number] DOB: July 25, 1951 Today's Date: 07/18/2020    History of Present Illness Patient is a 69 year old male history of insulin-dependent type 2 diabetes, BPH, diagnosed with COVID-19 on 8/24 presented to emergency room on 9/1 with progressively worsening shortness of breath    PT Comments    Pt in bed requesting to use the bathroom currently on 15 HFNC at 94% at rest.  Trial RA during activity.  General transfer comment: assist for safety only.  Assisted OOB to [facility] then to amb around the room. General Gait Details: used a walker just for safety (pt will not need at home) amb around room several times trial RA decreased to 80% with 3/4 dyspnea.  required a seated rest break.  Reapplied nasal for 6 min recovery >90%.  HR avg 76.  Still limited activity tolerance.   Pt voided 125 BP 113/67 HT 76 HFNC 15 lts 94% Temp 98.6 Assisted back to bed and assisted with oral hygiene per pt request.  Very sweet Gentleman.    Follow Up Recommendations  Home health PT     Equipment Recommendations  None recommended by PT    Recommendations for Other Services       Precautions / Restrictions Precautions Precautions: None Precaution Comments: monitor vitals    Mobility  Bed Mobility Overal bed mobility: Modified Independent             General bed mobility comments: increased time  Transfers Overall transfer level: Modified independent Equipment used: Rolling walker (2 wheeled);None             General transfer comment: assist for safety only.  Assisted OOB to [facility] then to amb around the room.  Ambulation/Gait Ambulation/Gait assistance: Supervision;Min guard Gait Distance (Feet): 80 Feet (40 feet x 2) Assistive device: Rolling walker (2 wheeled) Gait Pattern/deviations: Step-to pattern;Step-through pattern Gait velocity: decreased   General Gait Details: used a walker just for safety (pt will not  need at home) amb around room several times trial RA decreased to 80% with 3/4 dyspnea.  required a seated rest break.   Stairs             Wheelchair Mobility    Modified Rankin (Stroke Patients Only)       Balance                                            Cognition Arousal/Alertness: Awake/alert Behavior During Therapy: WFL for tasks assessed/performed Overall Cognitive Status: Within Functional Limits for tasks assessed                                 General Comments: AxO x 3 pleasant Pastor from [place]      Exercises      General Comments        Pertinent Vitals/Pain Pain Assessment: No/denies pain    Home Living                      Prior Function            PT Goals (current goals can now be found in the care plan section) Progress towards PT goals: Progressing toward goals    Frequency    Min 3X/week  PT Plan Current plan remains appropriate    Co-evaluation              AM-PAC PT "6 Clicks" Mobility   Outcome Measure  Help needed turning from your back to your side while in a flat bed without using bedrails?: None Help needed moving from lying on your back to sitting on the side of a flat bed without using bedrails?: None Help needed moving to and from a bed to a chair (including a wheelchair)?: A Little Help needed standing up from a chair using your arms (e.g., wheelchair or bedside chair)?: A Little Help needed to walk in hospital room?: A Little Help needed climbing 3-5 steps with a railing? : A Lot 6 Click Score: 19    End of Session Equipment Utilized During Treatment: Oxygen;Gait belt Activity Tolerance: Other (comment) (dyspnea) Patient left: in bed;with call bell/phone within reach Nurse Communication: Mobility status PT Visit Diagnosis: Unsteadiness on feet (R26.81);Difficulty in walking, not elsewhere classified (R26.2)     Time: 1510-1540 PT Time Calculation (min)  (ACUTE ONLY): 30 min  Charges:  $Gait Training: 8-22 mins $Therapeutic Activity: 8-22 mins                     Rica Koyanagi  PTA Acute  Rehabilitation Services Pager      [phone] Office      [phone]

## 2020-07-18 NOTE — Plan of Care (Signed)

## 2020-07-19 LAB — CBC WITH DIFFERENTIAL/PLATELET
Abs Immature Granulocytes: 0.09 10*3/uL — ABNORMAL HIGH (ref 0.00–0.07)
Basophils Absolute: 0 10*3/uL (ref 0.0–0.1)
Basophils Relative: 0 %
Eosinophils Absolute: 0 10*3/uL (ref 0.0–0.5)
Eosinophils Relative: 0 %
HCT: 45.5 % (ref 39.0–52.0)
Hemoglobin: 15.1 g/dL (ref 13.0–17.0)
Immature Granulocytes: 1 %
Lymphocytes Relative: 10 %
Lymphs Abs: 0.8 10*3/uL (ref 0.7–4.0)
MCH: 29.7 pg (ref 26.0–34.0)
MCHC: 33.2 g/dL (ref 30.0–36.0)
MCV: 89.6 fL (ref 80.0–100.0)
Monocytes Absolute: 0.5 10*3/uL (ref 0.1–1.0)
Monocytes Relative: 6 %
Neutro Abs: 7 10*3/uL (ref 1.7–7.7)
Neutrophils Relative %: 83 %
Platelets: 269 10*3/uL (ref 150–400)
RBC: 5.08 MIL/uL (ref 4.22–5.81)
RDW: 14.2 % (ref 11.5–15.5)
WBC: 8.4 10*3/uL (ref 4.0–10.5)
nRBC: 0 % (ref 0.0–0.2)

## 2020-07-19 LAB — COMPREHENSIVE METABOLIC PANEL
ALT: 50 U/L — ABNORMAL HIGH (ref 0–44)
AST: 46 U/L — ABNORMAL HIGH (ref 15–41)
Albumin: 2.7 g/dL — ABNORMAL LOW (ref 3.5–5.0)
Alkaline Phosphatase: 52 U/L (ref 38–126)
Anion gap: 9 (ref 5–15)
BUN: 31 mg/dL — ABNORMAL HIGH (ref 8–23)
CO2: 23 mmol/L (ref 22–32)
Calcium: 8.5 mg/dL — ABNORMAL LOW (ref 8.9–10.3)
Chloride: 102 mmol/L (ref 98–111)
Creatinine, Ser: 0.86 mg/dL (ref 0.61–1.24)
GFR calc Af Amer: >60 mL/min (ref >60)
GFR calc non Af Amer: >60 mL/min (ref >60)
Glucose, Bld: 343 mg/dL — ABNORMAL HIGH (ref 70–99)
Potassium: 4.6 mmol/L (ref 3.5–5.1)
Sodium: 134 mmol/L — ABNORMAL LOW (ref 135–145)
Total Bilirubin: 1 mg/dL (ref 0.3–1.2)
Total Protein: 6.4 g/dL — ABNORMAL LOW (ref 6.5–8.1)

## 2020-07-19 LAB — C-REACTIVE PROTEIN: CRP: 4.3 mg/dL — ABNORMAL HIGH (ref <1.0)

## 2020-07-19 LAB — GLUCOSE, CAPILLARY
Glucose-Capillary: 288 mg/dL — ABNORMAL HIGH (ref 70–99)
Glucose-Capillary: 345 mg/dL — ABNORMAL HIGH (ref 70–99)
Glucose-Capillary: 257 mg/dL — ABNORMAL HIGH (ref 70–99)

## 2020-07-19 LAB — D-DIMER, QUANTITATIVE: D-Dimer, Quant: 3.19 ug/mL-FEU — ABNORMAL HIGH (ref 0.00–0.50)

## 2020-07-19 MED ORDER — INSULIN DETEMIR 100 UNIT/ML ~~LOC~~ SOLN
25.0000 [IU] | Freq: Every day | SUBCUTANEOUS | Status: DC
  Administered 2020-07-19: 25 [IU] via SUBCUTANEOUS
  Filled 2020-07-19: qty 0.25

## 2020-07-19 NOTE — Progress Notes (Signed)
PROGRESS NOTE    Jonathan Shelton  BSW:[id number] DOB: Jun 11, 1951 DOA: 07/16/2020 PCP: [name], DO    Brief Narrative:  69 year old gentleman with history of insulin-dependent type 2 diabetes, BPH, diagnosed with COVID-19 on 8/24 presented to emergency room on 9/1 with progressively worsening shortness of breath despite conservative management at home.  Lethargic and poor oral intake. In the emergency room, afebrile.  82% on room air.  Needed 6 L of oxygen.  Bilateral infiltrates left more than right.  CRP 15.9.  Procalcitonin normal.  D-dimer 4.59.   Assessment & Plan:   Active Problems:   Type 2 diabetes mellitus with other specified complication (HCC)   Benign prostatic hyperplasia with incomplete bladder emptying   Obesity (BMI 30.0-34.9)   Acute respiratory disease due to COVID-19 virus  Acute hypoxemic respiratory failure due to COVID-19 pneumonia: Continue to monitor due to significant symptoms  chest physiotherapy, incentive spirometry, deep breathing exercises, sputum induction, mucolytic's and bronchodilators. Supplemental oxygen to keep saturations more than 85%. Covid directed therapy with , steroids, high-dose IV Solu-Medrol remdesivir, day 4/5 Baricitinib, day 3/14 or until hospital discharge convalescent plasma, not indicated antibiotics, not indicated Due to severity of symptoms, patient will need daily inflammatory markers, chest x-rays, liver function test to monitor and direct COVID-19 therapies. CT angiogram was done that is negative for pulmonary embolism, shows extensive bilateral infiltrates.  COVID-19 Labs  Recent Labs    07/16/20 1349 07/16/20 1349 07/16/20 1358 07/17/20 0539 07/18/20 0439 07/19/20 0455  DDIMER  --    < > 4.59* 4.26* 3.57* 3.19*  FERRITIN 1,283*  --   --   --   --   --   LDH  --   --  524*  --   --   --   CRP 19.5*   < >  --  15.1* 7.9* 4.3*   < > = values in this interval not displayed.    Lab Results   Component Value Date   SARSCOV2NAA NEGATIVE 03/27/2020   [place] NEGATIVE 02/04/2020   [place] Not Detected 01/15/2020   [place] Not Detected 10/25/2019   SpO2: 92 % O2 Flow Rate (L/min): 10 L/min FiO2 (%): 100 %  Type 2 diabetes on insulin at home: On increased dose of insulin with added to steroids.  We will continue to titrate.  Hyperlipidemia: On a statin.  DVT prophylaxis: Lovenox subcu   Code Status: Full code Family Communication: wife on the phone. Disposition Plan: Status is: Inpatient  Remains inpatient appropriate because:Inpatient level of care appropriate due to severity of illness   Dispo: The patient is from: Home              Anticipated d/c is to: Home              Anticipated d/c date is: > 3 days              Patient currently is not medically stable to d/c.         Consultants:   None  Procedures:   None  Antimicrobials:  Anti-infectives (From admission, onward)   Start     Dose/Rate Route Frequency Ordered Stop   07/17/20 1000  remdesivir 100 mg in sodium chloride 0.9 % 100 mL IVPB       "Followed by" Linked Group Details   100 mg 200 mL/hr over 30 Minutes Intravenous Daily 07/16/20 1444 07/21/20 0959   07/16/20 1500  remdesivir 200 mg in sodium chloride 0.9% 250  mL IVPB       "Followed by" Linked Group Details   200 mg 580 mL/hr over 30 Minutes Intravenous Once 07/16/20 1444 07/16/20 1613   07/16/20 1445  cefTRIAXone (ROCEPHIN) 1 g in sodium chloride 0.9 % 100 mL IVPB  Status:  Discontinued        1 g 200 mL/hr over 30 Minutes Intravenous  Once 07/16/20 1438 07/16/20 1531   07/16/20 1445  azithromycin (ZITHROMAX) 500 mg in sodium chloride 0.9 % 250 mL IVPB  Status:  Discontinued        500 mg 250 mL/hr over 60 Minutes Intravenous  Once 07/16/20 1438 07/16/20 1531         Subjective: Patient seen and examined. He is on 10 liters of oxygen today. Disappointment with being restricted in the room.  No other overnight  events.  Difficulty to take deep breaths in.   Objective: Vitals:   07/18/20 2025 07/19/20 0512 07/19/20 1050 07/19/20 1141  BP: 125/66 121/71    Pulse: 71 71 69   Resp: 20 20    Temp: 97.9 F (36.6 C) 98.2 F (36.8 C) 97.8 F (36.6 C)   TempSrc: Oral Oral Oral   SpO2: (!) 81% (!) 88% 92% 92%  Weight:      Height:        Intake/Output Summary (Last 24 hours) at 07/19/2020 1323 Last data filed at 07/19/2020 1000 Gross per 24 hour  Intake --  Output 2575 ml  Net -2575 ml   Filed Weights   07/16/20 1644  Weight: 108 kg    Examination:  Physical Exam Constitutional:      Appearance: Normal appearance.     Comments: In mild distress due to breathing and coughing. Anxious.  On 10 L of oxygen.  HENT:     Head: Atraumatic.  Cardiovascular:     Rate and Rhythm: Regular rhythm.     Heart sounds: Normal heart sounds.  Pulmonary:     Comments: Mostly equal air entry, bilaterally poor air entry with no added sounds. No wheezing or crackles. Neurological:     General: No focal deficit present.     Mental Status: He is oriented to person, place, and time.       Data Reviewed: I have personally reviewed following labs and imaging studies  CBC: Recent Labs  Lab 07/16/20 1358 07/17/20 0539 07/18/20 0439 07/19/20 0455  WBC 6.5 4.1 6.6 8.4  NEUTROABS 5.2 3.2 4.8 7.0  HGB 16.0 14.2 14.6 15.1  HCT 47.6 41.7 43.2 45.5  MCV 88.0 89.5 89.1 89.6  PLT 200 193 235 962   Basic Metabolic Panel: Recent Labs  Lab 07/16/20 1358 07/17/20 0539 07/18/20 0439 07/19/20 0455  NA 132* 134* 135 134*  K 4.1 4.3 4.7 4.6  CL 96* 103 104 102  CO2 18* 16* 19* 23  GLUCOSE 130* 210* 276* 343*  BUN 24* 30* 30* 31*  CREATININE 0.97 0.77 0.80 0.86  CALCIUM 9.5 8.4* 8.2* 8.5*   GFR: Estimated Creatinine Clearance: 101.2 mL/min (by C-G formula based on SCr of 0.86 mg/dL). Liver Function Tests: Recent Labs  Lab 07/16/20 1358 07/17/20 0539 07/18/20 0439 07/19/20 0455  AST 61* 46* 40  46*  ALT 38 34 36 50*  ALKPHOS 51 47 46 52  BILITOT 1.2 0.8 0.8 1.0  PROT 7.9 6.5 6.2* 6.4*  ALBUMIN 3.2* 2.6* 2.6* 2.7*   No results for input(s): LIPASE, AMYLASE in the last 168 hours. No results for input(s): AMMONIA  in the last 168 hours. Coagulation Profile: No results for input(s): INR, PROTIME in the last 168 hours. Cardiac Enzymes: No results for input(s): CKTOTAL, CKMB, CKMBINDEX, TROPONINI in the last 168 hours. BNP (last 3 results) No results for input(s): PROBNP in the last 8760 hours. HbA1C: Recent Labs    07/17/20 0539  HGBA1C 7.6*   CBG: Recent Labs  Lab 07/18/20 0802 07/18/20 1141 07/18/20 1656 07/18/20 2015 07/19/20 1042  GLUCAP 270* 283* 357* 415* 288*   Lipid Profile: Recent Labs    07/16/20 1349  TRIG 119   Thyroid Function Tests: No results for input(s): TSH, T4TOTAL, FREET4, T3FREE, THYROIDAB in the last 72 hours. Anemia Panel: Recent Labs    07/16/20 1349  FERRITIN 1,283*   Sepsis Labs: Recent Labs  Lab 07/16/20 1358  PROCALCITON <0.10  LATICACIDVEN 1.3    Recent Results (from the past 240 hour(s))  Blood Culture (routine x 2)     Status: None (Preliminary result)   Collection Time: 07/16/20  1:58 PM   Specimen: BLOOD RIGHT FOREARM  Result Value Ref Range Status   Specimen Description   Final    BLOOD RIGHT FOREARM Performed at [facility] [address], [place], [place] [address]    Special Requests   Final    BOTTLES DRAWN AEROBIC AND ANAEROBIC Blood Culture adequate volume Performed at [facility] [address] [place], [place] [address]    Culture   Final    NO GROWTH 2 DAYS Performed at [facility] [address], [place], [place] [address]    Report Status PENDING  Incomplete  Blood Culture (routine x 2)     Status: None (Preliminary result)   Collection Time: 07/16/20  1:58 PM   Specimen: BLOOD  Result Value Ref Range Status   Specimen Description   Final     BLOOD RIGHT ANTECUBITAL Performed at [facility] [address], [place], [place] [address]    Special Requests   Final    BOTTLES DRAWN AEROBIC AND ANAEROBIC Blood Culture adequate volume Performed at [facility] [address] [place], [place] [address]    Culture   Final    NO GROWTH 2 DAYS Performed at [facility] [address], [place], [place] [address]    Report Status PENDING  Incomplete         Radiology Studies: No results found.      Scheduled Meds:  vitamin C  500 mg Oral Daily   aspirin  81 mg Oral Daily   atorvastatin  10 mg Oral q1800   B-complex with vitamin C  1 tablet Oral Daily   baricitinib  4 mg Oral Daily   docusate sodium  100 mg Oral BID   enoxaparin (LOVENOX) injection  0.5 mg/kg Subcutaneous Q12H   feeding supplement (ENSURE ENLIVE)  237 mL Oral BID BM   finasteride  5 mg Oral Daily   insulin aspart  0-15 Units Subcutaneous TID WC   insulin aspart  0-5 Units Subcutaneous QHS   insulin detemir  25 Units Subcutaneous QHS   irbesartan  75 mg Oral Daily   linagliptin  5 mg Oral Daily   methylPREDNISolone (SOLU-MEDROL) injection  80 mg Intravenous Q12H   sodium chloride flush  3 mL Intravenous Q12H   tamsulosin  0.4 mg Oral QPC supper   vitamin E  400 Units Oral Daily   zinc sulfate  220 mg Oral  Daily   Continuous Infusions:  remdesivir 100 mg in NS 100 mL 100 mg (07/19/20 1122)     LOS: 3 days    Time spent: 30 minutes    Barb Merino, MD [facility] Pager [phone]

## 2020-07-19 NOTE — Progress Notes (Signed)
Physical Therapy Treatment Patient Details Name: Jonathan Shelton MRN: [id number] DOB: 1951/01/19 Today's Date: 07/19/2020    History of Present Illness Patient is a 69 year old male history of insulin-dependent type 2 diabetes, BPH, diagnosed with COVID-19 on 8/24 presented to emergency room on 9/1 with progressively worsening shortness of breath    PT Comments    Assisted with amb around room then back to bed. General Gait Details: used a walker just for safety (pt will not need at home) amb around room several times trial RA decreased to 83% with 3/4 dyspnea.  required a seated rest break.  Decreased activity level this session.  Increased c/o feeling "wore out today" .  Follow Up Recommendations  Home health PT     Equipment Recommendations  None recommended by PT    Recommendations for Other Services       Precautions / Restrictions Precautions Precautions: None Precaution Comments: monitor vitals Restrictions Weight Bearing Restrictions: No    Mobility  Bed Mobility Overal bed mobility: Modified Independent             General bed mobility comments: increased time.  Assisted back to bed.  Transfers Overall transfer level: Modified independent Equipment used: Rolling walker (2 wheeled);None             General transfer comment: assist for safety only.  Assisted OOB to amb around the room.  Ambulation/Gait Ambulation/Gait assistance: Supervision;Min guard Gait Distance (Feet): 35 Feet Assistive device: Rolling walker (2 wheeled) Gait Pattern/deviations: Step-to pattern;Step-through pattern Gait velocity: decreased   General Gait Details: used a walker just for safety (pt will not need at home) amb around room several times trial RA decreased to 83% with 3/4 dyspnea.  required a seated rest break.  Decreased activity level this session.  Increased c/o feeling "wore out today" .   Stairs             Wheelchair Mobility    Modified Rankin  (Stroke Patients Only)       Balance                                            Cognition Arousal/Alertness: Awake/alert Behavior During Therapy: WFL for tasks assessed/performed Overall Cognitive Status: Within Functional Limits for tasks assessed                                 General Comments: AxO x 3 pleasant Pastor from [place] but slightly off on his thought process      Exercises      General Comments        Pertinent Vitals/Pain Pain Assessment: No/denies pain    Home Living                      Prior Function            PT Goals (current goals can now be found in the care plan section) Progress towards PT goals: Progressing toward goals    Frequency    Min 3X/week      PT Plan Current plan remains appropriate    Co-evaluation              AM-PAC PT "6 Clicks" Mobility   Outcome Measure  Help needed turning from your back to your side  while in a flat bed without using bedrails?: None Help needed moving from lying on your back to sitting on the side of a flat bed without using bedrails?: None Help needed moving to and from a bed to a chair (including a wheelchair)?: A Little Help needed standing up from a chair using your arms (e.g., wheelchair or bedside chair)?: A Little Help needed to walk in hospital room?: A Little Help needed climbing 3-5 steps with a railing? : A Lot 6 Click Score: 19    End of Session Equipment Utilized During Treatment: Oxygen;Gait belt Activity Tolerance: Patient limited by fatigue Patient left: in bed;with call bell/phone within reach Nurse Communication: Mobility status PT Visit Diagnosis: Unsteadiness on feet (R26.81);Difficulty in walking, not elsewhere classified (R26.2)     Time: 0355-9741 PT Time Calculation (min) (ACUTE ONLY): 25 min  Charges:  $Gait Training: 8-22 mins $Therapeutic Activity: 8-22 mins                     Rica Koyanagi  PTA Acute   Rehabilitation Services Pager      [phone] Office      [phone]

## 2020-07-20 LAB — CBC WITH DIFFERENTIAL/PLATELET
Abs Immature Granulocytes: 0.05 10*3/uL (ref 0.00–0.07)
Basophils Absolute: 0 10*3/uL (ref 0.0–0.1)
Basophils Relative: 0 %
Eosinophils Absolute: 0 10*3/uL (ref 0.0–0.5)
Eosinophils Relative: 0 %
HCT: 43.5 % (ref 39.0–52.0)
Hemoglobin: 14.5 g/dL (ref 13.0–17.0)
Immature Granulocytes: 1 %
Lymphocytes Relative: 5 %
Lymphs Abs: 0.3 10*3/uL — ABNORMAL LOW (ref 0.7–4.0)
MCH: 29.7 pg (ref 26.0–34.0)
MCHC: 33.3 g/dL (ref 30.0–36.0)
MCV: 89 fL (ref 80.0–100.0)
Monocytes Absolute: 0.4 10*3/uL (ref 0.1–1.0)
Monocytes Relative: 6 %
Neutro Abs: 6.1 10*3/uL (ref 1.7–7.7)
Neutrophils Relative %: 88 %
Platelets: 245 10*3/uL (ref 150–400)
RBC: 4.89 MIL/uL (ref 4.22–5.81)
RDW: 13.9 % (ref 11.5–15.5)
WBC: 6.9 10*3/uL (ref 4.0–10.5)
nRBC: 0 % (ref 0.0–0.2)

## 2020-07-20 LAB — COMPREHENSIVE METABOLIC PANEL
ALT: 48 U/L — ABNORMAL HIGH (ref 0–44)
AST: 40 U/L (ref 15–41)
Albumin: 2.7 g/dL — ABNORMAL LOW (ref 3.5–5.0)
Alkaline Phosphatase: 53 U/L (ref 38–126)
Anion gap: 10 (ref 5–15)
BUN: 33 mg/dL — ABNORMAL HIGH (ref 8–23)
CO2: 26 mmol/L (ref 22–32)
Calcium: 8.5 mg/dL — ABNORMAL LOW (ref 8.9–10.3)
Chloride: 103 mmol/L (ref 98–111)
Creatinine, Ser: 0.81 mg/dL (ref 0.61–1.24)
GFR calc Af Amer: >60 mL/min (ref >60)
GFR calc non Af Amer: >60 mL/min (ref >60)
Glucose, Bld: 329 mg/dL — ABNORMAL HIGH (ref 70–99)
Potassium: 4.8 mmol/L (ref 3.5–5.1)
Sodium: 139 mmol/L (ref 135–145)
Total Bilirubin: 1.6 mg/dL — ABNORMAL HIGH (ref 0.3–1.2)
Total Protein: 5.7 g/dL — ABNORMAL LOW (ref 6.5–8.1)

## 2020-07-20 LAB — GLUCOSE, CAPILLARY
Glucose-Capillary: 302 mg/dL — ABNORMAL HIGH (ref 70–99)
Glucose-Capillary: 409 mg/dL — ABNORMAL HIGH (ref 70–99)
Glucose-Capillary: 324 mg/dL — ABNORMAL HIGH (ref 70–99)
Glucose-Capillary: 358 mg/dL — ABNORMAL HIGH (ref 70–99)

## 2020-07-20 LAB — C-REACTIVE PROTEIN: CRP: 2 mg/dL — ABNORMAL HIGH (ref <1.0)

## 2020-07-20 LAB — D-DIMER, QUANTITATIVE: D-Dimer, Quant: 5.82 ug/mL-FEU — ABNORMAL HIGH (ref 0.00–0.50)

## 2020-07-20 MED ORDER — INSULIN ASPART 100 UNIT/ML ~~LOC~~ SOLN
20.0000 [IU] | Freq: Once | SUBCUTANEOUS | Status: AC
  Administered 2020-07-20: 20 [IU] via SUBCUTANEOUS

## 2020-07-20 MED ORDER — INSULIN ASPART 100 UNIT/ML ~~LOC~~ SOLN
0.0000 [IU] | Freq: Three times a day (TID) | SUBCUTANEOUS | Status: DC
  Administered 2020-07-20 – 2020-07-21 (×2): 15 [IU] via SUBCUTANEOUS
  Administered 2020-07-21: 20 [IU] via SUBCUTANEOUS
  Administered 2020-07-21: 11 [IU] via SUBCUTANEOUS
  Administered 2020-07-22: 15 [IU] via SUBCUTANEOUS
  Administered 2020-07-22: 4 [IU] via SUBCUTANEOUS
  Administered 2020-07-22: 20 [IU] via SUBCUTANEOUS
  Administered 2020-07-23: 15 [IU] via SUBCUTANEOUS
  Administered 2020-07-23: 20 [IU] via SUBCUTANEOUS
  Administered 2020-07-23: 4 [IU] via SUBCUTANEOUS

## 2020-07-20 MED ORDER — INSULIN DETEMIR 100 UNIT/ML ~~LOC~~ SOLN
40.0000 [IU] | Freq: Every day | SUBCUTANEOUS | Status: DC
  Administered 2020-07-20 – 2020-07-21 (×2): 40 [IU] via SUBCUTANEOUS
  Filled 2020-07-20 (×2): qty 0.4

## 2020-07-20 NOTE — Plan of Care (Signed)
°  Problem: Activity: Goal: Risk for activity intolerance will decrease Outcome: Progressing   Problem: Nutrition: Goal: Adequate nutrition will be maintained Outcome: Progressing   Problem: Education: Goal: Knowledge of risk factors and measures for prevention of condition will improve Outcome: Progressing   Problem: Coping: Goal: Psychosocial and spiritual needs will be supported Outcome: Progressing   Problem: Respiratory: Goal: Complications related to the disease process, condition or treatment will be avoided or minimized Outcome: Progressing

## 2020-07-20 NOTE — Plan of Care (Signed)
  Problem: Coping: Goal: Level of anxiety will decrease Outcome: Progressing   Problem: Safety: Goal: Ability to remain free from injury will improve Outcome: Progressing   Problem: Education: Goal: Knowledge of risk factors and measures for prevention of condition will improve Outcome: Progressing

## 2020-07-20 NOTE — Progress Notes (Signed)
PROGRESS NOTE    Jonathan Shelton  [id number]:[id number] DOB: [birth year] DOA: 07/16/2020 PCP: [name], DO    Brief Narrative:  69 year old gentleman with history of insulin-dependent type 2 diabetes, BPH, diagnosed with COVID-19 on 8/24 presented to emergency room on 9/1 with progressively worsening shortness of breath despite conservative management at home.  Lethargic and poor oral intake. In the emergency room, afebrile.  82% on room air.  Needed 6 L of oxygen.  Bilateral infiltrates left more than right.  CRP 15.9.  Procalcitonin normal.  D-dimer 4.59.   Assessment & Plan:   Active Problems:   Type 2 diabetes mellitus with other specified complication (HCC)   Benign prostatic hyperplasia with incomplete bladder emptying   Obesity (BMI 30.0-34.9)   Acute respiratory disease due to COVID-19 virus  Acute hypoxemic respiratory failure due to COVID-19 pneumonia: Continue to monitor due to significant symptoms  chest physiotherapy, incentive spirometry, deep breathing exercises, sputum induction, mucolytic's and bronchodilators. Supplemental oxygen to keep saturations more than 85%. Covid directed therapy with , steroids, high-dose IV Solu-Medrol remdesivir, day 5/5 Baricitinib, day 4/14 or until hospital discharge Due to severity of symptoms, patient will need daily inflammatory markers, chest x-rays, liver function test to monitor and direct COVID-19 therapies. CT angiogram was done that is negative for pulmonary embolism, shows extensive bilateral infiltrates. CRP trending down. D-dimer trending up, CTA was -2 days ago.  On Lovenox prophylaxis.  Symptomatically improving.  If more oxygen requirement or dramatic increase in D-dimer, will repeat CTA of the chest.  COVID-19 Labs  Recent Labs    07/18/20 0439 07/19/20 0455 07/20/20 0540  DDIMER 3.57* 3.19* 5.82*  CRP 7.9* 4.3* 2.0*    Lab Results  Component Value Date   SARSCOV2NAA NEGATIVE 03/27/2020    SARSCOV2NAA NEGATIVE 02/04/2020   [place] Not Detected 01/15/2020   [place] Not Detected 10/25/2019   SpO2: 90 % O2 Flow Rate (L/min): 10 L/min FiO2 (%): 100 %  Type 2 diabetes on insulin at home: On increased dose of insulin with added to steroids.  We will continue to titrate. Blood sugars are elevated, change Levemir from 25 to 40 units at night.  Hyperlipidemia: On a statin.  DVT prophylaxis: Lovenox subcu   Code Status: Full code Family Communication: wife on the phone 9/4 Disposition Plan: Status is: Inpatient  Remains inpatient appropriate because:Inpatient level of care appropriate due to severity of illness   Dispo: The patient is from: Home              Anticipated d/c is to: Home              Anticipated d/c date is: When oxygen requirement improves.              Patient currently is not medically stable to d/c.         Consultants:   None  Procedures:   None  Antimicrobials:  Anti-infectives (From admission, onward)   Start     Dose/Rate Route Frequency Ordered Stop   07/17/20 1000  remdesivir 100 mg in sodium chloride 0.9 % 100 mL IVPB       "Followed by" Linked Group Details   100 mg 200 mL/hr over 30 Minutes Intravenous Daily 07/16/20 1444 07/20/20 1054   07/16/20 1500  remdesivir 200 mg in sodium chloride 0.9% 250 mL IVPB       "Followed by" Linked Group Details   200 mg 580 mL/hr over 30 Minutes Intravenous Once 07/16/20 1444 07/16/20  1613   07/16/20 1445  cefTRIAXone (ROCEPHIN) 1 g in sodium chloride 0.9 % 100 mL IVPB  Status:  Discontinued        1 g 200 mL/hr over 30 Minutes Intravenous  Once 07/16/20 1438 07/16/20 1531   07/16/20 1445  azithromycin (ZITHROMAX) 500 mg in sodium chloride 0.9 % 250 mL IVPB  Status:  Discontinued        500 mg 250 mL/hr over 60 Minutes Intravenous  Once 07/16/20 1438 07/16/20 1531         Subjective: Patient seen and examined.  No overnight events.  Still on 10 L of oxygen.  He is very  motivated for breathing exercises and was able to take more air inside and he was very excited about it.   Objective: Vitals:   07/19/20 1745 07/19/20 2126 07/20/20 0426 07/20/20 1028  BP:  124/66 (!) 112/56   Pulse: 76 76 64 87  Resp:  20 19   Temp:  97.8 F (36.6 C) 97.6 F (36.4 C)   TempSrc:  Oral Oral   SpO2: 90% 96% 92% 90%  Weight:   107.5 kg   Height:        Intake/Output Summary (Last 24 hours) at 07/20/2020 1102 Last data filed at 07/20/2020 0645 Gross per 24 hour  Intake 560 ml  Output 1150 ml  Net -590 ml   Filed Weights   07/16/20 1644 07/20/20 0426  Weight: 108 kg 107.5 kg    Examination:  Physical Exam Constitutional:      Appearance: Normal appearance.     Comments: In mild distress due to breathing and coughing. Anxious.  On 10 L of oxygen.  HENT:     Head: Atraumatic.  Cardiovascular:     Rate and Rhythm: Regular rhythm.     Heart sounds: Normal heart sounds.  Pulmonary:     Comments: Mostly equal air entry, bilaterally poor air entry with no added sounds. No wheezing or crackles. Neurological:     General: No focal deficit present.     Mental Status: He is oriented to person, place, and time.       Data Reviewed: I have personally reviewed following labs and imaging studies  CBC: Recent Labs  Lab 07/16/20 1358 07/17/20 0539 07/18/20 0439 07/19/20 0455 07/20/20 0540  WBC 6.5 4.1 6.6 8.4 6.9  NEUTROABS 5.2 3.2 4.8 7.0 6.1  HGB 16.0 14.2 14.6 15.1 14.5  HCT 47.6 41.7 43.2 45.5 43.5  MCV 88.0 89.5 89.1 89.6 89.0  PLT 200 193 235 269 132   Basic Metabolic Panel: Recent Labs  Lab 07/16/20 1358 07/17/20 0539 07/18/20 0439 07/19/20 0455 07/20/20 0540  NA 132* 134* 135 134* 139  K 4.1 4.3 4.7 4.6 4.8  CL 96* 103 104 102 103  CO2 18* 16* 19* 23 26  GLUCOSE 130* 210* 276* 343* 329*  BUN 24* 30* 30* 31* 33*  CREATININE 0.97 0.77 0.80 0.86 0.81  CALCIUM 9.5 8.4* 8.2* 8.5* 8.5*   GFR: Estimated Creatinine Clearance: 107.2 mL/min  (by C-G formula based on SCr of 0.81 mg/dL). Liver Function Tests: Recent Labs  Lab 07/16/20 1358 07/17/20 0539 07/18/20 0439 07/19/20 0455 07/20/20 0540  AST 61* 46* 40 46* 40  ALT 38 34 36 50* 48*  ALKPHOS 51 47 46 52 53  BILITOT 1.2 0.8 0.8 1.0 1.6*  PROT 7.9 6.5 6.2* 6.4* 5.7*  ALBUMIN 3.2* 2.6* 2.6* 2.7* 2.7*   No results for input(s): LIPASE, AMYLASE in the  last 168 hours. No results for input(s): AMMONIA in the last 168 hours. Coagulation Profile: No results for input(s): INR, PROTIME in the last 168 hours. Cardiac Enzymes: No results for input(s): CKTOTAL, CKMB, CKMBINDEX, TROPONINI in the last 168 hours. BNP (last 3 results) No results for input(s): PROBNP in the last 8760 hours. HbA1C: No results for input(s): HGBA1C in the last 72 hours. CBG: Recent Labs  Lab 07/18/20 2015 07/19/20 1042 07/19/20 1620 07/19/20 2123 07/20/20 0900  GLUCAP 415* 288* 345* 257* 302*   Lipid Profile: No results for input(s): CHOL, HDL, LDLCALC, TRIG, CHOLHDL, LDLDIRECT in the last 72 hours. Thyroid Function Tests: No results for input(s): TSH, T4TOTAL, FREET4, T3FREE, THYROIDAB in the last 72 hours. Anemia Panel: No results for input(s): VITAMINB12, FOLATE, FERRITIN, TIBC, IRON, RETICCTPCT in the last 72 hours. Sepsis Labs: Recent Labs  Lab 07/16/20 1358  PROCALCITON <0.10  LATICACIDVEN 1.3    Recent Results (from the past 240 hour(s))  Blood Culture (routine x 2)     Status: None (Preliminary result)   Collection Time: 07/16/20  1:58 PM   Specimen: BLOOD RIGHT FOREARM  Result Value Ref Range Status   Specimen Description   Final    BLOOD RIGHT FOREARM Performed at [facility] [address], [place], [place] [address]    Special Requests   Final    BOTTLES DRAWN AEROBIC AND ANAEROBIC Blood Culture adequate volume Performed at [facility] [address] [place], [place] [address]    Culture   Final    NO GROWTH 4  DAYS Performed at [facility], [place] [address], [place], [place] [address]    Report Status PENDING  Incomplete  Blood Culture (routine x 2)     Status: None (Preliminary result)   Collection Time: 07/16/20  1:58 PM   Specimen: BLOOD  Result Value Ref Range Status   Specimen Description   Final    BLOOD RIGHT ANTECUBITAL Performed at [facility] [address], [place], [place] [address]    Special Requests   Final    BOTTLES DRAWN AEROBIC AND ANAEROBIC Blood Culture adequate volume Performed at [facility] [address], [place], [place] [address]    Culture   Final    NO GROWTH 4 DAYS Performed at [facility], [place] [address], [place], [place] [address]    Report Status PENDING  Incomplete         Radiology Studies: No results found.      Scheduled Meds: . vitamin C  500 mg Oral Daily  . aspirin  81 mg Oral Daily  . atorvastatin  10 mg Oral q1800  . B-complex with vitamin C  1 tablet Oral Daily  . baricitinib  4 mg Oral Daily  . docusate sodium  100 mg Oral BID  . enoxaparin (LOVENOX) injection  0.5 mg/kg Subcutaneous Q12H  . feeding supplement (ENSURE ENLIVE)  237 mL Oral BID BM  . finasteride  5 mg Oral Daily  . insulin aspart  0-15 Units Subcutaneous TID WC  . insulin aspart  0-5 Units Subcutaneous QHS  . insulin detemir  40 Units Subcutaneous QHS  . irbesartan  75 mg Oral Daily  . linagliptin  5 mg Oral Daily  . methylPREDNISolone (SOLU-MEDROL) injection  80 mg Intravenous Q12H  . sodium chloride flush  3 mL Intravenous Q12H  . tamsulosin  0.4 mg Oral QPC supper  . vitamin E  400 Units Oral Daily  . zinc sulfate  220 mg Oral Daily   Continuous Infusions:    LOS: 4 days    Time spent: 30 minutes    Barb Merino, MD [facility] Pager [phone]

## 2020-07-21 LAB — CULTURE, BLOOD (ROUTINE X 2)
Culture: NO GROWTH
Culture: NO GROWTH
Special Requests: ADEQUATE
Special Requests: ADEQUATE

## 2020-07-21 LAB — COMPREHENSIVE METABOLIC PANEL
ALT: 61 U/L — ABNORMAL HIGH (ref 0–44)
AST: 41 U/L (ref 15–41)
Albumin: 2.6 g/dL — ABNORMAL LOW (ref 3.5–5.0)
Alkaline Phosphatase: 54 U/L (ref 38–126)
Anion gap: 10 (ref 5–15)
BUN: 25 mg/dL — ABNORMAL HIGH (ref 8–23)
CO2: 23 mmol/L (ref 22–32)
Calcium: 8.1 mg/dL — ABNORMAL LOW (ref 8.9–10.3)
Chloride: 99 mmol/L (ref 98–111)
Creatinine, Ser: 0.69 mg/dL (ref 0.61–1.24)
GFR calc Af Amer: >60 mL/min (ref >60)
GFR calc non Af Amer: >60 mL/min (ref >60)
Glucose, Bld: 287 mg/dL — ABNORMAL HIGH (ref 70–99)
Potassium: 4.2 mmol/L (ref 3.5–5.1)
Sodium: 132 mmol/L — ABNORMAL LOW (ref 135–145)
Total Bilirubin: 1.6 mg/dL — ABNORMAL HIGH (ref 0.3–1.2)
Total Protein: 5.7 g/dL — ABNORMAL LOW (ref 6.5–8.1)

## 2020-07-21 LAB — CBC WITH DIFFERENTIAL/PLATELET
Abs Immature Granulocytes: 0.06 10*3/uL (ref 0.00–0.07)
Basophils Absolute: 0 10*3/uL (ref 0.0–0.1)
Basophils Relative: 0 %
Eosinophils Absolute: 0 10*3/uL (ref 0.0–0.5)
Eosinophils Relative: 0 %
HCT: 44.6 % (ref 39.0–52.0)
Hemoglobin: 15 g/dL (ref 13.0–17.0)
Immature Granulocytes: 1 %
Lymphocytes Relative: 4 %
Lymphs Abs: 0.3 10*3/uL — ABNORMAL LOW (ref 0.7–4.0)
MCH: 29.7 pg (ref 26.0–34.0)
MCHC: 33.6 g/dL (ref 30.0–36.0)
MCV: 88.3 fL (ref 80.0–100.0)
Monocytes Absolute: 0.3 10*3/uL (ref 0.1–1.0)
Monocytes Relative: 4 %
Neutro Abs: 7.8 10*3/uL — ABNORMAL HIGH (ref 1.7–7.7)
Neutrophils Relative %: 91 %
Platelets: 256 10*3/uL (ref 150–400)
RBC: 5.05 MIL/uL (ref 4.22–5.81)
RDW: 13.8 % (ref 11.5–15.5)
WBC: 8.5 10*3/uL (ref 4.0–10.5)
nRBC: 0 % (ref 0.0–0.2)

## 2020-07-21 LAB — GLUCOSE, CAPILLARY
Glucose-Capillary: 253 mg/dL — ABNORMAL HIGH (ref 70–99)
Glucose-Capillary: 381 mg/dL — ABNORMAL HIGH (ref 70–99)
Glucose-Capillary: 319 mg/dL — ABNORMAL HIGH (ref 70–99)
Glucose-Capillary: 309 mg/dL — ABNORMAL HIGH (ref 70–99)

## 2020-07-21 LAB — D-DIMER, QUANTITATIVE: D-Dimer, Quant: 5.03 ug/mL-FEU — ABNORMAL HIGH (ref 0.00–0.50)

## 2020-07-21 LAB — C-REACTIVE PROTEIN: CRP: 1.2 mg/dL — ABNORMAL HIGH (ref <1.0)

## 2020-07-21 MED ORDER — SALINE SPRAY 0.65 % NA SOLN
1.0000 | NASAL | Status: DC | PRN
  Administered 2020-07-21: 1 via NASAL
  Filled 2020-07-21: qty 44

## 2020-07-21 MED ORDER — METHYLPREDNISOLONE SODIUM SUCC 125 MG IJ SOLR
40.0000 mg | Freq: Two times a day (BID) | INTRAMUSCULAR | Status: DC
  Administered 2020-07-21 – 2020-07-23 (×5): 40 mg via INTRAVENOUS
  Filled 2020-07-21 (×5): qty 2

## 2020-07-21 NOTE — Progress Notes (Signed)
PROGRESS NOTE    Jonathan Shelton  DVV:[id number] DOB: 1951-04-18 DOA: 07/16/2020 PCP: [name], DO    Brief Narrative:  69 year old gentleman with history of insulin-dependent type 2 diabetes, BPH, diagnosed with COVID-19 on 8/24 presented to emergency room on 9/1 with progressively worsening shortness of breath despite conservative management at home.  Lethargic and poor oral intake. In the emergency room, afebrile.  82% on room air.  Needed 6 L of oxygen.  Bilateral infiltrates left more than right.  CRP 15.9.  Procalcitonin normal.  D-dimer 4.59.   Assessment & Plan:   Active Problems:   Type 2 diabetes mellitus with other specified complication (HCC)   Benign prostatic hyperplasia with incomplete bladder emptying   Obesity (BMI 30.0-34.9)   Acute respiratory disease due to COVID-19 virus  Acute hypoxemic respiratory failure due to COVID-19 pneumonia: Continue to monitor due to significant symptoms  chest physiotherapy, incentive spirometry, deep breathing exercises, sputum induction, mucolytic's and bronchodilators. Supplemental oxygen to keep saturations more than 85%. Covid directed therapy with , steroids, high-dose IV Solu-Medrol remdesivir, finished remdesivir therapy. Baricitinib, day 5 /14 or until hospital discharge Due to severity of symptoms, patient will need daily inflammatory markers, chest x-rays, liver function test to monitor and direct COVID-19 therapies. CT angiogram was done that is negative for pulmonary embolism, shows extensive bilateral infiltrates. CRP trending down. D-dimer trending up, CTA was -2 days ago.  On Lovenox prophylaxis.  Symptomatically improving.  COVID-19 Labs  Recent Labs    07/19/20 0455 07/20/20 0540 07/21/20 0537  DDIMER 3.19* 5.82* 5.03*  CRP 4.3* 2.0* 1.2*    Lab Results  Component Value Date   SARSCOV2NAA NEGATIVE 03/27/2020   [place] NEGATIVE 02/04/2020   SARSCOV2NAA Not Detected 01/15/2020    [place] Not Detected 10/25/2019   SpO2: 90 % O2 Flow Rate (L/min): 9 L/min FiO2 (%): 100 %  Type 2 diabetes on insulin at home: On increased dose of insulin with added to steroids.  We will continue to titrate. Blood sugars are elevated, change Levemir from 25 to 40 units at night. We will cut down steroid dose to half today.  Hyperlipidemia: On a statin.  DVT prophylaxis: Lovenox subcu   Code Status: Full code Family Communication: wife on the phone 9/4 Disposition Plan: Status is: Inpatient  Remains inpatient appropriate because:Inpatient level of care appropriate due to severity of illness   Dispo: The patient is from: Home              Anticipated d/c is to: Home              Anticipated d/c date is: When oxygen requirement improves.              Patient currently is not medically stable to d/c.         Consultants:   None  Procedures:   None  Antimicrobials:  Anti-infectives (From admission, onward)   Start     Dose/Rate Route Frequency Ordered Stop   07/17/20 1000  remdesivir 100 mg in sodium chloride 0.9 % 100 mL IVPB       "Followed by" Linked Group Details   100 mg 200 mL/hr over 30 Minutes Intravenous Daily 07/16/20 1444 07/20/20 1054   07/16/20 1500  remdesivir 200 mg in sodium chloride 0.9% 250 mL IVPB       "Followed by" Linked Group Details   200 mg 580 mL/hr over 30 Minutes Intravenous Once 07/16/20 1444 07/16/20 1613   07/16/20 1445  cefTRIAXone (ROCEPHIN) 1 g in sodium chloride 0.9 % 100 mL IVPB  Status:  Discontinued        1 g 200 mL/hr over 30 Minutes Intravenous  Once 07/16/20 1438 07/16/20 1531   07/16/20 1445  azithromycin (ZITHROMAX) 500 mg in sodium chloride 0.9 % 250 mL IVPB  Status:  Discontinued        500 mg 250 mL/hr over 60 Minutes Intravenous  Once 07/16/20 1438 07/16/20 1531         Subjective: Patient seen and examined.  No overnight events.  Still on 9 to 10 L of oxygen.  Feels very weak and wants to take shower  and mobilize.  Nose is stuffed.   Objective: Vitals:   07/21/20 0341 07/21/20 0342 07/21/20 0621 07/21/20 1011  BP: (!) 104/57  136/82   Pulse: 73  77   Resp: 20  20   Temp:  98.2 F (36.8 C) 97.9 F (36.6 C)   TempSrc:  Oral    SpO2: (!) 86% 90% 93% 90%  Weight:      Height:        Intake/Output Summary (Last 24 hours) at 07/21/2020 1034 Last data filed at 07/21/2020 0706 Gross per 24 hour  Intake 600 ml  Output 1401 ml  Net -801 ml   Filed Weights   07/16/20 1644 07/20/20 0426  Weight: 108 kg 107.5 kg    Examination:  Physical Exam Constitutional:      Appearance: Normal appearance.     Comments: In mild distress due to breathing and coughing. Anxious.  On 10 L of oxygen.  HENT:     Head: Atraumatic.  Cardiovascular:     Rate and Rhythm: Regular rhythm.     Heart sounds: Normal heart sounds.  Pulmonary:     Comments: Mostly equal air entry, bilaterally poor air entry with no added sounds. No wheezing or crackles. Neurological:     General: No focal deficit present.     Mental Status: He is oriented to person, place, and time.       Data Reviewed: I have personally reviewed following labs and imaging studies  CBC: Recent Labs  Lab 07/17/20 0539 07/18/20 0439 07/19/20 0455 07/20/20 0540 07/21/20 0537  WBC 4.1 6.6 8.4 6.9 8.5  NEUTROABS 3.2 4.8 7.0 6.1 7.8*  HGB 14.2 14.6 15.1 14.5 15.0  HCT 41.7 43.2 45.5 43.5 44.6  MCV 89.5 89.1 89.6 89.0 88.3  PLT 193 235 269 245 470   Basic Metabolic Panel: Recent Labs  Lab 07/17/20 0539 07/18/20 0439 07/19/20 0455 07/20/20 0540 07/21/20 0537  NA 134* 135 134* 139 132*  K 4.3 4.7 4.6 4.8 4.2  CL 103 104 102 103 99  CO2 16* 19* 23 26 23   GLUCOSE 210* 276* 343* 329* 287*  BUN 30* 30* 31* 33* 25*  CREATININE 0.77 0.80 0.86 0.81 0.69  CALCIUM 8.4* 8.2* 8.5* 8.5* 8.1*   GFR: Estimated Creatinine Clearance: 108.5 mL/min (by C-G formula based on SCr of 0.69 mg/dL). Liver Function Tests: Recent Labs  Lab  07/17/20 0539 07/18/20 0439 07/19/20 0455 07/20/20 0540 07/21/20 0537  AST 46* 40 46* 40 41  ALT 34 36 50* 48* 61*  ALKPHOS 47 46 52 53 54  BILITOT 0.8 0.8 1.0 1.6* 1.6*  PROT 6.5 6.2* 6.4* 5.7* 5.7*  ALBUMIN 2.6* 2.6* 2.7* 2.7* 2.6*   No results for input(s): LIPASE, AMYLASE in the last 168 hours. No results for input(s): AMMONIA in the last 168  hours. Coagulation Profile: No results for input(s): INR, PROTIME in the last 168 hours. Cardiac Enzymes: No results for input(s): CKTOTAL, CKMB, CKMBINDEX, TROPONINI in the last 168 hours. BNP (last 3 results) No results for input(s): PROBNP in the last 8760 hours. HbA1C: No results for input(s): HGBA1C in the last 72 hours. CBG: Recent Labs  Lab 07/20/20 0900 07/20/20 1218 07/20/20 1838 07/20/20 2053 07/21/20 0758  GLUCAP 302* 409* 324* 358* 253*   Lipid Profile: No results for input(s): CHOL, HDL, LDLCALC, TRIG, CHOLHDL, LDLDIRECT in the last 72 hours. Thyroid Function Tests: No results for input(s): TSH, T4TOTAL, FREET4, T3FREE, THYROIDAB in the last 72 hours. Anemia Panel: No results for input(s): VITAMINB12, FOLATE, FERRITIN, TIBC, IRON, RETICCTPCT in the last 72 hours. Sepsis Labs: Recent Labs  Lab 07/16/20 1358  PROCALCITON <0.10  LATICACIDVEN 1.3    Recent Results (from the past 240 hour(s))  Blood Culture (routine x 2)     Status: None   Collection Time: 07/16/20  1:58 PM   Specimen: BLOOD RIGHT FOREARM  Result Value Ref Range Status   Specimen Description   Final    BLOOD RIGHT FOREARM Performed at [facility] [address] [place], [place] [address]    Special Requests   Final    BOTTLES DRAWN AEROBIC AND ANAEROBIC Blood Culture adequate volume Performed at [facility] [address] [place], [place] [address]    Culture   Final    NO GROWTH 5 DAYS Performed at [facility], [place] [address], [place], [place] [address]    Report Status 07/21/2020  FINAL  Final  Blood Culture (routine x 2)     Status: None   Collection Time: 07/16/20  1:58 PM   Specimen: BLOOD  Result Value Ref Range Status   Specimen Description   Final    BLOOD RIGHT ANTECUBITAL Performed at [facility] [address] [place], [place] [address]    Special Requests   Final    BOTTLES DRAWN AEROBIC AND ANAEROBIC Blood Culture adequate volume Performed at [facility] [address], [place],  [address]    Culture   Final    NO GROWTH 5 DAYS Performed at [facility], [place] [address] [place],  [address]    Report Status 07/21/2020 FINAL  Final         Radiology Studies: No results found.      Scheduled Meds: . vitamin C  500 mg Oral Daily  . aspirin  81 mg Oral Daily  . atorvastatin  10 mg Oral q1800  . B-complex with vitamin C  1 tablet Oral Daily  . baricitinib  4 mg Oral Daily  . docusate sodium  100 mg Oral BID  . enoxaparin (LOVENOX) injection  0.5 mg/kg Subcutaneous Q12H  . feeding supplement (ENSURE ENLIVE)  237 mL Oral BID BM  . finasteride  5 mg Oral Daily  . insulin aspart  0-20 Units Subcutaneous TID WC  . insulin aspart  0-5 Units Subcutaneous QHS  . insulin detemir  40 Units Subcutaneous QHS  . irbesartan  75 mg Oral Daily  . linagliptin  5 mg Oral Daily  . methylPREDNISolone (SOLU-MEDROL) injection  40 mg Intravenous Q12H  . sodium chloride flush  3 mL Intravenous Q12H  . tamsulosin  0.4 mg Oral QPC supper  . vitamin E  400 Units Oral Daily  . zinc sulfate  220 mg Oral Daily  Continuous Infusions:    LOS: 5 days    Time spent: 30 minutes    Barb Merino, MD [facility] Pager [phone]

## 2020-07-22 LAB — GLUCOSE, CAPILLARY
Glucose-Capillary: 195 mg/dL — ABNORMAL HIGH (ref 70–99)
Glucose-Capillary: 318 mg/dL — ABNORMAL HIGH (ref 70–99)
Glucose-Capillary: 365 mg/dL — ABNORMAL HIGH (ref 70–99)
Glucose-Capillary: 379 mg/dL — ABNORMAL HIGH (ref 70–99)

## 2020-07-22 MED ORDER — FUROSEMIDE 10 MG/ML IJ SOLN
40.0000 mg | Freq: Once | INTRAMUSCULAR | Status: AC
  Administered 2020-07-22: 40 mg via INTRAVENOUS
  Filled 2020-07-22: qty 4

## 2020-07-22 MED ORDER — INSULIN DETEMIR 100 UNIT/ML ~~LOC~~ SOLN
45.0000 [IU] | Freq: Every day | SUBCUTANEOUS | Status: DC
  Administered 2020-07-22: 45 [IU] via SUBCUTANEOUS
  Filled 2020-07-22 (×2): qty 0.45

## 2020-07-22 NOTE — Plan of Care (Signed)
Patient up to chair and walking in room most of shift, was able to wean oxygen down to 6 liters HFNC with oxygen saturation in the high 80's to low 90's.

## 2020-07-22 NOTE — Progress Notes (Signed)
Physical Therapy Treatment Patient Details Name: Jonathan Shelton MRN: [id number] DOB: 1951/07/18 Today's Date: 07/22/2020    History of Present Illness Patient is a 69 year old male history of insulin-dependent type 2 diabetes, BPH, diagnosed with COVID-19 on 8/24 presented to emergency room on 9/1 with progressively worsening shortness of breath    PT Comments    The patient  In bed on 8 L at rest , SPO2 90 %, Spo2 pleth erratic while ambulating with drop ping to 835. Remained in low 80's while ambulating second time. Patient gradually returned to 88% with resting and IS( pulls 800). Continue PT.   Follow Up Recommendations  Home health PT     Equipment Recommendations  None recommended by PT    Recommendations for Other Services       Precautions / Restrictions Precautions Precautions: Fall Precaution Comments: monitor vitals Restrictions Weight Bearing Restrictions: No    Mobility  Bed Mobility Overal bed mobility: Needs Assistance Bed Mobility: Supine to Sit     Supine to sit: Supervision     General bed mobility comments: extra time, use of rail  Transfers Overall transfer level: Needs assistance Equipment used: None;Rolling walker (2 wheeled) Transfers: Sit to/from Stand Sit to Stand: Supervision         General transfer comment: supervision to stand from EOB, min G to stand  to use urinal. fell back onto bed  and stood again with rolling walker  Ambulation/Gait Ambulation/Gait assistance: Supervision;Min guard Gait Distance (Feet): 60 Feet (then 30) Assistive device: Rolling walker (2 wheeled) Gait Pattern/deviations: Step-to pattern;Step-through pattern;Decreased weight shift to left;Shuffle Gait velocity: decreased   General Gait Details: able to turn Rw in small spaces. patient on 8 L with SPO2 gradually  dropping  to 78% during second walk.   Stairs             Wheelchair Mobility    Modified Rankin (Stroke Patients Only)        Balance Overall balance assessment: Needs assistance Sitting-balance support: Feet supported Sitting balance-Leahy Scale: Good     Standing balance support: Single extremity supported;Bilateral upper extremity supported;During functional activity;No upper extremity supported Standing balance-Leahy Scale: Fair Standing balance comment: patient can stand to use urinal  With min guard without UEs to perform oral care, dynamically benefits from B UE support                            Cognition Arousal/Alertness: Awake/alert Behavior During Therapy: Flat affect Overall Cognitive Status: Impaired/Different from baseline Area of Impairment: Problem solving                             Problem Solving: Slow processing General Comments: , patient requires increased time for responses and to initiate tasks when given directions      Exercises General Exercises - Lower Extremity Ankle Circles/Pumps: AROM;Both;10 reps;Seated Long Arc Quad: AROM;Seated;Both;10 reps Hip ABduction/ADduction: AROM;Seated;Both;10 reps    General Comments General comments (skin integrity, edema, etc.): patient desat to 87-88% after standing g/h in bathroom on 10L HFNC, cues for pursed lip breathing and recovers within 15-20 seconds to 90-91%       Pertinent Vitals/Pain Pain Assessment: No/denies pain    Home Living                      Prior Function  PT Goals (current goals can now be found in the care plan section) Acute Rehab PT Goals Patient Stated Goal: feel better Progress towards PT goals: Progressing toward goals    Frequency    Min 3X/week      PT Plan Current plan remains appropriate    Co-evaluation              AM-PAC PT "6 Clicks" Mobility   Outcome Measure  Help needed turning from your back to your side while in a flat bed without using bedrails?: A Little Help needed moving from lying on your back to sitting on the side of a  flat bed without using bedrails?: A Little Help needed moving to and from a bed to a chair (including a wheelchair)?: A Little Help needed standing up from a chair using your arms (e.g., wheelchair or bedside chair)?: A Little Help needed to walk in hospital room?: A Little Help needed climbing 3-5 steps with a railing? : A Lot 6 Click Score: 17    End of Session Equipment Utilized During Treatment: Oxygen Activity Tolerance: Patient tolerated treatment well Patient left: in bed;with call bell/phone within reach Nurse Communication: Mobility status PT Visit Diagnosis: Unsteadiness on feet (R26.81);Difficulty in walking, not elsewhere classified (R26.2)     Time: 1224-8250 PT Time Calculation (min) (ACUTE ONLY): 31 min  Charges:  $Gait Training: 8-22 mins $Therapeutic Exercise: 8-22 mins                     Tresa Endo PT Acute Rehabilitation Services Pager [phone] Office [phone]    Claretha Cooper 07/22/2020, 4:45 PM

## 2020-07-22 NOTE — Progress Notes (Signed)
PROGRESS NOTE    Jonathan Shelton  MGN:[id number] DOB: August 12, 1951 DOA: 07/16/2020 PCP: [name], DO    Brief Narrative:  69 year old gentleman with history of insulin-dependent type 2 diabetes, BPH, diagnosed with COVID-19 on 8/24 presented to emergency room on 9/1 with progressively worsening shortness of breath despite conservative management at home.  Lethargic and poor oral intake. In the emergency room, afebrile.  82% on room air.  Needed 6 L of oxygen.  Bilateral infiltrates left more than right.  CRP 15.9.  Procalcitonin normal.  D-dimer 4.59.   Assessment & Plan:   Active Problems:   Type 2 diabetes mellitus with other specified complication (HCC)   Benign prostatic hyperplasia with incomplete bladder emptying   Obesity (BMI 30.0-34.9)   Acute respiratory disease due to COVID-19 virus  Acute hypoxemic respiratory failure due to COVID-19 pneumonia: Continue to monitor due to significant symptoms  chest physiotherapy, incentive spirometry, deep breathing exercises, sputum induction, mucolytic's and bronchodilators. Supplemental oxygen to keep saturations more than 85%. Covid directed therapy with , steroids, high-dose IV Solu-Medrol remdesivir, finished remdesivir therapy. Baricitinib, day 6/14 or until hospital discharge We will try Lasix injection. Due to severity of symptoms, patient will need daily inflammatory markers, chest x-rays, liver function test to monitor and direct COVID-19 therapies. CT angiogram was done that is negative for pulmonary embolism, shows extensive bilateral infiltrates. CRP trending down. D-dimer trending up, CTA was -2 days ago.  On Lovenox prophylaxis.  Symptomatically improving.  Recheck inflammatory markers and D-dimer tomorrow morning. COVID-19 Labs  Recent Labs    07/20/20 0540 07/21/20 0537  DDIMER 5.82* 5.03*  CRP 2.0* 1.2*    Lab Results  Component Value Date   SARSCOV2NAA NEGATIVE 03/27/2020   [place]  NEGATIVE 02/04/2020   SARSCOV2NAA Not Detected 01/15/2020   [place] Not Detected 10/25/2019   SpO2: (!) 88 % O2 Flow Rate (L/min): 9 L/min FiO2 (%): 100 %  Type 2 diabetes on insulin at home: Blood sugars are still elevated.   Increase Levemir to 45 units at night.   On high-dose sliding scale insulin.    Hyperlipidemia: On a statin.  DVT prophylaxis: Lovenox subcu   Code Status: Full code Family Communication: wife on the phone 9/5.  Patient is communicating.  I offered him with her she will call his family, he says is fine and will communicate himself. Disposition Plan: Status is: Inpatient  Remains inpatient appropriate because:Inpatient level of care appropriate due to severity of illness   Dispo: The patient is from: Home              Anticipated d/c is to: Home              Anticipated d/c date is: When oxygen requirement improves.              Patient currently is not medically stable to d/c.         Consultants:   None  Procedures:   None  Antimicrobials:  Anti-infectives (From admission, onward)   Start     Dose/Rate Route Frequency Ordered Stop   07/17/20 1000  remdesivir 100 mg in sodium chloride 0.9 % 100 mL IVPB       "Followed by" Linked Group Details   100 mg 200 mL/hr over 30 Minutes Intravenous Daily 07/16/20 1444 07/20/20 1054   07/16/20 1500  remdesivir 200 mg in sodium chloride 0.9% 250 mL IVPB       "Followed by" Linked Group Details  200 mg 580 mL/hr over 30 Minutes Intravenous Once 07/16/20 1444 07/16/20 1613   07/16/20 1445  cefTRIAXone (ROCEPHIN) 1 g in sodium chloride 0.9 % 100 mL IVPB  Status:  Discontinued        1 g 200 mL/hr over 30 Minutes Intravenous  Once 07/16/20 1438 07/16/20 1531   07/16/20 1445  azithromycin (ZITHROMAX) 500 mg in sodium chloride 0.9 % 250 mL IVPB  Status:  Discontinued        500 mg 250 mL/hr over 60 Minutes Intravenous  Once 07/16/20 1438 07/16/20 1531         Subjective: Patient seen and  examined.  He just frustrated being in the hospital and not improving his oxygen level.  No other overnight events.  Looks fairly comfortable on 9 L high flow nasal cannula.  Was eating breakfast with no distress.   Objective: Vitals:   07/21/20 1634 07/21/20 2153 07/22/20 0557 07/22/20 0747  BP:  127/73 133/85   Pulse:  72 78   Resp:  18    Temp:  97.9 F (36.6 C)    TempSrc:  Oral    SpO2: 93% 92% 93% (!) 88%  Weight:      Height:        Intake/Output Summary (Last 24 hours) at 07/22/2020 1114 Last data filed at 07/22/2020 0757 Gross per 24 hour  Intake --  Output 1100 ml  Net -1100 ml   Filed Weights   07/16/20 1644 07/20/20 0426  Weight: 108 kg 107.5 kg    Examination:  Physical Exam Constitutional:      Appearance: Normal appearance.     Comments: Looks fairly comfortable at rest.  HENT:     Head: Atraumatic.  Cardiovascular:     Rate and Rhythm: Regular rhythm.     Heart sounds: Normal heart sounds.  Pulmonary:     Comments: He does have some expiratory crackles and conducted upper airway sounds. Neurological:     General: No focal deficit present.     Mental Status: He is oriented to person, place, and time.       Data Reviewed: I have personally reviewed following labs and imaging studies  CBC: Recent Labs  Lab 07/17/20 0539 07/18/20 0439 07/19/20 0455 07/20/20 0540 07/21/20 0537  WBC 4.1 6.6 8.4 6.9 8.5  NEUTROABS 3.2 4.8 7.0 6.1 7.8*  HGB 14.2 14.6 15.1 14.5 15.0  HCT 41.7 43.2 45.5 43.5 44.6  MCV 89.5 89.1 89.6 89.0 88.3  PLT 193 235 269 245 161   Basic Metabolic Panel: Recent Labs  Lab 07/17/20 0539 07/18/20 0439 07/19/20 0455 07/20/20 0540 07/21/20 0537  NA 134* 135 134* 139 132*  K 4.3 4.7 4.6 4.8 4.2  CL 103 104 102 103 99  CO2 16* 19* 23 26 23   GLUCOSE 210* 276* 343* 329* 287*  BUN 30* 30* 31* 33* 25*  CREATININE 0.77 0.80 0.86 0.81 0.69  CALCIUM 8.4* 8.2* 8.5* 8.5* 8.1*   GFR: Estimated Creatinine Clearance: 108.5  mL/min (by C-G formula based on SCr of 0.69 mg/dL). Liver Function Tests: Recent Labs  Lab 07/17/20 0539 07/18/20 0439 07/19/20 0455 07/20/20 0540 07/21/20 0537  AST 46* 40 46* 40 41  ALT 34 36 50* 48* 61*  ALKPHOS 47 46 52 53 54  BILITOT 0.8 0.8 1.0 1.6* 1.6*  PROT 6.5 6.2* 6.4* 5.7* 5.7*  ALBUMIN 2.6* 2.6* 2.7* 2.7* 2.6*   No results for input(s): LIPASE, AMYLASE in the last 168 hours. No results  for input(s): AMMONIA in the last 168 hours. Coagulation Profile: No results for input(s): INR, PROTIME in the last 168 hours. Cardiac Enzymes: No results for input(s): CKTOTAL, CKMB, CKMBINDEX, TROPONINI in the last 168 hours. BNP (last 3 results) No results for input(s): PROBNP in the last 8760 hours. HbA1C: No results for input(s): HGBA1C in the last 72 hours. CBG: Recent Labs  Lab 07/21/20 0758 07/21/20 1151 07/21/20 1726 07/21/20 2149 07/22/20 0753  GLUCAP 253* 381* 319* 309* 195*   Lipid Profile: No results for input(s): CHOL, HDL, LDLCALC, TRIG, CHOLHDL, LDLDIRECT in the last 72 hours. Thyroid Function Tests: No results for input(s): TSH, T4TOTAL, FREET4, T3FREE, THYROIDAB in the last 72 hours. Anemia Panel: No results for input(s): VITAMINB12, FOLATE, FERRITIN, TIBC, IRON, RETICCTPCT in the last 72 hours. Sepsis Labs: Recent Labs  Lab 07/16/20 1358  PROCALCITON <0.10  LATICACIDVEN 1.3    Recent Results (from the past 240 hour(s))  Blood Culture (routine x 2)     Status: None   Collection Time: 07/16/20  1:58 PM   Specimen: BLOOD RIGHT FOREARM  Result Value Ref Range Status   Specimen Description   Final    BLOOD RIGHT FOREARM Performed at [facility] [address] [place], [place] [address]    Special Requests   Final    BOTTLES DRAWN AEROBIC AND ANAEROBIC Blood Culture adequate volume Performed at [facility] [address], [place], [place] [address]    Culture   Final    NO GROWTH 5 DAYS Performed at  [facility], [place] [address] [place], [place] [address]    Report Status 07/21/2020 FINAL  Final  Blood Culture (routine x 2)     Status: None   Collection Time: 07/16/20  1:58 PM   Specimen: BLOOD  Result Value Ref Range Status   Specimen Description   Final    BLOOD RIGHT ANTECUBITAL Performed at [facility] [address], [place], [place] [address]    Special Requests   Final    BOTTLES DRAWN AEROBIC AND ANAEROBIC Blood Culture adequate volume Performed at [facility] [address] [place], [place] [address]    Culture   Final    NO GROWTH 5 DAYS Performed at [facility], [place] [address] [place], [place] [address]    Report Status 07/21/2020 FINAL  Final         Radiology Studies: No results found.      Scheduled Meds: . vitamin C  500 mg Oral Daily  . aspirin  81 mg Oral Daily  . atorvastatin  10 mg Oral q1800  . B-complex with vitamin C  1 tablet Oral Daily  . baricitinib  4 mg Oral Daily  . docusate sodium  100 mg Oral BID  . enoxaparin (LOVENOX) injection  0.5 mg/kg Subcutaneous Q12H  . feeding supplement (ENSURE ENLIVE)  237 mL Oral BID BM  . finasteride  5 mg Oral Daily  . furosemide  40 mg Intravenous Once  . insulin aspart  0-20 Units Subcutaneous TID WC  . insulin aspart  0-5 Units Subcutaneous QHS  . insulin detemir  45 Units Subcutaneous QHS  . irbesartan  75 mg Oral Daily  . linagliptin  5 mg Oral Daily  . methylPREDNISolone (SOLU-MEDROL) injection  40 mg Intravenous Q12H  . sodium chloride flush  3 mL Intravenous Q12H  . tamsulosin  0.4 mg Oral QPC supper  . vitamin E  400 Units Oral Daily  . zinc sulfate  220 mg Oral Daily   Continuous Infusions:    LOS: 6 days    Time spent: 30 minutes    Barb Merino, MD [facility] Pager [phone]

## 2020-07-22 NOTE — Progress Notes (Signed)
Occupational Therapy Progress Note  Patient appears weaker this session than compared to evaluation requiring walker for ambulation to/from bathroom for increased safety. Initially started ambulating to bathroom without AD however patient reaching out for objects to stabilize therefore provided walker for remainder of session. Patient min G assist for safety requiring cues to navigate walker in smaller spaces. Patient min G to stand sink side for oral care with desat to 87% on 10HFNC, with seated rest break and cues for pursed lip breathing patient increase to 90-91% within 15-20 second recovery. Will continue to follow.     07/22/20 1400  OT Visit Information  Last OT Received On 07/22/20  Assistance Needed +1  History of Present Illness Patient is a 69 year old male history of insulin-dependent type 2 diabetes, BPH, diagnosed with COVID-19 on 8/24 presented to emergency room on 9/1 with progressively worsening shortness of breath  Precautions  Precautions Fall  Precaution Comments monitor vitals  Pain Assessment  Pain Assessment No/denies pain  Cognition  Arousal/Alertness Awake/alert  Behavior During Therapy Flat affect  Overall Cognitive Status Impaired/Different from baseline  Area of Impairment Problem solving  Problem Solving Slow processing  General Comments patient knows month/year could not state specific date, patient requires increased time for responses and to initiate tasks when given directions  ADL  Overall ADL's  Needs assistance/impaired  Grooming Oral care;Wash/dry face;Wash/dry hands;Min guard;Standing  Lower Body Bathing Min guard;Sit to/from stand  Lower Body Bathing Details (indicate cue type and reason) standing at EOB patient partially squat to clean buttocks, min G for safety  Toilet Transfer Min guard;Cueing for safety;RW  Toilet Transfer Details (indicate cue type and reason) functional ambulation to/from bathroom and back to EOB, patient initially takes steps  without rolling walker however requiring hand held assistance and pt reaching out for objects to stabilize. provide patient with rolling  walker for further ambulation with improved stability. requires cues for safety navigating walker in small spaces  Functional mobility during ADLs Min guard;Rolling walker;Cueing for safety  General ADL Comments patient moving more slowly/tentatively this session requiring min G for safety  Bed Mobility  General bed mobility comments at EOB upon arrival  Balance  Overall balance assessment Needs assistance  Sitting-balance support Feet supported  Sitting balance-Leahy Scale Good  Standing balance support Single extremity supported;Bilateral upper extremity supported;During functional activity;No upper extremity supported  Standing balance-Leahy Scale Fair  Standing balance comment patient can stand sink side without UEs to perform oral care, dynamically benefits from B UE support  Restrictions  Weight Bearing Restrictions No  Transfers  Overall transfer level Needs assistance  Equipment used None;Rolling walker (2 wheeled)  Transfers Sit to/from Stand  Sit to Stand Supervision  General transfer comment supervision to stand from EOB, min G with rolling walker for ambulation for safety  General Comments  General comments (skin integrity, edema, etc.) patient desat to 87-88% after standing g/h in bathroom on 10L HFNC, cues for pursed lip breathing and recovers within 15-20 seconds to 90-91%   OT - End of Session  Equipment Utilized During Treatment Rolling walker;Oxygen  Activity Tolerance Patient tolerated treatment well  Patient left Other (comment);with call bell/phone within reach (EOB)  Nurse Communication Mobility status  OT Assessment/Plan  OT Plan Discharge plan remains appropriate  OT Visit Diagnosis Other abnormalities of gait and mobility (R26.89)  OT Frequency (ACUTE ONLY) Min 2X/week  Follow Up Recommendations No OT follow up  OT  Equipment None recommended by OT  AM-PAC OT "6  Clicks" Daily Activity Outcome Measure (Version 2)  Help from another person eating meals? 4  Help from another person taking care of personal grooming? 3  Help from another person toileting, which includes using toliet, bedpan, or urinal? 3  Help from another person bathing (including washing, rinsing, drying)? 3  Help from another person to put on and taking off regular upper body clothing? 4  Help from another person to put on and taking off regular lower body clothing? 3  6 Click Score 20  OT Goal Progression  Progress towards OT goals Not progressing toward goals - comment (pt with decreased stability/overall strength this session)  Acute Rehab OT Goals  Patient Stated Goal feel better  OT Goal Formulation With patient  Time For Goal Achievement 07/31/20  Potential to Achieve Goals Good  ADL Goals  Additional ADL Goal #1 Patient will implement pursed lip breathing and diaphragmatic breathing techniques with no verbal cues during self care tasks  Additional ADL Goal #2 Patient will verbalize 3 energy conservation strategies to implement at home in order to maximize safety with daily routine.  OT Time Calculation  OT Start Time (ACUTE ONLY) 1245  OT Stop Time (ACUTE ONLY) 1313  OT Time Calculation (min) 28 min  OT General Charges  $OT Visit 1 Visit  OT Treatments  $Self Care/Home Management  23-37 mins   Delbert Phenix OT OT pager: [phone]

## 2020-07-23 LAB — CBC WITH DIFFERENTIAL/PLATELET
Abs Immature Granulocytes: 0.18 10*3/uL — ABNORMAL HIGH (ref 0.00–0.07)
Basophils Absolute: 0 10*3/uL (ref 0.0–0.1)
Basophils Relative: 0 %
Eosinophils Absolute: 0.1 10*3/uL (ref 0.0–0.5)
Eosinophils Relative: 1 %
HCT: 42.1 % (ref 39.0–52.0)
Hemoglobin: 14.2 g/dL (ref 13.0–17.0)
Immature Granulocytes: 2 %
Lymphocytes Relative: 9 %
Lymphs Abs: 0.7 10*3/uL (ref 0.7–4.0)
MCH: 29.7 pg (ref 26.0–34.0)
MCHC: 33.7 g/dL (ref 30.0–36.0)
MCV: 88.1 fL (ref 80.0–100.0)
Monocytes Absolute: 0.4 10*3/uL (ref 0.1–1.0)
Monocytes Relative: 5 %
Neutro Abs: 7.2 10*3/uL (ref 1.7–7.7)
Neutrophils Relative %: 83 %
Platelets: 243 10*3/uL (ref 150–400)
RBC: 4.78 MIL/uL (ref 4.22–5.81)
RDW: 13.4 % (ref 11.5–15.5)
WBC: 8.6 10*3/uL (ref 4.0–10.5)
nRBC: 0 % (ref 0.0–0.2)

## 2020-07-23 LAB — COMPREHENSIVE METABOLIC PANEL
ALT: 54 U/L — ABNORMAL HIGH (ref 0–44)
AST: 24 U/L (ref 15–41)
Albumin: 2.6 g/dL — ABNORMAL LOW (ref 3.5–5.0)
Alkaline Phosphatase: 53 U/L (ref 38–126)
Anion gap: 10 (ref 5–15)
BUN: 28 mg/dL — ABNORMAL HIGH (ref 8–23)
CO2: 28 mmol/L (ref 22–32)
Calcium: 8.4 mg/dL — ABNORMAL LOW (ref 8.9–10.3)
Chloride: 98 mmol/L (ref 98–111)
Creatinine, Ser: 0.75 mg/dL (ref 0.61–1.24)
GFR calc Af Amer: >60 mL/min (ref >60)
GFR calc non Af Amer: >60 mL/min (ref >60)
Glucose, Bld: 247 mg/dL — ABNORMAL HIGH (ref 70–99)
Potassium: 4.2 mmol/L (ref 3.5–5.1)
Sodium: 136 mmol/L (ref 135–145)
Total Bilirubin: 1.3 mg/dL — ABNORMAL HIGH (ref 0.3–1.2)
Total Protein: 5.5 g/dL — ABNORMAL LOW (ref 6.5–8.1)

## 2020-07-23 LAB — GLUCOSE, CAPILLARY
Glucose-Capillary: 186 mg/dL — ABNORMAL HIGH (ref 70–99)
Glucose-Capillary: 398 mg/dL — ABNORMAL HIGH (ref 70–99)
Glucose-Capillary: 314 mg/dL — ABNORMAL HIGH (ref 70–99)

## 2020-07-23 LAB — MAGNESIUM: Magnesium: 2.2 mg/dL (ref 1.7–2.4)

## 2020-07-23 LAB — PHOSPHORUS: Phosphorus: 2.9 mg/dL (ref 2.5–4.6)

## 2020-07-23 LAB — D-DIMER, QUANTITATIVE: D-Dimer, Quant: 3.12 ug/mL-FEU — ABNORMAL HIGH (ref 0.00–0.50)

## 2020-07-23 MED ORDER — DEXAMETHASONE 6 MG PO TABS: 6.0000 mg | ORAL_TABLET | Freq: Every day | ORAL | 0 refills | Status: AC

## 2020-07-23 NOTE — TOC Progression Note (Signed)
Transition of Care [facility]) - Progression Note    Patient Details  Name: Jonathan Shelton MRN: [id number] Date of Birth: [birth year]  [facility]) CM/SW Contact  Purcell Mouton, RN Phone Number: 07/23/2020, 4:06 PM  Clinical Narrative:    Oxygen from [facility] has not been delivered to pt's home as of present time.  Pt cannot discharge home until O2 is delivered to the home.     Expected Discharge Plan: [place] Barriers to Discharge: No Barriers Identified  Expected Discharge Plan and Services Expected Discharge Plan: [place] arrangements for the past 2 months: Single Family Home Expected Discharge Date: 07/23/20                                     Social Determinants of Health (SDOH) Interventions    Readmission Risk Interventions No flowsheet data found.

## 2020-07-23 NOTE — Progress Notes (Signed)
SATURATION QUALIFICATIONS: (This note is used to comply with regulatory documentation for home oxygen)  Patient Saturations on Room Air at Rest =89%  Patient Saturations on Room Air while Ambulating = 89%  Patient Saturations on 6 Liters of oxygen while Ambulating = 93%  Please briefly explain why patient needs home oxygen:Pt is COVID.

## 2020-07-23 NOTE — Discharge Summary (Signed)
Physician Discharge Summary  Jonathan Shelton KNL:[id number] DOB: 06/15/1951 DOA: 07/16/2020  PCP: [name], DO  Admit date: 07/16/2020 Discharge date: 07/23/2020  Admitted From: Home Disposition: Home  Recommendations for Outpatient Follow-up:  1. Follow up with PCP in 1-2 weeks 2. Please obtain BMP/CBC in one week  Home Health: Home health PT Equipment/Devices: Oxygen  Discharge Condition: Stable CODE STATUS: Full code Diet recommendation: Low-salt and low-carb diet  Discharge summary: 69 year old gentleman with history of insulin-dependent type 2 diabetes, BPH, diagnosed with COVID-19 on 8/24 presented to the emergency room on 9/1 with progressive worsening shortness of breath despite conservative management at home.  Lethargic and poor oral intake.  In the emergency room 82% on room air.  Bilateral infiltrates left more than right.  CRP 15.9.  D-dimer 4.59.  Patient was admitted to the hospital and treated for acute hypoxemic respiratory failure due to COVID-19 pneumonia with all the supportive treatment including chest physiotherapy, bronchodilator therapies, supplemental oxygen and Covid directed therapy with baricitinib day 7 today, remdesivir for 5 days, high-dose Solu-Medrol.  CTA was negative for pulmonary embolism. With all treatment, he is clinically improving.  At one point he was on 10 to 12 L of oxygen and his oxygen requirement has currently improved and he is maintaining adequate saturations on mobility on 6 L oxygen.  Plan: With adequate clinical improvement and improvement of oxygen requirement, patient wishes to go home.  Is medically stable but is still will need some time to recover.  He has adequate support at home, he is able to go home today with supplemental oxygen. Will finish total 10 days of steroid therapy. Recommend to continue chest physiotherapy at home.  He will benefit with physical therapy reevaluation at home for mobility and oxygen  monitoring. He is on multiple other medications that he will continue. Highly recommended to take COVID-19 vaccinations once he is improved from current illness.    Discharge Diagnoses:  Active Problems:   Type 2 diabetes mellitus with other specified complication (HCC)   Benign prostatic hyperplasia with incomplete bladder emptying   Obesity (BMI 30.0-34.9)   Acute respiratory disease due to COVID-19 virus    Discharge Instructions  Discharge Instructions    Call MD for:  difficulty breathing, headache or visual disturbances   Complete by: As directed    Call MD for:  extreme fatigue   Complete by: As directed    Call MD for:  persistant dizziness or light-headedness   Complete by: As directed    Call MD for:  temperature >100.4   Complete by: As directed    Diet - low sodium heart healthy   Complete by: As directed    Diet Carb Modified   Complete by: As directed    Discharge instructions   Complete by: As directed    Can use over-the-counter cough medications and Tylenol as needed. Use oxygen to keep saturations more than 85%, continue to do chest physiotherapy and deep breathing exercises. Recommend you take vaccination against COVID-19 when your current symptoms improve.   Increase activity slowly   Complete by: As directed      Allergies as of 07/23/2020      Reactions   Testosterone Rash   patch      Medication List    STOP taking these medications   azithromycin 250 MG tablet Commonly known as: ZITHROMAX     TAKE these medications   ascorbic acid 500 MG tablet Commonly known as: VITAMIN C Take  500 mg by mouth daily.   aspirin 81 MG chewable tablet Chew 81 mg by mouth daily.   atorvastatin 10 MG tablet Commonly known as: LIPITOR Take 1 tablet (10 mg total) by mouth daily at 6 PM.   b complex vitamins tablet Take 1 tablet by mouth daily.   CRANBERRY FRUIT PO Take 1 tablet by mouth daily.   dexamethasone 6 MG tablet Commonly known as:  DECADRON Take 1 tablet (6 mg total) by mouth daily for 3 days.   finasteride 5 MG tablet Commonly known as: PROSCAR TAKE 1 TABLET BY MOUTH EVERY DAY   insulin NPH Human 100 UNIT/ML injection Commonly known as: NovoLIN N Inject 0.4 mLs (40 Units total) into the skin at bedtime. May use reduced dose as advised. What changed:   how much to take  when to take this  reasons to take this   Jardiance 25 MG Tabs tablet Generic drug: empagliflozin TAKE 1 TABLET BY MOUTH EVERY DAY   levalbuterol 45 MCG/ACT inhaler Commonly known as: XOPENEX HFA Inhale 1-2 puffs into the lungs every 6 (six) hours as needed for wheezing.   metFORMIN 1000 MG tablet Commonly known as: GLUCOPHAGE Take 1 tablet (1,000 mg total) by mouth 2 (two) times daily with a meal.   Ozempic (1 MG/DOSE) 2 MG/1.5ML Sopn Generic drug: Semaglutide (1 MG/DOSE) Inject 1 mg into the skin once a week.   tamsulosin 0.4 MG Caps capsule Commonly known as: FLOMAX Take 1 capsule (0.4 mg total) by mouth daily after supper.   valsartan 80 MG tablet Commonly known as: DIOVAN Take 1 tablet (80 mg total) by mouth daily.   vitamin E 180 MG (400 UNITS) capsule Take 400 Units by mouth daily.            Durable Medical Equipment  (From admission, onward)         Start     Ordered   07/23/20 1159  For home use only DME oxygen  Once       Comments: Patient Saturations on Room Air at Rest =89%  Patient Saturations on [employer] while Ambulating = 89%  Patient Saturations on 6 Liters of oxygen while Ambulating = 93%  Please briefly explain why patient needs home oxygen:Pt is COVID  Question Answer Comment  Length of Need 6 Months   Mode or (Route) Nasal cannula   Liters per Minute 6   Frequency Continuous (stationary and portable oxygen unit needed)   Oxygen conserving device Yes   Oxygen delivery system Gas      07/23/20 1158          Follow-up Information    [name], DO Follow up in 2  week(s).   Specialty: Family Medicine Contact information: [address] [place] [address] [phone]              Allergies  Allergen Reactions  . Testosterone Rash    patch    Consultations:  None   Procedures/Studies: CT ANGIO CHEST PE W OR WO CONTRAST  Result Date: 07/17/2020 CLINICAL DATA:  COVID-19 viral infection. Increased oxygen demand and elevated D-dimer. Clinical suspicion for pulmonary embolism. EXAM: CT ANGIOGRAPHY CHEST WITH CONTRAST TECHNIQUE: Multidetector CT imaging of the chest was performed using the standard protocol during bolus administration of intravenous contrast. Multiplanar CT image reconstructions and MIPs were obtained to evaluate the vascular anatomy. CONTRAST:  55mL OMNIPAQUE IOHEXOL 350 MG/ML SOLN COMPARISON:  None. FINDINGS: Cardiovascular: Satisfactory opacification of pulmonary arteries noted,  and no pulmonary emboli identified. No evidence of thoracic aortic dissection or aneurysm. Mediastinum/Nodes: No masses or pathologically enlarged lymph nodes identified. Lungs/Pleura: Bilateral diffuse pulmonary airspace disease is seen, with greatest involvement of the lower lobes. No evidence of pleural effusion. Upper abdomen: No acute findings. Musculoskeletal: No suspicious bone lesions identified. Review of the MIP images confirms the above findings. IMPRESSION: No evidence of pulmonary embolism. Bilateral diffuse pulmonary airspace disease with lower lobe predominance, consistent with viral pneumonia. Electronically Signed   By: Marlaine Hind M.D.   On: 07/17/2020 11:36   DG Chest Port 1 View  Result Date: 07/16/2020 CLINICAL DATA:  Shortness of breath. Patient reports positive COVID test. EXAM: PORTABLE CHEST 1 VIEW COMPARISON:  None. FINDINGS: The heart size and mediastinal contours are within normal limits. Left worse than right interstitial opacities with basilar predominance. No pleural effusion or pneumothorax. The visualized skeletal structures  are unremarkable. IMPRESSION: Left worse than right interstitial opacities with basilar predominance. Findings suspicious for atypical/viral infection. Electronically Signed   By: Davina Poke D.O.   On: 07/16/2020 14:27   (Echo, Carotid, EGD, Colonoscopy, ERCP)    Subjective: Patient was seen and examined.  He wants to go home.  He is able to maintain his saturations on mobility on 6 L of oxygen.  He thinks he can recover at home better and walk around better at home. Patient's wife was called and updated and patient is ready to go home. Denies any chest pain or shortness of breath.   Discharge Exam: Vitals:   07/23/20 1125 07/23/20 1150  BP:    Pulse:    Resp:    Temp:    SpO2: (!) 89% (!) 89%   Vitals:   07/23/20 1119 07/23/20 1121 07/23/20 1125 07/23/20 1150  BP:      Pulse:      Resp:      Temp:      TempSrc:      SpO2: 93% 91% (!) 89% (!) 89%  Weight:      Height:        General: Pt is alert, awake, not in acute distress Cardiovascular: RRR, S1/S2 +, no rubs, no gallops Respiratory: CTA bilaterally, no wheezing, no rhonchi No added sounds.  He is on 5 L of oxygen at rest with no distress.  Talking in full sentences. Abdominal: Soft, NT, ND, bowel sounds + Extremities: no edema, no cyanosis    The results of significant diagnostics from this hospitalization (including imaging, microbiology, ancillary and laboratory) are listed below for reference.     Microbiology: Recent Results (from the past 240 hour(s))  Blood Culture (routine x 2)     Status: None   Collection Time: 07/16/20  1:58 PM   Specimen: BLOOD RIGHT FOREARM  Result Value Ref Range Status   Specimen Description   Final    BLOOD RIGHT FOREARM Performed at [facility] [address], [place], [place] [address]    Special Requests   Final    BOTTLES DRAWN AEROBIC AND ANAEROBIC Blood Culture adequate volume Performed at [facility] [address]  [place], [place] [address]    Culture   Final    NO GROWTH 5 DAYS Performed at [facility], [place] [address] [place], [place] [address]    Report Status 07/21/2020 FINAL  Final  Blood Culture (routine x 2)     Status: None   Collection Time: 07/16/20  1:58 PM   Specimen:  BLOOD  Result Value Ref Range Status   Specimen Description   Final    BLOOD RIGHT ANTECUBITAL Performed at [facility] [address] [place], [place] [address]    Special Requests   Final    BOTTLES DRAWN AEROBIC AND ANAEROBIC Blood Culture adequate volume Performed at [facility] [address], [place], [place] [address]    Culture   Final    NO GROWTH 5 DAYS Performed at [facility], [place] [address] [place],  [address]    Report Status 07/21/2020 FINAL  Final     Labs: BNP (last 3 results) No results for input(s): BNP in the last 8760 hours. Basic Metabolic Panel: Recent Labs  Lab 07/18/20 0439 07/19/20 0455 07/20/20 0540 07/21/20 0537 07/23/20 0440  NA 135 134* 139 132* 136  K 4.7 4.6 4.8 4.2 4.2  CL 104 102 103 99 98  CO2 19* 23 26 23 28   GLUCOSE 276* 343* 329* 287* 247*  BUN 30* 31* 33* 25* 28*  CREATININE 0.80 0.86 0.81 0.69 0.75  CALCIUM 8.2* 8.5* 8.5* 8.1* 8.4*  MG  --   --   --   --  2.2  PHOS  --   --   --   --  2.9   Liver Function Tests: Recent Labs  Lab 07/18/20 0439 07/19/20 0455 07/20/20 0540 07/21/20 0537 07/23/20 0440  AST 40 46* 40 41 24  ALT 36 50* 48* 61* 54*  ALKPHOS 46 52 53 54 53  BILITOT 0.8 1.0 1.6* 1.6* 1.3*  PROT 6.2* 6.4* 5.7* 5.7* 5.5*  ALBUMIN 2.6* 2.7* 2.7* 2.6* 2.6*   No results for input(s): LIPASE, AMYLASE in the last 168 hours. No results for input(s): AMMONIA in the last 168 hours. CBC: Recent Labs  Lab 07/18/20 0439 07/19/20 0455 07/20/20 0540 07/21/20 0537 07/23/20 0440  WBC 6.6 8.4 6.9 8.5 8.6  NEUTROABS 4.8 7.0 6.1 7.8* 7.2  HGB 14.6 15.1 14.5 15.0 14.2  HCT 43.2 45.5 43.5  44.6 42.1  MCV 89.1 89.6 89.0 88.3 88.1  PLT 235 269 245 256 243   Cardiac Enzymes: No results for input(s): CKTOTAL, CKMB, CKMBINDEX, TROPONINI in the last 168 hours. BNP: Invalid input(s): POCBNP CBG: Recent Labs  Lab 07/22/20 1211 07/22/20 1606 07/22/20 2134 07/23/20 0816 07/23/20 1157  GLUCAP 318* 365* 379* 186* 398*   D-Dimer Recent Labs    07/21/20 0537 07/23/20 0440  DDIMER 5.03* 3.12*   Hgb A1c No results for input(s): HGBA1C in the last 72 hours. Lipid Profile No results for input(s): CHOL, HDL, LDLCALC, TRIG, CHOLHDL, LDLDIRECT in the last 72 hours. Thyroid function studies No results for input(s): TSH, T4TOTAL, T3FREE, THYROIDAB in the last 72 hours.  Invalid input(s): FREET3 Anemia work up No results for input(s): VITAMINB12, FOLATE, FERRITIN, TIBC, IRON, RETICCTPCT in the last 72 hours. Urinalysis    Component Value Date/Time   COLORURINE YELLOW 07/03/2019 0908   APPEARANCEUR CLEAR 07/03/2019 0908   LABSPEC 1.031 07/03/2019 0908   PHURINE < OR = 5.0 07/03/2019 0908   GLUCOSEU 3+ (A) 07/03/2019 0908   HGBUR NEGATIVE 07/03/2019 0908   BILIRUBINUR negative 02/11/2020 1458   KETONESUR NEGATIVE 07/03/2019 0908   PROTEINUR Positive (A) 02/11/2020 1458   PROTEINUR NEGATIVE 07/03/2019 0908   UROBILINOGEN 0.2 02/11/2020 1458   NITRITE negative 02/11/2020 1458   NITRITE NEGATIVE 07/03/2019 0908   LEUKOCYTESUR Negative 02/11/2020 1458   LEUKOCYTESUR NEGATIVE 07/03/2019 0908   Sepsis Labs  Invalid input(s): PROCALCITONIN,  WBC,  LACTICIDVEN Microbiology Recent Results (from the past 240 hour(s))  Blood Culture (routine x 2)     Status: None   Collection Time: 07/16/20  1:58 PM   Specimen: BLOOD RIGHT FOREARM  Result Value Ref Range Status   Specimen Description   Final    BLOOD RIGHT FOREARM Performed at [facility] [address], [place], [place] [address]    Special Requests   Final    BOTTLES DRAWN AEROBIC AND ANAEROBIC Blood  Culture adequate volume Performed at [facility] [address], [place], [place] [address]    Culture   Final    NO GROWTH 5 DAYS Performed at [facility], [place] [address], [place], [place] [address]    Report Status 07/21/2020 FINAL  Final  Blood Culture (routine x 2)     Status: None   Collection Time: 07/16/20  1:58 PM   Specimen: BLOOD  Result Value Ref Range Status   Specimen Description   Final    BLOOD RIGHT ANTECUBITAL Performed at [facility] [address], [place], [place] [address]    Special Requests   Final    BOTTLES DRAWN AEROBIC AND ANAEROBIC Blood Culture adequate volume Performed at [facility] [address], [place], [place] [address]    Culture   Final    NO GROWTH 5 DAYS Performed at [facility], [place] [address] [place], [place] [address]    Report Status 07/21/2020 FINAL  Final     Time coordinating discharge:  45 minutes  SIGNED:   Barb Merino, MD  [facility] 07/23/2020, 1:21 PM

## 2020-07-23 NOTE — Care Management Important Message (Signed)
Important Message  Patient Details IM Letter given to the Patient Name: Jonathan Shelton MRN: [id number] Date of Birth: June 14, 1951   Medicare Important Message Given:  Yes     Kerin Salen 07/23/2020, 1:40 PM

## 2020-07-23 NOTE — Progress Notes (Signed)
Physical Therapy Treatment Patient Details Name: Jonathan Shelton MRN: [id number] DOB: 12-28-1950 Today's Date: 07/23/2020    History of Present Illness Patient is a 69 year old male history of insulin-dependent type 2 diabetes, BPH, diagnosed with COVID-19 on 8/24 presented to emergency room on 9/1 with progressively worsening shortness of breath    PT Comments    Pt OOB in recliner on 6 lts at 91% at rest.  Assisted with amb around room trial RA decreased to 80% with 3/4 dyspnea.  Reapplied O2.  General transfer comment: close supervision for safety nut pt self able to perform several sit to stands.  Assisted with "wash up" and oral hygiene at sink mostly seated to conserve energy.General Gait Details: pt able to amb around room with walker demonstarting good safety cognition and turns.  Pt amb w/o walker but required light lean on furniture/door frame.  Pt plans to D/C to home today.    Follow Up Recommendations  Home health PT     Equipment Recommendations   (oxygen)    Recommendations for Other Services       Precautions / Restrictions Precautions Precautions: Fall Precaution Comments: monitor vitals Restrictions Weight Bearing Restrictions: No    Mobility  Bed Mobility               General bed mobility comments: OOB in recliner  Transfers Overall transfer level: Needs assistance Equipment used: None;Rolling walker (2 wheeled) Transfers: Sit to/from [employer] Transfers Sit to Stand: Supervision Stand pivot transfers: Supervision;Min guard       General transfer comment: close supervision for safety nut pt self able to perform several sit to stands.  Assisted with "wash up" and oral hygiene at sink mostly seated to conserve energy.  Ambulation/Gait Ambulation/Gait assistance: Supervision Gait Distance (Feet): 33 Feet Assistive device: Rolling walker (2 wheeled) Gait Pattern/deviations: Step-to pattern;Step-through pattern;Decreased weight shift to  left;Shuffle Gait velocity: decreased   General Gait Details: pt able to amb around room with walker demonstarting good safety cognition and turns.  Pt amb w/o walker but required light lean on furniture/door frame.   Stairs             Wheelchair Mobility    Modified Rankin (Stroke Patients Only)       Balance                                            Cognition Arousal/Alertness: Awake/alert   Overall Cognitive Status: Within Functional Limits for tasks assessed                                 General Comments: AxO x 3 more Sprite following all commands and conversive      Exercises      General Comments        Pertinent Vitals/Pain Pain Assessment: No/denies pain    Home Living                      Prior Function            PT Goals (current goals can now be found in the care plan section) Progress towards PT goals: Progressing toward goals    Frequency    Min 3X/week      PT Plan Current plan remains appropriate  Co-evaluation              AM-PAC PT "6 Clicks" Mobility   Outcome Measure  Help needed turning from your back to your side while in a flat bed without using bedrails?: A Little Help needed moving from lying on your back to sitting on the side of a flat bed without using bedrails?: A Little Help needed moving to and from a bed to a chair (including a wheelchair)?: A Little Help needed standing up from a chair using your arms (e.g., wheelchair or bedside chair)?: A Little Help needed to walk in hospital room?: A Little Help needed climbing 3-5 steps with a railing? : A Lot 6 Click Score: 17    End of Session Equipment Utilized During Treatment: Oxygen Activity Tolerance: Patient tolerated treatment well Patient left: in chair;with call bell/phone within reach Nurse Communication: Mobility status PT Visit Diagnosis: Unsteadiness on feet (R26.81);Difficulty in walking, not  elsewhere classified (R26.2)     Time: 1430-1510 PT Time Calculation (min) (ACUTE ONLY): 40 min  Charges:  $Gait Training: 8-22 mins $Therapeutic Activity: 23-37 mins                     Rica Koyanagi  PTA Acute  Rehabilitation Services Pager      [phone] Office      [phone]

## 2020-07-24 ENCOUNTER — Telehealth: Payer: Self-pay | Admitting: Family Medicine

## 2020-07-24 DIAGNOSIS — E1169 Type 2 diabetes mellitus with other specified complication: Secondary | ICD-10-CM

## 2020-07-24 MED ORDER — GLUCOSE BLOOD VI STRP: ORAL_STRIP | 3 refills | Status: DC

## 2020-07-24 NOTE — Telephone Encounter (Signed)
Pt. Requesting One Touch Glucose test strips.They are not on his medication list. Please advise.

## 2020-07-24 NOTE — Telephone Encounter (Signed)
Patient was recently in the hospital and checking his blood sugar x 4 day instead of 2 and only 5 strips left wants Rx send to [facility] for One touch meter.

## 2020-07-24 NOTE — Telephone Encounter (Signed)
Sent rx one touch test strip 4 strips per day 400 total count, 90 day  Jonathan Shelton, [facility] 07/24/2020, 4:07 PM

## 2020-07-24 NOTE — Telephone Encounter (Signed)
Medication: One Touch Glucose Strips  Has the patient contacted their pharmacy? YES  (Agent: If no, request that the patient contact the pharmacy for the refill.) (Agent: If yes, when and what did the pharmacy advise?)  Preferred Pharmacy (with phone number or street name): [id number] [name] [place] [address] Phone: [phone] Fax: [phone] Hours: Not open 24 hours    Agent: Please be advised that RX refills may take up to 3 business days. We ask that you follow-up with your pharmacy.

## 2020-07-25 ENCOUNTER — Telehealth: Payer: Self-pay

## 2020-07-25 NOTE — Telephone Encounter (Signed)
Per wife Jonathan Shelton Transition Care Management Follow-up Telephone Call  Date of discharge and from where: [facility]  How have you been since you were released from the hospital? Weak, home health coming in  Any questions or concerns? No  Items Reviewed:  Did the pt receive and understand the discharge instructions provided? Yes   Medications obtained and verified? Yes   Any new allergies since your discharge? No   Dietary orders reviewed? Yes  Do you have support at home? Yes   Functional Questionnaire: (I = Independent and D = Dependent) ADLs: D  Bathing/Dressing- D  Meal Prep- D  Eating- I  Maintaining continence- I  Transferring/Ambulation- D  Managing Meds- D  Follow up appointments reviewed:   PCP Hospital f/u appt confirmed? Yes  Scheduled to see Dr. Parks Ranger on 08/05/2020 @ 11:20 (virtual).  Are transportation arrangements needed? No   If their condition worsens, is the pt aware to call PCP or go to the Emergency Dept.? Yes  Was the patient provided with contact information for the PCP's office or ED? Yes  Was to pt encouraged to call back with questions or concerns? Yes

## 2020-08-05 ENCOUNTER — Telehealth (INDEPENDENT_AMBULATORY_CARE_PROVIDER_SITE_OTHER): Payer: Medicare HMO | Admitting: Family Medicine

## 2020-08-05 ENCOUNTER — Encounter: Payer: Self-pay | Admitting: Family Medicine

## 2020-08-05 ENCOUNTER — Other Ambulatory Visit: Payer: Self-pay | Admitting: Family Medicine

## 2020-08-05 ENCOUNTER — Other Ambulatory Visit: Payer: Self-pay

## 2020-08-05 VITALS — BP 120/70 | HR 80

## 2020-08-05 DIAGNOSIS — U071 COVID-19: Secondary | ICD-10-CM | POA: Diagnosis not present

## 2020-08-05 DIAGNOSIS — Z1159 Encounter for screening for other viral diseases: Secondary | ICD-10-CM

## 2020-08-05 DIAGNOSIS — J1282 Pneumonia due to coronavirus disease 2019: Secondary | ICD-10-CM | POA: Diagnosis not present

## 2020-08-05 DIAGNOSIS — J9601 Acute respiratory failure with hypoxia: Secondary | ICD-10-CM

## 2020-08-05 DIAGNOSIS — E785 Hyperlipidemia, unspecified: Secondary | ICD-10-CM

## 2020-08-05 DIAGNOSIS — N401 Enlarged prostate with lower urinary tract symptoms: Secondary | ICD-10-CM

## 2020-08-05 DIAGNOSIS — E1169 Type 2 diabetes mellitus with other specified complication: Secondary | ICD-10-CM

## 2020-08-05 DIAGNOSIS — Z0000 Encounter for general adult medical examination without abnormal findings: Secondary | ICD-10-CM

## 2020-08-05 DIAGNOSIS — I10 Essential (primary) hypertension: Secondary | ICD-10-CM

## 2020-08-05 NOTE — Patient Instructions (Signed)
DUE for FASTING BLOOD WORK (no food or drink after midnight before the lab appointment, only water or coffee without cream/sugar on the morning of)  SCHEDULE "Lab Only" visit in the morning at the clinic for lab draw in 1 WEEK  - Make sure Lab Only appointment is at about 1 week before your next appointment, so that results will be available  For Lab Results, once available within 2-3 days of blood draw, you can can log in to MyChart online to view your results and a brief explanation. Also, we can discuss results at next follow-up visit.   Please schedule a Follow-up Appointment to: No follow-ups on file.  If you have any other questions or concerns, please feel free to call the office or send a message through [facility]. You may also schedule an earlier appointment if necessary.  Additionally, you may be receiving a survey about your experience at our office within a few days to 1 week by e-mail or mail. We value your feedback.  Nobie Putnam, DO [facility]

## 2020-08-05 NOTE — Progress Notes (Signed)
Subjective:    Patient ID: Jonathan Shelton, male    DOB: 1951/08/05, 69 y.o.   MRN: [id number]  RICE Jonathan Shelton is a 69 y.o. male presenting on 08/05/2020 for Hospitalization Follow-up  Virtual / Telehealth Encounter - Video Visit via [place] The purpose of this virtual visit is to provide medical care while limiting exposure to the novel coronavirus (COVID19) for both patient and office staff.  Consent was obtained for remote visit:  Yes.   Answered questions that patient had about telehealth interaction:  Yes.   I discussed the limitations, risks, security and privacy concerns of performing an evaluation and management service by video/telephone. I also discussed with the patient that there may be a patient responsible charge related to this service. The patient expressed understanding and agreed to proceed.  Patient Location: Home Provider Location: [facility] (Office)   [place]  [facility]Location: [place] Date of Admission: 07/16/20 Date of Discharge: 07/23/20 Transitions of care telephone call: Completed on 07/25/20  Reason for Admission: COVID Primary (+Secondary) Diagnosis: COVID19, Pneumonia  [facility] H&P and Discharge Summary have been reviewed - Patient presents today about 13 days after recent hospitalization. Brief summary of recent course, patient had symptoms of dyspnea, cough, found to be hypoxia, hospitalized, treated with steroids and oxygen.  Now off supplement oxygen, home pulse 96% at rest or walking. No longer needing oxygen. He finished a Prednisone 10 day total prednisone course, discharged from hospital with 3 days treatment.  - Today reports overall has done well after discharge. Symptoms of fever has resolved. Cough has improved now has some episodic cough worse at night.  Admits nose dry, difficult to get nasal congestion   I have reviewed the discharge medication list, and  have reconciled the current and discharge medications today.     Depression screen [facility] 2/9 03/18/2020 02/11/2020 10/08/2019  Decreased Interest 0 0 0  Down, Depressed, Hopeless 0 0 0  PHQ - 2 Score 0 0 0  Altered sleeping - - -  Tired, decreased energy - - -  Change in appetite - - -  Feeling bad or failure about yourself  - - -  Trouble concentrating - - -  Moving slowly or fidgety/restless - - -  Suicidal thoughts - - -  PHQ-9 Score - - -  Difficult doing work/chores - - -    Social History   Tobacco Use  . Smoking status: Former Smoker    Types: Cigarettes    Quit date: 1970    Years since quitting: 51.7  . Smokeless tobacco: Former Systems developer    Types: Snuff  Vaping Use  . Vaping Use: Never used  Substance Use Topics  . Alcohol use: Not Currently  . Drug use: Not Currently    Comment: past    Review of Systems Per HPI unless specifically indicated above     Objective:    BP 120/70   Pulse 80   SpO2 97%   Wt Readings from Last 3 Encounters:  07/20/20 236 lb 15.9 oz (107.5 kg)  06/13/20 (!) 238 lb (108 kg)  03/31/20 232 lb (105.2 kg)    Physical Exam   Note examination was completely remotely via video observation objective data only  Gen - well-appearing, no acute distress or apparent pain, comfortable HEENT - eyes appear clear without discharge or redness Heart/Lungs - cannot examine virtually - observed no evidence of coughing or labored breathing. Abd -  cannot examine virtually  Skin - face visible today- no rash Neuro - awake, alert, oriented Psych - not anxious appearing   Results for orders placed or performed during the hospital encounter of 07/16/20  Blood Culture (routine x 2)   Specimen: BLOOD RIGHT FOREARM  Result Value Ref Range   Specimen Description      BLOOD RIGHT FOREARM Performed at [facility] [address], [place], [place] [address]    Special Requests      BOTTLES DRAWN AEROBIC AND ANAEROBIC Blood Culture  adequate volume Performed at [facility] [address] [place], [place] [address]    Culture      NO GROWTH 5 DAYS Performed at [facility], [place] [address], [place], [place] [address]    Report Status 07/21/2020 FINAL   Blood Culture (routine x 2)   Specimen: BLOOD  Result Value Ref Range   Specimen Description      BLOOD RIGHT ANTECUBITAL Performed at [facility] [address], [place], [place] [address]    Special Requests      BOTTLES DRAWN AEROBIC AND ANAEROBIC Blood Culture adequate volume Performed at [facility] [address], [place], [place] [address]    Culture      NO GROWTH 5 DAYS Performed at [facility], [place] [address], [place], [place] [address]    Report Status 07/21/2020 FINAL   Lactic acid, plasma  Result Value Ref Range   Lactic Acid, Venous 1.3 0.5 - 1.9 mmol/L  CBC WITH DIFFERENTIAL  Result Value Ref Range   WBC 6.5 4.0 - 10.5 K/uL   RBC 5.41 4.22 - 5.81 MIL/uL   Hemoglobin 16.0 13.0 - 17.0 g/dL   HCT 47.6 39 - 52 %   MCV 88.0 80.0 - 100.0 fL   MCH 29.6 26.0 - 34.0 pg   MCHC 33.6 30.0 - 36.0 g/dL   RDW 14.7 11.5 - 15.5 %   Platelets 200 150 - 400 K/uL   nRBC 0.0 0.0 - 0.2 %   Neutrophils Relative % 81 %   Neutro Abs 5.2 1.7 - 7.7 K/uL   Lymphocytes Relative 12 %   Lymphs Abs 0.8 0.7 - 4.0 K/uL   Monocytes Relative 6 %   Monocytes Absolute 0.4 0 - 1 K/uL   Eosinophils Relative 0 %   Eosinophils Absolute 0.0 0 - 0 K/uL   Basophils Relative 0 %   Basophils Absolute 0.0 0 - 0 K/uL   Immature Granulocytes 1 %   Abs Immature Granulocytes 0.04 0.00 - 0.07 K/uL  Comprehensive metabolic panel  Result Value Ref Range   Sodium 132 (L) 135 - 145 mmol/L   Potassium 4.1 3.5 - 5.1 mmol/L   Chloride 96 (L) 98 - 111 mmol/L   CO2 18 (L) 22 - 32 mmol/L   Glucose, Bld 130 (H) 70 - 99 mg/dL   BUN 24 (H) 8 - 23 mg/dL   Creatinine, Ser 0.97 0.61 - 1.24 mg/dL   Calcium 9.5 8.9 -  10.3 mg/dL   Total Protein 7.9 6.5 - 8.1 g/dL   Albumin 3.2 (L) 3.5 - 5.0 g/dL   AST 61 (H) 15 - 41 U/L   ALT 38 0 - 44 U/L   Alkaline Phosphatase 51 38 - 126 U/L   Total Bilirubin 1.2 0.3 - 1.2 mg/dL   GFR calc non Af Amer >60 >60 mL/min   GFR calc Af Amer >  60 >60 mL/min   Anion gap 18 (H) 5 - 15  D-dimer, quantitative  Result Value Ref Range   D-Dimer, Quant 4.59 (H) 0.00 - 0.50 ug/mL-FEU  Procalcitonin  Result Value Ref Range   Procalcitonin <0.10 ng/mL  Lactate dehydrogenase  Result Value Ref Range   LDH 524 (H) 98 - 192 U/L  Ferritin  Result Value Ref Range   Ferritin 1,283 (H) 24 - 336 ng/mL  Triglycerides  Result Value Ref Range   Triglycerides 119 <150 mg/dL  Fibrinogen  Result Value Ref Range   Fibrinogen 729 (H) 210 - 475 mg/dL  C-reactive protein  Result Value Ref Range   CRP 19.5 (H) <1.0 mg/dL  Hemoglobin A1c  Result Value Ref Range   Hgb A1c MFr Bld 7.6 (H) 4.8 - 5.6 %   Mean Plasma Glucose 171.42 mg/dL  HIV Antibody (routine testing w rflx)  Result Value Ref Range   HIV Screen 4th Generation wRfx Non Reactive Non Reactive  CBC with Differential/Platelet  Result Value Ref Range   WBC 4.1 4.0 - 10.5 K/uL   RBC 4.66 4.22 - 5.81 MIL/uL   Hemoglobin 14.2 13.0 - 17.0 g/dL   HCT 41.7 39 - 52 %   MCV 89.5 80.0 - 100.0 fL   MCH 30.5 26.0 - 34.0 pg   MCHC 34.1 30.0 - 36.0 g/dL   RDW 14.7 11.5 - 15.5 %   Platelets 193 150 - 400 K/uL   nRBC 0.0 0.0 - 0.2 %   Neutrophils Relative % 79 %   Neutro Abs 3.2 1.7 - 7.7 K/uL   Lymphocytes Relative 13 %   Lymphs Abs 0.5 (L) 0.7 - 4.0 K/uL   Monocytes Relative 6 %   Monocytes Absolute 0.3 0 - 1 K/uL   Eosinophils Relative 0 %   Eosinophils Absolute 0.0 0 - 0 K/uL   Basophils Relative 0 %   Basophils Absolute 0.0 0 - 0 K/uL   Immature Granulocytes 2 %   Abs Immature Granulocytes 0.06 0.00 - 0.07 K/uL   Reactive, Benign Lymphocytes PRESENT   Comprehensive metabolic panel  Result Value Ref Range   Sodium 134 (L)  135 - 145 mmol/L   Potassium 4.3 3.5 - 5.1 mmol/L   Chloride 103 98 - 111 mmol/L   CO2 16 (L) 22 - 32 mmol/L   Glucose, Bld 210 (H) 70 - 99 mg/dL   BUN 30 (H) 8 - 23 mg/dL   Creatinine, Ser 0.77 0.61 - 1.24 mg/dL   Calcium 8.4 (L) 8.9 - 10.3 mg/dL   Total Protein 6.5 6.5 - 8.1 g/dL   Albumin 2.6 (L) 3.5 - 5.0 g/dL   AST 46 (H) 15 - 41 U/L   ALT 34 0 - 44 U/L   Alkaline Phosphatase 47 38 - 126 U/L   Total Bilirubin 0.8 0.3 - 1.2 mg/dL   GFR calc non Af Amer >60 >60 mL/min   GFR calc Af Amer >60 >60 mL/min   Anion gap 15 5 - 15  C-reactive protein  Result Value Ref Range   CRP 15.1 (H) <1.0 mg/dL  D-dimer, quantitative (not at [facility])  Result Value Ref Range   D-Dimer, Quant 4.26 (H) 0.00 - 0.50 ug/mL-FEU  CBC with Differential/Platelet  Result Value Ref Range   WBC 6.6 4.0 - 10.5 K/uL   RBC 4.85 4.22 - 5.81 MIL/uL   Hemoglobin 14.6 13.0 - 17.0 g/dL   HCT 43.2 39 - 52 %   MCV 89.1  80.0 - 100.0 fL   MCH 30.1 26.0 - 34.0 pg   MCHC 33.8 30.0 - 36.0 g/dL   RDW 14.6 11.5 - 15.5 %   Platelets 235 150 - 400 K/uL   nRBC 0.0 0.0 - 0.2 %   Neutrophils Relative % 74 %   Neutro Abs 4.8 1.7 - 7.7 K/uL   Lymphocytes Relative 18 %   Lymphs Abs 1.2 0.7 - 4.0 K/uL   Monocytes Relative 7 %   Monocytes Absolute 0.5 0 - 1 K/uL   Eosinophils Relative 0 %   Eosinophils Absolute 0.0 0 - 0 K/uL   Basophils Relative 0 %   Basophils Absolute 0.0 0 - 0 K/uL   Immature Granulocytes 1 %   Abs Immature Granulocytes 0.09 (H) 0.00 - 0.07 K/uL   Reactive, Benign Lymphocytes PRESENT   Comprehensive metabolic panel  Result Value Ref Range   Sodium 135 135 - 145 mmol/L   Potassium 4.7 3.5 - 5.1 mmol/L   Chloride 104 98 - 111 mmol/L   CO2 19 (L) 22 - 32 mmol/L   Glucose, Bld 276 (H) 70 - 99 mg/dL   BUN 30 (H) 8 - 23 mg/dL   Creatinine, Ser 0.80 0.61 - 1.24 mg/dL   Calcium 8.2 (L) 8.9 - 10.3 mg/dL   Total Protein 6.2 (L) 6.5 - 8.1 g/dL   Albumin 2.6 (L) 3.5 - 5.0 g/dL   AST 40 15 - 41 U/L   ALT 36  0 - 44 U/L   Alkaline Phosphatase 46 38 - 126 U/L   Total Bilirubin 0.8 0.3 - 1.2 mg/dL   GFR calc non Af Amer >60 >60 mL/min   GFR calc Af Amer >60 >60 mL/min   Anion gap 12 5 - 15  C-reactive protein  Result Value Ref Range   CRP 7.9 (H) <1.0 mg/dL  D-dimer, quantitative (not at [facility])  Result Value Ref Range   D-Dimer, Quant 3.57 (H) 0.00 - 0.50 ug/mL-FEU  Glucose, capillary  Result Value Ref Range   Glucose-Capillary 276 (H) 70 - 99 mg/dL  Glucose, capillary  Result Value Ref Range   Glucose-Capillary 270 (H) 70 - 99 mg/dL  Glucose, capillary  Result Value Ref Range   Glucose-Capillary 283 (H) 70 - 99 mg/dL  Glucose, capillary  Result Value Ref Range   Glucose-Capillary 357 (H) 70 - 99 mg/dL  CBC with Differential/Platelet  Result Value Ref Range   WBC 8.4 4.0 - 10.5 K/uL   RBC 5.08 4.22 - 5.81 MIL/uL   Hemoglobin 15.1 13.0 - 17.0 g/dL   HCT 45.5 39 - 52 %   MCV 89.6 80.0 - 100.0 fL   MCH 29.7 26.0 - 34.0 pg   MCHC 33.2 30.0 - 36.0 g/dL   RDW 14.2 11.5 - 15.5 %   Platelets 269 150 - 400 K/uL   nRBC 0.0 0.0 - 0.2 %   Neutrophils Relative % 83 %   Neutro Abs 7.0 1.7 - 7.7 K/uL   Lymphocytes Relative 10 %   Lymphs Abs 0.8 0.7 - 4.0 K/uL   Monocytes Relative 6 %   Monocytes Absolute 0.5 0 - 1 K/uL   Eosinophils Relative 0 %   Eosinophils Absolute 0.0 0 - 0 K/uL   Basophils Relative 0 %   Basophils Absolute 0.0 0 - 0 K/uL   Immature Granulocytes 1 %   Abs Immature Granulocytes 0.09 (H) 0.00 - 0.07 K/uL  Comprehensive metabolic panel  Result Value Ref Range  Sodium 134 (L) 135 - 145 mmol/L   Potassium 4.6 3.5 - 5.1 mmol/L   Chloride 102 98 - 111 mmol/L   CO2 23 22 - 32 mmol/L   Glucose, Bld 343 (H) 70 - 99 mg/dL   BUN 31 (H) 8 - 23 mg/dL   Creatinine, Ser 0.86 0.61 - 1.24 mg/dL   Calcium 8.5 (L) 8.9 - 10.3 mg/dL   Total Protein 6.4 (L) 6.5 - 8.1 g/dL   Albumin 2.7 (L) 3.5 - 5.0 g/dL   AST 46 (H) 15 - 41 U/L   ALT 50 (H) 0 - 44 U/L   Alkaline Phosphatase 52  38 - 126 U/L   Total Bilirubin 1.0 0.3 - 1.2 mg/dL   GFR calc non Af Amer >60 >60 mL/min   GFR calc Af Amer >60 >60 mL/min   Anion gap 9 5 - 15  C-reactive protein  Result Value Ref Range   CRP 4.3 (H) <1.0 mg/dL  D-dimer, quantitative (not at [facility])  Result Value Ref Range   D-Dimer, Quant 3.19 (H) 0.00 - 0.50 ug/mL-FEU  Glucose, capillary  Result Value Ref Range   Glucose-Capillary 415 (H) 70 - 99 mg/dL  Glucose, capillary  Result Value Ref Range   Glucose-Capillary 288 (H) 70 - 99 mg/dL  Glucose, capillary  Result Value Ref Range   Glucose-Capillary 345 (H) 70 - 99 mg/dL  CBC with Differential/Platelet  Result Value Ref Range   WBC 6.9 4.0 - 10.5 K/uL   RBC 4.89 4.22 - 5.81 MIL/uL   Hemoglobin 14.5 13.0 - 17.0 g/dL   HCT 43.5 39 - 52 %   MCV 89.0 80.0 - 100.0 fL   MCH 29.7 26.0 - 34.0 pg   MCHC 33.3 30.0 - 36.0 g/dL   RDW 13.9 11.5 - 15.5 %   Platelets 245 150 - 400 K/uL   nRBC 0.0 0.0 - 0.2 %   Neutrophils Relative % 88 %   Neutro Abs 6.1 1.7 - 7.7 K/uL   Lymphocytes Relative 5 %   Lymphs Abs 0.3 (L) 0.7 - 4.0 K/uL   Monocytes Relative 6 %   Monocytes Absolute 0.4 0 - 1 K/uL   Eosinophils Relative 0 %   Eosinophils Absolute 0.0 0 - 0 K/uL   Basophils Relative 0 %   Basophils Absolute 0.0 0 - 0 K/uL   Immature Granulocytes 1 %   Abs Immature Granulocytes 0.05 0.00 - 0.07 K/uL  Comprehensive metabolic panel  Result Value Ref Range   Sodium 139 135 - 145 mmol/L   Potassium 4.8 3.5 - 5.1 mmol/L   Chloride 103 98 - 111 mmol/L   CO2 26 22 - 32 mmol/L   Glucose, Bld 329 (H) 70 - 99 mg/dL   BUN 33 (H) 8 - 23 mg/dL   Creatinine, Ser 0.81 0.61 - 1.24 mg/dL   Calcium 8.5 (L) 8.9 - 10.3 mg/dL   Total Protein 5.7 (L) 6.5 - 8.1 g/dL   Albumin 2.7 (L) 3.5 - 5.0 g/dL   AST 40 15 - 41 U/L   ALT 48 (H) 0 - 44 U/L   Alkaline Phosphatase 53 38 - 126 U/L   Total Bilirubin 1.6 (H) 0.3 - 1.2 mg/dL   GFR calc non Af Amer >60 >60 mL/min   GFR calc Af Amer >60 >60 mL/min    Anion gap 10 5 - 15  C-reactive protein  Result Value Ref Range   CRP 2.0 (H) <1.0 mg/dL  D-dimer, quantitative (not  at [facility])  Result Value Ref Range   D-Dimer, Quant 5.82 (H) 0.00 - 0.50 ug/mL-FEU  Glucose, capillary  Result Value Ref Range   Glucose-Capillary 257 (H) 70 - 99 mg/dL  Glucose, capillary  Result Value Ref Range   Glucose-Capillary 302 (H) 70 - 99 mg/dL   Comment 1 Call MD NNP PA CNM   Glucose, capillary  Result Value Ref Range   Glucose-Capillary 409 (H) 70 - 99 mg/dL  CBC with Differential/Platelet  Result Value Ref Range   WBC 8.5 4.0 - 10.5 K/uL   RBC 5.05 4.22 - 5.81 MIL/uL   Hemoglobin 15.0 13.0 - 17.0 g/dL   HCT 44.6 39 - 52 %   MCV 88.3 80.0 - 100.0 fL   MCH 29.7 26.0 - 34.0 pg   MCHC 33.6 30.0 - 36.0 g/dL   RDW 13.8 11.5 - 15.5 %   Platelets 256 150 - 400 K/uL   nRBC 0.0 0.0 - 0.2 %   Neutrophils Relative % 91 %   Neutro Abs 7.8 (H) 1.7 - 7.7 K/uL   Lymphocytes Relative 4 %   Lymphs Abs 0.3 (L) 0.7 - 4.0 K/uL   Monocytes Relative 4 %   Monocytes Absolute 0.3 0 - 1 K/uL   Eosinophils Relative 0 %   Eosinophils Absolute 0.0 0 - 0 K/uL   Basophils Relative 0 %   Basophils Absolute 0.0 0 - 0 K/uL   Immature Granulocytes 1 %   Abs Immature Granulocytes 0.06 0.00 - 0.07 K/uL  Comprehensive metabolic panel  Result Value Ref Range   Sodium 132 (L) 135 - 145 mmol/L   Potassium 4.2 3.5 - 5.1 mmol/L   Chloride 99 98 - 111 mmol/L   CO2 23 22 - 32 mmol/L   Glucose, Bld 287 (H) 70 - 99 mg/dL   BUN 25 (H) 8 - 23 mg/dL   Creatinine, Ser 0.69 0.61 - 1.24 mg/dL   Calcium 8.1 (L) 8.9 - 10.3 mg/dL   Total Protein 5.7 (L) 6.5 - 8.1 g/dL   Albumin 2.6 (L) 3.5 - 5.0 g/dL   AST 41 15 - 41 U/L   ALT 61 (H) 0 - 44 U/L   Alkaline Phosphatase 54 38 - 126 U/L   Total Bilirubin 1.6 (H) 0.3 - 1.2 mg/dL   GFR calc non Af Amer >60 >60 mL/min   GFR calc Af Amer >60 >60 mL/min   Anion gap 10 5 - 15  C-reactive protein  Result Value Ref Range   CRP 1.2 (H) <1.0 mg/dL   D-dimer, quantitative (not at [facility])  Result Value Ref Range   D-Dimer, Quant 5.03 (H) 0.00 - 0.50 ug/mL-FEU  Glucose, capillary  Result Value Ref Range   Glucose-Capillary 324 (H) 70 - 99 mg/dL  Glucose, capillary  Result Value Ref Range   Glucose-Capillary 358 (H) 70 - 99 mg/dL  Glucose, capillary  Result Value Ref Range   Glucose-Capillary 253 (H) 70 - 99 mg/dL  Glucose, capillary  Result Value Ref Range   Glucose-Capillary 381 (H) 70 - 99 mg/dL  Glucose, capillary  Result Value Ref Range   Glucose-Capillary 319 (H) 70 - 99 mg/dL  Glucose, capillary  Result Value Ref Range   Glucose-Capillary 309 (H) 70 - 99 mg/dL  Glucose, capillary  Result Value Ref Range   Glucose-Capillary 195 (H) 70 - 99 mg/dL  Glucose, capillary  Result Value Ref Range   Glucose-Capillary 318 (H) 70 - 99 mg/dL  Glucose, capillary  Result  Value Ref Range   Glucose-Capillary 365 (H) 70 - 99 mg/dL  CBC with Differential/Platelet  Result Value Ref Range   WBC 8.6 4.0 - 10.5 K/uL   RBC 4.78 4.22 - 5.81 MIL/uL   Hemoglobin 14.2 13.0 - 17.0 g/dL   HCT 42.1 39 - 52 %   MCV 88.1 80.0 - 100.0 fL   MCH 29.7 26.0 - 34.0 pg   MCHC 33.7 30.0 - 36.0 g/dL   RDW 13.4 11.5 - 15.5 %   Platelets 243 150 - 400 K/uL   nRBC 0.0 0.0 - 0.2 %   Neutrophils Relative % 83 %   Neutro Abs 7.2 1.7 - 7.7 K/uL   Lymphocytes Relative 9 %   Lymphs Abs 0.7 0.7 - 4.0 K/uL   Monocytes Relative 5 %   Monocytes Absolute 0.4 0 - 1 K/uL   Eosinophils Relative 1 %   Eosinophils Absolute 0.1 0 - 0 K/uL   Basophils Relative 0 %   Basophils Absolute 0.0 0 - 0 K/uL   Immature Granulocytes 2 %   Abs Immature Granulocytes 0.18 (H) 0.00 - 0.07 K/uL  Comprehensive metabolic panel  Result Value Ref Range   Sodium 136 135 - 145 mmol/L   Potassium 4.2 3.5 - 5.1 mmol/L   Chloride 98 98 - 111 mmol/L   CO2 28 22 - 32 mmol/L   Glucose, Bld 247 (H) 70 - 99 mg/dL   BUN 28 (H) 8 - 23 mg/dL   Creatinine, Ser 0.75 0.61 - 1.24 mg/dL    Calcium 8.4 (L) 8.9 - 10.3 mg/dL   Total Protein 5.5 (L) 6.5 - 8.1 g/dL   Albumin 2.6 (L) 3.5 - 5.0 g/dL   AST 24 15 - 41 U/L   ALT 54 (H) 0 - 44 U/L   Alkaline Phosphatase 53 38 - 126 U/L   Total Bilirubin 1.3 (H) 0.3 - 1.2 mg/dL   GFR calc non Af Amer >60 >60 mL/min   GFR calc Af Amer >60 >60 mL/min   Anion gap 10 5 - 15  Magnesium  Result Value Ref Range   Magnesium 2.2 1.7 - 2.4 mg/dL  Phosphorus  Result Value Ref Range   Phosphorus 2.9 2.5 - 4.6 mg/dL  D-dimer, quantitative (not at [facility])  Result Value Ref Range   D-Dimer, Quant 3.12 (H) 0.00 - 0.50 ug/mL-FEU  Glucose, capillary  Result Value Ref Range   Glucose-Capillary 379 (H) 70 - 99 mg/dL  Glucose, capillary  Result Value Ref Range   Glucose-Capillary 186 (H) 70 - 99 mg/dL  Glucose, capillary  Result Value Ref Range   Glucose-Capillary 398 (H) 70 - 99 mg/dL  Glucose, capillary  Result Value Ref Range   Glucose-Capillary 314 (H) 70 - 99 mg/dL  CBG monitoring, ED  Result Value Ref Range   Glucose-Capillary 150 (H) 70 - 99 mg/dL  CBG monitoring, ED  Result Value Ref Range   Glucose-Capillary 215 (H) 70 - 99 mg/dL  CBG monitoring, ED  Result Value Ref Range   Glucose-Capillary 188 (H) 70 - 99 mg/dL  CBG monitoring, ED  Result Value Ref Range   Glucose-Capillary 237 (H) 70 - 99 mg/dL  CBG monitoring, ED  Result Value Ref Range   Glucose-Capillary 294 (H) 70 - 99 mg/dL  ABO/Rh  Result Value Ref Range   ABO/RH(D)      O POS Performed at [facility], [place] [address], [place], [place] [address]  Assessment & Plan:   Problem List Items Addressed This Visit    COVID-19 - Primary    Other Visit Diagnoses    Pneumonia due to COVID-19 virus       Acute respiratory failure with hypoxia ([place])          COVID19 infection and pneumonia secondary - RESOLVING Acute respiratory failure with hypoxia - RESOLVED - remains Off supplemental O2  Clinically improved now after 10 day steroid  course and treatment inpatient for COVID He has oxygen but no longer needing, pulse ox >96% at rest and with exertion Has lingering dry nose with congestion, advised nasal saline and neosporin topical No further treatment needed for COVID, no further steroid or oxygen needed   No orders of the defined types were placed in this encounter.    Follow up plan: Return in about 2 weeks (around 08/19/2020) for 1 week fasting lab then 1 week later physical.   Labs ordered for 08/12/20 including Urinalysis  Patient verbalizes understanding with the above medical recommendations including the limitation of remote medical advice.  Specific follow-up and call-back criteria were given for patient to follow-up or seek medical care more urgently if needed.  Total duration of direct patient care provided via video conference: 15 minutes   Nobie Putnam, [facility] 08/05/2020, 11:21 AM

## 2020-08-11 ENCOUNTER — Other Ambulatory Visit: Payer: Self-pay | Admitting: *Deleted

## 2020-08-11 DIAGNOSIS — R3914 Feeling of incomplete bladder emptying: Secondary | ICD-10-CM

## 2020-08-11 DIAGNOSIS — Z0000 Encounter for general adult medical examination without abnormal findings: Secondary | ICD-10-CM

## 2020-08-11 DIAGNOSIS — N401 Enlarged prostate with lower urinary tract symptoms: Secondary | ICD-10-CM

## 2020-08-11 DIAGNOSIS — E785 Hyperlipidemia, unspecified: Secondary | ICD-10-CM

## 2020-08-11 DIAGNOSIS — Z794 Long term (current) use of insulin: Secondary | ICD-10-CM

## 2020-08-11 DIAGNOSIS — I10 Essential (primary) hypertension: Secondary | ICD-10-CM

## 2020-08-11 DIAGNOSIS — E1169 Type 2 diabetes mellitus with other specified complication: Secondary | ICD-10-CM

## 2020-08-11 DIAGNOSIS — Z1159 Encounter for screening for other viral diseases: Secondary | ICD-10-CM

## 2020-08-12 ENCOUNTER — Other Ambulatory Visit: Payer: Medicare HMO

## 2020-08-12 ENCOUNTER — Other Ambulatory Visit: Payer: Self-pay

## 2020-08-13 LAB — CBC WITH DIFFERENTIAL/PLATELET
Absolute Monocytes: 785 {cells}/uL (ref 200–950)
Basophils Absolute: 31 {cells}/uL (ref 0–200)
Basophils Relative: 0.6 %
Eosinophils Absolute: 250 {cells}/uL (ref 15–500)
Eosinophils Relative: 4.9 %
HCT: 40 % (ref 38.5–50.0)
Hemoglobin: 13.3 g/dL (ref 13.2–17.1)
Lymphs Abs: 1484 {cells}/uL (ref 850–3900)
MCH: 30.9 pg (ref 27.0–33.0)
MCHC: 33.3 g/dL (ref 32.0–36.0)
MCV: 93 fL (ref 80.0–100.0)
MPV: 9 fL (ref 7.5–12.5)
Monocytes Relative: 15.4 %
Neutro Abs: 2550 {cells}/uL (ref 1500–7800)
Neutrophils Relative %: 50 %
Platelets: 105 Thousand/uL — ABNORMAL LOW (ref 140–400)
RBC: 4.3 Million/uL (ref 4.20–5.80)
RDW: 15.2 % — ABNORMAL HIGH (ref 11.0–15.0)
Total Lymphocyte: 29.1 %
WBC: 5.1 Thousand/uL (ref 3.8–10.8)

## 2020-08-13 LAB — TSH: TSH: 3.18 mIU/L (ref 0.40–4.50)

## 2020-08-13 LAB — COMPLETE METABOLIC PANEL WITHOUT GFR
AG Ratio: 1.5 (calc) (ref 1.0–2.5)
ALT: 15 U/L (ref 9–46)
AST: 14 U/L (ref 10–35)
Albumin: 3.7 g/dL (ref 3.6–5.1)
Alkaline phosphatase (APISO): 58 U/L (ref 35–144)
BUN: 11 mg/dL (ref 7–25)
CO2: 24 mmol/L (ref 20–32)
Calcium: 8.8 mg/dL (ref 8.6–10.3)
Chloride: 104 mmol/L (ref 98–110)
Creat: 0.71 mg/dL (ref 0.70–1.25)
GFR, Est African American: 112 mL/min/1.73m2 (ref >=60)
GFR, Est Non African American: 96 mL/min/1.73m2 (ref >=60)
Globulin: 2.5 g/dL (ref 1.9–3.7)
Glucose, Bld: 81 mg/dL (ref 65–99)
Potassium: 3.9 mmol/L (ref 3.5–5.3)
Sodium: 139 mmol/L (ref 135–146)
Total Bilirubin: 0.5 mg/dL (ref 0.2–1.2)
Total Protein: 6.2 g/dL (ref 6.1–8.1)

## 2020-08-13 LAB — LIPID PANEL
Cholesterol: 159 mg/dL (ref <200)
HDL: 42 mg/dL (ref >=40)
LDL Cholesterol (Calc): 93 mg/dL
Non-HDL Cholesterol (Calc): 117 mg/dL (ref <130)
Total CHOL/HDL Ratio: 3.8 (calc) (ref <5.0)
Triglycerides: 139 mg/dL (ref <150)

## 2020-08-13 LAB — HEMOGLOBIN A1C
Hgb A1c MFr Bld: 8.3 % of total Hgb — ABNORMAL HIGH (ref <5.7)
Mean Plasma Glucose: 192 (calc)
eAG (mmol/L): 10.6 (calc)

## 2020-08-13 LAB — URINALYSIS, ROUTINE W REFLEX MICROSCOPIC
Bacteria, UA: NONE SEEN /HPF
Bilirubin Urine: NEGATIVE
Hgb urine dipstick: NEGATIVE
Hyaline Cast: NONE SEEN /LPF
Ketones, ur: NEGATIVE
Leukocytes,Ua: NEGATIVE
Nitrite: NEGATIVE
Specific Gravity, Urine: 1.035 (ref 1.001–1.03)
Squamous Epithelial / HPF: NONE SEEN /HPF (ref <=5)
WBC, UA: NONE SEEN /HPF (ref 0–5)
pH: <=5 (ref 5.0–8.0)

## 2020-08-13 LAB — PSA: PSA: 0.15 ng/mL (ref <=4.0)

## 2020-08-13 LAB — HEPATITIS C ANTIBODY
Hepatitis C Ab: NONREACTIVE
SIGNAL TO CUT-OFF: 0.04 (ref <1.00)

## 2020-08-15 ENCOUNTER — Other Ambulatory Visit: Payer: Self-pay

## 2020-08-15 ENCOUNTER — Encounter (INDEPENDENT_AMBULATORY_CARE_PROVIDER_SITE_OTHER): Payer: Self-pay | Admitting: Vascular Surgery

## 2020-08-15 ENCOUNTER — Ambulatory Visit (INDEPENDENT_AMBULATORY_CARE_PROVIDER_SITE_OTHER): Payer: Medicare HMO | Admitting: Vascular Surgery

## 2020-08-15 VITALS — BP 118/70 | HR 93 | Resp 16 | Wt 222.0 lb

## 2020-08-15 DIAGNOSIS — I83813 Varicose veins of bilateral lower extremities with pain: Secondary | ICD-10-CM | POA: Diagnosis not present

## 2020-08-15 DIAGNOSIS — Z794 Long term (current) use of insulin: Secondary | ICD-10-CM | POA: Diagnosis not present

## 2020-08-15 DIAGNOSIS — E1169 Type 2 diabetes mellitus with other specified complication: Secondary | ICD-10-CM | POA: Diagnosis not present

## 2020-08-15 NOTE — Progress Notes (Signed)
MRN : [id number]  HASKEL DEWALT is a 69 y.o. (1951-01-27) male who presents with chief complaint of  Chief Complaint  Patient presents with  . Follow-up    4wk post laser  .  History of Present Illness: Patient returns today in follow up of his venous insufficiency.  He is status post right great saphenous vein laser ablation several weeks ago.  This has improved the pain and swelling in the right leg, but he is still bothered by painful varicosities on the medial thigh and calf area.  These have improved but not resolved after laser ablation.  He had a successful ablation without DVT on postprocedural duplex.  His bruising and soreness lasted about a week after the procedure but has resolved.  Current Outpatient Medications  Medication Sig Dispense Refill  . ascorbic acid (VITAMIN C) 500 MG tablet Take 500 mg by mouth daily.    Marland Kitchen aspirin 81 MG chewable tablet Chew 81 mg by mouth daily.     Marland Kitchen atorvastatin (LIPITOR) 10 MG tablet Take 1 tablet (10 mg total) by mouth daily at 6 PM. 90 tablet 1  . b complex vitamins tablet Take 1 tablet by mouth daily.    Marland Kitchen CRANBERRY FRUIT PO Take 1 tablet by mouth daily.     . finasteride (PROSCAR) 5 MG tablet TAKE 1 TABLET BY MOUTH EVERY DAY 90 tablet 1  . glucose blood test strip Check blood sugar up to 4 times daily 400 each 3  . insulin NPH Human (NOVOLIN N) 100 UNIT/ML injection Inject 0.4 mLs (40 Units total) into the skin at bedtime. May use reduced dose as advised. (Patient taking differently: Inject 30-45 Units into the skin at bedtime as needed (high blood sugar). May use reduced dose as advised.) 40 mL 1  . JARDIANCE 25 MG TABS tablet TAKE 1 TABLET BY MOUTH EVERY DAY 90 tablet 1  . levalbuterol (XOPENEX HFA) 45 MCG/ACT inhaler Inhale 1-2 puffs into the lungs every 6 (six) hours as needed for wheezing. 15 g 0  . metFORMIN (GLUCOPHAGE) 1000 MG tablet Take 1 tablet (1,000 mg total) by mouth 2 (two) times daily with a meal. 180 tablet 1  .  OZEMPIC, 1 MG/DOSE, 2 MG/1.5ML SOPN Inject 1 mg into the skin once a week. 2 pen 5  . tamsulosin (FLOMAX) 0.4 MG CAPS capsule Take 1 capsule (0.4 mg total) by mouth daily after supper. 90 capsule 1  . valsartan (DIOVAN) 80 MG tablet Take 1 tablet (80 mg total) by mouth daily. 90 tablet 3  . vitamin E 400 UNIT capsule Take 400 Units by mouth daily.     No current facility-administered medications for this visit.    Past Medical History:  Diagnosis Date  . Allergy   . BPH (benign prostatic hyperplasia)   . Colon polyps   . Diabetes ([place])   . Diverticulosis   . Gallstones   . GERD (gastroesophageal reflux disease)   . HOH (hard of hearing)   . Kidney stones   . Renal disorder   . Wears glasses     Past Surgical History:  Procedure Laterality Date  . CARPAL TUNNEL RELEASE    . CHOLECYSTECTOMY    . COLONOSCOPY WITH PROPOFOL N/A 02/06/2020   Procedure: COLONOSCOPY WITH PROPOFOL;  Surgeon: Lin Landsman, MD;  Location: [facility] ENDOSCOPY;  Service: Gastroenterology;  Laterality: N/A;  . COLONOSCOPY WITH PROPOFOL N/A 03/31/2020   Procedure: COLONOSCOPY WITH PROPOFOL;  Surgeon: Rush Landmark Telford Nab., MD;  Location: [facility] ENDOSCOPY;  Service: Gastroenterology;  Laterality: N/A;  . ENDOSCOPIC MUCOSAL RESECTION N/A 03/31/2020   Procedure: ENDOSCOPIC MUCOSAL RESECTION;  Surgeon: Rush Landmark Telford Nab., MD;  Location: [place];  Service: Gastroenterology;  Laterality: N/A;  . HEMOSTASIS CLIP PLACEMENT  03/31/2020   Procedure: HEMOSTASIS CLIP PLACEMENT;  Surgeon: Irving Copas., MD;  Location: [place];  Service: Gastroenterology;;  . HERNIA REPAIR    . POLYPECTOMY  03/31/2020   Procedure: POLYPECTOMY;  Surgeon: Mansouraty, Telford Nab., MD;  Location: [place];  Service: Gastroenterology;;  . SMALL INTESTINE SURGERY    . SUBMUCOSAL LIFTING INJECTION  03/31/2020   Procedure: SUBMUCOSAL LIFTING INJECTION;  Surgeon: Rush Landmark Telford Nab., MD;  Location[facility] ENDOSCOPY;   Service: Gastroenterology;;     Social History   Tobacco Use  . Smoking status: Former Smoker    Types: Cigarettes    Quit date: 1970    Years since quitting: 51.7  . Smokeless tobacco: Former Systems developer    Types: Snuff  Vaping Use  . Vaping Use: Never used  Substance Use Topics  . Alcohol use: Not Currently  . Drug use: Not Currently    Comment: past      Family History  Problem Relation Age of Onset  . Heart disease Mother 4  . Heart disease Father   . Stroke Father 60  . Diabetes Father   . Heart attack Father   . Liver disease Neg Hx   . Colon cancer Neg Hx   . Esophageal cancer Neg Hx   . Pancreatic cancer Neg Hx   . Stomach cancer Neg Hx   . Inflammatory bowel disease Neg Hx   . Rectal cancer Neg Hx      Allergies  Allergen Reactions  . Testosterone Rash    patch     REVIEW OF SYSTEMS (Negative unless checked)  Constitutional: [] Weight loss  [] Fever  [] Chills Cardiac: [] Chest pain   [] Chest pressure   [] Palpitations   [] Shortness of breath when laying flat   [] Shortness of breath at rest   [] Shortness of breath with exertion. Vascular:  [] Pain in legs with walking   [] Pain in legs at rest   [] Pain in legs when laying flat   [] Claudication   [] Pain in feet when walking  [] Pain in feet at rest  [] Pain in feet when laying flat   [] History of DVT   [] Phlebitis   [x] Swelling in legs   [x] Varicose veins   [] Non-healing ulcers Pulmonary:   [] Uses home oxygen   [] Productive cough   [] Hemoptysis   [] Wheeze  [] COPD   [] Asthma Neurologic:  [] Dizziness  [] Blackouts   [] Seizures   [] History of stroke   [] History of TIA  [] Aphasia   [] Temporary blindness   [] Dysphagia   [] Weakness or numbness in arms   [] Weakness or numbness in legs Musculoskeletal:  [] Arthritis   [] Joint swelling   [] Joint pain   [] Low back pain Hematologic:  [] Easy bruising  [] Easy bleeding   [] Hypercoagulable state   [] Anemic   Gastrointestinal:  [] Blood in stool   [] Vomiting blood  [] Gastroesophageal  reflux/heartburn   [] Abdominal pain Genitourinary:  [] Chronic kidney disease   [] Difficult urination  [x] Frequent urination  [] Burning with urination   [] Hematuria Skin:  [] Rashes   [] Ulcers   [] Wounds Psychological:  [] History of anxiety   []  History of major depression.  Physical Examination  BP 118/70 (BP Location: Right Arm)   Pulse 93   Resp 16   Wt 222 lb (100.7 kg)  BMI 31.85 kg/m  Gen:  WD/WN, NAD Head: [place]/AT, No temporalis wasting. Ear/Nose/Throat: Hearing grossly intact, nares w/o erythema or drainage Eyes: Conjunctiva clear. Sclera non-icteric Neck: Supple.  Trachea midline Pulmonary:  Good air movement, no use of accessory muscles.  Cardiac: RRR, no JVD Vascular: Prominent varicosities on the right medial thigh and calf area measuring up to 3 mm in diameter.  Some varicosities present on the left but smaller and less prominent Vessel Right Left  Radial Palpable Palpable                          PT Palpable Palpable  DP Palpable Palpable   Gastrointestinal: soft, non-tender/non-distended. No guarding/reflex.  Musculoskeletal: M/S 5/5 throughout.  No deformity or atrophy.  No edema. Neurologic: Sensation grossly intact in extremities.  Symmetrical.  Speech is fluent.  Psychiatric: Judgment intact, Mood & affect appropriate for pt's clinical situation. Dermatologic: No rashes or ulcers noted.  No cellulitis or open wounds.       Labs Recent Results (from the past 2160 hour(s))  Ferritin     Status: Abnormal   Collection Time: 07/16/20  1:49 PM  Result Value Ref Range   Ferritin 1,283 (H) 24 - 336 ng/mL    Comment: Performed at [facility], [place] [address] [place], [place] [address]  Triglycerides     Status: None   Collection Time: 07/16/20  1:49 PM  Result Value Ref Range   Triglycerides 119 <150 mg/dL    Comment: Performed at [facility], [place] [address], [place], [place] [address]  C-reactive protein      Status: Abnormal   Collection Time: 07/16/20  1:49 PM  Result Value Ref Range   CRP 19.5 (H) <1.0 mg/dL    Comment: Performed at [facility], [place] [address] [place], [place] [address]  Lactic acid, plasma     Status: None   Collection Time: 07/16/20  1:58 PM  Result Value Ref Range   Lactic Acid, Venous 1.3 0.5 - 1.9 mmol/L    Comment: Performed at [facility], [place] [address] [place], [place] [address]  Blood Culture (routine x 2)     Status: None   Collection Time: 07/16/20  1:58 PM   Specimen: BLOOD RIGHT FOREARM  Result Value Ref Range   Specimen Description      BLOOD RIGHT FOREARM Performed at [place] [address], [place], [place] [address]    Special Requests      BOTTLES DRAWN AEROBIC AND ANAEROBIC Blood Culture adequate volume Performed at [place] [address], [place], [place] [address]    Culture      NO GROWTH 5 DAYS Performed at [facility], [place] [address], [place], [place] [address]    Report Status 07/21/2020 FINAL   Blood Culture (routine x 2)     Status: None   Collection Time: 07/16/20  1:58 PM   Specimen: BLOOD  Result Value Ref Range   Specimen Description      BLOOD RIGHT ANTECUBITAL Performed at [place] [address], [place], [place] [address]    Special Requests      BOTTLES DRAWN AEROBIC AND ANAEROBIC Blood Culture adequate volume Performed at [place] [address], [place], [place] [address]    Culture      NO GROWTH 5 DAYS Performed at [facility], [address], [place], [place] [address]    Report Status 07/21/2020 FINAL   CBC WITH DIFFERENTIAL     Status: None   Collection Time: 07/16/20  1:58 PM  Result Value Ref Range   WBC 6.5 4.0 - 10.5 K/uL   RBC 5.41 4.22 - 5.81 MIL/uL   Hemoglobin 16.0 13.0 - 17.0 g/dL   HCT 47.6 39 - 52 %   MCV 88.0 80.0 - 100.0 fL   MCH 29.6 26.0 -  34.0 pg   MCHC 33.6 30.0 - 36.0 g/dL   RDW 14.7 11.5 - 15.5 %   Platelets 200 150 - 400 K/uL   nRBC 0.0 0.0 - 0.2 %   Neutrophils Relative % 81 %   Neutro Abs 5.2 1.7 - 7.7 K/uL   Lymphocytes Relative 12 %   Lymphs Abs 0.8 0.7 - 4.0 K/uL   Monocytes Relative 6 %   Monocytes Absolute 0.4 0 - 1 K/uL   Eosinophils Relative 0 %   Eosinophils Absolute 0.0 0 - 0 K/uL   Basophils Relative 0 %   Basophils Absolute 0.0 0 - 0 K/uL   Immature Granulocytes 1 %   Abs Immature Granulocytes 0.04 0.00 - 0.07 K/uL    Comment: Performed at [facility], [place] [address], [place], [place] [address]  Comprehensive metabolic panel     Status: Abnormal   Collection Time: 07/16/20  1:58 PM  Result Value Ref Range   Sodium 132 (L) 135 - 145 mmol/L   Potassium 4.1 3.5 - 5.1 mmol/L   Chloride 96 (L) 98 - 111 mmol/L   CO2 18 (L) 22 - 32 mmol/L   Glucose, Bld 130 (H) 70 - 99 mg/dL    Comment: Glucose reference range applies only to samples taken after fasting for at least 8 hours.   BUN 24 (H) 8 - 23 mg/dL   Creatinine, Ser 0.97 0.61 - 1.24 mg/dL   Calcium 9.5 8.9 - 10.3 mg/dL   Total Protein 7.9 6.5 - 8.1 g/dL   Albumin 3.2 (L) 3.5 - 5.0 g/dL   AST 61 (H) 15 - 41 U/L   ALT 38 0 - 44 U/L   Alkaline Phosphatase 51 38 - 126 U/L   Total Bilirubin 1.2 0.3 - 1.2 mg/dL   GFR calc non Af Amer >60 >60 mL/min   GFR calc Af Amer >60 >60 mL/min   Anion gap 18 (H) 5 - 15    Comment: Performed at [facility], [place] [address], [place], [place] [address]  D-dimer, quantitative     Status: Abnormal   Collection Time: 07/16/20  1:58 PM  Result Value Ref Range   D-Dimer, Quant 4.59 (H) 0.00 - 0.50 ug/mL-FEU    Comment: (NOTE) At the manufacturer cut-off of 0.50 ug/mL FEU, this assay has been documented to exclude PE with a sensitivity and negative predictive value of 97 to 99%.  At this time, this assay has not been approved by the FDA to exclude DVT/VTE. Results should be  correlated with clinical presentation. Performed at [facility], [place] [address] [place], [place] [address]   Procalcitonin     Status: None   Collection Time: 07/16/20  1:58 PM  Result Value Ref Range   Procalcitonin <0.10 ng/mL    Comment:        Interpretation: PCT (Procalcitonin) <= 0.5 ng/mL: Systemic infection (sepsis) is not likely. Local bacterial infection is possible. (NOTE)  Sepsis PCT Algorithm           Lower Respiratory Tract                                      Infection PCT Algorithm    ----------------------------     ----------------------------         PCT < 0.25 ng/mL                PCT < 0.10 ng/mL          Strongly encourage             Strongly discourage   discontinuation of antibiotics    initiation of antibiotics    ----------------------------     -----------------------------       PCT 0.25 - 0.50 ng/mL            PCT 0.10 - 0.25 ng/mL               OR       >80% decrease in PCT            Discourage initiation of                                            antibiotics      Encourage discontinuation           of antibiotics    ----------------------------     -----------------------------         PCT >= 0.50 ng/mL              PCT 0.26 - 0.50 ng/mL               AND        <80% decrease in PCT             Encourage initiation of                                             antibiotics       Encourage continuation           of antibiotics    ----------------------------     -----------------------------        PCT >= 0.50 ng/mL                  PCT > 0.50 ng/mL               AND         increase in PCT                  Strongly encourage                                      initiation of antibiotics    Strongly encourage escalation           of antibiotics                                     -----------------------------  PCT <= 0.25 ng/mL                                                  OR                                        > 80% decrease in PCT                                      Discontinue / Do not initiate                                             antibiotics  Performed at [place] [address], [place], [place] [address]   Lactate dehydrogenase     Status: Abnormal   Collection Time: 07/16/20  1:58 PM  Result Value Ref Range   LDH 524 (H) 98 - 192 U/L    Comment: Performed at [facility], [place] [address], [place], [place] [address]  Fibrinogen     Status: Abnormal   Collection Time: 07/16/20  1:58 PM  Result Value Ref Range   Fibrinogen 729 (H) 210 - 475 mg/dL    Comment: Performed at [facility], [place] [address] [place], [place] [address]  ABO/Rh     Status: None   Collection Time: 07/16/20  1:58 PM  Result Value Ref Range   ABO/RH(D)      O POS Performed at [facility], [place] [address] [place], [place] [address]   CBG monitoring, ED     Status: Abnormal   Collection Time: 07/16/20  6:25 PM  Result Value Ref Range   Glucose-Capillary 150 (H) 70 - 99 mg/dL    Comment: Glucose reference range applies only to samples taken after fasting for at least 8 hours.  CBG monitoring, ED     Status: Abnormal   Collection Time: 07/16/20 10:07 PM  Result Value Ref Range   Glucose-Capillary 215 (H) 70 - 99 mg/dL    Comment: Glucose reference range applies only to samples taken after fasting for at least 8 hours.  Hemoglobin A1c     Status: Abnormal   Collection Time: 07/17/20  5:39 AM  Result Value Ref Range   Hgb A1c MFr Bld 7.6 (H) 4.8 - 5.6 %    Comment: (NOTE) Pre diabetes:          5.7%-6.4%  Diabetes:              >6.4%  Glycemic control for   <7.0% adults with diabetes    Mean Plasma Glucose 171.42 mg/dL    Comment: Performed at [place] [address] [place], [place] [address]  HIV Antibody (routine testing w rflx)     Status: None   Collection  Time: 07/17/20  5:39 AM  Result Value Ref Range   HIV Screen 4th Generation wRfx Non Reactive Non Reactive    Comment: Performed at [facility], [place] [address], [place], [place] [address]  CBC  with Differential/Platelet     Status: Abnormal   Collection Time: 07/17/20  5:39 AM  Result Value Ref Range   WBC 4.1 4.0 - 10.5 K/uL   RBC 4.66 4.22 - 5.81 MIL/uL   Hemoglobin 14.2 13.0 - 17.0 g/dL   HCT 41.7 39 - 52 %   MCV 89.5 80.0 - 100.0 fL   MCH 30.5 26.0 - 34.0 pg   MCHC 34.1 30.0 - 36.0 g/dL   RDW 14.7 11.5 - 15.5 %   Platelets 193 150 - 400 K/uL   nRBC 0.0 0.0 - 0.2 %   Neutrophils Relative % 79 %   Neutro Abs 3.2 1.7 - 7.7 K/uL   Lymphocytes Relative 13 %   Lymphs Abs 0.5 (L) 0.7 - 4.0 K/uL   Monocytes Relative 6 %   Monocytes Absolute 0.3 0 - 1 K/uL   Eosinophils Relative 0 %   Eosinophils Absolute 0.0 0 - 0 K/uL   Basophils Relative 0 %   Basophils Absolute 0.0 0 - 0 K/uL   Immature Granulocytes 2 %   Abs Immature Granulocytes 0.06 0.00 - 0.07 K/uL   Reactive, Benign Lymphocytes PRESENT     Comment: Performed at [facility], [place] [address], [place], [place] [address]  Comprehensive metabolic panel     Status: Abnormal   Collection Time: 07/17/20  5:39 AM  Result Value Ref Range   Sodium 134 (L) 135 - 145 mmol/L   Potassium 4.3 3.5 - 5.1 mmol/L   Chloride 103 98 - 111 mmol/L   CO2 16 (L) 22 - 32 mmol/L   Glucose, Bld 210 (H) 70 - 99 mg/dL    Comment: Glucose reference range applies only to samples taken after fasting for at least 8 hours.   BUN 30 (H) 8 - 23 mg/dL   Creatinine, Ser 0.77 0.61 - 1.24 mg/dL   Calcium 8.4 (L) 8.9 - 10.3 mg/dL   Total Protein 6.5 6.5 - 8.1 g/dL   Albumin 2.6 (L) 3.5 - 5.0 g/dL   AST 46 (H) 15 - 41 U/L   ALT 34 0 - 44 U/L   Alkaline Phosphatase 47 38 - 126 U/L   Total Bilirubin 0.8 0.3 - 1.2 mg/dL   GFR calc non Af Amer >60 >60 mL/min   GFR calc Af Amer >60 >60 mL/min   Anion gap 15 5 - 15    Comment:  Performed at [facility], [place] [address] [place], [place] [address]  C-reactive protein     Status: Abnormal   Collection Time: 07/17/20  5:39 AM  Result Value Ref Range   CRP 15.1 (H) <1.0 mg/dL    Comment: Performed at [facility], [place] [address], [place], [place] [address]  D-dimer, quantitative (not at [facility])     Status: Abnormal   Collection Time: 07/17/20  5:39 AM  Result Value Ref Range   D-Dimer, Quant 4.26 (H) 0.00 - 0.50 ug/mL-FEU    Comment: (NOTE) At the manufacturer cut-off of 0.50 ug/mL FEU, this assay has been documented to exclude PE with a sensitivity and negative predictive value of 97 to 99%.  At this time, this assay has not been approved by the FDA to exclude DVT/VTE. Results should be correlated with clinical presentation. Performed at [facility], [place] [address] [place],  [address]   CBG monitoring, ED     Status: Abnormal   Collection Time: 07/17/20  9:06 AM  Result Value  Ref Range   Glucose-Capillary 188 (H) 70 - 99 mg/dL    Comment: Glucose reference range applies only to samples taken after fasting for at least 8 hours.  CBG monitoring, ED     Status: Abnormal   Collection Time: 07/17/20 12:05 PM  Result Value Ref Range   Glucose-Capillary 237 (H) 70 - 99 mg/dL    Comment: Glucose reference range applies only to samples taken after fasting for at least 8 hours.  CBG monitoring, ED     Status: Abnormal   Collection Time: 07/17/20  4:35 PM  Result Value Ref Range   Glucose-Capillary 294 (H) 70 - 99 mg/dL    Comment: Glucose reference range applies only to samples taken after fasting for at least 8 hours.  Glucose, capillary     Status: Abnormal   Collection Time: 07/17/20  8:45 PM  Result Value Ref Range   Glucose-Capillary 276 (H) 70 - 99 mg/dL    Comment: Glucose reference range applies only to samples taken after fasting for at least 8 hours.  CBC with Differential/Platelet      Status: Abnormal   Collection Time: 07/18/20  4:39 AM  Result Value Ref Range   WBC 6.6 4.0 - 10.5 K/uL   RBC 4.85 4.22 - 5.81 MIL/uL   Hemoglobin 14.6 13.0 - 17.0 g/dL   HCT 43.2 39 - 52 %   MCV 89.1 80.0 - 100.0 fL   MCH 30.1 26.0 - 34.0 pg   MCHC 33.8 30.0 - 36.0 g/dL   RDW 14.6 11.5 - 15.5 %   Platelets 235 150 - 400 K/uL   nRBC 0.0 0.0 - 0.2 %   Neutrophils Relative % 74 %   Neutro Abs 4.8 1.7 - 7.7 K/uL   Lymphocytes Relative 18 %   Lymphs Abs 1.2 0.7 - 4.0 K/uL   Monocytes Relative 7 %   Monocytes Absolute 0.5 0 - 1 K/uL   Eosinophils Relative 0 %   Eosinophils Absolute 0.0 0 - 0 K/uL   Basophils Relative 0 %   Basophils Absolute 0.0 0 - 0 K/uL   Immature Granulocytes 1 %   Abs Immature Granulocytes 0.09 (H) 0.00 - 0.07 K/uL   Reactive, Benign Lymphocytes PRESENT     Comment: Performed at [facility], [place] [address], [place], [place] [address]  Comprehensive metabolic panel     Status: Abnormal   Collection Time: 07/18/20  4:39 AM  Result Value Ref Range   Sodium 135 135 - 145 mmol/L   Potassium 4.7 3.5 - 5.1 mmol/L   Chloride 104 98 - 111 mmol/L   CO2 19 (L) 22 - 32 mmol/L   Glucose, Bld 276 (H) 70 - 99 mg/dL    Comment: Glucose reference range applies only to samples taken after fasting for at least 8 hours.   BUN 30 (H) 8 - 23 mg/dL   Creatinine, Ser 0.80 0.61 - 1.24 mg/dL   Calcium 8.2 (L) 8.9 - 10.3 mg/dL   Total Protein 6.2 (L) 6.5 - 8.1 g/dL   Albumin 2.6 (L) 3.5 - 5.0 g/dL   AST 40 15 - 41 U/L   ALT 36 0 - 44 U/L   Alkaline Phosphatase 46 38 - 126 U/L   Total Bilirubin 0.8 0.3 - 1.2 mg/dL   GFR calc non Af Amer >60 >60 mL/min   GFR calc Af Amer >60 >60 mL/min   Anion gap 12 5 - 15    Comment: Performed at  [facility], [place] [address], [place], [place] [address]  C-reactive protein     Status: Abnormal   Collection Time: 07/18/20  4:39 AM  Result Value Ref Range   CRP 7.9 (H) <1.0 mg/dL    Comment: Performed at  [facility], [place] [address], [place], [place] [address]  D-dimer, quantitative (not at [facility])     Status: Abnormal   Collection Time: 07/18/20  4:39 AM  Result Value Ref Range   D-Dimer, Quant 3.57 (H) 0.00 - 0.50 ug/mL-FEU    Comment: (NOTE) At the manufacturer cut-off of 0.50 ug/mL FEU, this assay has been documented to exclude PE with a sensitivity and negative predictive value of 97 to 99%.  At this time, this assay has not been approved by the FDA to exclude DVT/VTE. Results should be correlated with clinical presentation. Performed at [facility], [place] [address], [place], [place] [address]   Glucose, capillary     Status: Abnormal   Collection Time: 07/18/20  8:02 AM  Result Value Ref Range   Glucose-Capillary 270 (H) 70 - 99 mg/dL    Comment: Glucose reference range applies only to samples taken after fasting for at least 8 hours.  Glucose, capillary     Status: Abnormal   Collection Time: 07/18/20 11:41 AM  Result Value Ref Range   Glucose-Capillary 283 (H) 70 - 99 mg/dL    Comment: Glucose reference range applies only to samples taken after fasting for at least 8 hours.  Glucose, capillary     Status: Abnormal   Collection Time: 07/18/20  4:56 PM  Result Value Ref Range   Glucose-Capillary 357 (H) 70 - 99 mg/dL    Comment: Glucose reference range applies only to samples taken after fasting for at least 8 hours.  Glucose, capillary     Status: Abnormal   Collection Time: 07/18/20  8:15 PM  Result Value Ref Range   Glucose-Capillary 415 (H) 70 - 99 mg/dL    Comment: Glucose reference range applies only to samples taken after fasting for at least 8 hours.  CBC with Differential/Platelet     Status: Abnormal   Collection Time: 07/19/20  4:55 AM  Result Value Ref Range   WBC 8.4 4.0 - 10.5 K/uL   RBC 5.08 4.22 - 5.81 MIL/uL   Hemoglobin 15.1 13.0 - 17.0 g/dL   HCT 45.5 39 - 52 %   MCV 89.6 80.0 - 100.0 fL   MCH 29.7 26.0 - 34.0 pg     MCHC 33.2 30.0 - 36.0 g/dL   RDW 14.2 11.5 - 15.5 %   Platelets 269 150 - 400 K/uL   nRBC 0.0 0.0 - 0.2 %   Neutrophils Relative % 83 %   Neutro Abs 7.0 1.7 - 7.7 K/uL   Lymphocytes Relative 10 %   Lymphs Abs 0.8 0.7 - 4.0 K/uL   Monocytes Relative 6 %   Monocytes Absolute 0.5 0 - 1 K/uL   Eosinophils Relative 0 %   Eosinophils Absolute 0.0 0 - 0 K/uL   Basophils Relative 0 %   Basophils Absolute 0.0 0 - 0 K/uL   Immature Granulocytes 1 %   Abs Immature Granulocytes 0.09 (H) 0.00 - 0.07 K/uL    Comment: Performed at [facility], [place] [address] [place], [place] [address]  Comprehensive metabolic panel     Status: Abnormal   Collection Time: 07/19/20  4:55 AM  Result Value Ref Range  Sodium 134 (L) 135 - 145 mmol/L   Potassium 4.6 3.5 - 5.1 mmol/L   Chloride 102 98 - 111 mmol/L   CO2 23 22 - 32 mmol/L   Glucose, Bld 343 (H) 70 - 99 mg/dL    Comment: Glucose reference range applies only to samples taken after fasting for at least 8 hours.   BUN 31 (H) 8 - 23 mg/dL   Creatinine, Ser 0.86 0.61 - 1.24 mg/dL   Calcium 8.5 (L) 8.9 - 10.3 mg/dL   Total Protein 6.4 (L) 6.5 - 8.1 g/dL   Albumin 2.7 (L) 3.5 - 5.0 g/dL   AST 46 (H) 15 - 41 U/L   ALT 50 (H) 0 - 44 U/L   Alkaline Phosphatase 52 38 - 126 U/L   Total Bilirubin 1.0 0.3 - 1.2 mg/dL   GFR calc non Af Amer >60 >60 mL/min   GFR calc Af Amer >60 >60 mL/min   Anion gap 9 5 - 15    Comment: Performed at [facility], [place] [address], [place], [place] [address]  C-reactive protein     Status: Abnormal   Collection Time: 07/19/20  4:55 AM  Result Value Ref Range   CRP 4.3 (H) <1.0 mg/dL    Comment: Performed at [facility], [place] [address], [place], [place] [address]  D-dimer, quantitative (not at [facility])     Status: Abnormal   Collection Time: 07/19/20  4:55 AM  Result Value Ref Range   D-Dimer, Quant 3.19 (H) 0.00 - 0.50 ug/mL-FEU    Comment: (NOTE) At the  manufacturer cut-off of 0.50 ug/mL FEU, this assay has been documented to exclude PE with a sensitivity and negative predictive value of 97 to 99%.  At this time, this assay has not been approved by the FDA to exclude DVT/VTE. Results should be correlated with clinical presentation. Performed at [facility], [place] [address], [place], [place] [address]   Glucose, capillary     Status: Abnormal   Collection Time: 07/19/20 10:42 AM  Result Value Ref Range   Glucose-Capillary 288 (H) 70 - 99 mg/dL    Comment: Glucose reference range applies only to samples taken after fasting for at least 8 hours.  Glucose, capillary     Status: Abnormal   Collection Time: 07/19/20  4:20 PM  Result Value Ref Range   Glucose-Capillary 345 (H) 70 - 99 mg/dL    Comment: Glucose reference range applies only to samples taken after fasting for at least 8 hours.  Glucose, capillary     Status: Abnormal   Collection Time: 07/19/20  9:23 PM  Result Value Ref Range   Glucose-Capillary 257 (H) 70 - 99 mg/dL    Comment: Glucose reference range applies only to samples taken after fasting for at least 8 hours.  CBC with Differential/Platelet     Status: Abnormal   Collection Time: 07/20/20  5:40 AM  Result Value Ref Range   WBC 6.9 4.0 - 10.5 K/uL   RBC 4.89 4.22 - 5.81 MIL/uL   Hemoglobin 14.5 13.0 - 17.0 g/dL   HCT 43.5 39 - 52 %   MCV 89.0 80.0 - 100.0 fL   MCH 29.7 26.0 - 34.0 pg   MCHC 33.3 30.0 - 36.0 g/dL   RDW 13.9 11.5 - 15.5 %   Platelets 245 150 - 400 K/uL   nRBC 0.0 0.0 - 0.2 %   Neutrophils Relative % 88 %   Neutro Abs  6.1 1.7 - 7.7 K/uL   Lymphocytes Relative 5 %   Lymphs Abs 0.3 (L) 0.7 - 4.0 K/uL   Monocytes Relative 6 %   Monocytes Absolute 0.4 0 - 1 K/uL   Eosinophils Relative 0 %   Eosinophils Absolute 0.0 0 - 0 K/uL   Basophils Relative 0 %   Basophils Absolute 0.0 0 - 0 K/uL   Immature Granulocytes 1 %   Abs Immature Granulocytes 0.05 0.00 - 0.07 K/uL    Comment:  Performed at [facility], [place] [address] [place], [place] [address]  Comprehensive metabolic panel     Status: Abnormal   Collection Time: 07/20/20  5:40 AM  Result Value Ref Range   Sodium 139 135 - 145 mmol/L   Potassium 4.8 3.5 - 5.1 mmol/L   Chloride 103 98 - 111 mmol/L   CO2 26 22 - 32 mmol/L   Glucose, Bld 329 (H) 70 - 99 mg/dL    Comment: Glucose reference range applies only to samples taken after fasting for at least 8 hours.   BUN 33 (H) 8 - 23 mg/dL   Creatinine, Ser 0.81 0.61 - 1.24 mg/dL   Calcium 8.5 (L) 8.9 - 10.3 mg/dL   Total Protein 5.7 (L) 6.5 - 8.1 g/dL   Albumin 2.7 (L) 3.5 - 5.0 g/dL   AST 40 15 - 41 U/L   ALT 48 (H) 0 - 44 U/L   Alkaline Phosphatase 53 38 - 126 U/L   Total Bilirubin 1.6 (H) 0.3 - 1.2 mg/dL   GFR calc non Af Amer >60 >60 mL/min   GFR calc Af Amer >60 >60 mL/min   Anion gap 10 5 - 15    Comment: Performed at [facility], [place] [address], [place], [place] [address]  C-reactive protein     Status: Abnormal   Collection Time: 07/20/20  5:40 AM  Result Value Ref Range   CRP 2.0 (H) <1.0 mg/dL    Comment: Performed at [facility], [place] [address] [place], [place] [address]  D-dimer, quantitative (not at [facility])     Status: Abnormal   Collection Time: 07/20/20  5:40 AM  Result Value Ref Range   D-Dimer, Quant 5.82 (H) 0.00 - 0.50 ug/mL-FEU    Comment: (NOTE) At the manufacturer cut-off of 0.50 ug/mL FEU, this assay has been documented to exclude PE with a sensitivity and negative predictive value of 97 to 99%.  At this time, this assay has not been approved by the FDA to exclude DVT/VTE. Results should be correlated with clinical presentation. Performed at [facility], [place] [address], [place], [place] [address]   Glucose, capillary     Status: Abnormal   Collection Time: 07/20/20  9:00 AM  Result Value Ref Range   Glucose-Capillary 302 (H) 70 - 99 mg/dL    Comment:  Glucose reference range applies only to samples taken after fasting for at least 8 hours.   Comment 1 Call MD NNP PA CNM   Glucose, capillary     Status: Abnormal   Collection Time: 07/20/20 12:18 PM  Result Value Ref Range   Glucose-Capillary 409 (H) 70 - 99 mg/dL    Comment: Glucose reference range applies only to samples taken after fasting for at least 8 hours.  Glucose, capillary     Status: Abnormal   Collection Time: 07/20/20  6:38 PM  Result Value Ref Range   Glucose-Capillary 324 (H) 70 - 99  mg/dL    Comment: Glucose reference range applies only to samples taken after fasting for at least 8 hours.  Glucose, capillary     Status: Abnormal   Collection Time: 07/20/20  8:53 PM  Result Value Ref Range   Glucose-Capillary 358 (H) 70 - 99 mg/dL    Comment: Glucose reference range applies only to samples taken after fasting for at least 8 hours.  CBC with Differential/Platelet     Status: Abnormal   Collection Time: 07/21/20  5:37 AM  Result Value Ref Range   WBC 8.5 4.0 - 10.5 K/uL   RBC 5.05 4.22 - 5.81 MIL/uL   Hemoglobin 15.0 13.0 - 17.0 g/dL   HCT 44.6 39 - 52 %   MCV 88.3 80.0 - 100.0 fL   MCH 29.7 26.0 - 34.0 pg   MCHC 33.6 30.0 - 36.0 g/dL   RDW 13.8 11.5 - 15.5 %   Platelets 256 150 - 400 K/uL   nRBC 0.0 0.0 - 0.2 %   Neutrophils Relative % 91 %   Neutro Abs 7.8 (H) 1.7 - 7.7 K/uL   Lymphocytes Relative 4 %   Lymphs Abs 0.3 (L) 0.7 - 4.0 K/uL   Monocytes Relative 4 %   Monocytes Absolute 0.3 0 - 1 K/uL   Eosinophils Relative 0 %   Eosinophils Absolute 0.0 0 - 0 K/uL   Basophils Relative 0 %   Basophils Absolute 0.0 0 - 0 K/uL   Immature Granulocytes 1 %   Abs Immature Granulocytes 0.06 0.00 - 0.07 K/uL    Comment: Performed at [facility], [place] [address], [place], [place] [address]  Comprehensive metabolic panel     Status: Abnormal   Collection Time: 07/21/20  5:37 AM  Result Value Ref Range   Sodium 132 (L) 135 - 145 mmol/L    Comment:  DELTA CHECK NOTED   Potassium 4.2 3.5 - 5.1 mmol/L   Chloride 99 98 - 111 mmol/L   CO2 23 22 - 32 mmol/L   Glucose, Bld 287 (H) 70 - 99 mg/dL    Comment: Glucose reference range applies only to samples taken after fasting for at least 8 hours.   BUN 25 (H) 8 - 23 mg/dL   Creatinine, Ser 0.69 0.61 - 1.24 mg/dL   Calcium 8.1 (L) 8.9 - 10.3 mg/dL   Total Protein 5.7 (L) 6.5 - 8.1 g/dL   Albumin 2.6 (L) 3.5 - 5.0 g/dL   AST 41 15 - 41 U/L   ALT 61 (H) 0 - 44 U/L   Alkaline Phosphatase 54 38 - 126 U/L   Total Bilirubin 1.6 (H) 0.3 - 1.2 mg/dL   GFR calc non Af Amer >60 >60 mL/min   GFR calc Af Amer >60 >60 mL/min   Anion gap 10 5 - 15    Comment: Performed at [facility], [place] [address], [place], [place] [address]  C-reactive protein     Status: Abnormal   Collection Time: 07/21/20  5:37 AM  Result Value Ref Range   CRP 1.2 (H) <1.0 mg/dL    Comment: Performed at [facility], [place] [address] [place], [place] [address]  D-dimer, quantitative (not at [facility])     Status: Abnormal   Collection Time: 07/21/20  5:37 AM  Result Value Ref Range   D-Dimer, Quant 5.03 (H) 0.00 - 0.50 ug/mL-FEU    Comment: (NOTE) At the manufacturer cut-off of 0.50 ug/mL FEU, this assay has  been documented to exclude PE with a sensitivity and negative predictive value of 97 to 99%.  At this time, this assay has not been approved by the FDA to exclude DVT/VTE. Results should be correlated with clinical presentation. Performed at [facility], [place] [address], [place], [place] [address]   Glucose, capillary     Status: Abnormal   Collection Time: 07/21/20  7:58 AM  Result Value Ref Range   Glucose-Capillary 253 (H) 70 - 99 mg/dL    Comment: Glucose reference range applies only to samples taken after fasting for at least 8 hours.  Glucose, capillary     Status: Abnormal   Collection Time: 07/21/20 11:51 AM  Result Value Ref Range   Glucose-Capillary  381 (H) 70 - 99 mg/dL    Comment: Glucose reference range applies only to samples taken after fasting for at least 8 hours.  Glucose, capillary     Status: Abnormal   Collection Time: 07/21/20  5:26 PM  Result Value Ref Range   Glucose-Capillary 319 (H) 70 - 99 mg/dL    Comment: Glucose reference range applies only to samples taken after fasting for at least 8 hours.  Glucose, capillary     Status: Abnormal   Collection Time: 07/21/20  9:49 PM  Result Value Ref Range   Glucose-Capillary 309 (H) 70 - 99 mg/dL    Comment: Glucose reference range applies only to samples taken after fasting for at least 8 hours.  Glucose, capillary     Status: Abnormal   Collection Time: 07/22/20  7:53 AM  Result Value Ref Range   Glucose-Capillary 195 (H) 70 - 99 mg/dL    Comment: Glucose reference range applies only to samples taken after fasting for at least 8 hours.  Glucose, capillary     Status: Abnormal   Collection Time: 07/22/20 12:11 PM  Result Value Ref Range   Glucose-Capillary 318 (H) 70 - 99 mg/dL    Comment: Glucose reference range applies only to samples taken after fasting for at least 8 hours.  Glucose, capillary     Status: Abnormal   Collection Time: 07/22/20  4:06 PM  Result Value Ref Range   Glucose-Capillary 365 (H) 70 - 99 mg/dL    Comment: Glucose reference range applies only to samples taken after fasting for at least 8 hours.  Glucose, capillary     Status: Abnormal   Collection Time: 07/22/20  9:34 PM  Result Value Ref Range   Glucose-Capillary 379 (H) 70 - 99 mg/dL    Comment: Glucose reference range applies only to samples taken after fasting for at least 8 hours.  CBC with Differential/Platelet     Status: Abnormal   Collection Time: 07/23/20  4:40 AM  Result Value Ref Range   WBC 8.6 4.0 - 10.5 K/uL   RBC 4.78 4.22 - 5.81 MIL/uL   Hemoglobin 14.2 13.0 - 17.0 g/dL   HCT 42.1 39 - 52 %   MCV 88.1 80.0 - 100.0 fL   MCH 29.7 26.0 - 34.0 pg   MCHC 33.7 30.0 - 36.0 g/dL     RDW 13.4 11.5 - 15.5 %   Platelets 243 150 - 400 K/uL   nRBC 0.0 0.0 - 0.2 %   Neutrophils Relative % 83 %   Neutro Abs 7.2 1.7 - 7.7 K/uL   Lymphocytes Relative 9 %   Lymphs Abs 0.7 0.7 - 4.0 K/uL   Monocytes Relative 5 %   Monocytes Absolute 0.4 0 -  1 K/uL   Eosinophils Relative 1 %   Eosinophils Absolute 0.1 0 - 0 K/uL   Basophils Relative 0 %   Basophils Absolute 0.0 0 - 0 K/uL   Immature Granulocytes 2 %   Abs Immature Granulocytes 0.18 (H) 0.00 - 0.07 K/uL    Comment: Performed at [facility], [place] [address], [place], [place] [address]  Comprehensive metabolic panel     Status: Abnormal   Collection Time: 07/23/20  4:40 AM  Result Value Ref Range   Sodium 136 135 - 145 mmol/L   Potassium 4.2 3.5 - 5.1 mmol/L   Chloride 98 98 - 111 mmol/L   CO2 28 22 - 32 mmol/L   Glucose, Bld 247 (H) 70 - 99 mg/dL    Comment: Glucose reference range applies only to samples taken after fasting for at least 8 hours.   BUN 28 (H) 8 - 23 mg/dL   Creatinine, Ser 0.75 0.61 - 1.24 mg/dL   Calcium 8.4 (L) 8.9 - 10.3 mg/dL   Total Protein 5.5 (L) 6.5 - 8.1 g/dL   Albumin 2.6 (L) 3.5 - 5.0 g/dL   AST 24 15 - 41 U/L   ALT 54 (H) 0 - 44 U/L   Alkaline Phosphatase 53 38 - 126 U/L   Total Bilirubin 1.3 (H) 0.3 - 1.2 mg/dL   GFR calc non Af Amer >60 >60 mL/min   GFR calc Af Amer >60 >60 mL/min   Anion gap 10 5 - 15    Comment: Performed at [facility], [place] [address], [place], [place] [address]  Magnesium     Status: None   Collection Time: 07/23/20  4:40 AM  Result Value Ref Range   Magnesium 2.2 1.7 - 2.4 mg/dL    Comment: Performed at [facility], [place] [address], [place], [place] [address]  Phosphorus     Status: None   Collection Time: 07/23/20  4:40 AM  Result Value Ref Range   Phosphorus 2.9 2.5 - 4.6 mg/dL    Comment: Performed at [facility], [place] [address], [place], [place] [address]  D-dimer,  quantitative (not at [facility])     Status: Abnormal   Collection Time: 07/23/20  4:40 AM  Result Value Ref Range   D-Dimer, Quant 3.12 (H) 0.00 - 0.50 ug/mL-FEU    Comment: (NOTE) At the manufacturer cut-off of 0.50 ug/mL FEU, this assay has been documented to exclude PE with a sensitivity and negative predictive value of 97 to 99%.  At this time, this assay has not been approved by the FDA to exclude DVT/VTE. Results should be correlated with clinical presentation. Performed at [facility], [place] [address], [place], [place] [address]   Glucose, capillary     Status: Abnormal   Collection Time: 07/23/20  8:16 AM  Result Value Ref Range   Glucose-Capillary 186 (H) 70 - 99 mg/dL    Comment: Glucose reference range applies only to samples taken after fasting for at least 8 hours.  Glucose, capillary     Status: Abnormal   Collection Time: 07/23/20 11:57 AM  Result Value Ref Range   Glucose-Capillary 398 (H) 70 - 99 mg/dL    Comment: Glucose reference range applies only to samples taken after fasting for at least 8 hours.  Glucose, capillary     Status: Abnormal   Collection Time: 07/23/20  5:13 PM  Result Value Ref Range   Glucose-Capillary 314 (H) 70 -  99 mg/dL    Comment: Glucose reference range applies only to samples taken after fasting for at least 8 hours.  TSH     Status: None   Collection Time: 08/12/20  8:53 AM  Result Value Ref Range   TSH 3.18 0.40 - 4.50 mIU/L  Hepatitis C antibody     Status: None   Collection Time: 08/12/20  8:53 AM  Result Value Ref Range   Hepatitis C Ab NON-REACTIVE NON-REACTI   SIGNAL TO CUT-OFF 0.04 <1.00    Comment: . HCV antibody was non-reactive. There is no laboratory  evidence of HCV infection. . In most cases, no further action is required. However, if recent HCV exposure is suspected, a test for HCV RNA (test code [id number]) is suggested. . For additional information please refer  to http://education.[facility].com/faq/FAQ22v1 (This link is being provided for informational/ educational purposes only.) .   Urinalysis, Routine w reflex microscopic     Status: Abnormal   Collection Time: 08/12/20  8:53 AM  Result Value Ref Range   Color, Urine YELLOW YELLOW   APPearance CLEAR CLEAR   Specific Gravity, Urine 1.035 1.001 - 1.03   pH < OR = 5.0 5.0 - 8.0   Glucose, UA 3+ (A) NEGATIVE   Bilirubin Urine NEGATIVE NEGATIVE   Ketones, ur NEGATIVE NEGATIVE   Hgb urine dipstick NEGATIVE NEGATIVE   Protein, ur 1+ (A) NEGATIVE   Nitrite NEGATIVE NEGATIVE   Leukocytes,Ua NEGATIVE NEGATIVE   WBC, UA NONE SEEN 0 - 5 /HPF   RBC / HPF 0-2 0 - 2 /HPF   Squamous Epithelial / LPF NONE SEEN < OR = 5 /HPF   Bacteria, UA NONE SEEN NONE SEEN /HPF   Hyaline Cast NONE SEEN NONE SEEN /LPF  PSA     Status: None   Collection Time: 08/12/20  8:53 AM  Result Value Ref Range   PSA 0.15 < OR = 4.0 ng/mL    Comment: The total PSA value from this assay system is  standardized against the WHO standard. The test  result will be approximately 20% lower when compared  to the equimolar-standardized total PSA (Beckman  Coulter). Comparison of serial PSA results should be  interpreted with this fact in mind. . This test was performed using the Siemens  chemiluminescent method. Values obtained from  different assay methods cannot be used interchangeably. PSA levels, regardless of value, should not be interpreted as absolute evidence of the presence or absence of disease.   Lipid panel     Status: None   Collection Time: 08/12/20  8:53 AM  Result Value Ref Range   Cholesterol 159 <200 mg/dL   HDL 42 > OR = 40 mg/dL   Triglycerides 139 <150 mg/dL   LDL Cholesterol (Calc) 93 mg/dL (calc)    Comment: Reference range: <100 . Desirable range <100 mg/dL for primary prevention;   <70 mg/dL for patients with CHD or diabetic patients  with > or = 2 CHD risk factors. Marland Kitchen LDL-C is now  calculated using the Martin-Hopkins  calculation, which is a validated novel method providing  better accuracy than the Friedewald equation in the  estimation of LDL-C.  Cresenciano Genre et al. Annamaria Helling. 5053;976(73): 2061-2068  (http://education.[facility].com/faq/FAQ164)    Total CHOL/HDL Ratio 3.8 <5.0 (calc)   Non-HDL Cholesterol (Calc) 117 <130 mg/dL (calc)    Comment: For patients with diabetes plus 1 major ASCVD risk  factor, treating to a non-HDL-C goal of <100 mg/dL  (LDL-C of <70 mg/dL) is  considered a therapeutic  option.   COMPLETE METABOLIC PANEL WITH GFR     Status: None   Collection Time: 08/12/20  8:53 AM  Result Value Ref Range   Glucose, Bld 81 65 - 99 mg/dL    Comment: .            Fasting reference interval .    BUN 11 7 - 25 mg/dL   Creat 0.71 0.70 - 1.25 mg/dL    Comment: For patients >50 years of age, the reference limit for Creatinine is approximately 13% higher for people identified as African-American. .    GFR, Est Non African American 96 > OR = 60 mL/min/1.67m2   GFR, Est African American 112 > OR = 60 mL/min/1.30m2   BUN/Creatinine Ratio NOT APPLICABLE 6 - 22 (calc)   Sodium 139 135 - 146 mmol/L   Potassium 3.9 3.5 - 5.3 mmol/L   Chloride 104 98 - 110 mmol/L   CO2 24 20 - 32 mmol/L   Calcium 8.8 8.6 - 10.3 mg/dL   Total Protein 6.2 6.1 - 8.1 g/dL   Albumin 3.7 3.6 - 5.1 g/dL   Globulin 2.5 1.9 - 3.7 g/dL (calc)   AG Ratio 1.5 1.0 - 2.5 (calc)   Total Bilirubin 0.5 0.2 - 1.2 mg/dL   Alkaline phosphatase (APISO) 58 35 - 144 U/L   AST 14 10 - 35 U/L   ALT 15 9 - 46 U/L  CBC with Differential/Platelet     Status: Abnormal   Collection Time: 08/12/20  8:53 AM  Result Value Ref Range   WBC 5.1 3.8 - 10.8 Thousand/uL   RBC 4.30 4.20 - 5.80 Million/uL   Hemoglobin 13.3 13.2 - 17.1 g/dL   HCT 40.0 38 - 50 %   MCV 93.0 80.0 - 100.0 fL   MCH 30.9 27.0 - 33.0 pg   MCHC 33.3 32.0 - 36.0 g/dL   RDW 15.2 (H) 11.0 - 15.0 %   Platelets 105 (L) 140 - 400  Thousand/uL   MPV 9.0 7.5 - 12.5 fL   Neutro Abs 2,550 1,500 - 7,800 cells/uL   Lymphs Abs 1,484 850 - 3,900 cells/uL   Absolute Monocytes 785 200 - 950 cells/uL   Eosinophils Absolute 250 15 - 500 cells/uL   Basophils Absolute 31 0 - 200 cells/uL   Neutrophils Relative % 50 %   Total Lymphocyte 29.1 %   Monocytes Relative 15.4 %   Eosinophils Relative 4.9 %   Basophils Relative 0.6 %   Smear Review      Comment: No platelet clumps seen. Giant platelets noted. Review of peripheral smear confirms automated results.   Hemoglobin A1c     Status: Abnormal   Collection Time: 08/12/20  8:53 AM  Result Value Ref Range   Hgb A1c MFr Bld 8.3 (H) <5.7 % of total Hgb    Comment: For someone without known diabetes, a hemoglobin A1c value of 6.5% or greater indicates that they may have  diabetes and this should be confirmed with a follow-up  test. . For someone with known diabetes, a value <7% indicates  that their diabetes is well controlled and a value  greater than or equal to 7% indicates suboptimal  control. A1c targets should be individualized based on  duration of diabetes, age, comorbid conditions, and  other considerations. . Currently, no consensus exists regarding use of hemoglobin A1c for diagnosis of diabetes for children. .    Mean Plasma Glucose 192 (calc)  eAG (mmol/L) 10.6 (calc)    Radiology CT ANGIO CHEST PE W OR WO CONTRAST  Result Date: 07/17/2020 CLINICAL DATA:  COVID-19 viral infection. Increased oxygen demand and elevated D-dimer. Clinical suspicion for pulmonary embolism. EXAM: CT ANGIOGRAPHY CHEST WITH CONTRAST TECHNIQUE: Multidetector CT imaging of the chest was performed using the standard protocol during bolus administration of intravenous contrast. Multiplanar CT image reconstructions and MIPs were obtained to evaluate the vascular anatomy. CONTRAST:  27mL OMNIPAQUE IOHEXOL 350 MG/ML SOLN COMPARISON:  None. FINDINGS: Cardiovascular: Satisfactory  opacification of pulmonary arteries noted, and no pulmonary emboli identified. No evidence of thoracic aortic dissection or aneurysm. Mediastinum/Nodes: No masses or pathologically enlarged lymph nodes identified. Lungs/Pleura: Bilateral diffuse pulmonary airspace disease is seen, with greatest involvement of the lower lobes. No evidence of pleural effusion. Upper abdomen: No acute findings. Musculoskeletal: No suspicious bone lesions identified. Review of the MIP images confirms the above findings. IMPRESSION: No evidence of pulmonary embolism. Bilateral diffuse pulmonary airspace disease with lower lobe predominance, consistent with viral pneumonia. Electronically Signed   By: Marlaine Hind M.D.   On: 07/17/2020 11:36   DG Chest Port 1 View  Result Date: 07/16/2020 CLINICAL DATA:  Shortness of breath. Patient reports positive COVID test. EXAM: PORTABLE CHEST 1 VIEW COMPARISON:  None. FINDINGS: The heart size and mediastinal contours are within normal limits. Left worse than right interstitial opacities with basilar predominance. No pleural effusion or pneumothorax. The visualized skeletal structures are unremarkable. IMPRESSION: Left worse than right interstitial opacities with basilar predominance. Findings suspicious for atypical/viral infection. Electronically Signed   By: Davina Poke D.O.   On: 07/16/2020 14:27    Assessment/Plan  Varicose veins of bilateral lower extremities with pain Recommend:  The patient has had successful ablation of the previously incompetent saphenous venous system on the right but still has persistent symptoms of pain and swelling that are having a negative impact on daily life and daily activities.  Patient should undergo injection sclerotherapy to treat the residual varicosities. 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  The risks, benefits and alternative therapies were reviewed in detail with the patient.  All questions were answered.   The patient agrees to proceed with sclerotherapy at their convenience.  The patient will continue wearing the graduated compression stockings and using the over-the-counter pain medications to treat her symptoms.       Type 2 diabetes mellitus with other specified complication (HCC) blood glucose control important in reducing the progression of atherosclerotic disease. Also, involved in wound healing. On appropriate medications.     Leotis Pain, MD  08/15/2020 10:24 AM    This note was created with Dragon medical transcription system.  Any errors from dictation are purely unintentional

## 2020-08-15 NOTE — Patient Instructions (Signed)
Sclerotherapy  Sclerotherapy is a procedure that is done to improve the appearance of varicose veins and spider veins and to help relieve aching, swelling, cramping, and pain in the legs. Varicose veins are veins that have become enlarged, bulging, and twisted due to a damaged valve that causes blood to collect (pool) in the veins. Spider veins are small varicose veins. Sclerotherapy usually works best for smaller spider and varicose veins. This procedure involves injecting a chemical into the vein to close it off. You may need more than one treatment to close a vein all the way. Sclerotherapy is usually performed on the legs because that is where varicose and spider veins most often occur. Tell a health care provider about:  Any allergies you have.  All medicines you are taking, including vitamins, herbs, eye drops, creams, and over-the-counter medicines.  Any blood disorders you have.  Any surgeries you have had.  Any medical conditions you have.  Whether you are pregnant or may be pregnant. What are the risks? Generally, this is a safe procedure. However, problems may occur, including:  Infection.  Bleeding.  Allergic reactions to medicines or dyes.  Blood clots.  Nerve damage.  Bruising and scarring.  Darkened skin around the area. What happens before the procedure?  Do not use lotions or creams on your legs unless your health care provider approves.  Follow instructions from your health care provider about eating and drinking restrictions.  Do not use any products that contain nicotine or tobacco, such as cigarettes and e-cigarettes. If you need help quitting, ask your health care provider.  Ask your health care provider about: ? Changing or stopping your regular medicines. This is especially important if you are taking diabetes medicines or blood thinners. ? Taking medicines such as aspirin and ibuprofen. These medicines can thin your blood. Do not take these  medicines before your procedure if your health care provider instructs you not to.  You may have an ultrasound of the affected area to check for blood clots and to check blood flow.  In rare cases, you may have an X-ray procedure to check how blood flows through your veins (angiogram). For an angiogram, a dye is injected to outline your veins on X-rays. What happens during the procedure?  To lower your risk of infection: ? Your health care team will wash or sanitize their hands. ? Your skin will be washed with soap. ? Hair may be removed from the treatment area.  A small, thin needle will be used to inject a chemical (sclerosant) into your varicose vein. The sclerosant will irritate the lining of the vein and cause the vein to close below the injection site. You may feel some stinging, burning, or irritation.  The injection may be repeated for more than one varicose vein.  The injection area will be wrapped with elastic bandages. The procedure may vary among health care providers and hospitals. What happens after the procedure?  Your injection area will be wrapped with elastic bandages. If there is bleeding, the bandages may be changed.  Do not drive until your health care provider approves. You may need to wait 1-2 days before driving.  You will need to wear compression stockings for about a week, or as long as your health care provider recommends. Summary  Sclerotherapy is a procedure that is done to improve the appearance of varicose veins and spider veins and to help relieve aching, swelling, cramping, and pain in the legs.  A small, thin needle is   used to inject a chemical (sclerosant) into a spider vein or varicose vein to close it off.  Elastic bandages will be wrapped around the injection area after the procedure. This information is not intended to replace advice given to you by your health care provider. Make sure you discuss any questions you have with your health care  provider. Document Revised: 02/23/2019 Document Reviewed: 12/21/2016 Elsevier Patient Education  2020 Elsevier Inc.  

## 2020-08-15 NOTE — Assessment & Plan Note (Signed)
blood glucose control important in reducing the progression of atherosclerotic disease. Also, involved in wound healing. On appropriate medications.  

## 2020-08-15 NOTE — Assessment & Plan Note (Signed)
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  Patient should undergo injection sclerotherapy to treat the residual varicosities. 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  The risks, benefits and alternative therapies were reviewed in detail with the patient.  All questions were answered.  The patient agrees to proceed with sclerotherapy at their convenience.  The patient will continue wearing the graduated compression stockings and using the over-the-counter pain medications to treat her symptoms.

## 2020-08-19 ENCOUNTER — Encounter: Payer: Self-pay | Admitting: Family Medicine

## 2020-08-19 ENCOUNTER — Ambulatory Visit (INDEPENDENT_AMBULATORY_CARE_PROVIDER_SITE_OTHER): Payer: Medicare HMO | Admitting: Family Medicine

## 2020-08-19 ENCOUNTER — Other Ambulatory Visit: Payer: Self-pay

## 2020-08-19 VITALS — BP 109/56 | HR 108 | Temp 97.5°F | Resp 16 | Ht 70.0 in | Wt 219.6 lb

## 2020-08-19 DIAGNOSIS — I10 Essential (primary) hypertension: Secondary | ICD-10-CM | POA: Insufficient documentation

## 2020-08-19 DIAGNOSIS — Z23 Encounter for immunization: Secondary | ICD-10-CM | POA: Diagnosis not present

## 2020-08-19 DIAGNOSIS — N401 Enlarged prostate with lower urinary tract symptoms: Secondary | ICD-10-CM | POA: Diagnosis not present

## 2020-08-19 DIAGNOSIS — E1169 Type 2 diabetes mellitus with other specified complication: Secondary | ICD-10-CM | POA: Insufficient documentation

## 2020-08-19 DIAGNOSIS — E785 Hyperlipidemia, unspecified: Secondary | ICD-10-CM

## 2020-08-19 DIAGNOSIS — Z0000 Encounter for general adult medical examination without abnormal findings: Secondary | ICD-10-CM

## 2020-08-19 DIAGNOSIS — E669 Obesity, unspecified: Secondary | ICD-10-CM

## 2020-08-19 DIAGNOSIS — K644 Residual hemorrhoidal skin tags: Secondary | ICD-10-CM

## 2020-08-19 DIAGNOSIS — R3914 Feeling of incomplete bladder emptying: Secondary | ICD-10-CM

## 2020-08-19 DIAGNOSIS — Z794 Long term (current) use of insulin: Secondary | ICD-10-CM

## 2020-08-19 NOTE — Progress Notes (Signed)
Subjective:    Patient ID: Jonathan Shelton, male    DOB: 09/18/1951, 69 y.o.   MRN: [id number]  Jonathan Shelton is a 69 y.o. male presenting on 08/19/2020 for Annual Exam   HPI   Here for Annual Physical and Lab Review.   CHRONIC DM, Type 2: Last A1c 8.3, attributed to steroids and recent COVID19 CBGs: [place] 68-110s, rare low sugar, high sugar approx 200 or less Meds:Novolin-N14-25units nightly, Ozempic 1mg  weekly,Jardiance 25mg  daily, Metformin 1000mg  BID Reports good compliance. Tolerating well w/o side-effects Currently on ARB Weight down 15-20 lbs in past 4-6 weeks DM Eye exam done by Dr Ellin Mayhew - Due for DM Foot exam Denies hypoglycemia, polyuria, visual changes, numbness or tingling.  CHRONIC HTN: Reportsno new concerns. Current Meds -Valsartan 80mg  daily Reports good compliance, took meds today. Tolerating well, w/o complaints. Denies CP, dyspnea, HA, edema, dizziness / lightheadedness  Tubular Adenoma / Colon Polyps History with last seen GI, they performed colonoscopy 01/2020 and identified hemorrhoids and diverticulosis and based on colonoscopy report, identified 3 small polyps and 1 large polyp 8mm or 4cm, they did partial excision, result showed Tubular adenoma without high grade dysplasia on pathology, he was referred to [facility] Dr Rush Landmark for complicated polyp excision in 03/2020 Now return within 6 month for repeat. Colonoscopy  Post-COVID Last telemedicine visit 08/05/20 Still admits fatigue and some dyspnea.  Hemorrhoid, External He identified growth on external rectum. Next colonoscopy will address Denies bleeding or pain  BPH LUTS / Incomplete bladder emptying dysuria Reports known chronic issue with BPH and incomplete bladder emptying. Previously discussed this issue in past, he had been doing fairly well on Tamsulosin 0.4mg  daily and Finasteride 5mg  daily, see prior reports, previous PCP had him on 0.8 of flomax at one  point but he does better on this regimen. - He also admits a "burning" sensation at times. - He still describes same BPH symptoms some reduced urinary stream, frequency, nocturia, incomplete emptying - He does take SGLT2 med, but has not endorsed UTI or Yeast infection - He said in Hospital was given Proscar name brand instead of FInasteride, seemed to work better - He is interested to see Urologist now  History of Skin Cancer / Seborrheic Keratosis Followed by Dr [facility]  Additional complaints  Right knee pain - he plans to follow-up with Orthopedic.  Emotional Lability- frequent crying spells episodic, seems to be due to both happy and sad emotions, watching something or reading, can trigger his emotions more suddenly. Denies depression.  Health Maintenance: Due for initial pneumonia vaccine at age >36 - will receive Prevnar-13 today, then pneumovax-23 in 1 year  Due for Flu Shot, will receive today    Depression screen [facility] 2/9 03/18/2020 02/11/2020 10/08/2019  Decreased Interest 0 0 0  Down, Depressed, Hopeless 0 0 0  PHQ - 2 Score 0 0 0  Altered sleeping - - -  Tired, decreased energy - - -  Change in appetite - - -  Feeling bad or failure about yourself  - - -  Trouble concentrating - - -  Moving slowly or fidgety/restless - - -  Suicidal thoughts - - -  PHQ-9 Score - - -  Difficult doing work/chores - - -   No flowsheet data found.    Past Medical History:  Diagnosis Date  . Allergy   . BPH (benign prostatic hyperplasia)   . Colon polyps   . Diabetes ([place])   . Diverticulosis   .  Gallstones   . GERD (gastroesophageal reflux disease)   . HOH (hard of hearing)   . Kidney stones   . Renal disorder   . Wears glasses    Past Surgical History:  Procedure Laterality Date  . CARPAL TUNNEL RELEASE    . CHOLECYSTECTOMY    . COLONOSCOPY WITH PROPOFOL N/A 02/06/2020   Procedure: COLONOSCOPY WITH PROPOFOL;  Surgeon: Lin Landsman, MD;  Location[facility]  ENDOSCOPY;  Service: Gastroenterology;  Laterality: N/A;  . COLONOSCOPY WITH PROPOFOL N/A 03/31/2020   Procedure: COLONOSCOPY WITH PROPOFOL;  Surgeon: Rush Landmark Telford Nab., MD;  Location: [place];  Service: Gastroenterology;  Laterality: N/A;  . ENDOSCOPIC MUCOSAL RESECTION N/A 03/31/2020   Procedure: ENDOSCOPIC MUCOSAL RESECTION;  Surgeon: Rush Landmark Telford Nab., MD;  Location: [place];  Service: Gastroenterology;  Laterality: N/A;  . HEMOSTASIS CLIP PLACEMENT  03/31/2020   Procedure: HEMOSTASIS CLIP PLACEMENT;  Surgeon: Irving Copas., MD;  Location: [place];  Service: Gastroenterology;;  . HERNIA REPAIR    . POLYPECTOMY  03/31/2020   Procedure: POLYPECTOMY;  Surgeon: Mansouraty, Telford Nab., MD;  Location: [place];  Service: Gastroenterology;;  . SMALL INTESTINE SURGERY    . SUBMUCOSAL LIFTING INJECTION  03/31/2020   Procedure: SUBMUCOSAL LIFTING INJECTION;  Surgeon: Rush Landmark Telford Nab., MD;  Location: [facility] ENDOSCOPY;  Service: Gastroenterology;;   Social History   Socioeconomic History  . Marital status: Married    Spouse name: Not on file  . Number of children: Not on file  . Years of education: Graduate Degree  . Highest education level: Master's degree (e.g., MA, MS, MEng, MEd, MSW, MBA)  Occupational History  . Not on file  Tobacco Use  . Smoking status: Former Smoker    Types: Cigarettes    Quit date: 1970    Years since quitting: 51.7  . Smokeless tobacco: Former Systems developer    Types: Snuff  Vaping Use  . Vaping Use: Never used  Substance and Sexual Activity  . Alcohol use: Not Currently  . Drug use: Not Currently    Comment: past  . Sexual activity: Not on file  Other Topics Concern  . Not on file  Social History Narrative  . Not on file   Social Determinants of Health   Financial Resource Strain:   . Difficulty of Paying Living Expenses: Not on file  Food Insecurity:   . Worried About Charity fundraiser in the Last Year: Not on file   . Ran Out of Food in the Last Year: Not on file  Transportation Needs:   . Lack of Transportation (Medical): Not on file  . Lack of Transportation (Non-Medical): Not on file  Physical Activity:   . Days of Exercise per Week: Not on file  . Minutes of Exercise per Session: Not on file  Stress:   . Feeling of Stress : Not on file  Social Connections:   . Frequency of Communication with Friends and Family: Not on file  . Frequency of Social Gatherings with Friends and Family: Not on file  . Attends Religious Services: Not on file  . Active Member of Clubs or Organizations: Not on file  . Attends Archivist Meetings: Not on file  . Marital Status: Not on file  Intimate Partner Violence:   . Fear of Current or Ex-Partner: Not on file  . Emotionally Abused: Not on file  . Physically Abused: Not on file  . Sexually Abused: Not on file   Family History  Problem Relation  Age of Onset  . Heart disease Mother 54  . Heart disease Father   . Stroke Father 66  . Diabetes Father   . Heart attack Father   . Liver disease Neg Hx   . Colon cancer Neg Hx   . Esophageal cancer Neg Hx   . Pancreatic cancer Neg Hx   . Stomach cancer Neg Hx   . Inflammatory bowel disease Neg Hx   . Rectal cancer Neg Hx    Current Outpatient Medications on File Prior to Visit  Medication Sig  . ascorbic acid (VITAMIN C) 500 MG tablet Take 500 mg by mouth daily.  Marland Kitchen aspirin 81 MG chewable tablet Chew 81 mg by mouth daily.   Marland Kitchen atorvastatin (LIPITOR) 10 MG tablet Take 1 tablet (10 mg total) by mouth daily at 6 PM.  . b complex vitamins tablet Take 1 tablet by mouth daily.  Marland Kitchen CRANBERRY FRUIT PO Take 1 tablet by mouth daily.   Marland Kitchen glucose blood test strip Check blood sugar up to 4 times daily  . insulin NPH Human (NOVOLIN N) 100 UNIT/ML injection Inject 0.4 mLs (40 Units total) into the skin at bedtime. May use reduced dose as advised. (Patient taking differently: Inject 30-45 Units into the skin at bedtime  as needed (high blood sugar). May use reduced dose as advised.)  . JARDIANCE 25 MG TABS tablet TAKE 1 TABLET BY MOUTH EVERY DAY  . levalbuterol (XOPENEX HFA) 45 MCG/ACT inhaler Inhale 1-2 puffs into the lungs every 6 (six) hours as needed for wheezing.  . metFORMIN (GLUCOPHAGE) 1000 MG tablet Take 1 tablet (1,000 mg total) by mouth 2 (two) times daily with a meal.  . OZEMPIC, 1 MG/DOSE, 2 MG/1.5ML SOPN Inject 1 mg into the skin once a week.  . tamsulosin (FLOMAX) 0.4 MG CAPS capsule Take 1 capsule (0.4 mg total) by mouth daily after supper.  . valsartan (DIOVAN) 80 MG tablet Take 1 tablet (80 mg total) by mouth daily.  . vitamin E 400 UNIT capsule Take 400 Units by mouth daily.  . finasteride (PROSCAR) 5 MG tablet TAKE 1 TABLET BY MOUTH EVERY DAY (Patient not taking: Reported on 08/19/2020)  . [DISCONTINUED] albuterol (VENTOLIN HFA) 108 (90 Base) MCG/ACT inhaler Inhale 1-2 puffs into the lungs every 4 (four) hours as needed for wheezing or shortness of breath.   No current facility-administered medications on file prior to visit.    Review of Systems  Constitutional: Negative for activity change, appetite change, chills, diaphoresis, fatigue and fever.  HENT: Negative for congestion and hearing loss.   Eyes: Negative for visual disturbance.  Respiratory: Negative for apnea, cough, chest tightness, shortness of breath and wheezing.   Cardiovascular: Negative for chest pain, palpitations and leg swelling.  Gastrointestinal: Negative for abdominal pain, anal bleeding, blood in stool, constipation, diarrhea, nausea and vomiting.  Endocrine: Negative for cold intolerance.  Genitourinary: Positive for difficulty urinating. Negative for dysuria, frequency and hematuria.  Musculoskeletal: Negative for arthralgias, back pain and neck pain.  Skin: Negative for rash.  Allergic/Immunologic: Negative for environmental allergies.  Neurological: Negative for dizziness, weakness, light-headedness, numbness  and headaches.  Hematological: Negative for adenopathy.  Psychiatric/Behavioral: Negative for behavioral problems, dysphoric mood and sleep disturbance. The patient is not nervous/anxious.        Labile emotions, crying spells but not depressed   Per HPI unless specifically indicated above     Objective:    BP (!) 109/56   Pulse (!) 108   Temp (!)  97.5 F (36.4 C) (Temporal)   Resp 16   Ht 5\' 10"  (1.778 m)   Wt 219 lb 9.6 oz (99.6 kg)   SpO2 94%   BMI 31.51 kg/m   Wt Readings from Last 3 Encounters:  08/19/20 219 lb 9.6 oz (99.6 kg)  08/15/20 222 lb (100.7 kg)  07/20/20 236 lb 15.9 oz (107.5 kg)    Physical Exam Vitals and nursing note reviewed.  Constitutional:      General: He is not in acute distress.    Appearance: He is well-developed. He is not diaphoretic.     Comments: Well-appearing, comfortable, cooperative  HENT:     Head: Normocephalic and atraumatic.  Eyes:     General:        Right eye: No discharge.        Left eye: No discharge.     Conjunctiva/sclera: Conjunctivae normal.  Neck:     Thyroid: No thyromegaly.  Cardiovascular:     Rate and Rhythm: Normal rate and regular rhythm.     Heart sounds: Normal heart sounds. No murmur heard.   Pulmonary:     Effort: Pulmonary effort is normal. No respiratory distress.     Breath sounds: Normal breath sounds. No wheezing or rales.  Musculoskeletal:        General: Normal range of motion.     Cervical back: Normal range of motion and neck supple.     Comments: Varicose veins  Lymphadenopathy:     Cervical: No cervical adenopathy.  Skin:    General: Skin is warm and dry.     Findings: No erythema or rash.  Neurological:     Mental Status: He is alert and oriented to person, place, and time.  Psychiatric:        Behavior: Behavior normal.     Comments: Well groomed, good eye contact, normal speech and thoughts       Diabetic Foot Exam - Simple   Simple Foot Form Diabetic Foot exam was performed  with the following findings: Yes 08/19/2020  2:01 PM  Visual Inspection No deformities, no ulcerations, no other skin breakdown bilaterally: Yes Sensation Testing Intact to touch and monofilament testing bilaterally: Yes Pulse Check Posterior Tibialis and Dorsalis pulse intact bilaterally: Yes Comments     Results for orders placed or performed in visit on 08/11/20  TSH  Result Value Ref Range   TSH 3.18 0.40 - 4.50 mIU/L  Hepatitis C antibody  Result Value Ref Range   Hepatitis C Ab NON-REACTIVE NON-REACTI   SIGNAL TO CUT-OFF 0.04 <1.00  Urinalysis, Routine w reflex microscopic  Result Value Ref Range   Color, Urine YELLOW YELLOW   APPearance CLEAR CLEAR   Specific Gravity, Urine 1.035 1.001 - 1.03   pH < OR = 5.0 5.0 - 8.0   Glucose, UA 3+ (A) NEGATIVE   Bilirubin Urine NEGATIVE NEGATIVE   Ketones, ur NEGATIVE NEGATIVE   Hgb urine dipstick NEGATIVE NEGATIVE   Protein, ur 1+ (A) NEGATIVE   Nitrite NEGATIVE NEGATIVE   Leukocytes,Ua NEGATIVE NEGATIVE   WBC, UA NONE SEEN 0 - 5 /HPF   RBC / HPF 0-2 0 - 2 /HPF   Squamous Epithelial / LPF NONE SEEN < OR = 5 /HPF   Bacteria, UA NONE SEEN NONE SEEN /HPF   Hyaline Cast NONE SEEN NONE SEEN /LPF  PSA  Result Value Ref Range   PSA 0.15 < OR = 4.0 ng/mL  Lipid panel  Result Value Ref Range  Cholesterol 159 <200 mg/dL   HDL 42 > OR = 40 mg/dL   Triglycerides 139 <150 mg/dL   LDL Cholesterol (Calc) 93 mg/dL (calc)   Total CHOL/HDL Ratio 3.8 <5.0 (calc)   Non-HDL Cholesterol (Calc) 117 <130 mg/dL (calc)  COMPLETE METABOLIC PANEL WITH GFR  Result Value Ref Range   Glucose, Bld 81 65 - 99 mg/dL   BUN 11 7 - 25 mg/dL   Creat 0.71 0.70 - 1.25 mg/dL   GFR, Est Non African American 96 > OR = 60 mL/min/1.53m2   GFR, Est African American 112 > OR = 60 mL/min/1.50m2   BUN/Creatinine Ratio NOT APPLICABLE 6 - 22 (calc)   Sodium 139 135 - 146 mmol/L   Potassium 3.9 3.5 - 5.3 mmol/L   Chloride 104 98 - 110 mmol/L   CO2 24 20 - 32  mmol/L   Calcium 8.8 8.6 - 10.3 mg/dL   Total Protein 6.2 6.1 - 8.1 g/dL   Albumin 3.7 3.6 - 5.1 g/dL   Globulin 2.5 1.9 - 3.7 g/dL (calc)   AG Ratio 1.5 1.0 - 2.5 (calc)   Total Bilirubin 0.5 0.2 - 1.2 mg/dL   Alkaline phosphatase (APISO) 58 35 - 144 U/L   AST 14 10 - 35 U/L   ALT 15 9 - 46 U/L  CBC with Differential/Platelet  Result Value Ref Range   WBC 5.1 3.8 - 10.8 Thousand/uL   RBC 4.30 4.20 - 5.80 Million/uL   Hemoglobin 13.3 13.2 - 17.1 g/dL   HCT 40.0 38 - 50 %   MCV 93.0 80.0 - 100.0 fL   MCH 30.9 27.0 - 33.0 pg   MCHC 33.3 32.0 - 36.0 g/dL   RDW 15.2 (H) 11.0 - 15.0 %   Platelets 105 (L) 140 - 400 Thousand/uL   MPV 9.0 7.5 - 12.5 fL   Neutro Abs 2,550 1,500 - 7,800 cells/uL   Lymphs Abs 1,484 850 - 3,900 cells/uL   Absolute Monocytes 785 200 - 950 cells/uL   Eosinophils Absolute 250 15 - 500 cells/uL   Basophils Absolute 31 0 - 200 cells/uL   Neutrophils Relative % 50 %   Total Lymphocyte 29.1 %   Monocytes Relative 15.4 %   Eosinophils Relative 4.9 %   Basophils Relative 0.6 %   Smear Review    Hemoglobin A1c  Result Value Ref Range   Hgb A1c MFr Bld 8.3 (H) <5.7 % of total Hgb   Mean Plasma Glucose 192 (calc)   eAG (mmol/L) 10.6 (calc)      Assessment & Plan:   Problem List Items Addressed This Visit    Type 2 diabetes mellitus with other specified complication (HCC)    Elevated O5D to 8.3 uncertain cause, likely steroids and illness w/ COVID Still on higher dose GLP1 Complications - some early peripheral neuropathy, obesity - increases risk of future cardiovascular complications   Plan:  1. Continue Ozempic 1mg  weekly injecation 2. Continue to Reduce NPH insulin based on fasting CBG goal < 150, now < 20 units often advise can eventually lower further and may discontinue if possible - Continue Metformin 1000mg  BID - Continue Jardiance 25mg  daily - may reconsider SGLT2 med if has urinary symptoms that persist, for now will refer to Urology 2.  Encourage improved lifestyle - low carb, low sugar diet, reduce portion size, continue improving regular exercise 3. Check CBG, bring log to next visit for review 4. Continue ASA, ARB, Statin      Obesity (BMI  30.0-34.9)    Weight down to 219 Sick with COVID recently w weight loss and also on GLP1      Hyperlipidemia associated with type 2 diabetes mellitus ([place])    Controlled cholesterol on statin and lifestyle Last lipid panel 07/2020 The 10-year ASCVD risk score Mikey Bussing DC Jr., et al., 2013) is: 24.2%   Plan: 1. Continue current meds - Atorvastatin 10mg  2. Continue ASA 81mg  for primary ASCVD risk reduction 3. Encourage improved lifestyle - low carb/cholesterol, reduce portion size, continue improving regular exercise      Essential hypertension    Well-controlled HTN  No known complications    Plan:  1.  Continue current BP regimen Valsartan 80mg  daily 2. Encourage improved lifestyle - low sodium diet, regular exercise 3. Continue monitor BP outside office, bring readings to next visit, if persistently >140/90 or new symptoms notify office sooner      Benign prostatic hyperplasia with incomplete bladder emptying    Gradual worsening chronic BPH with LUTS with incomplete emptying - On Flomax 0.4mg , Finasteride 5mg , failed flomax 0.8 - Last PSA 0.15 (07/2020) - No known personal/family history of prostate CA  Plan: 1. Continue current regimen Tamsulosin 0.4mg  daily, Finasteride 5mg  daily - Discussed that proscar in hospital is same as finasteride, but maybe brand name worked better?  Referral to Urology for consultation May recommend DC Jardiance if affecting Urine symptoms      Relevant Orders   Ambulatory referral to Urology    Other Visit Diagnoses    Annual physical exam    -  Primary   Morbid obesity ([place])       Need for vaccination with 13-polyvalent pneumococcal conjugate vaccine       Relevant Orders   Pneumococcal conjugate vaccine 13-valent IM (Completed)    Needs flu shot       Relevant Orders   Flu Vaccine QUAD High Dose(Fluad) (Completed)   External hemorrhoid          Updated Health Maintenance information - Flu shot today, high dose - Prevnar 13 1st dose today, next pneumovax 23 in 1 year - Next colonoscopy repeat per GI Reviewed recent lab results with patient Encouraged improvement to lifestyle with diet and exercise - Goal of weight loss   No orders of the defined types were placed in this encounter.     Follow up plan: Return in about 3 months (around 11/19/2020) for 3 month DM A1c.  Nobie Putnam, [facility] 08/19/2020, 1:38 PM

## 2020-08-19 NOTE — Patient Instructions (Addendum)
Thank you for coming to the office today.  High dose Flu shot today  1st dose Pneumonia vaccine today - Prevnar 13, next dose due in 1 year. Pneumovax 23  Follow-up with Orthopedic for your Knee  Try taking 2 of the tamsulosin at once caution with dizziness as side effect  Continue finasteride, ask Urologist about name brand proscar or other treatment.  [facility] -[address]st floor [place],  [place]  [address] Phone: [phone]  ----------  Recent Labs    02/11/20 1416 07/17/20 0539 08/12/20 0853  HGBA1C 6.8* 7.6* 8.3*     Please schedule a Follow-up Appointment to: Return in about 3 months (around 11/19/2020) for 3 month DM A1c.  If you have any other questions or concerns, please feel free to call the office or send a message through [facility]. You may also schedule an earlier appointment if necessary.  Additionally, you may be receiving a survey about your experience at our office within a few days to 1 week by e-mail or mail. We value your feedback.  Nobie Putnam, DO [place]

## 2020-08-20 ENCOUNTER — Encounter: Payer: Self-pay | Admitting: Family Medicine

## 2020-08-20 DIAGNOSIS — M170 Bilateral primary osteoarthritis of knee: Secondary | ICD-10-CM | POA: Insufficient documentation

## 2020-08-20 NOTE — Assessment & Plan Note (Signed)
Weight down to 219 Sick with COVID recently w weight loss and also on GLP1

## 2020-08-20 NOTE — Assessment & Plan Note (Signed)
Well-controlled HTN  No known complications    Plan:  1.  Continue current BP regimen Valsartan 80mg  daily 2. Encourage improved lifestyle - low sodium diet, regular exercise 3. Continue monitor BP outside office, bring readings to next visit, if persistently >140/90 or new symptoms notify office sooner

## 2020-08-20 NOTE — Assessment & Plan Note (Signed)
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 Continue current regimen Tamsulosin 0.4mg  daily, Finasteride 5mg  daily - Discussed that proscar in hospital is same as finasteride, but maybe brand name worked better?  Referral to Urology for consultation May recommend DC Jardiance if affecting Urine symptoms

## 2020-08-20 NOTE — Assessment & Plan Note (Signed)
Elevated D6Q to 8.3 uncertain cause, likely steroids and illness w/ COVID Still on higher dose GLP1 Complications - some early peripheral neuropathy, obesity - increases risk of future cardiovascular complications   Plan:  1. Continue Ozempic 1mg  weekly injecation 2. Continue to Reduce NPH insulin based on fasting CBG goal < 150, now < 20 units often advise can eventually lower further and may discontinue if possible - Continue Metformin 1000mg  BID - Continue Jardiance 25mg  daily - may reconsider SGLT2 med if has urinary symptoms that persist, for now will refer to Urology 2. Encourage improved lifestyle - low carb, low sugar diet, reduce portion size, continue improving regular exercise 3. Check CBG, bring log to next visit for review 4. Continue ASA, ARB, Statin

## 2020-08-20 NOTE — Assessment & Plan Note (Signed)
Controlled cholesterol on statin and lifestyle Last lipid panel 07/2020 The 10-year ASCVD risk score Jonathan Bussing DC Jr., Jonathan al., Jonathan Shelton) is: 24.2%   Plan: 1. Continue current meds - Atorvastatin 10mg  2. Continue ASA 81mg  for primary ASCVD risk reduction 3. Encourage improved lifestyle - low carb/cholesterol, reduce portion size, continue improving regular exercise

## 2020-08-23 ENCOUNTER — Other Ambulatory Visit: Payer: Self-pay | Admitting: Family Medicine

## 2020-08-23 DIAGNOSIS — Z794 Long term (current) use of insulin: Secondary | ICD-10-CM

## 2020-08-23 DIAGNOSIS — E1169 Type 2 diabetes mellitus with other specified complication: Secondary | ICD-10-CM

## 2020-08-23 NOTE — Telephone Encounter (Signed)
Requested Prescriptions  Pending Prescriptions Disp Refills  . metFORMIN (GLUCOPHAGE) 1000 MG tablet [Pharmacy Med Name: METFORMIN HCL 1,000 MG TABLET] 180 tablet 1    Sig: TAKE 1 TABLET (1,000 MG TOTAL) BY MOUTH 2 (TWO) TIMES DAILY WITH A MEAL.     Endocrinology:  Diabetes - Biguanides Failed - 08/23/2020  8:56 AM      Failed - HBA1C is between 0 and 7.9 and within 180 days    Hgb A1c MFr Bld  Date Value Ref Range Status  08/12/2020 8.3 (H) <5.7 % of total Hgb Final    Comment:    For someone without known diabetes, a hemoglobin A1c value of 6.5% or greater indicates that they may have  diabetes and this should be confirmed with a follow-up  test. . For someone with known diabetes, a value <7% indicates  that their diabetes is well controlled and a value  greater than or equal to 7% indicates suboptimal  control. A1c targets should be individualized based on  duration of diabetes, age, comorbid conditions, and  other considerations. . Currently, no consensus exists regarding use of hemoglobin A1c for diagnosis of diabetes for children. .          Passed - Cr in normal range and within 360 days    Creat  Date Value Ref Range Status  08/12/2020 0.71 0.70 - 1.25 mg/dL Final    Comment:    For patients >49 years of age, the reference limit for Creatinine is approximately 13% higher for people identified as African-American. .          Passed - eGFR in normal range and within 360 days    GFR, Est African American  Date Value Ref Range Status  08/12/2020 112 > OR = 60 mL/min/1.73m2 Final   GFR, Est Non African American  Date Value Ref Range Status  08/12/2020 96 > OR = 60 mL/min/1.73m2 Final         Passed - Valid encounter within last 6 months    Recent Outpatient Visits          4 days ago Annual physical exam   [facility] Karamalegos, Alexander J, DO   2 weeks ago COVID-19   [facility] Karamalegos, Alexander J, DO   1 month ago  COVID-19   [facility] Malfi, Nicole M, FNP   6 months ago Type 2 diabetes mellitus with other specified complication, with long-term current use of insulin (HCC)   [facility] Karamalegos, Alexander J, DO   7 months ago Rectal bleeding   [facility] Karamalegos, Alexander J, DO      Future Appointments            In 6 days Stoioff, Scott C, MD [facility]   In 3 months Karamalegos, Alexander J, DO [facility]   In 7 months Kowalski, David C, MD [facility]             

## 2020-08-29 ENCOUNTER — Ambulatory Visit (INDEPENDENT_AMBULATORY_CARE_PROVIDER_SITE_OTHER): Payer: Medicare HMO | Admitting: Urology

## 2020-08-29 ENCOUNTER — Encounter: Payer: Self-pay | Admitting: Urology

## 2020-08-29 ENCOUNTER — Other Ambulatory Visit: Payer: Self-pay

## 2020-08-29 ENCOUNTER — Telehealth: Payer: Self-pay

## 2020-08-29 VITALS — BP 142/81 | HR 98 | Ht 70.0 in | Wt 218.0 lb

## 2020-08-29 DIAGNOSIS — N401 Enlarged prostate with lower urinary tract symptoms: Secondary | ICD-10-CM | POA: Diagnosis not present

## 2020-08-29 DIAGNOSIS — R3914 Feeling of incomplete bladder emptying: Secondary | ICD-10-CM

## 2020-08-29 LAB — BLADDER SCAN AMB NON-IMAGING: Scan Result: 37

## 2020-08-29 MED ORDER — TAMSULOSIN HCL 0.4 MG PO CAPS: 0.8000 mg | ORAL_CAPSULE | Freq: Every day | ORAL | 0 refills | Status: DC

## 2020-08-29 NOTE — Telephone Encounter (Signed)
Copied from [place] [id number]. Topic: General - Inquiry >> Aug 29, 2020 11:41 AM Gillis Ends D wrote: Reason for CRM: Patient stated that he needed Dr. Raliegh Ip to fax a letter to [facility] to come and pick up the oxygen. It has been sitting in his house for over 6 weeks and they refuse to pick it up until they receive a letter stating that he no longer needs the oxygen from his doctor. They are charging him for this oxygen. The fax number is [phone] (attention Intake). If you have any questions he can be reached at [phone]. Please advise

## 2020-08-29 NOTE — Progress Notes (Signed)
08/29/2020 12:50 PM   Jonathan Shelton [birth year] [id number]  Referring provider: [name], DO [address] [place],  [place] [address]  Chief Complaint  Patient presents with  . Benign Prostatic Hypertrophy    HPI: Jonathan Shelton is a 69 y.o. male seen at the request of Dr. Parks Ranger for evaluation of BPH with lower urinary tract symptoms.   Relates to voiding difficulties for many years  Complains of sensation incomplete bladder emptying, frequency, intermittent urinary stream, urgency, weak urinary stream, straining to urinate and nocturia x4-5  IPSS completed today 35/35 with bother rated 6/6  Also complains of chronic dysuria  Denies gross hematuria  Denies flank, abdominal or pelvic pain  Denies previous urologic evaluation  On tamsulosin/finasteride with no significant improvement in symptoms  Has increased tamsulosin to 0.4 mg twice daily which he did not feel was beneficial     PMH: Past Medical History:  Diagnosis Date  . Allergy   . BPH (benign prostatic hyperplasia)   . Colon polyps   . Diabetes ([place])   . Diverticulosis   . Gallstones   . GERD (gastroesophageal reflux disease)   . HOH (hard of hearing)   . Kidney stones   . Renal disorder   . Wears glasses     Surgical History: Past Surgical History:  Procedure Laterality Date  . CARPAL TUNNEL RELEASE    . CHOLECYSTECTOMY    . COLONOSCOPY WITH PROPOFOL N/A 02/06/2020   Procedure: COLONOSCOPY WITH PROPOFOL;  Surgeon: Lin Landsman, MD;  Location: [facility] ENDOSCOPY;  Service: Gastroenterology;  Laterality: N/A;  . COLONOSCOPY WITH PROPOFOL N/A 03/31/2020   Procedure: COLONOSCOPY WITH PROPOFOL;  Surgeon: Rush Landmark Telford Nab., MD;  Location: [place];  Service: Gastroenterology;  Laterality: N/A;  . ENDOSCOPIC MUCOSAL RESECTION N/A 03/31/2020   Procedure: ENDOSCOPIC MUCOSAL RESECTION;  Surgeon: Rush Landmark Telford Nab., MD;  Location: [place];  Service:  Gastroenterology;  Laterality: N/A;  . HEMOSTASIS CLIP PLACEMENT  03/31/2020   Procedure: HEMOSTASIS CLIP PLACEMENT;  Surgeon: Irving Copas., MD;  Location: [place];  Service: Gastroenterology;;  . HERNIA REPAIR    . POLYPECTOMY  03/31/2020   Procedure: POLYPECTOMY;  Surgeon: Mansouraty, Telford Nab., MD;  Location: [place];  Service: Gastroenterology;;  . SMALL INTESTINE SURGERY    . SUBMUCOSAL LIFTING INJECTION  03/31/2020   Procedure: SUBMUCOSAL LIFTING INJECTION;  Surgeon: Rush Landmark Telford Nab., MD;  Location: [place];  Service: Gastroenterology;;    Home Medications:  Allergies as of 08/29/2020      Reactions   Testosterone Rash   patch      Medication List       Accurate as of August 29, 2020 12:50 PM. If you have any questions, ask your nurse or doctor.        ascorbic acid 500 MG tablet Commonly known as: VITAMIN C Take 500 mg by mouth daily.   aspirin 81 MG chewable tablet Chew 81 mg by mouth daily.   atorvastatin 10 MG tablet Commonly known as: LIPITOR Take 1 tablet (10 mg total) by mouth daily at 6 PM.   b complex vitamins tablet Take 1 tablet by mouth daily.   CRANBERRY FRUIT PO Take 1 tablet by mouth daily.   finasteride 5 MG tablet Commonly known as: PROSCAR TAKE 1 TABLET BY MOUTH EVERY DAY   glucose blood test strip Check blood sugar up to 4 times daily   insulin NPH Human 100 UNIT/ML injection Commonly known as: NovoLIN N Inject 0.4  mLs (40 Units total) into the skin at bedtime. May use reduced dose as advised. What changed:   how much to take  when to take this  reasons to take this   Jardiance 25 MG Tabs tablet Generic drug: empagliflozin TAKE 1 TABLET BY MOUTH EVERY DAY   levalbuterol 45 MCG/ACT inhaler Commonly known as: XOPENEX HFA Inhale 1-2 puffs into the lungs every 6 (six) hours as needed for wheezing.   metFORMIN 1000 MG tablet Commonly known as: GLUCOPHAGE TAKE 1 TABLET (1,000 MG TOTAL) BY MOUTH 2  (TWO) TIMES DAILY WITH A MEAL.   Ozempic (1 MG/DOSE) 2 MG/1.5ML Sopn Generic drug: Semaglutide (1 MG/DOSE) Inject 1 mg into the skin once a week.   tamsulosin 0.4 MG Caps capsule Commonly known as: FLOMAX Take 1 capsule (0.4 mg total) by mouth daily after supper.   valsartan 80 MG tablet Commonly known as: DIOVAN Take 1 tablet (80 mg total) by mouth daily.   vitamin E 180 MG (400 UNITS) capsule Take 400 Units by mouth daily.       Allergies:  Allergies  Allergen Reactions  . Testosterone Rash    patch    Family History: Family History  Problem Relation Age of Onset  . Heart disease Mother 82  . Heart disease Father   . Stroke Father 23  . Diabetes Father   . Heart attack Father   . Liver disease Neg Hx   . Colon cancer Neg Hx   . Esophageal cancer Neg Hx   . Pancreatic cancer Neg Hx   . Stomach cancer Neg Hx   . Inflammatory bowel disease Neg Hx   . Rectal cancer Neg Hx     Social History:  reports that he quit smoking about 51 years ago. His smoking use included cigarettes. He has quit using smokeless tobacco.  His smokeless tobacco use included snuff. He reports previous alcohol use. He reports previous drug use.   Physical Exam: BP (!) 142/81   Pulse 98   Ht 5\' 10"  (1.778 m)   Wt 218 lb (98.9 kg)   BMI 31.28 kg/m   Constitutional:  Alert and oriented, No acute distress. HEENT: [place] AT, moist mucus membranes.  Trachea midline, no masses. Cardiovascular: No clubbing, cyanosis, or edema. Respiratory: Normal respiratory effort, no increased work of breathing. GI: Abdomen is soft, nontender, nondistended, no abdominal masses GU: Prostate 45 g, smooth without nodules Skin: No rashes, bruises or suspicious lesions. Neurologic: Grossly intact, no focal deficits, moving all 4 extremities. Psychiatric: Normal mood and affect.  Laboratory Data: Lab Results  Component Value Date   WBC 5.1 08/12/2020   HGB 13.3 08/12/2020   HCT 40.0 08/12/2020   MCV 93.0  08/12/2020   PLT 105 (L) 08/12/2020    Lab Results  Component Value Date   CREATININE 0.71 08/12/2020    Lab Results  Component Value Date   PSA 0.15 08/12/2020    Assessment & Plan:    1.  BPH with severe LUTS  No significant improvement on combination therapy  Bladder scan PVR was 37 mL  We discussed surgical treatment options including minimally invasive procedures (UroLift), TURP/PVP and HoLEP for larger glands  Recommended scheduling cystoscopy/TRUS  He inquired if taking tamsulosin 0.8 mg at nighttime would help with symptoms.  He had no orthostatic symptoms when previously taking 0.4 twice daily and although it will most likely not be beneficial he could certainly try.  I did send in an Rx for a 30-day  double dose tamsulosin trial   Abbie Sons, MD  [facility] [address], [place], [place] [address] [phone]

## 2020-08-29 NOTE — Telephone Encounter (Signed)
Patient had appointment in 07/2020 let me know if you want me to fax any paper work.

## 2020-08-29 NOTE — Telephone Encounter (Signed)
Letter written, printed and signed and also rx pad DC order written.  Please fax to company as requested by patient.  Nobie Putnam, DO [facility] 08/29/2020, 6:48 PM

## 2020-08-30 LAB — URINALYSIS, COMPLETE
Bilirubin, UA: NEGATIVE
Ketones, UA: NEGATIVE
Leukocytes,UA: NEGATIVE
Nitrite, UA: NEGATIVE
Protein,UA: NEGATIVE
RBC, UA: NEGATIVE
Specific Gravity, UA: 1.02 (ref 1.005–1.030)
Urobilinogen, Ur: 0.2 mg/dL (ref 0.2–1.0)
pH, UA: 5 (ref 5.0–7.5)

## 2020-08-30 LAB — MICROSCOPIC EXAMINATION
Bacteria, UA: NONE SEEN
Epithelial Cells (non renal): NONE SEEN /hpf (ref 0–10)

## 2020-09-01 NOTE — Telephone Encounter (Signed)
Patient is aware and letter and Rx faxed.

## 2020-09-08 ENCOUNTER — Other Ambulatory Visit: Payer: Self-pay | Admitting: Family Medicine

## 2020-09-08 DIAGNOSIS — E1169 Type 2 diabetes mellitus with other specified complication: Secondary | ICD-10-CM

## 2020-09-08 DIAGNOSIS — Z794 Long term (current) use of insulin: Secondary | ICD-10-CM

## 2020-09-08 NOTE — Telephone Encounter (Signed)
Requested Prescriptions  Pending Prescriptions Disp Refills  . NOVOLIN N 100 UNIT/ML injection [Pharmacy Med Name: NOVOLIN N 100 UNIT/ML VIAL] 40 mL 1    Sig: INJECT 0.4 MLS (40 UNITS TOTAL) INTO THE SKIN AT BEDTIME. MAY USE REDUCED DOSE AS ADVISED.     Endocrinology:  Diabetes - Insulins Failed - 09/08/2020  1:20 AM      Failed - HBA1C is between 0 and 7.9 and within 180 days    Hgb A1c MFr Bld  Date Value Ref Range Status  08/12/2020 8.3 (H) <5.7 % of total Hgb Final    Comment:    For someone without known diabetes, a hemoglobin A1c value of 6.5% or greater indicates that they may have  diabetes and this should be confirmed with a follow-up  test. . For someone with known diabetes, a value <7% indicates  that their diabetes is well controlled and a value  greater than or equal to 7% indicates suboptimal  control. A1c targets should be individualized based on  duration of diabetes, age, comorbid conditions, and  other considerations. . Currently, no consensus exists regarding use of hemoglobin A1c for diagnosis of diabetes for children. Renella Cunas - Valid encounter within last 6 months    Recent Outpatient Visits          2 weeks ago Annual physical exam   [place], DO   1 month ago [facility] [place], Devonne Doughty, DO   2 months ago [facility] M[place], Lupita Raider, FNP   7 months ago Type 2 diabetes mellitus with other specified complication, with long-term current use of insulin [facility])   [place], DO   8 months ago Rectal bleeding   [place], DO      Future Appointments            In 2 months Parks Ranger, Devonne Doughty, DO [facility] [place]   In 6 months Ralene Bathe, MD [place]

## 2020-09-12 NOTE — Progress Notes (Signed)
09/15/2020  Chief Complaint  Patient presents with  . Cysto     HPI:Jonathan Shelton is a 69 y.o.  male with refractory urinary symptoms related to BPH who presents today to the office for cystoscopy and prostate sizing   Please see previous notes for details.    Patient reports urinary improvement with tamsulosin 0.8 mg nightly.    Blood pressure 112/72, pulse 86, height 5\' 10"  (1.778 m), weight 224 lb (101.6 kg). NED. A&Ox3.   No respiratory distress   Abd soft, NT, ND Normal phallus with bilateral descended testicles    Cystoscopy Procedure Note  Patient identification was confirmed, informed consent was obtained, and patient was prepped using Betadine solution.  Lidocaine jelly was administered per urethral meatus.    Preoperative abx where received prior to procedure.     Pre-Procedure: - Inspection reveals a normal caliber ureteral meatus.  Procedure: The flexible cystoscope was introduced without difficulty - No urethral strictures/lesions are present. - Minimal lateral lobe enlargement of prostate - Elevated bladder neck - Bilateral ureteral orifices identified - Bladder mucosa  reveals no ulcers, tumors, or lesions - No bladder stones - No trabeculation  Retroflexion shows no abnormalities   Post-Procedure: - Patient tolerated the procedure well   Prostate transrectal ultrasound sizing   Informed consent was obtained after discussing risks/benefits of the procedure.  A time out was performed to ensure correct patient identity.   Pre-Procedure: -Transrectal probe was placed without difficulty -Transrectal Ultrasound performed revealing a 24 gm prostate measuring 3.5 x 3.1 x 4.2 cm (length) -No significant hypoechoic or median lobe noted      Assessment/ Plan:  1. BPH with severe LUTS  He notes significant symptom improvement on tamsulosin 0.8 mg  Endoscopically no significant prostate enlargement and prostate volume small  We discussed outlet  procedures however if he was interested in pursuing would recommend urodynamic study prior to any outlet procedure  He has elected to continue tamsulosin  Follow-up 6 months or as needed   I, Selena Batten, am acting as a scribe for Dr. Nicki Reaper C. Dorethia Jeanmarie,  I have reviewed the above documentation for accuracy and completeness, and I agree with the above.   Abbie Sons, MD

## 2020-09-15 ENCOUNTER — Encounter: Payer: Self-pay | Admitting: Urology

## 2020-09-15 ENCOUNTER — Ambulatory Visit (INDEPENDENT_AMBULATORY_CARE_PROVIDER_SITE_OTHER): Payer: Medicare HMO | Admitting: Urology

## 2020-09-15 ENCOUNTER — Other Ambulatory Visit: Payer: Self-pay

## 2020-09-15 VITALS — BP 112/72 | HR 86 | Ht 70.0 in | Wt 224.0 lb

## 2020-09-15 DIAGNOSIS — N401 Enlarged prostate with lower urinary tract symptoms: Secondary | ICD-10-CM

## 2020-09-16 LAB — URINALYSIS, COMPLETE
Bilirubin, UA: NEGATIVE
Ketones, UA: NEGATIVE
Leukocytes,UA: NEGATIVE
Nitrite, UA: NEGATIVE
RBC, UA: NEGATIVE
Specific Gravity, UA: 1.025 (ref 1.005–1.030)
Urobilinogen, Ur: 0.2 mg/dL (ref 0.2–1.0)
pH, UA: 5 (ref 5.0–7.5)

## 2020-09-16 LAB — MICROSCOPIC EXAMINATION
Bacteria, UA: NONE SEEN
RBC, Urine: NONE SEEN /hpf (ref 0–2)

## 2020-09-17 ENCOUNTER — Encounter: Payer: Self-pay | Admitting: Urology

## 2020-09-21 ENCOUNTER — Encounter: Payer: Self-pay | Admitting: Urology

## 2020-09-22 ENCOUNTER — Other Ambulatory Visit: Payer: Self-pay | Admitting: Family Medicine

## 2020-09-22 DIAGNOSIS — I10 Essential (primary) hypertension: Secondary | ICD-10-CM

## 2020-09-22 NOTE — Telephone Encounter (Signed)
Medication Refill - Medication:   valsartan (DIOVAN) 80 MG tablet     Preferred Pharmacy (with phone number or street name):  [id number] [name], [place] Phone:  [phone]  Fax:  [phone]       Agent: Please be advised that RX refills may take up to 3 business days. We ask that you follow-up with your pharmacy.

## 2020-09-23 ENCOUNTER — Other Ambulatory Visit: Payer: Self-pay

## 2020-09-23 ENCOUNTER — Ambulatory Visit (INDEPENDENT_AMBULATORY_CARE_PROVIDER_SITE_OTHER): Payer: Medicare HMO | Admitting: Vascular Surgery

## 2020-09-23 VITALS — BP 126/78 | HR 91 | Resp 18 | Ht 70.0 in | Wt 229.0 lb

## 2020-09-23 DIAGNOSIS — I83813 Varicose veins of bilateral lower extremities with pain: Secondary | ICD-10-CM | POA: Diagnosis not present

## 2020-09-23 NOTE — Progress Notes (Signed)
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 Allergy   . BPH (benign prostatic hyperplasia)   . Colon polyps   . Diabetes ([place])   . Diverticulosis   . Gallstones   . GERD (gastroesophageal reflux disease)   . HOH (hard of hearing)   . Kidney stones   . Renal disorder   . Wears glasses     Past Surgical History:  Procedure Laterality Date  . CARPAL TUNNEL RELEASE    . CHOLECYSTECTOMY    . COLONOSCOPY WITH PROPOFOL N/A 02/06/2020   Procedure: COLONOSCOPY WITH PROPOFOL;  Surgeon: Lin Landsman, MD;  Location[facility] ENDOSCOPY;  Service: Gastroenterology;  Laterality: N/A;  . COLONOSCOPY WITH PROPOFOL N/A 03/31/2020   Procedure: COLONOSCOPY WITH PROPOFOL;  Surgeon: Rush Landmark Telford Nab., MD;  Location: [place];  Service: Gastroenterology;  Laterality: N/A;  . ENDOSCOPIC MUCOSAL RESECTION N/A 03/31/2020   Procedure: ENDOSCOPIC MUCOSAL RESECTION;  Surgeon: Rush Landmark Telford Nab., MD;  Location: [place];  Service: Gastroenterology;  Laterality: N/A;  . HEMOSTASIS CLIP PLACEMENT  03/31/2020   Procedure: HEMOSTASIS CLIP PLACEMENT;  Surgeon: Irving Copas., MD;  Location: [place];  Service: Gastroenterology;;  . HERNIA REPAIR    . POLYPECTOMY  03/31/2020   Procedure: POLYPECTOMY;  Surgeon: Mansouraty, Telford Nab., MD;  Location: [place];  Service: Gastroenterology;;  . SMALL INTESTINE SURGERY    . SUBMUCOSAL LIFTING INJECTION  03/31/2020   Procedure: SUBMUCOSAL LIFTING INJECTION;  Surgeon: Rush Landmark Telford Nab., MD;  Location: [place];  Service: Gastroenterology;;    Current Outpatient Medications  Medication Sig Dispense Refill  . ascorbic acid (VITAMIN C) 500 MG tablet Take 500 mg by mouth daily.    Marland Kitchen aspirin 81 MG chewable tablet Chew 81 mg by mouth daily.     Marland Kitchen atorvastatin (LIPITOR) 10 MG tablet Take 1 tablet (10 mg total) by mouth daily at 6 PM. 90 tablet  1  . b complex vitamins tablet Take 1 tablet by mouth daily.    Marland Kitchen CRANBERRY FRUIT PO Take 1 tablet by mouth daily.     . finasteride (PROSCAR) 5 MG tablet TAKE 1 TABLET BY MOUTH EVERY DAY 90 tablet 1  . glucose blood test strip Check blood sugar up to 4 times daily 400 each 3  . JARDIANCE 25 MG TABS tablet TAKE 1 TABLET BY MOUTH EVERY DAY 90 tablet 1  . levalbuterol (XOPENEX HFA) 45 MCG/ACT inhaler Inhale 1-2 puffs into the lungs every 6 (six) hours as needed for wheezing. 15 g 0  . metFORMIN (GLUCOPHAGE) 1000 MG tablet TAKE 1 TABLET (1,000 MG TOTAL) BY MOUTH 2 (TWO) TIMES DAILY WITH A MEAL. 180 tablet 1  . NOVOLIN N 100 UNIT/ML injection INJECT 0.4 MLS (40 UNITS TOTAL) INTO THE SKIN AT BEDTIME. MAY USE REDUCED DOSE AS ADVISED. 40 mL 1  . OZEMPIC, 1 MG/DOSE, 2 MG/1.5ML SOPN Inject 1 mg into the skin once a week. 2 pen 5  . tamsulosin (FLOMAX) 0.4 MG CAPS capsule Take 1 capsule (0.4 mg total) by mouth daily after supper. 90 capsule 1  . tamsulosin (FLOMAX) 0.4 MG CAPS capsule Take 2 capsules (0.8 mg total) by mouth daily. 60 capsule 0  . valsartan (DIOVAN) 80 MG tablet Take 1 tablet (80 mg total) by mouth daily. 90 tablet 3  . vitamin E 400 UNIT capsule Take 400 Units by mouth daily.     No  current facility-administered medications for this visit.    Allergies  Allergen Reactions  . Testosterone Rash    patch    Indication: Patient presents with symptomatic varicose veins of the right lower extremity.  Procedure: Foam sclerotherapy was performed on the right lower extremity. Using ultrasound guidance, 5 mL of foam Sotradecol was used to inject the varicosities of the right lower extremity. Compression wraps were placed. The patient tolerated the procedure well.

## 2020-09-25 ENCOUNTER — Telehealth: Payer: Self-pay | Admitting: *Deleted

## 2020-09-25 ENCOUNTER — Other Ambulatory Visit: Payer: Self-pay | Admitting: Urology

## 2020-09-25 NOTE — Telephone Encounter (Signed)
Patient states he is doing well taking the two pills daily of Tamsulosin

## 2020-09-29 ENCOUNTER — Other Ambulatory Visit: Payer: Self-pay | Admitting: Family Medicine

## 2020-09-29 DIAGNOSIS — E1169 Type 2 diabetes mellitus with other specified complication: Secondary | ICD-10-CM

## 2020-09-29 DIAGNOSIS — Z794 Long term (current) use of insulin: Secondary | ICD-10-CM

## 2020-10-21 ENCOUNTER — Ambulatory Visit (INDEPENDENT_AMBULATORY_CARE_PROVIDER_SITE_OTHER): Payer: Medicare HMO | Admitting: Vascular Surgery

## 2020-10-24 ENCOUNTER — Encounter (INDEPENDENT_AMBULATORY_CARE_PROVIDER_SITE_OTHER): Payer: Self-pay | Admitting: Vascular Surgery

## 2020-10-24 ENCOUNTER — Ambulatory Visit (INDEPENDENT_AMBULATORY_CARE_PROVIDER_SITE_OTHER): Payer: Medicare HMO | Admitting: Vascular Surgery

## 2020-10-24 ENCOUNTER — Other Ambulatory Visit: Payer: Self-pay

## 2020-10-24 VITALS — BP 116/72 | HR 83 | Ht 70.0 in | Wt 230.0 lb

## 2020-10-24 DIAGNOSIS — E785 Hyperlipidemia, unspecified: Secondary | ICD-10-CM | POA: Insufficient documentation

## 2020-10-24 DIAGNOSIS — I83813 Varicose veins of bilateral lower extremities with pain: Secondary | ICD-10-CM | POA: Diagnosis not present

## 2020-10-24 NOTE — Progress Notes (Signed)
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 Allergy   . BPH (benign prostatic hyperplasia)   . Colon polyps   . Diabetes ([place])   . Diverticulosis   . Gallstones   . GERD (gastroesophageal reflux disease)   . HOH (hard of hearing)   . Kidney stones   . Renal disorder   . Wears glasses     Past Surgical History:  Procedure Laterality Date  . CARPAL TUNNEL RELEASE    . CHOLECYSTECTOMY    . COLONOSCOPY WITH PROPOFOL N/A 02/06/2020   Procedure: COLONOSCOPY WITH PROPOFOL;  Surgeon: Lin Landsman, MD;  Location: [facility] ENDOSCOPY;  Service: Gastroenterology;  Laterality: N/A;  . COLONOSCOPY WITH PROPOFOL N/A 03/31/2020   Procedure: COLONOSCOPY WITH PROPOFOL;  Surgeon: Rush Landmark Telford Nab., MD;  Location: [place];  Service: Gastroenterology;  Laterality: N/A;  . ENDOSCOPIC MUCOSAL RESECTION N/A 03/31/2020   Procedure: ENDOSCOPIC MUCOSAL RESECTION;  Surgeon: Rush Landmark Telford Nab., MD;  Location: [place];  Service: Gastroenterology;  Laterality: N/A;  . HEMOSTASIS CLIP PLACEMENT  03/31/2020   Procedure: HEMOSTASIS CLIP PLACEMENT;  Surgeon: Irving Copas., MD;  Location: [place];  Service: Gastroenterology;;  . HERNIA REPAIR    . POLYPECTOMY  03/31/2020   Procedure: POLYPECTOMY;  Surgeon: Mansouraty, Telford Nab., MD;  Location: [place];  Service: Gastroenterology;;  . SMALL INTESTINE SURGERY    . SUBMUCOSAL LIFTING INJECTION  03/31/2020   Procedure: SUBMUCOSAL LIFTING INJECTION;  Surgeon: Rush Landmark Telford Nab., MD;  Location: [place];  Service: Gastroenterology;;    Current Outpatient Medications  Medication Sig Dispense Refill  . ascorbic acid (VITAMIN C) 500 MG tablet Take 500 mg by mouth daily.    Marland Kitchen aspirin 81 MG chewable tablet Chew 81 mg by mouth daily.     Marland Kitchen atorvastatin (LIPITOR) 10 MG tablet Take 1 tablet (10 mg total) by mouth daily at 6 PM. 90 tablet  1  . b complex vitamins tablet Take 1 tablet by mouth daily.    Marland Kitchen CRANBERRY FRUIT PO Take 1 tablet by mouth daily.     . finasteride (PROSCAR) 5 MG tablet TAKE 1 TABLET BY MOUTH EVERY DAY 90 tablet 1  . glucose blood test strip Check blood sugar up to 4 times daily 400 each 3  . insulin NPH-regular Human (NOVOLIN 70/30) (70-30) 100 UNIT/ML injection Novolin 70/30 U-100 Insulin 100 unit/mL subcutaneous suspension    . JARDIANCE 25 MG TABS tablet TAKE 1 TABLET BY MOUTH EVERY DAY 90 tablet 1  . levalbuterol (XOPENEX HFA) 45 MCG/ACT inhaler Inhale 1-2 puffs into the lungs every 6 (six) hours as needed for wheezing. 15 g 0  . losartan (COZAAR) 50 MG tablet losartan 50 mg tablet    . metFORMIN (GLUCOPHAGE) 1000 MG tablet TAKE 1 TABLET (1,000 MG TOTAL) BY MOUTH 2 (TWO) TIMES DAILY WITH A MEAL. 180 tablet 1  . NOVOLIN N 100 UNIT/ML injection INJECT 0.4 MLS (40 UNITS TOTAL) INTO THE SKIN AT BEDTIME. MAY USE REDUCED DOSE AS ADVISED. 40 mL 1  . OZEMPIC, 1 MG/DOSE, 2 MG/1.5ML SOPN Inject 1 mg into the skin once a week. 2 pen 5  . tamsulosin (FLOMAX) 0.4 MG CAPS capsule TAKE 2 CAPSULES BY MOUTH EVERY DAY 60 capsule 6  . valsartan (DIOVAN) 80 MG tablet Take 1 tablet (80 mg total) by mouth daily. 90 tablet 3  . vitamin E 400 UNIT capsule Take  400 Units by mouth daily.    Marland Kitchen VITAMIN E, TOPICAL, CREA vitamin E Take No date recorded No form recorded No frequency recorded No route recorded No set duration recorded No set duration amount recorded active No dosage strength recorded No dosage strength units of measure recorded     No current facility-administered medications for this visit.    Allergies  Allergen Reactions  . Testosterone Rash    patch    Indication: Patient presents with symptomatic varicose veins of the right lower extremity.  Procedure: Foam sclerotherapy was performed on the right lower extremity. Using ultrasound guidance, 5 mL of foam Sotradecol was used to inject the varicosities of the  right lower extremity. Compression wraps were placed. The patient tolerated the procedure well.

## 2020-11-14 ENCOUNTER — Other Ambulatory Visit: Payer: Self-pay | Admitting: Family Medicine

## 2020-11-14 DIAGNOSIS — E1169 Type 2 diabetes mellitus with other specified complication: Secondary | ICD-10-CM

## 2020-11-17 ENCOUNTER — Other Ambulatory Visit: Payer: Self-pay | Admitting: Orthopedic Surgery

## 2020-11-18 ENCOUNTER — Ambulatory Visit (INDEPENDENT_AMBULATORY_CARE_PROVIDER_SITE_OTHER): Payer: Medicare HMO | Admitting: Vascular Surgery

## 2020-11-18 ENCOUNTER — Other Ambulatory Visit: Admission: RE | Admit: 2020-11-18 | Payer: Medicare HMO | Source: Ambulatory Visit

## 2020-11-18 NOTE — Progress Notes (Unsigned)
[place] [facility] Phone: [phone] Subjective:   Jonathan Shelton, am serving as a scribe for Dr. Hulan Saas. This visit occurred during the SARS-CoV-2 public health emergency.  Safety protocols were in place, including screening questions prior to the visit, additional usage of staff PPE, and extensive cleaning of exam room while observing appropriate contact time as indicated for disinfecting solutions.   I'm seeing this patient by the request  of:  [name], DO  CC: Back pain after fall  [id number]  Jonathan Shelton is a 70 y.o. male coming in with complaint of back pain for one week. Patient slipped when putting his underwear on. Patient fell back into bathtub with legs hanging over edge of tub. Heard his back crack. Pain is 9-10/10. Pain is over spine and to each side of lumbar spine. Denies any radiating pain. Has been using Aleve for pain.  Patient is concerned because he has noticed that he is fallen twice in the last months.  Patient feels like he is weak.  Patient states even if he falls he is concerned that he does not know if he would be able to get up.  Patient is concerned that this could be potentially a side effect to the statin he is having.  States it is not as much pain just more weakness though.  Patient has follow-up with his primary care next week.   Patient is scheduled for a right total knee arthroplasty on November 27, 2020  Past Medical History:  Diagnosis Date  . Allergy   . BPH (benign prostatic hyperplasia)   . Colon polyps   . Diabetes ([place])   . Diverticulosis   . Gallstones   . GERD (gastroesophageal reflux disease)   . HOH (hard of hearing)   . Kidney stones   . Renal disorder   . Wears glasses    Past Surgical History:  Procedure Laterality Date  . CARPAL TUNNEL RELEASE    . CHOLECYSTECTOMY    . COLONOSCOPY WITH PROPOFOL N/A 02/06/2020   Procedure: COLONOSCOPY  WITH PROPOFOL;  Surgeon: Lin Landsman, MD;  Location: [facility] ENDOSCOPY;  Service: Gastroenterology;  Laterality: N/A;  . COLONOSCOPY WITH PROPOFOL N/A 03/31/2020   Procedure: COLONOSCOPY WITH PROPOFOL;  Surgeon: Rush Landmark Telford Nab., MD;  Location: [place];  Service: Gastroenterology;  Laterality: N/A;  . ENDOSCOPIC MUCOSAL RESECTION N/A 03/31/2020   Procedure: ENDOSCOPIC MUCOSAL RESECTION;  Surgeon: Rush Landmark Telford Nab., MD;  Location: [place];  Service: Gastroenterology;  Laterality: N/A;  . HEMOSTASIS CLIP PLACEMENT  03/31/2020   Procedure: HEMOSTASIS CLIP PLACEMENT;  Surgeon: Irving Copas., MD;  Location: [place];  Service: Gastroenterology;;  . HERNIA REPAIR    . POLYPECTOMY  03/31/2020   Procedure: POLYPECTOMY;  Surgeon: Mansouraty, Telford Nab., MD;  Location: [place];  Service: Gastroenterology;;  . SMALL INTESTINE SURGERY    . SUBMUCOSAL LIFTING INJECTION  03/31/2020   Procedure: SUBMUCOSAL LIFTING INJECTION;  Surgeon: Rush Landmark Telford Nab., MD;  Location: [facility] ENDOSCOPY;  Service: Gastroenterology;;   Social History   Socioeconomic History  . Marital status: Married    Spouse name: Not on file  . Number of children: Not on file  . Years of education: Graduate Degree  . Highest education level: Master's degree (e.g., MA, MS, MEng, MEd, MSW, MBA)  Occupational History  . Not on file  Tobacco Use  . Smoking status: Former Smoker    Types: Cigarettes  Quit date: 1970    Years since quitting: 52.0  . Smokeless tobacco: Former Neurosurgeon    Types: Snuff  Vaping Use  . Vaping Use: Never used  Substance and Sexual Activity  . Alcohol use: Not Currently  . Drug use: Not Currently    Comment: past  . Sexual activity: Not on file  Other Topics Concern  . Not on file  Social History Narrative  . Not on file   Social Determinants of Health   Financial Resource Strain: Not on file  Food Insecurity: Not on file  Transportation Needs: Not on  file  Physical Activity: Not on file  Stress: Not on file  Social Connections: Not on file   Allergies  Allergen Reactions  . Testosterone Rash    patch   Family History  Problem Relation Age of Onset  . Heart disease Mother 62  . Heart disease Father   . Stroke Father 71  . Diabetes Father   . Heart attack Father   . Liver disease Neg Hx   . Colon cancer Neg Hx   . Esophageal cancer Neg Hx   . Pancreatic cancer Neg Hx   . Stomach cancer Neg Hx   . Inflammatory bowel disease Neg Hx   . Rectal cancer Neg Hx     Current Outpatient Medications (Endocrine & Metabolic):  Marland Kitchen  JARDIANCE 25 MG TABS tablet, TAKE 1 TABLET BY MOUTH EVERY DAY (Patient taking differently: Take 25 mg by mouth daily.) .  metFORMIN (GLUCOPHAGE) 1000 MG tablet, TAKE 1 TABLET (1,000 MG TOTAL) BY MOUTH 2 (TWO) TIMES DAILY WITH A MEAL. Marland Kitchen  NOVOLIN N 100 UNIT/ML injection, INJECT 0.4 MLS (40 UNITS TOTAL) INTO THE SKIN AT BEDTIME. MAY USE REDUCED DOSE AS ADVISED. (Patient taking differently: Inject 40 Units into the skin at bedtime.) .  OZEMPIC, 1 MG/DOSE, 2 MG/1.5ML SOPN, Inject 1 mg into the skin once a week.  Current Outpatient Medications (Cardiovascular):  .  atorvastatin (LIPITOR) 10 MG tablet, TAKE 1 TABLET (10 MG TOTAL) BY MOUTH DAILY AT 6 PM. .  losartan (COZAAR) 50 MG tablet, Take 50 mg by mouth daily. .  valsartan (DIOVAN) 80 MG tablet, Take 1 tablet (80 mg total) by mouth daily.  Current Outpatient Medications (Respiratory):  .  levalbuterol (XOPENEX HFA) 45 MCG/ACT inhaler, Inhale 1-2 puffs into the lungs every 6 (six) hours as needed for wheezing.  Current Outpatient Medications (Analgesics):  .  aspirin 81 MG chewable tablet, Chew 81 mg by mouth daily.    Current Outpatient Medications (Other):  .  ascorbic acid (VITAMIN C) 500 MG tablet, Take 500 mg by mouth daily. Marland Kitchen  b complex vitamins tablet, Take 1 tablet by mouth daily. Marland Kitchen  CRANBERRY FRUIT PO, Take 1 tablet by mouth daily.  .  finasteride  (PROSCAR) 5 MG tablet, TAKE 1 TABLET BY MOUTH EVERY DAY (Patient taking differently: Take 5 mg by mouth daily.) .  gabapentin (NEURONTIN) 100 MG capsule, Take 2 capsules (200 mg total) by mouth at bedtime. Marland Kitchen  glucose blood test strip, Check blood sugar up to 4 times daily .  tamsulosin (FLOMAX) 0.4 MG CAPS capsule, TAKE 2 CAPSULES BY MOUTH EVERY DAY (Patient taking differently: Take 0.8 mg by mouth daily.) .  vitamin E 400 UNIT capsule, Take 400 Units by mouth daily. Marland Kitchen  zinc gluconate 50 MG tablet, Take 50 mg by mouth daily.   Reviewed prior external information including notes and imaging from  primary care provider As well  as notes that were available from care everywhere and other healthcare systems.  Past medical history, social, surgical and family history all reviewed in electronic medical record.  Shelton pertanent information unless stated regarding to the chief complaint.   Review of Systems:  Shelton headache, visual changes, nausea, vomiting, diarrhea, constipation, dizziness, abdominal pain, skin rash, fevers, chills, night sweats, weight loss, swollen lymph nodes, body aches, joint swelling, chest pain, shortness of breath, mood changes. POSITIVE muscle aches, body aches and weakness  Objective  Blood pressure 122/70, pulse 78, height 5\' 10"  (1.778 m), weight 236 lb (107 kg), SpO2 97 %.   General: Shelton apparent distress alert and oriented x3 mood and affect normal, dressed appropriately.  HEENT: Pupils equal, extraocular movements intact  Respiratory: Patient's speak in full sentences and does not appear short of breath  Cardiovascular: Trace lower extremity edema, non tender, Shelton erythema  Low back exam does have some loss of lordosis.  Severely tender to palpation over the midline from L3-L5 tightness with straight leg test but Shelton true radicular symptoms.  Mild worsening pain with extension.  Mild pain in the paraspinal musculature of the lumbar spine at L5-S1 bilaterally.    Impression  and Recommendations:     The above documentation has been reviewed and is accurate and complete Lyndal Pulley, DO

## 2020-11-19 ENCOUNTER — Other Ambulatory Visit: Payer: Self-pay

## 2020-11-19 ENCOUNTER — Ambulatory Visit: Payer: Medicare HMO | Admitting: Family Medicine

## 2020-11-19 ENCOUNTER — Ambulatory Visit (INDEPENDENT_AMBULATORY_CARE_PROVIDER_SITE_OTHER): Payer: Medicare HMO

## 2020-11-19 ENCOUNTER — Encounter: Payer: Self-pay | Admitting: Family Medicine

## 2020-11-19 VITALS — BP 122/70 | HR 78 | Ht 70.0 in | Wt 236.0 lb

## 2020-11-19 DIAGNOSIS — M545 Low back pain, unspecified: Secondary | ICD-10-CM | POA: Diagnosis not present

## 2020-11-19 MED ORDER — GABAPENTIN 100 MG PO CAPS: 200.0000 mg | ORAL_CAPSULE | Freq: Every day | ORAL | 0 refills | Status: DC

## 2020-11-19 NOTE — Assessment & Plan Note (Signed)
Patient has acute low back pain.  Fall from 1 week ago.  Larey Seat very hard and had an audible pop.  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  Patient's pain seems to be more around the midline from L3-S1.  We discussed different treatment options.  Started on a low dose of gabapentin to his trach at night to help him with some of the pain.  We discussed vitamin D supplementation over-the-counter.  Patient has been taking ibuprofen and will change to meloxicam of a low dose.  With the severity of the pain we discussed the possibility of advanced imaging as well as formal physical therapy.  At this point I would like to actually order them both including the MRI secondary to the severity of the symptoms patient is having but start the referral for formal physical therapy as well with patient making some mild improvement already over the course last week.  Patient is concerned secondary to some weakness he has been having and he is seeing his primary care provider in the next 5 days.  Patient knows if things get significantly worse to seek medical attention immediately.  For primary care provider I would encourage potential checking sedimentation rate, CRP, CK levels secondary to the statin and potentially an ANA.  We discussed potentially getting them drawn today but patient wanted me to draw his other primary care labs and I would like primary care to be able to follow-up on those more appropriately.  Patient did have Covid that could have contributed to some of the weakness he is having as well.

## 2020-11-19 NOTE — Patient Instructions (Addendum)
Vit D 4000IU daily Meloxicam 7.16m Do not use NSAIDS such as Advil or Aleve when taking Meloxicam It is ok to use Tylenol for additional pain relief Gabapentin 2050mat night Labs with PCP-add CK level, ESR, ANA, RF [facility] IF worsening please seek medication attention in ER Can do CT if not better See me again in 3-4 weeks

## 2020-11-20 ENCOUNTER — Other Ambulatory Visit: Payer: Self-pay

## 2020-11-20 ENCOUNTER — Telehealth: Payer: Self-pay | Admitting: Family Medicine

## 2020-11-20 DIAGNOSIS — M545 Low back pain, unspecified: Secondary | ICD-10-CM

## 2020-11-20 MED ORDER — MELOXICAM 7.5 MG PO TABS: 7.5000 mg | ORAL_TABLET | Freq: Every day | ORAL | 0 refills | Status: DC

## 2020-11-20 NOTE — Telephone Encounter (Signed)
Patient called stating that the pharmacy only received one prescription but he was under the impression there were supposed to be two? I am thinking it was the Meloxicam that was not sent in?  Please advise.  Patient would like a call back when this is taken care of.

## 2020-11-20 NOTE — Telephone Encounter (Signed)
Patient notified of MRI order and meloxicam being sent in.

## 2020-11-21 ENCOUNTER — Ambulatory Visit: Payer: Medicare HMO | Admitting: Family Medicine

## 2020-11-21 ENCOUNTER — Telehealth: Payer: Self-pay

## 2020-11-21 NOTE — Telephone Encounter (Signed)
Per a verbal from Dr. Tamala Julian, patient is to take 1-500mg  Tylenol with Meloxicam and continue 200mg  of gabapentin at night. Patient voices understanding.

## 2020-11-21 NOTE — Telephone Encounter (Signed)
Patient called stating that the meloxicam he was prescribed is not helping with the pain and he was wanting to know if he could get a higher dosage or another medication to help with the pain. Patient does not want to go the whole weekend with the pain so really would like a call back today.

## 2020-11-21 NOTE — Telephone Encounter (Addendum)
Dr. Tamala Julian spoke with patient this afternoon to take 1 Tylenol with Meloxicam and to take 200mg  of gabapentin at night. Patient could hear patient was grateful for call back. Recommend though that if pain is worsening that he go into ED. Patient voices understanding.   The above documentation has been reviewed and is accurate and complete  Discussed with him if pain is worse go to ER multiple times.  Lyndal Pulley, DO

## 2020-11-22 ENCOUNTER — Other Ambulatory Visit: Payer: Self-pay

## 2020-11-22 ENCOUNTER — Other Ambulatory Visit: Payer: Self-pay | Admitting: Family Medicine

## 2020-11-22 ENCOUNTER — Ambulatory Visit
Admission: RE | Admit: 2020-11-22 | Discharge: 2020-11-22 | Disposition: A | Discharge status: Home / Self Care | Payer: Medicare HMO | Source: Ambulatory Visit | Attending: Family Medicine | Admitting: Family Medicine

## 2020-11-22 DIAGNOSIS — N401 Enlarged prostate with lower urinary tract symptoms: Secondary | ICD-10-CM

## 2020-11-22 DIAGNOSIS — M545 Low back pain, unspecified: Secondary | ICD-10-CM

## 2020-11-22 DIAGNOSIS — R3914 Feeling of incomplete bladder emptying: Secondary | ICD-10-CM

## 2020-11-24 ENCOUNTER — Other Ambulatory Visit: Payer: Self-pay

## 2020-11-24 ENCOUNTER — Ambulatory Visit (INDEPENDENT_AMBULATORY_CARE_PROVIDER_SITE_OTHER): Payer: Medicare HMO | Admitting: Family Medicine

## 2020-11-24 ENCOUNTER — Encounter: Payer: Self-pay | Admitting: Family Medicine

## 2020-11-24 VITALS — BP 120/82 | HR 71 | Resp 16 | Ht 70.0 in | Wt 236.0 lb

## 2020-11-24 DIAGNOSIS — E1169 Type 2 diabetes mellitus with other specified complication: Secondary | ICD-10-CM | POA: Diagnosis not present

## 2020-11-24 DIAGNOSIS — R29898 Other symptoms and signs involving the musculoskeletal system: Secondary | ICD-10-CM | POA: Diagnosis not present

## 2020-11-24 DIAGNOSIS — Z8616 Personal history of COVID-19: Secondary | ICD-10-CM | POA: Diagnosis not present

## 2020-11-24 DIAGNOSIS — S32010A Wedge compression fracture of first lumbar vertebra, initial encounter for closed fracture: Secondary | ICD-10-CM | POA: Insufficient documentation

## 2020-11-24 DIAGNOSIS — Q7649 Other congenital malformations of spine, not associated with scoliosis: Secondary | ICD-10-CM | POA: Insufficient documentation

## 2020-11-24 DIAGNOSIS — S32010D Wedge compression fracture of first lumbar vertebra, subsequent encounter for fracture with routine healing: Secondary | ICD-10-CM

## 2020-11-24 DIAGNOSIS — Z794 Long term (current) use of insulin: Secondary | ICD-10-CM

## 2020-11-24 DIAGNOSIS — M47816 Spondylosis without myelopathy or radiculopathy, lumbar region: Secondary | ICD-10-CM

## 2020-11-24 DIAGNOSIS — R296 Repeated falls: Secondary | ICD-10-CM

## 2020-11-24 MED ORDER — "INSULIN SYRINGE-NEEDLE U-100 31G X 1/4"" 0.5 ML MISC": 3 refills | Status: AC

## 2020-11-24 MED ORDER — TRULICITY 1.5 MG/0.5ML ~~LOC~~ SOAJ: 1.5000 mg | SUBCUTANEOUS | 0 refills | Status: DC

## 2020-11-24 NOTE — Patient Instructions (Addendum)
Thank you for coming to the office today.  We will check inflammatory markers, A1c sugar, and COVID antibody level  Stop Ozempic. Start Sample Trulicity 1.5mg  dose, use this one pen per week, for 2 weeks, message or call me if you are ready to proceed with this I can place new order for you.  Keep in touch with Dr Tamala Julian upcoming PT should be rescheduled and he referred you for procedure for spine kyphoplasty.   DUE for NON FASTING BLOOD WORK   SCHEDULE "Lab Only" visit in the morning at the clinic for lab draw in 2 days  - Make sure Lab Only appointment is at about 1 week before your next appointment, so that results will be available  For Lab Results, once available within 2-3 days of blood draw, you can can log in to MyChart online to view your results and a brief explanation. Also, we can discuss results at next follow-up visit.   Please schedule a Follow-up Appointment to: Return in about 3 months (around 02/22/2021) for 2 days for non fasting lab only then 3 month DM B3Z on trulicity.  If you have any other questions or concerns, please feel free to call the office or send a message through [place]. You may also schedule an earlier appointment if necessary.  Additionally, you may be receiving a survey about your experience at our office within a few days to 1 week by e-mail or mail. We value your feedback.  Nobie Putnam, DO [place]

## 2020-11-24 NOTE — Progress Notes (Signed)
Subjective:    Patient ID: Jonathan Shelton, male    DOB: 1951/03/09, 70 y.o.   MRN: [id number]  Jonathan Shelton is a 70 y.o. male presenting on 11/24/2020 for Diabetes (3 month follow up)   HPI   CHRONIC DM, Type 2: Last A1c 8.3 (07/2020), attributed to steroids and recent COVID19 CBGs: variable ranges, AM sugar lower in 80-110 range, usually PM sugars >200+ seems to fluctuate Meds:Novolin-N14-25units nightly, Ozempic26m weekly,Jardiance 255mdaily, Metformin 100046mID Reports good compliance. 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 He had a Lumbar MRI see results below, identified L1 compression fracture, he was referred today to pursue Kyphoplasty and also [place] [employer]. Additionally Dr SmiTamala Juliancommended other labs as possible options to investigate other potential causes of patient's muscle / leg weakness Recurrent falls are bothering patient due to leg weakness   Health Maintenance: No prior COVID vaccine. He had history of COVID 07/2020  [facility] 02/11/2020 10/08/2019  Decreased Interest 0 0 0  Down, Depressed, Hopeless 0 0 0  PHQ - 2 Score 0 0 0  Altered sleeping - - -  Tired, decreased energy - - -  Change in appetite - - -  Feeling bad or failure about yourself  - - -  Trouble concentrating - - -  Moving slowly or fidgety/restless - - -  Suicidal thoughts - - -  PHQ-9 Score - - -  Difficult doing work/chores - - -    Social History    Tobacco Use  . Smoking status: Former Smoker    Types: Cigarettes    Quit date: 1970    Years since quitting: 52.0  . Smokeless tobacco: Former UseSystems developer Types: Snuff  Vaping Use  . Vaping Use: Never used  Substance Use Topics  . Alcohol use: Not Currently  . Drug use: Not Currently    Comment: past    Review of Systems Per HPI unless specifically indicated above     Objective:    BP 120/82 (BP Location: Right Arm, Patient Position: Sitting, Cuff Size: Normal)   Pulse 71   Resp 16   Ht _0  (1.778 m)   Wt 236 lb (107 kg)   BMI 33.86 kg/m   Wt Readings from Last 3 Encounters:  11/24/20 236 lb (107 kg)  11/19/20 236 lb (107 kg)  10/24/20 230 lb (104.3 kg)    Physical Exam Vitals and nursing note reviewed.  Constitutional:      General: He is not in acute distress.    Appearance: He is well-developed and well-nourished. He is not diaphoretic.     Comments: Well-appearing, comfortable, cooperative  HENT:     Head: Normocephalic and atraumatic.     Mouth/Throat:     Mouth: Oropharynx is clear and moist.  Eyes:     General:        Right eye: No discharge.        Left eye: No discharge.  Conjunctiva/sclera: Conjunctivae normal.  Cardiovascular:     Rate and Rhythm: Normal rate.  Pulmonary:     Effort: Pulmonary effort is normal.  Musculoskeletal:        General: No edema.  Skin:    General: Skin is warm and dry.     Findings: No erythema or rash.  Neurological:     Mental Status: He is alert and oriented to person, place, and time.  Psychiatric:        Mood and Affect: Mood and affect normal.        Behavior: Behavior normal.     Comments: Well groomed, good eye contact, normal speech and thoughts      I have personally reviewed the radiology report from 11/22/20 MRI Lumbar spine.  Narrative & Impression CLINICAL DATA:  Low back pain.  Fall in bathtub 10 days ago  EXAM: MRI LUMBAR SPINE WITHOUT CONTRAST  TECHNIQUE: Multiplanar, multisequence  MR imaging of the lumbar spine was performed. No intravenous contrast was administered.  COMPARISON:  Lumbar radiograph from 3 days ago  FINDINGS: Segmentation:  5 lumbar type vertebrae  Alignment:  Physiologic  Vertebrae: Wedge deformity with mild height loss at L1, 10% when compared to T12. There is a horizontal fracture plane through the upper body with regional marrow edema. No evidence of lesion or ligamentous injury. No retropulsion.  Conus medullaris and cauda equina: Conus extends to the L1 level. Conus and cauda equina appear normal.  Paraspinal and other soft tissues: No perispinal hematoma or visible strain. Bilateral renal cystic intensities.  Disc levels:  T12- L1: Mild spondylosis  L1-L2: Mild spondylosis  L2-L3: Disc narrowing and right eccentric bulging with ventral spurring. Ligamentum flavum thickening. Moderate spinal stenosis from degenerative disease accentuated by short pedicles.  L3-L4: Disc narrowing and bulging. Mild ligamentum flavum thickening. Moderate to advanced spinal stenosis which is accentuated by short pedicles.  L4-L5: Mild disc bulging and ligamentum flavum thickening. Mild facet spurring. No neural compression  L5-S1:Mild facet spurring.  No neural compression.  IMPRESSION: 1. Acute L1 compression fracture with 10% height loss. 2. Degenerative disease and short pedicles causes spinal stenosis at L2-3 and especially L3-4.   Electronically Signed   By: Monte Fantasia M.D.   On: 11/23/2020 07:08  Results for orders placed or performed in visit on 09/15/20  Microscopic Examination   Urine  Result Value Ref Range   WBC, UA 0-5 0 - 5 /hpf   RBC None seen 0 - 2 /hpf   Epithelial Cells (non renal) 0-10 0 - 10 /hpf   Casts Present (A) None seen /lpf   Cast Type Hyaline casts N/A   Bacteria, UA None seen None seen/Few  Urinalysis, Complete  Result Value Ref Range   Specific Gravity, UA 1.025 1.005 - 1.030    pH, UA 5.0 5.0 - 7.5   Color, UA Yellow Yellow   Appearance Ur Clear Clear   Leukocytes,UA Negative Negative   Protein,UA Trace (A) Negative/Trace   Glucose, UA 2+ (A) Negative   Ketones, UA Negative Negative   RBC, UA Negative Negative   Bilirubin, UA Negative Negative   Urobilinogen, Ur 0.2 0.2 - 1.0 mg/dL   Nitrite, UA Negative Negative   Microscopic Examination See below:       Assessment & Plan:   Problem List Items Addressed This Visit    Spondylosis of lumbar region without myelopathy or radiculopathy   Diabetes mellitus ([place]) - Primary    Last A1c was elevated 8.3  in 07/2020 Now due for repeat, will return with lab draw A1c Still on higher dose GLP1 - but ozempic may not be curbing appetite Complications - some early peripheral neuropathy, obesity - increases risk of future cardiovascular complications   Plan:  1. DC Ozempic. START Trulicity 9.1MB weekly inj sample - x 2 pen given, demo shown, notify us 1-2 weeks and we can re order him more Trulicity 8.4YK weekly dose if this is preferred for him. 2. He may continue / adjust NPH insulin based on fasting CBG goal < 150, now < 20 units often advise can eventually lower further and may discontinue if possible - Ordered new insulin syringe/needle, he is unsure statistics of volume/mL G of needle, will send this order now and can re order in future if he has other specifics he needs - Continue Metformin 1091m BID - Continue Jardiance 264mdaily - may reconsider SGLT2 med if has urinary symptoms that persist, for now will refer to Urology 2. Encourage improved lifestyle - low carb, low sugar diet, reduce portion size, continue improving regular exercise 3. Check CBG, bring log to next visit for review 4. Continue ASA, ARB, Statin      Relevant Medications   Insulin Syringe-Needle U-100 31G X 1/4" 0.5 ML MISC   TRULICITY 1.5 MGZL/9.3TTOPN   Other Relevant Orders   Hemoglobin A1c   Congenital spinal stenosis of lumbar region    Compression fracture of L1 lumbar vertebra (HCC)    Other Visit Diagnoses    Bilateral leg weakness       Relevant Orders   Sed Rate (ESR)   C-reactive protein   ANA   CK (Creatine Kinase)   History of COVID-19       Relevant Orders   SARS-CoV-2 Semi-Quantitative Total Antibody, Spike   Recurrent falls          Lumbar L1 compression fracture Leg weakness Spinal stenosis / DJD Followed by Dr ZaCharlann Boxerhas had prior ortho involvement and prior hospitalization with weakness, recurrent falls. Now recent course has had Lumbar MRI done 11/22/20 see results above Patient was already notified by Dr SmTamala Julianhe was referred [place] [facility] and he has been referred for Kyphoplasty as well - awaiting schedule. - If unresolved or not improving still, future may reconsider refer Neurosurgery given lumbar spinal stenosis etc.  Will add other labs as recommended by Dr SmTamala Julianith inflammatory / autoimmune markers ESR CRP ANA CK.  History COVID 07/2020 Unvaccinated to covid Will check COVID antibody level with upcoming lab by patient request to learn more about status of COVID antibodies / immunity   Meds ordered this encounter  Medications  . Insulin Syringe-Needle U-100 31G X 1/4" 0.5 ML MISC    Sig: Use syringe to inject insulin into skin daily.    Dispense:  100 each    Refill:  3    90 day supply box of 100  . TRULICITY 1.5 MGSV/7.7LTOPN    Sig: Inject 1.5 mg into the skin once a week.    Dispense:  1 mL    Refill:  0     Follow up plan: Return in about 3 months (around 02/22/2021) for 3 month DM A1J0Zn trulicity.  Future labs ordered for return this week 1/12 Weds - A1c, ESR CRP ANA CK, and added COVID antibody   AlNobie PutnamDO [facility] 11/24/2020, 2:12 PM

## 2020-11-24 NOTE — Assessment & Plan Note (Addendum)
Last A1c was elevated 8.3 in 07/2020 Now due for repeat, will return with lab draw A1c Still on higher dose GLP1 - but ozempic may not be curbing appetite Complications - some early peripheral neuropathy, obesity - increases risk of future cardiovascular complications   Plan:  1. DC Ozempic. START Trulicity 1.5mg  weekly inj sample - x 2 pen given, demo shown, notify us 1-2 weeks and we can re order him more Trulicity 1.5mg  weekly dose if this is preferred for him. 2. He may continue / adjust NPH insulin based on fasting CBG goal < 150, now < 20 units often advise can eventually lower further and may discontinue if possible - Ordered new insulin syringe/needle, he is unsure statistics of volume/mL G of needle, will send this order now and can re order in future if he has other specifics he needs - Continue Metformin 1000mg  BID - Continue Jardiance 25mg  daily - may reconsider SGLT2 med if has urinary symptoms that persist, for now will refer to Urology 2. Encourage improved lifestyle - low carb, low sugar diet, reduce portion size, continue improving regular exercise 3. Check CBG, bring log to next visit for review 4. Continue ASA, ARB, Statin

## 2020-11-25 ENCOUNTER — Other Ambulatory Visit: Payer: Medicare HMO

## 2020-11-25 ENCOUNTER — Ambulatory Visit (INDEPENDENT_AMBULATORY_CARE_PROVIDER_SITE_OTHER): Payer: Medicare HMO | Admitting: Vascular Surgery

## 2020-11-25 ENCOUNTER — Other Ambulatory Visit: Payer: Self-pay | Admitting: *Deleted

## 2020-11-25 VITALS — BP 129/75 | HR 76 | Ht 70.0 in | Wt 232.0 lb

## 2020-11-25 DIAGNOSIS — I83813 Varicose veins of bilateral lower extremities with pain: Secondary | ICD-10-CM | POA: Diagnosis not present

## 2020-11-25 DIAGNOSIS — R29898 Other symptoms and signs involving the musculoskeletal system: Secondary | ICD-10-CM

## 2020-11-25 DIAGNOSIS — Z8616 Personal history of COVID-19: Secondary | ICD-10-CM

## 2020-11-25 DIAGNOSIS — E1169 Type 2 diabetes mellitus with other specified complication: Secondary | ICD-10-CM

## 2020-11-25 NOTE — Progress Notes (Signed)
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 Allergy   . BPH (benign prostatic hyperplasia)   . Colon polyps   . Diabetes ([place])   . Diverticulosis   . Gallstones   . GERD (gastroesophageal reflux disease)   . HOH (hard of hearing)   . Kidney stones   . Renal disorder   . Wears glasses     Past Surgical History:  Procedure Laterality Date  . CARPAL TUNNEL RELEASE    . CHOLECYSTECTOMY    . COLONOSCOPY WITH PROPOFOL N/A 02/06/2020   Procedure: COLONOSCOPY WITH PROPOFOL;  Surgeon: Lin Landsman, MD;  [facility] ENDOSCOPY;  Service: Gastroenterology;  Laterality: N/A;  . COLONOSCOPY WITH PROPOFOL N/A 03/31/2020   Procedure: COLONOSCOPY WITH PROPOFOL;  Surgeon: Rush Landmark Telford Nab., MD;  Location: [place];  Service: Gastroenterology;  Laterality: N/A;  . ENDOSCOPIC MUCOSAL RESECTION N/A 03/31/2020   Procedure: ENDOSCOPIC MUCOSAL RESECTION;  Surgeon: Rush Landmark Telford Nab., MD;  Location: [place];  Service: Gastroenterology;  Laterality: N/A;  . HEMOSTASIS CLIP PLACEMENT  03/31/2020   Procedure: HEMOSTASIS CLIP PLACEMENT;  Surgeon: Irving Copas., MD;  Location: [place];  Service: Gastroenterology;;  . HERNIA REPAIR    . POLYPECTOMY  03/31/2020   Procedure: POLYPECTOMY;  Surgeon: Mansouraty, Telford Nab., MD;  Location: [place];  Service: Gastroenterology;;  . SMALL INTESTINE SURGERY    . SUBMUCOSAL LIFTING INJECTION  03/31/2020   Procedure: SUBMUCOSAL LIFTING INJECTION;  Surgeon: Rush Landmark Telford Nab., MD;  Location: [place];  Service: Gastroenterology;;    Current Outpatient Medications  Medication Sig Dispense Refill  . ascorbic acid (VITAMIN C) 500 MG tablet Take 500 mg by mouth daily.    Marland Kitchen aspirin 81 MG chewable tablet Chew 81 mg by mouth daily.     Marland Kitchen atorvastatin (LIPITOR) 10 MG tablet TAKE 1 TABLET (10 MG TOTAL) BY MOUTH DAILY AT 6 PM. 90 tablet  2  . b complex vitamins tablet Take 1 tablet by mouth daily.    Marland Kitchen CRANBERRY FRUIT PO Take 1 tablet by mouth daily.     . finasteride (PROSCAR) 5 MG tablet Take 1 tablet (5 mg total) by mouth daily. 90 tablet 1  . gabapentin (NEURONTIN) 100 MG capsule Take 2 capsules (200 mg total) by mouth at bedtime. 180 capsule 0  . glucose blood test strip Check blood sugar up to 4 times daily 400 each 3  . Insulin Syringe-Needle U-100 31G X 1/4" 0.5 ML MISC Use syringe to inject insulin into skin daily. 100 each 3  . JARDIANCE 25 MG TABS tablet TAKE 1 TABLET BY MOUTH EVERY DAY (Patient taking differently: Take 25 mg by mouth daily.) 90 tablet 1  . levalbuterol (XOPENEX HFA) 45 MCG/ACT inhaler Inhale 1-2 puffs into the lungs every 6 (six) hours as needed for wheezing. 15 g 0  . losartan (COZAAR) 50 MG tablet Take 50 mg by mouth daily.    . meloxicam (MOBIC) 7.5 MG tablet Take 1 tablet (7.5 mg total) by mouth daily. 30 tablet 0  . metFORMIN (GLUCOPHAGE) 1000 MG tablet TAKE 1 TABLET (1,000 MG TOTAL) BY MOUTH 2 (TWO) TIMES DAILY WITH A MEAL. 180 tablet 1  . NOVOLIN N 100 UNIT/ML injection INJECT 0.4 MLS (40 UNITS TOTAL) INTO THE SKIN AT BEDTIME. MAY USE REDUCED DOSE AS ADVISED. (Patient taking differently: Inject 40 Units into the skin at bedtime.) 40 mL 1  .  tamsulosin (FLOMAX) 0.4 MG CAPS capsule TAKE 2 CAPSULES BY MOUTH EVERY DAY (Patient taking differently: Take 0.8 mg by mouth daily.) 60 capsule 6  . TRULICITY 1.5 DJ/4.9FW SOPN Inject 1.5 mg into the skin once a week. 1 mL 0  . valsartan (DIOVAN) 80 MG tablet Take 1 tablet (80 mg total) by mouth daily. 90 tablet 3  . vitamin E 400 UNIT capsule Take 400 Units by mouth daily.    Marland Kitchen zinc gluconate 50 MG tablet Take 50 mg by mouth daily.     No current facility-administered medications for this visit.    Allergies  Allergen Reactions  . Testosterone Rash    patch    Indication: Patient presents with symptomatic varicose veins of the left lower  extremity.  Procedure: Foam sclerotherapy was performed on the left lower extremity. Using ultrasound guidance, 5 mL of foam Sotradecol was used to inject the varicosities of the left lower extremity. Compression wraps were placed. The patient tolerated the procedure well.

## 2020-11-26 ENCOUNTER — Other Ambulatory Visit: Payer: Self-pay

## 2020-11-26 ENCOUNTER — Other Ambulatory Visit: Payer: Medicare HMO

## 2020-11-26 ENCOUNTER — Other Ambulatory Visit: Payer: Self-pay | Admitting: Family Medicine

## 2020-11-26 DIAGNOSIS — S32010D Wedge compression fracture of first lumbar vertebra, subsequent encounter for fracture with routine healing: Secondary | ICD-10-CM

## 2020-11-27 ENCOUNTER — Inpatient Hospital Stay: Admit: 2020-11-27 | Payer: Medicare HMO | Admitting: Orthopedic Surgery

## 2020-11-27 SURGERY — ARTHROPLASTY, KNEE, TOTAL
Anesthesia: Choice | Site: Knee | Laterality: Right

## 2020-11-28 ENCOUNTER — Encounter: Payer: Self-pay | Admitting: *Deleted

## 2020-11-28 ENCOUNTER — Ambulatory Visit
Admission: RE | Admit: 2020-11-28 | Discharge: 2020-11-28 | Disposition: A | Discharge status: Home / Self Care | Payer: Medicare HMO | Source: Ambulatory Visit | Attending: Family Medicine | Admitting: Family Medicine

## 2020-11-28 DIAGNOSIS — S32010D Wedge compression fracture of first lumbar vertebra, subsequent encounter for fracture with routine healing: Secondary | ICD-10-CM

## 2020-11-28 HISTORY — PX: IR RADIOLOGIST EVAL & MGMT: IMG5224

## 2020-11-28 NOTE — Consult Note (Signed)
Chief Complaint: Lumbar back pain  Referring Physician(s): Smith,Zachary M  History of Present Illness: Jonathan Shelton is a 70 y.o. male presenting today to [facility] as a scheduled consultation, kindly referred by Dr. Tamala Julian, for evaluation of back pain and candidacy for vertebral augmentation.   Jonathan Stripling is here today by himself for the consult.    He tells me that about 2 weeks ago he fell backwards at his daughter's house in the bath, and immediately had pain and "heard something crack."     At that time he remembers having intense pain, and was evaluated, revealing an L1 compression fracture on MRI performed 11/22/20.    He is currently taking meloxicam and what sounds like gabapentin.  He tells me that his pain has been improving steadily over the past 2 weeks with the assistance of the medication, and he would rate his pain as 5/10 intensity currently.  Achy quality to the pain.  He is able to drive comfortably and perform his ADL's comfortably.  He does state that he has some pain radiating laterally into the flanks, fairly symmetric at this level of his back.   He denies any additional symptoms such as lower extremity weakness or incontinence/retention.   Past Medical History:  Diagnosis Date  . Allergy   . BPH (benign prostatic hyperplasia)   . Colon polyps   . Diabetes ([place])   . Diverticulosis   . Gallstones   . GERD (gastroesophageal reflux disease)   . HOH (hard of hearing)   . Kidney stones   . Renal disorder   . Wears glasses     Past Surgical History:  Procedure Laterality Date  . CARPAL TUNNEL RELEASE    . CHOLECYSTECTOMY    . COLONOSCOPY WITH PROPOFOL N/A 02/06/2020   Procedure: COLONOSCOPY WITH PROPOFOL;  Surgeon: Lin Landsman, MD;  Location: [facility] ENDOSCOPY;  Service: Gastroenterology;  Laterality: N/A;  . COLONOSCOPY WITH PROPOFOL N/A 03/31/2020   Procedure: COLONOSCOPY WITH PROPOFOL;  Surgeon: Rush Landmark Telford Nab., MD;  Location: [place];  Service: Gastroenterology;  Laterality: N/A;  . ENDOSCOPIC MUCOSAL RESECTION N/A 03/31/2020   Procedure: ENDOSCOPIC MUCOSAL RESECTION;  Surgeon: Rush Landmark Telford Nab., MD;  Location: [place];  Service: Gastroenterology;  Laterality: N/A;  . HEMOSTASIS CLIP PLACEMENT  03/31/2020   Procedure: HEMOSTASIS CLIP PLACEMENT;  Surgeon: Irving Copas., MD;  Location: [place];  Service: Gastroenterology;;  . HERNIA REPAIR    . IR RADIOLOGIST EVAL & MGMT  11/28/2020  . POLYPECTOMY  03/31/2020   Procedure: POLYPECTOMY;  Surgeon: Mansouraty, Telford Nab., MD;  Location: [place];  Service: Gastroenterology;;  . SMALL INTESTINE SURGERY    . SUBMUCOSAL LIFTING INJECTION  03/31/2020   Procedure: SUBMUCOSAL LIFTING INJECTION;  Surgeon: Rush Landmark Telford Nab., MD;  Location: [facility] ENDOSCOPY;  Service: Gastroenterology;;    Allergies: Testosterone  Medications: Prior to Admission medications   Medication Sig Start Date End Date Taking? Authorizing Provider  ascorbic acid (VITAMIN C) 500 MG tablet Take 500 mg by mouth daily.    [provider]  aspirin 81 MG chewable tablet Chew 81 mg by mouth daily.     [provider]  atorvastatin (LIPITOR) 10 MG tablet TAKE 1 TABLET (10 MG TOTAL) BY MOUTH DAILY AT 6 PM. 11/14/20   Karamalegos, Devonne Doughty, DO  b complex vitamins tablet Take 1 tablet by mouth daily.    [provider]  CRANBERRY FRUIT PO Take 1 tablet by mouth daily.  [provider]  finasteride (PROSCAR) 5 MG tablet Take 1 tablet (5 mg total) by mouth daily. 11/24/20   Karamalegos, Devonne Doughty, DO  gabapentin (NEURONTIN) 100 MG capsule Take 2 capsules (200 mg total) by mouth at bedtime. 11/19/20   Lyndal Pulley, DO  glucose blood test strip Check blood sugar up to 4 times daily 07/24/20   [name], DO  Insulin Syringe-Needle U-100 31G X 1/4" 0.5 ML MISC Use syringe to inject insulin into skin daily. 11/24/20   Karamalegos,  Alexander J, DO  JARDIANCE 25 MG TABS tablet TAKE 1 TABLET BY MOUTH EVERY DAY Patient taking differently: Take 25 mg by mouth daily. 09/29/20   Parks Ranger, Devonne Doughty, DO  levalbuterol [facility] HFA) 45 MCG/ACT inhaler Inhale 1-2 puffs into the lungs every 6 (six) hours as needed for wheezing. 07/10/20   [name], DO  losartan (COZAAR) 50 MG tablet Take 50 mg by mouth daily.    [provider]  meloxicam (MOBIC) 7.5 MG tablet Take 1 tablet (7.5 mg total) by mouth daily. 11/20/20   Lyndal Pulley, DO  metFORMIN (GLUCOPHAGE) 1000 MG tablet TAKE 1 TABLET (1,000 MG TOTAL) BY MOUTH 2 (TWO) TIMES DAILY WITH A MEAL. 08/23/20   Karamalegos, Alexander J, DO  NOVOLIN N 100 UNIT/ML injection INJECT 0.4 MLS (40 UNITS TOTAL) INTO THE SKIN AT BEDTIME. MAY USE REDUCED DOSE AS ADVISED. Patient taking differently: Inject 40 Units into the skin at bedtime. 09/08/20   Malfi, Lupita Raider, FNP  tamsulosin (FLOMAX) 0.4 MG CAPS capsule TAKE 2 CAPSULES BY MOUTH EVERY DAY Patient taking differently: Take 0.8 mg by mouth daily. 09/25/20   Stoioff, Ronda Fairly, MD  TRULICITY 1.5 NG/2.9BM SOPN Inject 1.5 mg into the skin once a week. 11/24/20   Karamalegos, Devonne Doughty, DO  valsartan (DIOVAN) 80 MG tablet Take 1 tablet (80 mg total) by mouth daily. 04/01/20   Karamalegos, Devonne Doughty, DO  vitamin E 400 UNIT capsule Take 400 Units by mouth daily.    [provider]  zinc gluconate 50 MG tablet Take 50 mg by mouth daily.    [provider]  albuterol (VENTOLIN HFA) 108 (90 Base) MCG/ACT inhaler Inhale 1-2 puffs into the lungs every 4 (four) hours as needed for wheezing or shortness of breath. 07/10/20 07/10/20  Malfi, Lupita Raider, FNP     Family History  Problem Relation Age of Onset  . Heart disease Mother 87  . Heart disease Father   . Stroke Father 74  . Diabetes Father   . Heart attack Father   . Liver disease Neg Hx   . Colon cancer Neg Hx   . Esophageal cancer Neg Hx   . Pancreatic  cancer Neg Hx   . Stomach cancer Neg Hx   . Inflammatory bowel disease Neg Hx   . Rectal cancer Neg Hx     Social History   Socioeconomic History  . Marital status: Married    Spouse name: Not on file  . Number of children: Not on file  . Years of education: Graduate Degree  . Highest education level: Master's degree (e.g., MA, MS, MEng, MEd, MSW, MBA)  Occupational History  . Not on file  Tobacco Use  . Smoking status: Former Smoker    Types: Cigarettes    Quit date: 1970    Years since quitting: 52.0  . Smokeless tobacco: Former Systems developer    Types: Snuff  Vaping Use  . Vaping Use: Never used  Substance and Sexual Activity  . Alcohol use: Not Currently  . Drug use: Not Currently    Comment: past  . Sexual activity: Not on file  Other Topics Concern  . Not on file  Social History Narrative  . Not on file   Social Determinants of Health   Financial Resource Strain: Not on file  Food Insecurity: Not on file  Transportation Needs: Not on file  Physical Activity: Not on file  Stress: Not on file  Social Connections: Not on file       Review of Systems: A 12 point ROS discussed and pertinent positives are indicated in the HPI above.  All other systems are negative.  Review of Systems  Vital Signs: There were no vitals taken for this visit.  Physical Exam General: 70 yo male appearing stated age.  Well-developed, well-nourished.  No distress. HEENT: Atraumatic, normocephalic.  Glasses. Conjugate gaze, extra-ocular motor intact. No scleral icterus or scleral injection. No lesions on external ears, nose, lips, or gums.  Oral mucosa moist, pink.  Neck: Symmetric with no goiter enlargement.  Chest/Lungs:  Symmetric chest with inspiration/expiration.  No labored breathing.  Clear to auscultation with no wheezes, rhonchi, or rales.  Heart:  RRR, with no third heart sounds appreciated. No JVD appreciated.  Abdomen:  Soft, NT/ND, with + bowel sounds.   Genito-urinary:  Deferred Neurologic: Alert & Oriented to person, place, and time.   Normal affect and insight.  Appropriate questions.  Moving all 4 extremities with gross sensory intact.   MSK: He has reproducible pain in the lumbar region in the midline, near L1, although this is not exquisitely tender.  He also has some discomfort with percussion of the bilateral flank.       Imaging: DG Lumbar Spine Complete W/Bend  Result Date: 11/19/2020 CLINICAL DATA:  Patient fell 1 week ago in bathtub with persistent low back pain. EXAM: LUMBAR SPINE - COMPLETE WITH BENDING VIEWS COMPARISON:  None. FINDINGS: There are 5 non rib-bearing lumbar type vertebral bodies Mild straightening expected lumbar lordosis. There is minimal (approximately 3 mm) of retrolisthesis of L2 upon L3. The amount of retrolisthesis is unchanged given the acquired degrees of flexion and extension. No additional areas of anterolisthesis or retrolisthesis are elicited given the acquired degrees of flexion and extension. No definite pars defects. Age-indeterminate mild (approximately 25%) compression deformities involving the superior endplates of the 624THL and L1 vertebral bodies. 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 Limited visualization of the bilateral SI joints is normal. Large colonic stool burden without evidence of enteric obstruction. Post cholecystectomy. IMPRESSION: 1. Age-indeterminate mild (approximately 25%) compression deformities involving the T12 and L1 vertebral bodies. Correlation for point tenderness at these locations is advised. 2. 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 3. No evidence of lumbar spine instability given the acquired degrees of flexion and extension. Electronically Signed   By: Sandi Mariscal M.D.   On:  11/19/2020 10:54   Jonathan Lumbar Spine Wo Contrast  Result Date: 11/23/2020 CLINICAL DATA:  Low back pain.  Fall in bathtub 10 days ago EXAM: MRI LUMBAR SPINE WITHOUT CONTRAST TECHNIQUE: Multiplanar, multisequence Jonathan imaging of the lumbar spine was performed. No intravenous contrast was administered. COMPARISON:  Lumbar radiograph from 3 days ago FINDINGS: Segmentation:  5 lumbar type vertebrae Alignment:  Physiologic Vertebrae: Wedge deformity  with mild height loss at L1, 10% when compared to T12. There is a horizontal fracture plane through the upper body with regional marrow edema. No evidence of lesion or ligamentous injury. No retropulsion. Conus medullaris and cauda equina: Conus extends to the L1 level. Conus and cauda equina appear normal. Paraspinal and other soft tissues: No perispinal hematoma or visible strain. Bilateral renal cystic intensities. Disc levels: T12- L1: Mild spondylosis L1-L2: Mild spondylosis L2-L3: Disc narrowing and right eccentric bulging with ventral spurring. Ligamentum flavum thickening. Moderate spinal stenosis from degenerative disease accentuated by short pedicles. L3-L4: Disc narrowing and bulging. Mild ligamentum flavum thickening. Moderate to advanced spinal stenosis which is accentuated by short pedicles. L4-L5: Mild disc bulging and ligamentum flavum thickening. Mild facet spurring. No neural compression L5-S1:Mild facet spurring.  No neural compression. IMPRESSION: 1. Acute L1 compression fracture with 10% height loss. 2. Degenerative disease and short pedicles causes spinal stenosis at L2-3 and especially L3-4. Electronically Signed   By: Monte Fantasia M.D.   On: 11/23/2020 07:08   IR Radiologist Eval & Mgmt  Result Date: 11/28/2020 Please refer to notes tab for details about interventional procedure. (Op Note)   Labs:  CBC: Recent Labs    07/20/20 0540 07/21/20 0537 07/23/20 0440 08/12/20 0853  WBC 6.9 8.5 8.6 5.1  HGB 14.5 15.0 14.2 13.3  HCT 43.5 44.6  42.1 40.0  PLT 245 256 243 105*    COAGS: No results for input(s): INR, APTT in the last 8760 hours.  BMP: Recent Labs    07/20/20 0540 07/21/20 0537 07/23/20 0440 08/12/20 0853  NA 139 132* 136 139  K 4.8 4.2 4.2 3.9  CL 103 99 98 104  CO2 26 23 28 24   GLUCOSE 329* 287* 247* 81  BUN 33* 25* 28* 11  CALCIUM 8.5* 8.1* 8.4* 8.8  CREATININE 0.81 0.69 0.75 0.71  GFRNONAA >60 >60 >60 96  GFRAA >60 >60 >60 112    LIVER FUNCTION TESTS: Recent Labs    07/19/20 0455 07/20/20 0540 07/21/20 0537 07/23/20 0440 08/12/20 0853  BILITOT 1.0 1.6* 1.6* 1.3* 0.5  AST 46* 40 41 24 14  ALT 50* 48* 61* 54* 15  ALKPHOS 52 53 54 53  --   PROT 6.4* 5.7* 5.7* 5.5* 6.2  ALBUMIN 2.7* 2.7* 2.6* 2.6*  --     TUMOR MARKERS: No results for input(s): AFPTM, CEA, CA199, CHROMGRNA in the last 8760 hours.  Assessment and Plan:  Jonathan Shelton is a 70 yo male with symptomatic, traumatic L1 compression fracture, which was sustained when he fell.   I had a lengthy discussion regarding the anatomy, pathology/pathophysiology, and treatment options for compression fracture, including vertebral augmentation.   I addressed the goals of therapy, which are primary to decrease pain, and secondarily to restore comfortable function and decrease need for high doses of pain medication, which have side effects.   Regarding vertebral augmentation, specific risks include bleeding, infection, injury to local structure, need for additional surgery/procedure, glue embolization, nerve injury, cardiopulmonary collapse, death.   After our discussion, he would like to continue conservative management, and defer for now.   I did tell him that this is very reasonable, given that he is improving, and his current pain in only 5/10.    Plan: - He would like to continue to use conservative management, and defer any vertebral augmentation for now. - I did let him know that we are happy to consider L1 vertebral augmentation  if he is not  better within the next few weeks.  He understands that he can give [place] a call if he would like to proceed.  - continue current care  Thank you for this interesting consult.  I greatly enjoyed meeting FARON ESTEPA and look forward to participating in their care.  A copy of this report was sent to the requesting provider on this date.  Electronically Signed: Corrie Mckusick 11/28/2020, 1:37 PM   I spent a total of  40 Minutes   in face to face in clinical consultation, greater than 50% of which was counseling/coordinating care for L1 compression fracture, possible vertebral augmentation

## 2020-12-01 LAB — CK: Total CK: 93 U/L (ref 44–196)

## 2020-12-01 LAB — SARS-COV-2 SEMI-QUANTITATIVE TOTAL ANTIBODY, SPIKE: SARS COV2 AB, Total Spike Semi QN: >2500 U/mL — ABNORMAL HIGH (ref <0.8)

## 2020-12-01 LAB — HEMOGLOBIN A1C
Hgb A1c MFr Bld: 7 % of total Hgb — ABNORMAL HIGH (ref <5.7)
Mean Plasma Glucose: 154 mg/dL
eAG (mmol/L): 8.5 mmol/L

## 2020-12-01 LAB — SEDIMENTATION RATE: Sed Rate: 11 mm/h (ref 0–20)

## 2020-12-01 LAB — C-REACTIVE PROTEIN: CRP: 1.2 mg/L (ref <8.0)

## 2020-12-09 ENCOUNTER — Telehealth: Payer: Self-pay

## 2020-12-09 NOTE — Telephone Encounter (Signed)
Pt is due for recall colon per recall with Dr Rush Landmark with 2 day suprep and miralax history of colon polyps. Due in November 2021

## 2020-12-10 ENCOUNTER — Other Ambulatory Visit: Payer: Self-pay

## 2020-12-10 ENCOUNTER — Ambulatory Visit: Payer: Medicare HMO | Admitting: Family Medicine

## 2020-12-10 ENCOUNTER — Encounter: Payer: Self-pay | Admitting: Family Medicine

## 2020-12-10 ENCOUNTER — Ambulatory Visit (INDEPENDENT_AMBULATORY_CARE_PROVIDER_SITE_OTHER): Payer: Medicare HMO

## 2020-12-10 VITALS — BP 120/70 | HR 106 | Ht 70.0 in | Wt 232.0 lb

## 2020-12-10 DIAGNOSIS — S32010D Wedge compression fracture of first lumbar vertebra, subsequent encounter for fracture with routine healing: Secondary | ICD-10-CM

## 2020-12-10 NOTE — Telephone Encounter (Signed)
Inbound call from patient returning your call to schedule procedure.  Can be reached at [phone].

## 2020-12-10 NOTE — Patient Instructions (Signed)
Xray today Call physical therapy [phone] Ok to try gabapentin during the day Stop meloxicam If needed can do IBU occasionally See me again in 4 months

## 2020-12-10 NOTE — Progress Notes (Signed)
[place] Phone: [phone] Subjective:   Jonathan Shelton, am serving as a scribe for Dr. Hulan Saas. This visit occurred during the SARS-CoV-2 public health emergency.  Safety protocols were in place, including screening questions prior to the visit, additional usage of staff PPE, and extensive cleaning of exam room while observing appropriate contact time as indicated for disinfecting solutions.   I'm seeing this patient by the request  of:  [name], DO  CC: Low back pain  [id number]   11/19/2020 Patient has acute low back pain.  Fall from 1 week ago.  [place] very hard and had an audible pop.  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  Patient's pain seems to be more around the midline from L3-S1.  We discussed different treatment options.  Started on a low dose of gabapentin to his trach at night to help him with some of the pain.  We discussed vitamin D supplementation over-the-counter.  Patient has been taking ibuprofen and will change to meloxicam of a low dose.  With the severity of the pain we discussed the possibility of advanced imaging as well as formal physical therapy.  At this point I would like to actually order them both including the MRI secondary to the severity of the symptoms patient is having but start the referral for formal physical therapy as well with patient making some mild improvement already over the course last week.  Patient is concerned secondary to some weakness he has been having and he is seeing his primary care provider in the next 5 days.  Patient knows if things get significantly worse to seek medical attention immediately.  For primary care provider I would encourage potential checking sedimentation rate, CRP, CK levels  secondary to the statin and potentially an ANA.  We discussed potentially getting them drawn today but patient wanted me to draw his other primary care labs and I would like primary care to be able to follow-up on those more appropriately.  Patient did have Covid that could have contributed to some of the weakness he is having as well.  Update 12/10/2020 Jonathan Shelton is a 70 y.o. male coming in with complaint of low back pain.  Patient did have a fall when he was visiting his daughter over the holidays.  Was found to have an acute L1 compression fracture with 10% height loss.  Patient did see IR to discuss the potential for kyphoplasty but decided with improvement to continue with conservative therapy.  We have prescribed meloxicam. States that he is doing better. Using pain medication which helps him sleep. Feels he is about 70% better. Has chosen to let spine heal naturally vs using kyphoplasty. Pain is now a constant, dull ache and is worse when he is standing up.  Patient states that 50% of pain returned sometimes when he stands for greater than 10 minutes.  States that he does notice it even within 5 minutes.   MRI of the lumbar spine on November 23, 2020 showed an acute L1 compression fracture with 10% height loss.     Past Medical History:  Diagnosis Date  . Allergy   . BPH (benign prostatic hyperplasia)   . Colon polyps   . Diabetes ([place])   . Diverticulosis   . Gallstones   .  GERD (gastroesophageal reflux disease)   . HOH (hard of hearing)   . Kidney stones   . Renal disorder   . Wears glasses    Past Surgical History:  Procedure Laterality Date  . CARPAL TUNNEL RELEASE    . CHOLECYSTECTOMY    . COLONOSCOPY WITH PROPOFOL N/A 02/06/2020   Procedure: COLONOSCOPY WITH PROPOFOL;  Surgeon: Lin Landsman, MD;  Location: [facility] ENDOSCOPY;  Service: Gastroenterology;  Laterality: N/A;  . COLONOSCOPY WITH PROPOFOL N/A 03/31/2020   Procedure: COLONOSCOPY WITH PROPOFOL;  Surgeon:  Rush Landmark Telford Nab., MD;  Location: [place];  Service: Gastroenterology;  Laterality: N/A;  . ENDOSCOPIC MUCOSAL RESECTION N/A 03/31/2020   Procedure: ENDOSCOPIC MUCOSAL RESECTION;  Surgeon: Rush Landmark Telford Nab., MD;  Location: [place];  Service: Gastroenterology;  Laterality: N/A;  . HEMOSTASIS CLIP PLACEMENT  03/31/2020   Procedure: HEMOSTASIS CLIP PLACEMENT;  Surgeon: Irving Copas., MD;  Location: [place];  Service: Gastroenterology;;  . HERNIA REPAIR    . IR RADIOLOGIST EVAL & MGMT  11/28/2020  . POLYPECTOMY  03/31/2020   Procedure: POLYPECTOMY;  Surgeon: Mansouraty, Telford Nab., MD;  Location: [place];  Service: Gastroenterology;;  . SMALL INTESTINE SURGERY    . SUBMUCOSAL LIFTING INJECTION  03/31/2020   Procedure: SUBMUCOSAL LIFTING INJECTION;  Surgeon: Rush Landmark Telford Nab., MD;  Location: [facility] ENDOSCOPY;  Service: Gastroenterology;;   Social History   Socioeconomic History  . Marital status: Married    Spouse name: Not on file  . Number of children: Not on file  . Years of education: Graduate Degree  . Highest education level: Master's degree (e.g., MA, MS, MEng, MEd, MSW, MBA)  Occupational History  . Not on file  Tobacco Use  . Smoking status: Former Smoker    Types: Cigarettes    Quit date: 1970    Years since quitting: 52.1  . Smokeless tobacco: Former Systems developer    Types: Snuff  Vaping Use  . Vaping Use: Never used  Substance and Sexual Activity  . Alcohol use: Not Currently  . Drug use: Not Currently    Comment: past  . Sexual activity: Not on file  Other Topics Concern  . Not on file  Social History Narrative  . Not on file   Social Determinants of Health   Financial Resource Strain: Not on file  Food Insecurity: Not on file  Transportation Needs: Not on file  Physical Activity: Not on file  Stress: Not on file  Social Connections: Not on file   Allergies  Allergen Reactions  . Testosterone Rash    patch   Family History   Problem Relation Age of Onset  . Heart disease Mother 75  . Heart disease Father   . Stroke Father 19  . Diabetes Father   . Heart attack Father   . Liver disease Neg Hx   . Colon cancer Neg Hx   . Esophageal cancer Neg Hx   . Pancreatic cancer Neg Hx   . Stomach cancer Neg Hx   . Inflammatory bowel disease Neg Hx   . Rectal cancer Neg Hx     Current Outpatient Medications (Endocrine & Metabolic):  Marland Kitchen  JARDIANCE 25 MG TABS tablet, TAKE 1 TABLET BY MOUTH EVERY DAY (Patient taking differently: Take 25 mg by mouth daily.) .  metFORMIN (GLUCOPHAGE) 1000 MG tablet, TAKE 1 TABLET (1,000 MG TOTAL) BY MOUTH 2 (TWO) TIMES DAILY WITH A MEAL. Marland Kitchen  NOVOLIN N 100 UNIT/ML injection, INJECT 0.4 MLS (40 UNITS TOTAL) INTO THE  SKIN AT BEDTIME. MAY USE REDUCED DOSE AS ADVISED. (Patient taking differently: Inject 40 Units into the skin at bedtime.) .  TRULICITY 1.5 KX/3.8HW SOPN, Inject 1.5 mg into the skin once a week.  Current Outpatient Medications (Cardiovascular):  .  atorvastatin (LIPITOR) 10 MG tablet, TAKE 1 TABLET (10 MG TOTAL) BY MOUTH DAILY AT 6 PM. .  losartan (COZAAR) 50 MG tablet, Take 50 mg by mouth daily. .  valsartan (DIOVAN) 80 MG tablet, Take 1 tablet (80 mg total) by mouth daily.  Current Outpatient Medications (Respiratory):  .  levalbuterol (XOPENEX HFA) 45 MCG/ACT inhaler, Inhale 1-2 puffs into the lungs every 6 (six) hours as needed for wheezing.  Current Outpatient Medications (Analgesics):  .  aspirin 81 MG chewable tablet, Chew 81 mg by mouth daily.    Current Outpatient Medications (Other):  .  ascorbic acid (VITAMIN C) 500 MG tablet, Take 500 mg by mouth daily. Marland Kitchen  b complex vitamins tablet, Take 1 tablet by mouth daily. Marland Kitchen  CRANBERRY FRUIT PO, Take 1 tablet by mouth daily.  .  finasteride (PROSCAR) 5 MG tablet, Take 1 tablet (5 mg total) by mouth daily. Marland Kitchen  gabapentin (NEURONTIN) 100 MG capsule, Take 2 capsules (200 mg total) by mouth at bedtime. Marland Kitchen  glucose blood test  strip, Check blood sugar up to 4 times daily .  Insulin Syringe-Needle U-100 31G X 1/4" 0.5 ML MISC, Use syringe to inject insulin into skin daily. .  tamsulosin (FLOMAX) 0.4 MG CAPS capsule, TAKE 2 CAPSULES BY MOUTH EVERY DAY (Patient taking differently: Take 0.8 mg by mouth daily.) .  vitamin E 400 UNIT capsule, Take 400 Units by mouth daily. Marland Kitchen  zinc gluconate 50 MG tablet, Take 50 mg by mouth daily.   Reviewed prior external information including notes and imaging from  primary care provider As well as notes that were available from care everywhere and other healthcare systems.  Past medical history, social, surgical and family history all reviewed in electronic medical record.  Shelton pertanent information unless stated regarding to the chief complaint.   Review of Systems:  Shelton headache, visual changes, nausea, vomiting, diarrhea, constipation, dizziness, abdominal pain, skin rash, fevers, chills, night sweats, weight loss, swollen lymph nodes, joint swelling, chest pain, shortness of breath, mood changes. POSITIVE muscle aches, body aches  Objective  Blood pressure 120/70, pulse (!) 106, height 5\' 10"  (1.778 m), weight 232 lb (105.2 kg), SpO2 96 %.   General: Shelton apparent distress alert and oriented x3 mood and affect normal, dressed appropriately.  HEENT: Pupils equal, extraocular movements intact  Respiratory: Patient's speak in full sentences and does not appear short of breath  Cardiovascular: Shelton lower extremity edema, non tender, Shelton erythema  Gait mild antalgic Patient's low back does have some loss of lordosis.  Patient has some tightness noted in the lumbar spine.  Patient is very minimally tender to palpation and Shelton significant spinous process tenderness.  Negative straight leg test.  Neurovascularly intact distally in 5 out of 5 strength of the feet bilaterally    Impression and Recommendations:     The above documentation has been reviewed and is accurate and complete Lyndal Pulley, DO

## 2020-12-10 NOTE — Assessment & Plan Note (Signed)
Patient had a compression fracture but is doing relatively well at this time.  Patient states it is more of an ache.  Still with standing has discomfort of the pain with some radiation to the hips bilaterally.  We discussed with patient that this could be secondary to more nerve irritation patient could potentially increase the gabapentin to 100 mg in the a.m. and still the 200 mg at night.  Patient is a refill we would refill it.  Encourage patient to discontinue the meloxicam with him not having any significant improvement.  Patient could do an occasional anti-inflammatory but also wanted him to avoid that significantly.  Discussed vitamin D supplementation.  Follow-up again in 4 weeks. Due to the traumatic aspect of this I do not feel too strongly about a bone density.  Would leave that up to patient's primary care if they feel like that is necessary or not

## 2020-12-10 NOTE — Telephone Encounter (Signed)
Left message with family member to call to set up colon

## 2020-12-11 ENCOUNTER — Other Ambulatory Visit: Payer: Self-pay

## 2020-12-11 DIAGNOSIS — Z86010 Personal history of colonic polyps: Secondary | ICD-10-CM

## 2020-12-11 DIAGNOSIS — K635 Polyp of colon: Secondary | ICD-10-CM

## 2020-12-11 NOTE — Telephone Encounter (Signed)
The pt has been scheduled for colon on 01/22/21 at 915 am at [facility] with Dr Rush Landmark  Covid test on 3/7.  The pt has been advised and instructed.  Per pt the information has been sent to the pt My Chart.  He will call with any questions or concerns.

## 2020-12-13 ENCOUNTER — Other Ambulatory Visit: Payer: Self-pay | Admitting: Family Medicine

## 2020-12-15 ENCOUNTER — Telehealth: Payer: Self-pay | Admitting: Family Medicine

## 2020-12-15 NOTE — Telephone Encounter (Signed)
Patient called stating that [facility] Physical Therapy office is not able to see him until the middle of March. He asked if there was another Physical Therapy office that he could go to that might be close to him [facility]) that we could send the referral to.  Please advise.

## 2021-01-07 ENCOUNTER — Other Ambulatory Visit: Payer: Self-pay

## 2021-01-07 MED ORDER — MELOXICAM 7.5 MG PO TABS: 7.5000 mg | ORAL_TABLET | Freq: Every day | ORAL | 0 refills | Status: DC

## 2021-01-19 ENCOUNTER — Other Ambulatory Visit (HOSPITAL_COMMUNITY)
Admission: RE | Admit: 2021-01-19 | Discharge: 2021-01-19 | Disposition: A | Discharge status: Home / Self Care | Payer: Medicare HMO | Source: Ambulatory Visit | Attending: Gastroenterology | Admitting: Gastroenterology

## 2021-01-19 DIAGNOSIS — Z20822 Contact with and (suspected) exposure to covid-19: Secondary | ICD-10-CM | POA: Diagnosis not present

## 2021-01-19 DIAGNOSIS — Z01812 Encounter for preprocedural laboratory examination: Secondary | ICD-10-CM | POA: Diagnosis present

## 2021-01-19 LAB — SARS CORONAVIRUS 2 (TAT 6-24 HRS): SARS Coronavirus 2: NEGATIVE

## 2021-01-20 ENCOUNTER — Telehealth: Payer: Self-pay | Admitting: Gastroenterology

## 2021-01-20 NOTE — Telephone Encounter (Signed)
Pt is requesting a call back from a nurse to review the instructions prior to his procedure.

## 2021-01-20 NOTE — Telephone Encounter (Signed)
The pt states his instructions disappeared from My Chart and would like them resent. I have resent and verified with the pt that he did receive them.  No further questions.

## 2021-01-21 NOTE — Progress Notes (Signed)
Patient denies shortness of breath, fever, cough or chest pain.  PCP - Rubye Beach, DO Cardiologist - n/a  Chest x-ray - 07/16/20 (1V) EKG - 07/18/20 Stress Test - >10 yrs ago ECHO - n/a Cardiac Cath - n/a  Fasting Blood Sugar - 80-120s Checks Blood Sugar 3 times a day  . Do not take Jardiance today or the morning of surgery.  Do not take metformin on day of surgery.  . THE NIGHT BEFORE SURGERY, take 20 Units of Novolin Insulin       . If your blood sugar is less than 70 mg/dL, you will need to treat for low blood sugar: o Treat a low blood sugar (less than 70 mg/dL) with  cup of clear juice (cranberry or apple), 4 glucose tablets, OR glucose gel. o Recheck blood sugar in 15 minutes after treatment (to make sure it is greater than 70 mg/dL). If your blood sugar is not greater than 70 mg/dL on recheck, call [phone] for further instructions.  STOP now taking any Aspirin (unless otherwise instructed by your surgeon), Aleve, Naproxen, Ibuprofen, Motrin, Advil, Goody's, BC's, all herbal medications, fish oil, and all vitamins.   Coronavirus Screening Covid test on 01/19/21 was negative.  Patient verbalized understanding of instructions that were given via phone.

## 2021-01-22 ENCOUNTER — Encounter (HOSPITAL_COMMUNITY): Payer: Self-pay | Admitting: Gastroenterology

## 2021-01-22 ENCOUNTER — Ambulatory Visit (HOSPITAL_COMMUNITY)
Admission: RE | Admit: 2021-01-22 | Discharge: 2021-01-22 | Disposition: A | Discharge status: Home / Self Care | Payer: Medicare HMO | Attending: Gastroenterology | Admitting: Gastroenterology

## 2021-01-22 ENCOUNTER — Ambulatory Visit (HOSPITAL_COMMUNITY): Payer: Medicare HMO | Admitting: Certified Registered"

## 2021-01-22 ENCOUNTER — Other Ambulatory Visit: Payer: Self-pay

## 2021-01-22 ENCOUNTER — Encounter (HOSPITAL_COMMUNITY)
Admission: RE | Disposition: A | Discharge status: Home / Self Care | Payer: Self-pay | Source: Home / Self Care | Attending: Gastroenterology

## 2021-01-22 DIAGNOSIS — D123 Benign neoplasm of transverse colon: Secondary | ICD-10-CM | POA: Insufficient documentation

## 2021-01-22 DIAGNOSIS — Z794 Long term (current) use of insulin: Secondary | ICD-10-CM | POA: Diagnosis not present

## 2021-01-22 DIAGNOSIS — D122 Benign neoplasm of ascending colon: Secondary | ICD-10-CM | POA: Diagnosis not present

## 2021-01-22 DIAGNOSIS — Z8719 Personal history of other diseases of the digestive system: Secondary | ICD-10-CM | POA: Insufficient documentation

## 2021-01-22 DIAGNOSIS — K644 Residual hemorrhoidal skin tags: Secondary | ICD-10-CM | POA: Insufficient documentation

## 2021-01-22 DIAGNOSIS — D124 Benign neoplasm of descending colon: Secondary | ICD-10-CM | POA: Diagnosis not present

## 2021-01-22 DIAGNOSIS — K635 Polyp of colon: Secondary | ICD-10-CM

## 2021-01-22 DIAGNOSIS — Z833 Family history of diabetes mellitus: Secondary | ICD-10-CM | POA: Insufficient documentation

## 2021-01-22 DIAGNOSIS — Z888 Allergy status to other drugs, medicaments and biological substances status: Secondary | ICD-10-CM | POA: Diagnosis not present

## 2021-01-22 DIAGNOSIS — Z8249 Family history of ischemic heart disease and other diseases of the circulatory system: Secondary | ICD-10-CM | POA: Diagnosis not present

## 2021-01-22 DIAGNOSIS — Z09 Encounter for follow-up examination after completed treatment for conditions other than malignant neoplasm: Secondary | ICD-10-CM | POA: Diagnosis present

## 2021-01-22 DIAGNOSIS — K5730 Diverticulosis of large intestine without perforation or abscess without bleeding: Secondary | ICD-10-CM | POA: Insufficient documentation

## 2021-01-22 DIAGNOSIS — Z87891 Personal history of nicotine dependence: Secondary | ICD-10-CM | POA: Diagnosis not present

## 2021-01-22 DIAGNOSIS — Z86010 Personal history of colonic polyps: Secondary | ICD-10-CM

## 2021-01-22 DIAGNOSIS — K641 Second degree hemorrhoids: Secondary | ICD-10-CM | POA: Diagnosis not present

## 2021-01-22 HISTORY — PX: COLONOSCOPY WITH PROPOFOL: SHX5780

## 2021-01-22 HISTORY — PX: POLYPECTOMY: SHX5525

## 2021-01-22 HISTORY — PX: BIOPSY: SHX5522

## 2021-01-22 LAB — GLUCOSE, CAPILLARY
Glucose-Capillary: 128 mg/dL — ABNORMAL HIGH (ref 70–99)
Glucose-Capillary: 132 mg/dL — ABNORMAL HIGH (ref 70–99)

## 2021-01-22 SURGERY — COLONOSCOPY WITH PROPOFOL
Anesthesia: Monitor Anesthesia Care

## 2021-01-22 MED ORDER — LACTATED RINGERS IV SOLN: INTRAVENOUS | Status: DC

## 2021-01-22 MED ORDER — LIDOCAINE 2% (20 MG/ML) 5 ML SYRINGE
INTRAMUSCULAR | Status: DC | PRN
  Administered 2021-01-22: 100 mg via INTRAVENOUS

## 2021-01-22 MED ORDER — PROPOFOL 500 MG/50ML IV EMUL
INTRAVENOUS | Status: DC | PRN
  Administered 2021-01-22: 100 ug/kg/min via INTRAVENOUS

## 2021-01-22 MED ORDER — SODIUM CHLORIDE 0.9 % IV SOLN
INTRAVENOUS | Status: DC
Start: 2021-01-22 — End: 2021-01-22

## 2021-01-22 MED ORDER — ONDANSETRON HCL 4 MG/2ML IJ SOLN
INTRAMUSCULAR | Status: DC | PRN
  Administered 2021-01-22: 4 mg via INTRAVENOUS

## 2021-01-22 SURGICAL SUPPLY — 21 items

## 2021-01-22 NOTE — Anesthesia Postprocedure Evaluation (Signed)
Anesthesia Post Note  Patient: Jonathan Shelton  Procedure(s) Performed: COLONOSCOPY WITH PROPOFOL (N/A ) BIOPSY POLYPECTOMY     Patient location during evaluation: PACU Anesthesia Type: MAC Level of consciousness: awake and alert Pain management: pain level controlled Vital Signs Assessment: post-procedure vital signs reviewed and stable Respiratory status: spontaneous breathing and respiratory function stable Cardiovascular status: stable Postop Assessment: no apparent nausea or vomiting Anesthetic complications: no   No complications documented.  Last Vitals:  Vitals:   01/22/21 1055 01/22/21 1110  BP: 134/74 138/78  Pulse: 84 71  Resp: 14 16  Temp:  36.7 C  SpO2: 98% 99%    Last Pain:  Vitals:   01/22/21 1110  TempSrc:   PainSc: 0-No pain                 Merlinda Frederick

## 2021-01-22 NOTE — Op Note (Addendum)
[facility] Patient Name: Jonathan Shelton Procedure Date : 01/22/2021 MRN: [id number] Attending MD: Justice Britain , MD Date of Birth: 02/16/1951 CSN: [id number] Age: 70 Admit Type: Outpatient Procedure:                Colonoscopy Indications:              Surveillance: Personal history of piecemeal removal                            of adenoma on last colonoscopy (less than 1 year                            ago) which had previously been incomplete resection Providers:                Justice Britain, MD, Jeanella Cara, RN,                            Cletis Athens, Technician Referring MD:             Jonathan Shelton, Jonathan Raeanne Gathers MD, MD Medicines:                Monitored Anesthesia Care Complications:            No immediate complications. Estimated Blood Loss:     Estimated blood loss was minimal. Procedure:                Pre-Anesthesia Assessment:                           - Prior to the procedure, a History and Physical                            was performed, and patient medications and                            allergies were reviewed. The patient's tolerance of                            previous anesthesia was also reviewed. The risks                            and benefits of the procedure and the sedation                            options and risks were discussed with the patient.                            All questions were answered, and informed consent                            was obtained. Prior Anticoagulants: The patient has                            taken no previous anticoagulant or antiplatelet  agents except for aspirin. ASA Grade Assessment:                            III - A patient with severe systemic disease. After                            reviewing the risks and benefits, the patient was                            deemed in satisfactory condition to undergo the                             procedure.                           After obtaining informed consent, the colonoscope                            was passed under direct vision. Throughout the                            procedure, the patient's blood pressure, pulse, and                            oxygen saturations were monitored continuously. The                            CF-HQ190L ([id number]) Olympus colonoscope was                            introduced through the anus and advanced to the 5                            cm into the ileum. The colonoscopy was performed                            without difficulty. The patient tolerated the                            procedure. The quality of the bowel preparation was                            adequate. The terminal ileum, ileocecal valve,                            appendiceal orifice, and rectum were photographed. Scope In: 9:52:14 AM Scope Out: 10:30:18 AM Scope Withdrawal Time: 0 hours 31 minutes 18 seconds  Total Procedure Duration: 0 hours 38 minutes 4 seconds  Findings:      The digital rectal exam findings include hemorrhoids. Pertinent       negatives include no palpable rectal lesions.      A large amount of semi-liquid stool was found in the entire colon,       interfering with visualization. Lavage of the area was performed using  copious amounts, resulting in clearance with adequate visualization.      The terminal ileum and ileocecal valve appeared normal.      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 Overt adenomatous       tissue was not present. There were a few areas of likely clip artifact       with mild polypoid appearance noted. These areas were completely removed       with a cold snare for histology purposes.      Three sessile polyps were found in the descending colon, transverse       colon and ascending colon. The polyps were 3 to 5 mm in size. These       polyps were removed with a cold snare. Resection and retrieval  were       complete.      Multiple small-mouthed diverticula were found in the recto-sigmoid colon       and sigmoid colon.      Non-bleeding non-thrombosed external and internal hemorrhoids were found       during retroflexion, during perianal exam and during digital exam. The       hemorrhoids were Grade II (internal hemorrhoids that prolapse but reduce       spontaneously). Impression:               - Hemorrhoids found on digital rectal exam.                           - Stool in the entire examined colon - lavaged                            copiously with adequate visualization.                           - The examined portion of the ileum was normal.                           - Post mucosectomy scar in the cecum - small areas                            of likely clip artifact and these were resected off                            to ensure complete resection of previous large                            incomplete resection.                           - Three 3 to 5 mm polyps in the descending colon,                            in the transverse colon and in the ascending colon,                            removed with a cold snare. Resected and retrieved.                           -  Diverticulosis in the recto-sigmoid colon and in                            the sigmoid colon.                           - Non-bleeding non-thrombosed external and internal                            hemorrhoids. Recommendation:           - The patient will be observed post-procedure,                            until all discharge criteria are met.                           - Discharge patient to home.                           - Patient has a contact number available for                            emergencies. The signs and symptoms of potential                            delayed complications were discussed with the                            patient. Return to normal activities tomorrow.                             Written discharge instructions were provided to the                            patient.                           - High fiber diet.                           - Continue present medications.                           - Await pathology results.                           - Repeat colonoscopy in likely 3 years for                            surveillance if no significant adenomatous tissue                            is found on the pathology from cecal scar site. If                            some is found  then a 2-year follow up is reasonable.                           - The findings and recommendations were discussed                            with the patient.                           - The findings and recommendations were discussed                            with the patient's family. Procedure Code(s):        --- Professional ---                           [phone], Colonoscopy, flexible; with removal of                            tumor(s), polyp(s), or other lesion(s) by snare                            technique Diagnosis Code(s):        --- Professional ---                           K64.1, Second degree hemorrhoids                           Z98.890, Other specified postprocedural states                           K63.5, Polyp of colon                           Z09, Encounter for follow-up examination after                            completed treatment for conditions other than                            malignant neoplasm                           Z86.010, Personal history of colonic polyps                           K57.30, Diverticulosis of large intestine without                            perforation or abscess without bleeding CPT copyright 2019 American Medical Association. All rights reserved. The codes documented in this report are preliminary and upon coder review may  be revised to meet current compliance requirements. Justice Britain, MD 01/22/2021 10:51:56  AM Number of Addenda: 0

## 2021-01-22 NOTE — Transfer of Care (Signed)
Immediate Anesthesia Transfer of Care Note  Patient: Jonathan Shelton  Procedure(s) Performed: COLONOSCOPY WITH PROPOFOL (N/A ) BIOPSY POLYPECTOMY  Patient Location: PACU  Anesthesia Type:MAC  Level of Consciousness: drowsy and patient cooperative  Airway & Oxygen Therapy: Patient Spontanous Breathing and Patient connected to face mask oxygen  Post-op Assessment: Report given to RN and Post -op Vital signs reviewed and stable  Post vital signs: Reviewed and stable  Last Vitals:  Vitals Value Taken Time  BP 118/80 01/22/21 1037  Temp    Pulse 75 01/22/21 1041  Resp 15 01/22/21 1041  SpO2 99 % 01/22/21 1041  Vitals shown include unvalidated device data.  Last Pain:  Vitals:   01/22/21 0841  TempSrc: Temporal  PainSc: 7          Complications: No complications documented.

## 2021-01-22 NOTE — Anesthesia Preprocedure Evaluation (Addendum)
Anesthesia Evaluation  Patient identified by MRN, date of birth, ID band Patient awake    Reviewed: Allergy & Precautions, H&P , NPO status , Patient's Chart, lab work & pertinent test results  Airway Mallampati: II  TM Distance: >3 FB Neck ROM: Full    Dental no notable dental hx. (+) Teeth Intact, Dental Advisory Given   Pulmonary neg pulmonary ROS, former smoker,    Pulmonary exam normal breath sounds clear to auscultation       Cardiovascular Exercise Tolerance: Good hypertension,  Rhythm:Regular Rate:Normal  EKG reviewed   Neuro/Psych negative neurological ROS  negative psych ROS   GI/Hepatic Neg liver ROS, GERD  ,  Endo/Other  diabetes, Insulin Dependent, Oral Hypoglycemic Agents  Renal/GU Renal disease  negative genitourinary   Musculoskeletal  (+) Arthritis ,   Abdominal   Peds  Hematology negative hematology ROS (+)   Anesthesia Other Findings Painful right shoulder  Reproductive/Obstetrics negative OB ROS                            Anesthesia Physical  Anesthesia Plan  ASA: III  Anesthesia Plan: MAC   Post-op Pain Management:    Induction: Intravenous  PONV Risk Score and Plan: 1 and Propofol infusion  Airway Management Planned: Simple Face Mask  Additional Equipment:   Intra-op Plan:   Post-operative Plan:   Informed Consent: I have reviewed the patients History and Physical, chart, labs and discussed the procedure including the risks, benefits and alternatives for the proposed anesthesia with the patient or authorized representative who has indicated his/her understanding and acceptance.     Dental advisory given  Plan Discussed with: CRNA and Anesthesiologist  Anesthesia Plan Comments:         Anesthesia Quick Evaluation

## 2021-01-22 NOTE — Anesthesia Procedure Notes (Signed)
Procedure Name: MAC Date/Time: 01/22/2021 9:34 AM Performed by: Georgia Duff, CRNA Pre-anesthesia Checklist: Patient identified, Emergency Drugs available, Suction available and Patient being monitored Oxygen Delivery Method: Simple face mask Induction Type: IV induction Placement Confirmation: positive ETCO2 Dental Injury: Teeth and Oropharynx as per pre-operative assessment

## 2021-01-22 NOTE — H&P (Signed)
GASTROENTEROLOGY PROCEDURE H&P NOTE   Primary Care Physician: [name], DO  HPI: Jonathan Shelton is a 70 y.o. male who presents for Colonoscopy for follow up of TA s/p piecemeal resection in 2021 (previous attempt at resection was not complete prior to my attempt in 2021).  Past Medical History:  Diagnosis Date  . Allergy   . BPH (benign prostatic hyperplasia)   . Colon polyps   . Diabetes ([place])   . Diverticulosis   . Gallstones   . GERD (gastroesophageal reflux disease)   . HOH (hard of hearing)   . Kidney stones   . Renal disorder   . Wears glasses    Past Surgical History:  Procedure Laterality Date  . CARPAL TUNNEL RELEASE    . CHOLECYSTECTOMY    . COLONOSCOPY WITH PROPOFOL N/A 02/06/2020   Procedure: COLONOSCOPY WITH PROPOFOL;  Surgeon: Lin Landsman, MD;  Location: [facility] ENDOSCOPY;  Service: Gastroenterology;  Laterality: N/A;  . COLONOSCOPY WITH PROPOFOL N/A 03/31/2020   Procedure: COLONOSCOPY WITH PROPOFOL;  Surgeon: Rush Landmark Telford Nab., MD;  Location: [place];  Service: Gastroenterology;  Laterality: N/A;  . ENDOSCOPIC MUCOSAL RESECTION N/A 03/31/2020   Procedure: ENDOSCOPIC MUCOSAL RESECTION;  Surgeon: Rush Landmark Telford Nab., MD;  Location: [place];  Service: Gastroenterology;  Laterality: N/A;  . HEMOSTASIS CLIP PLACEMENT  03/31/2020   Procedure: HEMOSTASIS CLIP PLACEMENT;  Surgeon: Irving Copas., MD;  Location: [place];  Service: Gastroenterology;;  . HERNIA REPAIR    . IR RADIOLOGIST EVAL & MGMT  11/28/2020  . POLYPECTOMY  03/31/2020   Procedure: POLYPECTOMY;  Surgeon: Mansouraty, Telford Nab., MD;  Location: [place];  Service: Gastroenterology;;  . SMALL INTESTINE SURGERY    . SUBMUCOSAL LIFTING INJECTION  03/31/2020   Procedure: SUBMUCOSAL LIFTING INJECTION;  Surgeon: Rush Landmark Telford Nab., MD;  Location: [place];  Service: Gastroenterology;;   Current Facility-Administered Medications  Medication  Dose Route Frequency Provider Last Rate Last Admin  . lactated ringers infusion   Intravenous Continuous Mansouraty, Telford Nab., MD 10 mL/hr at 01/22/21 0854 New Bag at 01/22/21 5361   Allergies  Allergen Reactions  . Testosterone Rash    patch   Family History  Problem Relation Age of Onset  . Heart disease Mother 27  . Heart disease Father   . Stroke Father 58  . Diabetes Father   . Heart attack Father   . Liver disease Neg Hx   . Colon cancer Neg Hx   . Esophageal cancer Neg Hx   . Pancreatic cancer Neg Hx   . Stomach cancer Neg Hx   . Inflammatory bowel disease Neg Hx   . Rectal cancer Neg Hx    Social History   Socioeconomic History  . Marital status: Married    Spouse name: Not on file  . Number of children: Not on file  . Years of education: Graduate Degree  . Highest education level: Master's degree (e.g., MA, MS, MEng, MEd, MSW, MBA)  Occupational History  . Not on file  Tobacco Use  . Smoking status: Former Smoker    Types: Cigarettes    Quit date: 1970    Years since quitting: 52.2  . Smokeless tobacco: Former Systems developer    Types: Snuff  Vaping Use  . Vaping Use: Never used  Substance and Sexual Activity  . Alcohol use: Not Currently  . Drug use: Not Currently    Comment: past  . Sexual activity: Not on file  Other Topics Concern  .  Not on file  Social History Narrative  . Not on file   Social Determinants of Health   Financial Resource Strain: Not on file  Food Insecurity: Not on file  Transportation Needs: Not on file  Physical Activity: Not on file  Stress: Not on file  Social Connections: Not on file  Intimate Partner Violence: Not on file    Physical Exam: Vital signs in last 24 hours: Temp:  [97.7 F (36.5 C)] 97.7 F (36.5 C) (03/10 0841) Pulse Rate:  [84] 84 (03/10 0841) Resp:  [20] 20 (03/10 0841) BP: (151)/(78) 151/78 (03/10 0841) SpO2:  [95 %] 95 % (03/10 0841) Weight:  [104.3 kg] 104.3 kg (03/10 0841)   GEN: NAD EYE: Sclerae  anicteric ENT: MMM CV: Non-tachycardic GI: Soft, NT/ND NEURO:  Alert & Oriented x 3  Lab Results: No results for input(s): WBC, HGB, HCT, PLT in the last 72 hours. BMET No results for input(s): NA, K, CL, CO2, GLUCOSE, BUN, CREATININE, CALCIUM in the last 72 hours. LFT No results for input(s): PROT, ALBUMIN, AST, ALT, ALKPHOS, BILITOT, BILIDIR, IBILI in the last 72 hours. PT/INR No results for input(s): LABPROT, INR in the last 72 hours.   Impression / Plan: This is a 70 y.o.male who presents for Colonoscopy for follow up of TA s/p piecemeal resection in 2021 (previous attempt at resection was not complete prior to my attempt in 2021).  The risks and benefits of endoscopic evaluation were discussed with the patient; these include but are not limited to the risk of perforation, infection, bleeding, missed lesions, lack of diagnosis, severe illness requiring hospitalization, as well as anesthesia and sedation related illnesses.  The patient is agreeable to proceed.    Justice Britain, MD [facility] Office # [phone]

## 2021-01-23 LAB — SURGICAL PATHOLOGY

## 2021-01-25 ENCOUNTER — Encounter (HOSPITAL_COMMUNITY): Payer: Self-pay | Admitting: Gastroenterology

## 2021-01-26 ENCOUNTER — Other Ambulatory Visit (HOSPITAL_COMMUNITY): Payer: Self-pay | Admitting: Interventional Radiology

## 2021-01-26 DIAGNOSIS — S32010A Wedge compression fracture of first lumbar vertebra, initial encounter for closed fracture: Secondary | ICD-10-CM

## 2021-01-27 ENCOUNTER — Encounter: Payer: Self-pay | Admitting: Gastroenterology

## 2021-02-04 ENCOUNTER — Other Ambulatory Visit: Payer: Self-pay | Admitting: Radiology

## 2021-02-05 ENCOUNTER — Other Ambulatory Visit: Payer: Self-pay

## 2021-02-05 ENCOUNTER — Ambulatory Visit (HOSPITAL_COMMUNITY): Admission: RE | Admit: 2021-02-05 | Payer: Medicare HMO | Source: Ambulatory Visit

## 2021-02-05 ENCOUNTER — Ambulatory Visit (HOSPITAL_COMMUNITY)
Admission: RE | Admit: 2021-02-05 | Discharge: 2021-02-05 | Disposition: A | Discharge status: Home / Self Care | Payer: Medicare HMO | Source: Ambulatory Visit | Attending: Interventional Radiology | Admitting: Interventional Radiology

## 2021-02-05 DIAGNOSIS — M4856XA Collapsed vertebra, not elsewhere classified, lumbar region, initial encounter for fracture: Secondary | ICD-10-CM | POA: Insufficient documentation

## 2021-02-05 DIAGNOSIS — Z794 Long term (current) use of insulin: Secondary | ICD-10-CM | POA: Insufficient documentation

## 2021-02-05 DIAGNOSIS — Z7982 Long term (current) use of aspirin: Secondary | ICD-10-CM | POA: Diagnosis not present

## 2021-02-05 DIAGNOSIS — Z79899 Other long term (current) drug therapy: Secondary | ICD-10-CM | POA: Insufficient documentation

## 2021-02-05 DIAGNOSIS — Z7984 Long term (current) use of oral hypoglycemic drugs: Secondary | ICD-10-CM | POA: Insufficient documentation

## 2021-02-05 DIAGNOSIS — M549 Dorsalgia, unspecified: Secondary | ICD-10-CM | POA: Diagnosis not present

## 2021-02-05 DIAGNOSIS — W19XXXA Unspecified fall, initial encounter: Secondary | ICD-10-CM | POA: Diagnosis not present

## 2021-02-05 DIAGNOSIS — S32010A Wedge compression fracture of first lumbar vertebra, initial encounter for closed fracture: Secondary | ICD-10-CM

## 2021-02-05 HISTORY — PX: IR KYPHO LUMBAR INC FX REDUCE BONE BX UNI/BIL CANNULATION INC/IMAGING: IMG5519

## 2021-02-05 LAB — CBC
HCT: 46.3 % (ref 39.0–52.0)
Hemoglobin: 15.4 g/dL (ref 13.0–17.0)
MCH: 30.6 pg (ref 26.0–34.0)
MCHC: 33.3 g/dL (ref 30.0–36.0)
MCV: 91.9 fL (ref 80.0–100.0)
Platelets: 214 10*3/uL (ref 150–400)
RBC: 5.04 MIL/uL (ref 4.22–5.81)
RDW: 13.8 % (ref 11.5–15.5)
WBC: 5.7 10*3/uL (ref 4.0–10.5)
nRBC: 0 % (ref 0.0–0.2)

## 2021-02-05 LAB — GLUCOSE, CAPILLARY: Glucose-Capillary: 125 mg/dL — ABNORMAL HIGH (ref 70–99)

## 2021-02-05 LAB — BASIC METABOLIC PANEL
Anion gap: 9 (ref 5–15)
BUN: 17 mg/dL (ref 8–23)
CO2: 24 mmol/L (ref 22–32)
Calcium: 9.1 mg/dL (ref 8.9–10.3)
Chloride: 106 mmol/L (ref 98–111)
Creatinine, Ser: 0.76 mg/dL (ref 0.61–1.24)
GFR, Estimated: >60 mL/min (ref >60)
Glucose, Bld: 126 mg/dL — ABNORMAL HIGH (ref 70–99)
Potassium: 4.2 mmol/L (ref 3.5–5.1)
Sodium: 139 mmol/L (ref 135–145)

## 2021-02-05 LAB — PROTIME-INR
INR: 1 (ref 0.8–1.2)
Prothrombin Time: 13 seconds (ref 11.4–15.2)

## 2021-02-05 MED ORDER — CEFAZOLIN SODIUM-DEXTROSE 2-4 GM/100ML-% IV SOLN
INTRAVENOUS | Status: AC
  Administered 2021-02-05: 2 g via INTRAVENOUS
  Filled 2021-02-05: qty 100

## 2021-02-05 MED ORDER — CEFAZOLIN SODIUM-DEXTROSE 2-4 GM/100ML-% IV SOLN: 2.0000 g | INTRAVENOUS | Status: AC

## 2021-02-05 MED ORDER — IOHEXOL 300 MG/ML  SOLN
50.0000 mL | Freq: Once | INTRAMUSCULAR | Status: AC | PRN
  Administered 2021-02-05: 1 mL

## 2021-02-05 MED ORDER — FENTANYL CITRATE (PF) 100 MCG/2ML IJ SOLN
INTRAMUSCULAR | Status: AC | PRN
  Administered 2021-02-05: 50 ug via INTRAVENOUS
  Administered 2021-02-05 (×2): 25 ug via INTRAVENOUS

## 2021-02-05 MED ORDER — FENTANYL CITRATE (PF) 100 MCG/2ML IJ SOLN
INTRAMUSCULAR | Status: AC
  Filled 2021-02-05: qty 2

## 2021-02-05 MED ORDER — BUPIVACAINE HCL (PF) 0.5 % IJ SOLN
INTRAMUSCULAR | Status: AC
  Filled 2021-02-05: qty 30

## 2021-02-05 MED ORDER — SODIUM CHLORIDE 0.9 % IV SOLN: INTRAVENOUS | Status: DC

## 2021-02-05 MED ORDER — BUPIVACAINE HCL (PF) 0.25 % IJ SOLN
INTRAMUSCULAR | Status: AC | PRN
Start: 2021-02-05 — End: 2021-02-05
  Administered 2021-02-05: 30 mL

## 2021-02-05 MED ORDER — MIDAZOLAM HCL 2 MG/2ML IJ SOLN
INTRAMUSCULAR | Status: AC
  Filled 2021-02-05: qty 2

## 2021-02-05 MED ORDER — MIDAZOLAM HCL 2 MG/2ML IJ SOLN
INTRAMUSCULAR | Status: AC | PRN
  Administered 2021-02-05 (×2): 1 mg via INTRAVENOUS

## 2021-02-05 NOTE — Discharge Instructions (Signed)
Balloon Kyphoplasty, Care After This sheet gives you information about how to care for yourself after your procedure. Your health care provider may also give you more specific instructions. If you have problems or questions, contact your health care provider. What can I expect after the procedure? After the procedure, it is common to have back pain. Follow these instructions at home: Medicines  Take over-the-counter and prescription medicines only as told by your health care provider.  Ask your health care provider if the medicine prescribed to you: ? Requires you to avoid driving or using machinery. ? Can cause constipation. You may need to take these actions to prevent or treat constipation:  Drink enough fluid to keep your urine pale yellow.  Take over-the-counter or prescription medicines.  Eat foods that are high in fiber, such as beans, whole grains, and fresh fruits and vegetables.  Limit foods that are high in fat and processed sugars, such as fried or sweet foods. Puncture site care  Follow instructions from your health care provider about how to take care of your puncture site. Make sure you: ? Wash your hands with soap and water for at least 20 seconds before and after you change your bandage (dressing). If soap and water are not available, use hand sanitizer. ? Remove your dressing as told by your health care provider. In 24 hours. You may shower after. ? Leave skin glue or adhesive strips in place. These skin closures may need to be in place for 2 weeks or longer. If adhesive strip edges start to loosen and curl up, you may trim the loose edges. Do not remove adhesive strips completely unless your health care provider tells you to do that.  Check your puncture site every day for signs of infection. Check for: ? Redness, swelling, or pain. ? Fluid or blood. ? Warmth. ? Pus or a bad smell.  Keep your dressing dry until your health care provider says that it can be  removed.   Managing pain, stiffness, and swelling If directed, put ice on the painful area. To do this:  Put ice in a plastic bag.  Place a towel between your skin and the bag.  Leave the ice on for 20 minutes, 2-3 times a day.  Remove the ice if your skin turns bright red. This is very important. If you cannot feel pain, heat, or cold, you have a greater risk of damage to the area.   Activity  Rest your back and avoid intense physical activity for as long as told by your health care provider.  Avoid bending, lifting, or twisting your back for as long as told by your health care provider.  Return to your normal activities as told by your health care provider. Ask your health care provider what activities are safe for you.  Do not lift anything that is heavier than 5 lb (2.2 kg). You may need to avoid heavy lifting for several weeks. 2-4 weeks General instructions  Do not use any products that contain nicotine or tobacco, such as cigarettes, e-cigarettes, and chewing tobacco. These can delay bone healing. If you need help quitting, ask your health care provider.  If you were given a sedative during the procedure, it can affect you for several hours. Do not drive or operate machinery until your health care provider says that it is safe.  Keep all follow-up visits. This is important. Contact a health care provider if:  You have a fever or chills.  You have redness,  swelling, or pain at the site of your puncture.  You have fluid, blood, or pus coming from the puncture site.  You have pain that gets worse or does not get better with medicine.  You develop numbness or weakness in any part of your body. Get help right away if:  You have chest pain.  You have difficulty breathing.  You have weakness, numbness, or tingling in your legs.  You cannot control your bladder or bowel movements (incontinence).  You suddenly become weak or numb on one side of your body.  You become  very confused.  You have trouble speaking or understanding, or both. These symptoms may represent a serious problem that is an emergency. Do not wait to see if the symptoms will go away. Get medical help right away. Call your local emergency services (911 in the [place] Do not drive yourself to the hospital. Summary  Follow instructions from your health care provider about how to take care of your puncture site.  Take over-the-counter and prescription medicines only as told by your health care provider.  Rest your back and avoid intense physical activity for as long as told by your health care provider.  Contact a health care provider if you have pain that gets worse or does not get better with medicine.  Keep all follow-up visits. This is important. This information is not intended to replace advice given to you by your health care provider. Make sure you discuss any questions you have with your health care provider. Document Revised: 02/20/2020 Document Reviewed: 02/20/2020 Elsevier Patient Education  2021 [employer].  Moderate Conscious Sedation, Adult, Care After This sheet gives you information about how to care for yourself after your procedure. Your health care provider may also give you more specific instructions. If you have problems or questions, contact your health care provider. What can I expect after the procedure? After the procedure, it is common to have:  Sleepiness for several hours.  Impaired judgment for several hours.  Difficulty with balance.  Vomiting if you eat too soon. Follow these instructions at home: For the time period you were told by your health care provider:  Rest.  Do not participate in activities where you could fall or become injured.  Do not drive or use machinery.  Do not drink alcohol.  Do not take sleeping pills or medicines that cause drowsiness.  Do not make important decisions or sign legal documents.  Do not take care of  children on your own.      Eating and drinking  Follow the diet recommended by your health care provider.  Drink enough fluid to keep your urine pale yellow.  If you vomit: ? Drink water, juice, or soup when you can drink without vomiting. ? Make sure you have little or no nausea before eating solid foods.   General instructions  Take over-the-counter and prescription medicines only as told by your health care provider.  Have a responsible adult stay with you for the time you are told. It is important to have someone help care for you until you are awake and alert.  Do not smoke.  Keep all follow-up visits as told by your health care provider. This is important. Contact a health care provider if:  You are still sleepy or having trouble with balance after 24 hours.  You feel light-headed.  You keep feeling nauseous or you keep vomiting.  You develop a rash.  You have a fever.  You have redness  or swelling around the IV site. Get help right away if:  You have trouble breathing.  You have new-onset confusion at home. Summary  After the procedure, it is common to feel sleepy, have impaired judgment, or feel nauseous if you eat too soon.  Rest after you get home. Know the things you should not do after the procedure.  Follow the diet recommended by your health care provider and drink enough fluid to keep your urine pale yellow.  Get help right away if you have trouble breathing or new-onset confusion at home. This information is not intended to replace advice given to you by your health care provider. Make sure you discuss any questions you have with your health care provider. Document Revised: 02/29/2020 Document Reviewed: 09/27/2019 Elsevier Patient Education  2021 [employer].

## 2021-02-05 NOTE — H&P (Signed)
Referring Physician(s): Shelton,Jonathan  Supervising Physician: Corrie Mckusick  Patient Status:  Jonathan Shelton  Chief Complaint: Back pain, L1 compression fracture   Subjective: Pt familiar to IR service from consultation with Dr. Earleen Newport on 11/28/20 to discuss treatment options for symptomatic L1 compression fracture sustained following a fall at home in January of this year . At that time he was deemed a candidate for vertebral body augmentation at that level but his pain was improving slightly with use of aleve/ meloxicam and gabapentin; however, he now continues to have persistent mid to lower back pain with radiation to both flanks and presents today for L1 verterbral body augmentation. He denies fever, CP, worsening dyspnea, abd pain,N/V or bleeding, LE paresthesias, bowel/bladder incontinence. He does have occ HA's/cough. Additional med hx as below.    Past Medical History:  Diagnosis Date  . Allergy   . BPH (benign prostatic hyperplasia)   . Colon polyps   . Diabetes ([place])   . Diverticulosis   . Gallstones   . GERD (gastroesophageal reflux disease)   . HOH (hard of hearing)   . Kidney stones   . Renal disorder   . Wears glasses    Past Surgical History:  Procedure Laterality Date  . BIOPSY  01/22/2021   Procedure: BIOPSY;  Surgeon: Rush Landmark Telford Nab., MD;  Location: [place];  Service: Gastroenterology;;  . CARPAL TUNNEL RELEASE    . CHOLECYSTECTOMY    . COLONOSCOPY WITH PROPOFOL N/A 02/06/2020   Procedure: COLONOSCOPY WITH PROPOFOL;  Surgeon: Lin Landsman, MD;  Location: [facility] ENDOSCOPY;  Service: Gastroenterology;  Laterality: N/A;  . COLONOSCOPY WITH PROPOFOL N/A 03/31/2020   Procedure: COLONOSCOPY WITH PROPOFOL;  Surgeon: Rush Landmark Telford Nab., MD;  Location: [place];  Service: Gastroenterology;  Laterality: N/A;  . COLONOSCOPY WITH PROPOFOL N/A 01/22/2021   Procedure: COLONOSCOPY WITH PROPOFOL;  Surgeon: Rush Landmark Telford Nab., MD;  Location: [place];  Service: Gastroenterology;  Laterality: N/A;  . ENDOSCOPIC MUCOSAL RESECTION N/A 03/31/2020   Procedure: ENDOSCOPIC MUCOSAL RESECTION;  Surgeon: Rush Landmark Telford Nab., MD;  Location: [place];  Service: Gastroenterology;  Laterality: N/A;  . HEMOSTASIS CLIP PLACEMENT  03/31/2020   Procedure: HEMOSTASIS CLIP PLACEMENT;  Surgeon: Irving Copas., MD;  Location: [place];  Service: Gastroenterology;;  . HERNIA REPAIR    . IR RADIOLOGIST EVAL & MGMT  11/28/2020  . POLYPECTOMY  03/31/2020   Procedure: POLYPECTOMY;  Surgeon: Mansouraty, Telford Nab., MD;  Location: [place];  Service: Gastroenterology;;  . POLYPECTOMY  01/22/2021   Procedure: POLYPECTOMY;  Surgeon: Irving Copas., MD;  Location: [place];  Service: Gastroenterology;;  . SMALL INTESTINE SURGERY    . SUBMUCOSAL LIFTING INJECTION  03/31/2020   Procedure: SUBMUCOSAL LIFTING INJECTION;  Surgeon: Rush Landmark Telford Nab., MD;  Location: [facility] ENDOSCOPY;  Service: Gastroenterology;;        Allergies: Testosterone  Medications: Prior to Admission medications   Medication Sig Start Date End Date Taking? Authorizing Provider  aspirin 81 MG EC tablet Chew 81 mg by mouth daily.    Yes [provider]  atorvastatin (LIPITOR) 10 MG tablet TAKE 1 TABLET (10 MG TOTAL) BY MOUTH DAILY AT 6 PM. Patient taking differently: Take 10 mg by mouth every other day. 11/14/20  Yes Karamalegos, Devonne Doughty, DO  finasteride (PROSCAR) 5 MG tablet Take 1 tablet (5 mg total) by mouth daily. 11/24/20  Yes Karamalegos, Devonne Doughty, DO  gabapentin (NEURONTIN) 100 MG capsule Take 2 capsules (200 mg total) by mouth at  bedtime. Patient taking differently: Take 200 mg by mouth at bedtime as needed (pain). 11/19/20  Yes Hulan Saas M, DO  JARDIANCE 25 MG TABS tablet TAKE 1 TABLET BY MOUTH EVERY DAY Patient taking differently: Take 25 mg by mouth daily. 09/29/20  Yes Karamalegos, Devonne Doughty, DO  meloxicam (MOBIC) 7.5  MG tablet Take 1 tablet (7.5 mg total) by mouth daily. Patient taking differently: Take 7.5 mg by mouth daily as needed for pain. 01/07/21  Yes Hulan Saas M, DO  metFORMIN (GLUCOPHAGE) 1000 MG tablet TAKE 1 TABLET (1,000 MG TOTAL) BY MOUTH 2 (TWO) TIMES DAILY WITH A MEAL. 08/23/20  Yes Karamalegos, Devonne Doughty, DO  naproxen sodium (ALEVE) 220 MG tablet Take 660 mg by mouth daily as needed (pain).   Yes [provider]  NOVOLIN N 100 UNIT/ML injection INJECT 0.4 MLS (40 UNITS TOTAL) INTO THE SKIN AT BEDTIME. MAY USE REDUCED DOSE AS ADVISED. Patient taking differently: Inject 40-45 Units into the skin at bedtime. 09/08/20  Yes Malfi, Lupita Raider, FNP  tamsulosin (FLOMAX) 0.4 MG CAPS capsule TAKE 2 CAPSULES BY MOUTH EVERY DAY Patient taking differently: Take 0.4 mg by mouth See admin instructions. Take 0.4 mg daily may take a second 0.4 mg dose as needed for urinary flow 09/25/20  Yes Stoioff, Ronda Fairly, MD  valsartan (DIOVAN) 80 MG tablet Take 1 tablet (80 mg total) by mouth daily. 04/01/20  Yes Karamalegos, Devonne Doughty, DO  glucose blood test strip Check blood sugar up to 4 times daily 07/24/20   Parks Ranger, Devonne Doughty, DO  Insulin Syringe-Needle U-100 31G X 1/4" 0.5 ML MISC Use syringe to inject insulin into skin daily. 11/24/20   Karamalegos, Devonne Doughty, DO  levalbuterol (XOPENEX HFA) 45 MCG/ACT inhaler Inhale 1-2 puffs into the lungs every 6 (six) hours as needed for wheezing. Patient not taking: Reported on 02/02/2021 07/10/20   [name], DO  Pramox-PE-Glycerin-Petrolatum (PREPARATION H) 1-0.25-14.4-15 % CREA Place 1 application rectally daily as needed (hemmorhoids).    [provider]  TRULICITY 1.5 LT/9.0ZE SOPN Inject 1.5 mg into the skin once a week. 11/24/20   Karamalegos, Devonne Doughty, DO  albuterol (VENTOLIN HFA) 108 (90 Base) MCG/ACT inhaler Inhale 1-2 puffs into the lungs every 4 (four) hours as needed for wheezing or shortness of breath. 07/10/20 07/10/20  Malfi,  Lupita Raider, FNP     Vital Signs: BP (!) 141/88   Pulse 91   Temp 98.1 F (36.7 C)   Ht 5\' 10"  (1.778 m)   Wt 229 lb (103.9 kg)   SpO2 96%   BMI 32.86 kg/m   Physical Exam awake/alert; chest- CTA bilat; heart- RRR; abd- soft,+BS,NT; mild- mod paravertebral tenderness L1 region; no LE edema  Imaging: No results found.  Labs:  CBC: Recent Labs    07/20/20 0540 07/21/20 0537 07/23/20 0440 08/12/20 0853  WBC 6.9 8.5 8.6 5.1  HGB 14.5 15.0 14.2 13.3  HCT 43.5 44.6 42.1 40.0  PLT 245 256 243 105*    COAGS: No results for input(s): INR, APTT in the last 8760 hours.  BMP: Recent Labs    07/20/20 0540 07/21/20 0537 07/23/20 0440 08/12/20 0853  NA 139 132* 136 139  K 4.8 4.2 4.2 3.9  CL 103 99 98 104  CO2 26 23 28 24   GLUCOSE 329* 287* 247* 81  BUN 33* 25* 28* 11  CALCIUM 8.5* 8.1* 8.4* 8.8  CREATININE 0.81 0.69 0.75 0.71  GFRNONAA >60 >60 >60 96  GFRAA >60 >  60 >60 112    LIVER FUNCTION TESTS: Recent Labs    07/19/20 0455 07/20/20 0540 07/21/20 0537 07/23/20 0440 08/12/20 0853  BILITOT 1.0 1.6* 1.6* 1.3* 0.5  AST 46* 40 41 24 14  ALT 50* 48* 61* 54* 15  ALKPHOS 52 53 54 53  --   PROT 6.4* 5.7* 5.7* 5.5* 6.2  ALBUMIN 2.7* 2.7* 2.6* 2.6*  --     Assessment and Plan: Pt familiar to IR service from consultation with Dr. Earleen Newport on 11/28/20 to discuss treatment options for symptomatic L1 compression fracture sustained following a fall at home in January of this year . At that time he was deemed a candidate for vertebral body augmentation at that level but his pain was improving slightly with use of aleve/ meloxicam and gabapentin; however, he now continues to have persistent mid to lower back pain with radiation to both flanks and presents today for L1 verterbral body augmentation. Risks and benefits of procedure were discussed with the patient including, but not limited to education regarding the natural healing process of compression fractures without  intervention, bleeding, infection, cement migration which may cause spinal cord damage, paralysis, pulmonary embolism or even death.  This interventional procedure involves the use of X-rays and because of the nature of the planned procedure, it is possible that we will have prolonged use of X-ray fluoroscopy.  Potential radiation risks to you include (but are not limited to) the following: - A slightly elevated risk for cancer  several years later in life. This risk is typically less than 0.5% percent. This risk is low in comparison to the normal incidence of human cancer, which is 33% for women and 50% for men according to the [place]. - Radiation induced injury can include skin redness, resembling a rash, tissue breakdown / ulcers and hair loss (which can be temporary or permanent).   The likelihood of either of these occurring depends on the difficulty of the procedure and whether you are sensitive to radiation due to previous procedures, disease, or genetic conditions.   IF your procedure requires a prolonged use of radiation, you will be notified and given written instructions for further action.  It is your responsibility to monitor the irradiated area for the 2 weeks following the procedure and to notify your physician if you are concerned that you have suffered a radiation induced injury.    All of the patient's questions were answered, patient is agreeable to proceed.  Consent signed and in chart.      Electronically Signed: D. Rowe Robert, PA-C 02/05/2021, 11:27 AM   I spent a total of 25 minutes at the the patient's bedside AND on the patient's hospital floor or unit, greater than 50% of which was counseling/coordinating care for L1 vertebral body augmentation

## 2021-02-05 NOTE — Procedures (Signed)
Interventional Radiology Procedure Note  Procedure:   L1 KP via bipedicular approach   Complications: None Recommendations:  - Ok to shower tomorrow - Do not submerge for 7 days - Routine wound care - Ice prn for local soreness - Follow up with Dr. Earleen Newport in [place] [facility] in 4-6 weeks   Signed,  Dulcy Fanny. Earleen Newport, DO

## 2021-02-06 ENCOUNTER — Telehealth: Payer: Self-pay | Admitting: Student

## 2021-02-06 NOTE — Telephone Encounter (Signed)
Patient called with questions regarding antibiotic s/p procedure in IR yesterday.  Educated patient that the antibiotic was given during the procedure and that nothing additional is required.  Answered all questions.   Brynda Greathouse, MS RD PA-C 12:12 PM

## 2021-02-12 DIAGNOSIS — E1169 Type 2 diabetes mellitus with other specified complication: Secondary | ICD-10-CM

## 2021-02-12 DIAGNOSIS — Z794 Long term (current) use of insulin: Secondary | ICD-10-CM

## 2021-02-13 MED ORDER — OZEMPIC (0.25 OR 0.5 MG/DOSE) 2 MG/1.5ML ~~LOC~~ SOPN: 0.2500 mg | PEN_INJECTOR | SUBCUTANEOUS | 1 refills | Status: DC

## 2021-02-17 ENCOUNTER — Other Ambulatory Visit: Payer: Self-pay | Admitting: Interventional Radiology

## 2021-02-17 DIAGNOSIS — S32000A Wedge compression fracture of unspecified lumbar vertebra, initial encounter for closed fracture: Secondary | ICD-10-CM

## 2021-02-21 ENCOUNTER — Other Ambulatory Visit: Payer: Self-pay | Admitting: Family Medicine

## 2021-02-21 DIAGNOSIS — E1169 Type 2 diabetes mellitus with other specified complication: Secondary | ICD-10-CM

## 2021-02-21 DIAGNOSIS — Z794 Long term (current) use of insulin: Secondary | ICD-10-CM

## 2021-02-21 NOTE — Telephone Encounter (Signed)
Requested Prescriptions  Pending Prescriptions Disp Refills  . metFORMIN (GLUCOPHAGE) 1000 MG tablet [Pharmacy Med Name: METFORMIN HCL 1,000 MG TABLET] 180 tablet 0    Sig: TAKE 1 TABLET (1,000 MG TOTAL) BY MOUTH 2 (TWO) TIMES DAILY WITH A MEAL.     Endocrinology:  Diabetes - Biguanides Passed - 02/21/2021  8:51 AM      Passed - Cr in normal range and within 360 days    Creat  Date Value Ref Range Status  08/12/2020 0.71 0.70 - 1.25 mg/dL Final    Comment:    For patients >62 years of age, the reference limit for Creatinine is approximately 13% higher for people identified as African-American. .    Creatinine, Ser  Date Value Ref Range Status  02/05/2021 0.76 0.61 - 1.24 mg/dL Final         Passed - HBA1C is between 0 and 7.9 and within 180 days    Hgb A1c MFr Bld  Date Value Ref Range Status  11/26/2020 7.0 (H) <5.7 % of total Hgb Final    Comment:    For someone without known diabetes, a hemoglobin A1c value of 6.5% or greater indicates that they may have  diabetes and this should be confirmed with a follow-up  test. . For someone with known diabetes, a value <7% indicates  that their diabetes is well controlled and a value  greater than or equal to 7% indicates suboptimal  control. A1c targets should be individualized based on  duration of diabetes, age, comorbid conditions, and  other considerations. . Currently, no consensus exists regarding use of hemoglobin A1c for diagnosis of diabetes for children. .          Passed - eGFR in normal range and within 360 days    GFR, Est African American  Date Value Ref Range Status  08/12/2020 112 > OR = 60 mL/min/1.57m Final   GFR, Est Non African American  Date Value Ref Range Status  08/12/2020 96 > OR = 60 mL/min/1.766mFinal   GFR, Estimated  Date Value Ref Range Status  02/05/2021 >60 >60 mL/min Final    Comment:    (NOTE) Calculated using the CKD-EPI Creatinine Equation (2021)          Passed - Valid  encounter within last 6 months    Recent Outpatient Visits          2 months ago Type 2 diabetes mellitus with other specified complication, with long-term current use of insulin [facility]  [place]   6 months ago Annual physical exam   [place]   6 months ago COS[place]DO   7 months [facility] NiLupita RaiderFNP   1 year ago Type 2 diabetes mellitus with other specified complication, [facility]  [facility] RangerAlDevonne DoughtyDO      Future Appointments            In 3 days [place] RangerAlDevonne DoughtyDO [facility] In 3 weeks [place], ScRonda FairlyMD [place] In 1 month KoRalene BatheMD AlNaytahwaus[place] In 1 UnadillaZaOlevia BowensDOWellingtonports Medicine

## 2021-02-24 ENCOUNTER — Other Ambulatory Visit: Payer: Self-pay

## 2021-02-24 ENCOUNTER — Encounter: Payer: Self-pay | Admitting: Family Medicine

## 2021-02-24 ENCOUNTER — Ambulatory Visit: Payer: Medicare HMO | Admitting: Family Medicine

## 2021-02-24 VITALS — BP 120/71 | HR 86 | Temp 97.7°F | Ht 70.0 in | Wt 234.2 lb

## 2021-02-24 DIAGNOSIS — M47816 Spondylosis without myelopathy or radiculopathy, lumbar region: Secondary | ICD-10-CM

## 2021-02-24 DIAGNOSIS — Z794 Long term (current) use of insulin: Secondary | ICD-10-CM

## 2021-02-24 DIAGNOSIS — E1169 Type 2 diabetes mellitus with other specified complication: Secondary | ICD-10-CM | POA: Diagnosis not present

## 2021-02-24 DIAGNOSIS — R4189 Other symptoms and signs involving cognitive functions and awareness: Secondary | ICD-10-CM

## 2021-02-24 DIAGNOSIS — Z8616 Personal history of COVID-19: Secondary | ICD-10-CM

## 2021-02-24 DIAGNOSIS — R296 Repeated falls: Secondary | ICD-10-CM | POA: Diagnosis not present

## 2021-02-24 DIAGNOSIS — R29898 Other symptoms and signs involving the musculoskeletal system: Secondary | ICD-10-CM | POA: Diagnosis not present

## 2021-02-24 DIAGNOSIS — R4789 Other speech disturbances: Secondary | ICD-10-CM

## 2021-02-24 LAB — POCT GLYCOSYLATED HEMOGLOBIN (HGB A1C): Hemoglobin A1C: 7 % — AB (ref 4.0–5.6)

## 2021-02-24 MED ORDER — GABAPENTIN 100 MG PO CAPS: 200.0000 mg | ORAL_CAPSULE | Freq: Every day | ORAL | 3 refills | Status: DC

## 2021-02-24 NOTE — Patient Instructions (Addendum)
Thank you for coming to the office today.  Recent Labs    07/17/20 0539 08/12/20 0853 11/26/20 0757  HGBA1C 7.6* 8.3* 7.0*   Try to reduce insulin dosing if possible, goal is fasting blood sugar < 150  May need increase Ozempic in future   Refilled Gabapentin  Referral to Neurology for further evaluation and management.  [facility] - Neurology Dept [place], [place] [address] Phone: [phone]   ---------------------------------  Leg cramps - G2 low calorie gatorade  - Try spoonful of yellow mustard to relieve leg cramps or try daily to prevent the problem  - OTC natural option is Hyland's Leg Cramps (Dissolving tablet) take as needed for muscle cramps    Please schedule a Follow-up Appointment to: Return in about 3 months (around 05/26/2021) for 3 month follow-up fasting lab only then 1 week later DM follow-up, Neuro.  If you have any other questions or concerns, please feel free to call the office or send a message through [place]. You may also schedule an earlier appointment if necessary.  Additionally, you may be receiving a survey about your experience at our office within a few days to 1 week by e-mail or mail. We value your feedback.  Nobie Putnam, DO [place]

## 2021-02-24 NOTE — Progress Notes (Signed)
Subjective:    Patient ID: Jonathan Shelton, male    DOB: Sep 16, 1951, 70 y.o.   MRN: [id number]  Jonathan Shelton is a 70 y.o. male presenting on 02/24/2021 for Diabetes   HPI   CHRONIC DM, Type 2: Today due for A1c Previously in 8 range due to recent COVID illness. CBGs: variable ranges, AM sugar lower in 100-150 range. In past he had results < 100 at times Meds:Novolin-N40units nightly, Ozempic0.5mg  weekly,Jardiance 25mg  daily, Metformin 1000mg  BID Reports good compliance. Tolerating well w/o side-effects Now he says Ozempic no longer curbing appetite wants to change to Trulicity He needs new insulin syringe orders Currently on ARB Weight has plateau or increased up 6 lbs in past few months DM Eye exam done by Dr Ellin Mayhew Denies hypoglycemia, polyuria, visual changes, numbness or tingling  Following COVID illness, he has had difficulty with speech, slower speech, voice is different, issues with balance and falls, and slower movement - They have concerns about possible stroke - He has adjusted his medications in attempt to see if that would help - He continues w/ PT regimen. He has followed Orthopedics, he had prior fall injury with back fracture, he has had Kyphoplasty. They recommended Neurology referral  Upcoming Ortho Dr Harlow Mares, had R shoulder bursitis steroid injection and R knee. Back Pain Takes Gabapentin 200mg  nightly needs refill Followed by Ortho   Left Leg cramping hamstring Worse night time if more active   Depression screen [facility] 2/9 03/18/2020 02/11/2020 10/08/2019  Decreased Interest 0 0 0  Down, Depressed, Hopeless 0 0 0  PHQ - 2 Score 0 0 0  Altered sleeping - - -  Tired, decreased energy - - -  Change in appetite - - -  Feeling bad or failure about yourself  - - -  Trouble concentrating - - -  Moving slowly or fidgety/restless - - -  Suicidal thoughts - - -  PHQ-9 Score - - -  Difficult doing work/chores - - -    Social History   Tobacco Use   . Smoking status: Former Smoker    Types: Cigarettes    Quit date: 1970    Years since quitting: 52.3  . Smokeless tobacco: Former Systems developer    Types: Snuff  Vaping Use  . Vaping Use: Never used  Substance Use Topics  . Alcohol use: Not Currently  . Drug use: Not Currently    Comment: past    Review of Systems Per HPI unless specifically indicated above     Objective:    BP 120/71 (BP Location: Left Arm, Patient Position: Sitting, Cuff Size: Normal)   Pulse 86   Temp 97.7 F (36.5 C) (Temporal)   Ht 5\' 10"  (1.778 m)   Wt 234 lb 3.2 oz (106.2 kg)   SpO2 97%   BMI 33.60 kg/m   Wt Readings from Last 3 Encounters:  02/24/21 234 lb 3.2 oz (106.2 kg)  02/05/21 229 lb (103.9 kg)  01/22/21 230 lb (104.3 kg)    Physical Exam Vitals and nursing note reviewed.  Constitutional:      General: He is not in acute distress.    Appearance: He is well-developed. He is not diaphoretic.     Comments: Well-appearing, comfortable, cooperative  HENT:     Head: Normocephalic and atraumatic.  Eyes:     General:        Right eye: No discharge.        Left eye: No discharge.  Conjunctiva/sclera: Conjunctivae normal.  Neck:     Thyroid: No thyromegaly.  Cardiovascular:     Rate and Rhythm: Normal rate and regular rhythm.     Heart sounds: Normal heart sounds. No murmur heard.   Pulmonary:     Effort: Pulmonary effort is normal. No respiratory distress.     Breath sounds: Normal breath sounds. No wheezing or rales.  Musculoskeletal:     Cervical back: Normal range of motion and neck supple.     Comments: Generalized some stiffness with movements wide based stance, able to stand from seated without assistance  Lymphadenopathy:     Cervical: No cervical adenopathy.  Skin:    General: Skin is warm and dry.     Findings: No erythema or rash.  Neurological:     Mental Status: He is alert and oriented to person, place, and time.  Psychiatric:        Behavior: Behavior normal.      Comments: Well groomed, good eye contact, slowed speech and thoughts some word finding.      Results for orders placed or performed in visit on 02/24/21  POCT glycosylated hemoglobin (Hb A1C)  Result Value Ref Range   Hemoglobin A1C 7.0 (A) 4.0 - 5.6 %      Assessment & Plan:   Problem List Items Addressed This Visit    Spondylosis of lumbar region without myelopathy or radiculopathy   Relevant Medications   gabapentin (NEURONTIN) 100 MG capsule   Diabetes mellitus ([place]) - Primary    A1c today 7.0 improved, back on ozempic He is on higher dose of insulin as well Question accuracy of home CBG log, given not waiting >2 hr post prandial Complications - some early peripheral neuropathy, obesity - increases risk of future cardiovascular complications  Never started Trulicity  Plan:  1. Continue Ozempic 0.5mg  weekly, future can adjust dose to inc back to 1mg  if indicated 2. Reduce Novolin insulin based on fasting CBG, should be reducing amount. - Continue Metformin 1000mg  BID - Continue Jardiance 25mg  daily - he says medication helps him urinate 3 Encourage improved lifestyle - low carb, low sugar diet, reduce portion size, continue improving regular exercise 4 Check CBG, bring log to next visit for review 5. Continue ASA, ARB, Statin      Relevant Orders   POCT glycosylated hemoglobin (Hb A1C) (Completed)   Hemoglobin O1H   BASIC METABOLIC PANEL WITH GFR    Other Visit Diagnoses    Recurrent falls       Relevant Orders   Ambulatory referral to Neurology   Bilateral leg weakness       Relevant Orders   Ambulatory referral to Neurology   Slow rate of speech       Relevant Orders   Ambulatory referral to Neurology   Cognitive impairment       Relevant Orders   Ambulatory referral to Neurology   History of COVID-19       Relevant Orders   SARS-CoV-2 Semi-Quantitative Total Antibody, Spike        #Cognitive impairment / Slow Speech Post COVID #Osteoarthritis /  Generalized Weakness S/p kyphoplasty for compression fracture - Followed by Ortho (Dr Harlow Mares), Physical Therapy - Reviewed last report from Rosaura Carpenter DPT - from prior PT referral from Dr Tamala Julian, gradual progress with ROM but there is concern for possible neurological condition with balance, speech deficits, reduced range of motion - Improved w/ Steroid injection R shoulder, will get hip injection as well  Complications post COVID 07/2020 has had limited improvement with cognitive impact with slowed speech and difficulty with slow movements and stiffness, contributing to his difficulty with balance and falls.  Referral to [facility] Neurology for consultation - may warrant further imaging or diagnostics.  Handicap placard form completed today returned to patient  Orders Placed This Encounter  Procedures  . Hemoglobin A1c    Standing Status:   Future    Standing Expiration Date:   07/16/2021  . SARS-CoV-2 Semi-Quantitative Total Antibody, Spike    Standing Status:   Future    Standing Expiration Date:   07/16/2021    Order Specific Question:   Is this test for diagnosis or screening    Answer:   Screening    Order Specific Question:   Symptomatic for COVID-19 as defined by CDC    Answer:   No    Order Specific Question:   Hospitalized for COVID-19    Answer:   No    Order Specific Question:   Admitted to ICU for COVID-19    Answer:   No    Order Specific Question:   Previously tested for COVID-19    Answer:   Yes    Order Specific Question:   Resident in a congregate (group) care setting    Answer:   No    Order Specific Question:   Employed in healthcare setting    Answer:   No  . BASIC METABOLIC PANEL WITH GFR    Standing Status:   Future    Standing Expiration Date:   07/16/2021  . Ambulatory referral to Neurology    Referral Priority:   Routine    Referral Type:   Consultation    Referral Reason:   Specialty Services Required    Requested Specialty:   Neurology    Number of Visits  Requested:   1  . POCT glycosylated hemoglobin (Hb A1C)     Meds ordered this encounter  Medications  . gabapentin (NEURONTIN) 100 MG capsule    Sig: Take 2 capsules (200 mg total) by mouth at bedtime.    Dispense:  180 capsule    Refill:  3      Follow up plan: Return in about 3 months (around 05/26/2021) for 3 month follow-up fasting lab only then 1 week later DM follow-up, Neuro.  Future labs ordered for 05/2021 BMET, COVID antibody test and A1c fasting 3 months   Nobie Putnam, DO [facility] 02/24/2021, 1:50 PM

## 2021-02-24 NOTE — Assessment & Plan Note (Signed)
A1c today 7.0 improved, back on ozempic He is on higher dose of insulin as well Question accuracy of home CBG log, given not waiting >2 hr post prandial Complications - some early peripheral neuropathy, obesity - increases risk of future cardiovascular complications  Never started Trulicity  Plan:  1. Continue Ozempic 0.5mg  weekly, future can adjust dose to inc back to 1mg  if indicated 2. Reduce Novolin insulin based on fasting CBG, should be reducing amount. - Continue Metformin 1000mg  BID - Continue Jardiance 25mg  daily - he says medication helps him urinate 3 Encourage improved lifestyle - low carb, low sugar diet, reduce portion size, continue improving regular exercise 4 Check CBG, bring log to next visit for review 5. Continue ASA, ARB, Statin

## 2021-02-25 ENCOUNTER — Other Ambulatory Visit: Payer: Self-pay | Admitting: Family Medicine

## 2021-02-25 DIAGNOSIS — E1169 Type 2 diabetes mellitus with other specified complication: Secondary | ICD-10-CM

## 2021-02-25 NOTE — Telephone Encounter (Signed)
Requested Prescriptions  Pending Prescriptions Disp Refills  . JARDIANCE 25 MG TABS tablet [Pharmacy Med Name: JARDIANCE 25 MG TABLET] 90 tablet 1    Sig: TAKE 1 TABLET BY MOUTH EVERY DAY     Endocrinology:  Diabetes - SGLT2 Inhibitors Passed - 02/25/2021  2:32 AM      Passed - Cr in normal range and within 360 days    Creat  Date Value Ref Range Status  08/12/2020 0.71 0.70 - 1.25 mg/dL Final    Comment:    For patients >70 years of age, the reference limit for Creatinine is approximately 13% higher for people identified as African-American. .    Creatinine, Ser  Date Value Ref Range Status  02/05/2021 0.76 0.61 - 1.24 mg/dL Final         Passed - LDL in normal range and within 360 days    LDL Cholesterol (Calc)  Date Value Ref Range Status  08/12/2020 93 mg/dL (calc) Final    Comment:    Reference range: <100 . Desirable range <100 mg/dL for primary prevention;   <70 mg/dL for patients with CHD or diabetic patients  with > or = 2 CHD risk factors. Marland Kitchen LDL-C is now calculated using the Martin-Hopkins  calculation, which is a validated novel method providing  better accuracy than the Friedewald equation in the  estimation of LDL-C.  Cresenciano Genre et al. Annamaria Helling. 5038;882(80): 2061-2068  (http://education.[employer].com/faq/FAQ164)          Passed - HBA1C is between 0 and 7.9 and within 180 days    Hemoglobin A1C  Date Value Ref Range Status  02/24/2021 7.0 (A) 4.0 - 5.6 % Final   Hgb A1c MFr Bld  Date Value Ref Range Status  11/26/2020 7.0 (H) <5.7 % of total Hgb Final    Comment:    For someone without known diabetes, a hemoglobin A1c value of 6.5% or greater indicates that they may have  diabetes and this should be confirmed with a follow-up  test. . For someone with known diabetes, a value <7% indicates  that their diabetes is well controlled and a value  greater than or equal to 7% indicates suboptimal  control. A1c targets should be individualized based on   duration of diabetes, age, comorbid conditions, and  other considerations. . Currently, no consensus exists regarding use of hemoglobin A1c for diagnosis of diabetes for children. .          Passed - eGFR in normal range and within 360 days    GFR, Est African American  Date Value Ref Range Status  08/12/2020 112 > OR = 60 mL/min/1.33m Final   GFR, Est Non African American  Date Value Ref Range Status  08/12/2020 96 > OR = 60 mL/min/1.765mFinal   GFR, Estimated  Date Value Ref Range Status  02/05/2021 >60 >60 mL/min Final    Comment:    (NOTE) Calculated using the CKD-EPI Creatinine Equation (2021)          Passed - Valid encounter within last 6 months    Recent Outpatient Visits          Yesterday Type 2 diabetes mellitus with other specified complication, with [facility]-term [facility]   3 months ago Type 2 diabetes mellitus with other specified complication, with [facility]  [facility]oit[facility]Devonne DoughtyDO   6 months ago Annual  physical exam   [place], DO   6 months ago [facility], Jonathan Doughty, DO   7 months ago [facility] [place], Jonathan Raider, Jonathan Shelton      Future Appointments            In 2 weeks Jonathan Shelton, Jonathan Fairly, MD [place]   In 1 month Jonathan Bathe, MD [place]   In [place], Jonathan Shelton, [facility]

## 2021-02-26 ENCOUNTER — Encounter: Payer: Self-pay | Admitting: *Deleted

## 2021-02-26 ENCOUNTER — Ambulatory Visit
Admission: RE | Admit: 2021-02-26 | Discharge: 2021-02-26 | Disposition: A | Discharge status: Home / Self Care | Payer: Medicare HMO | Source: Ambulatory Visit | Attending: Interventional Radiology | Admitting: Interventional Radiology

## 2021-02-26 DIAGNOSIS — S32000A Wedge compression fracture of unspecified lumbar vertebra, initial encounter for closed fracture: Secondary | ICD-10-CM

## 2021-02-26 HISTORY — PX: IR RADIOLOGIST EVAL & MGMT: IMG5224

## 2021-02-26 NOTE — Progress Notes (Signed)
Chief Complaint: Lumbar back pain  Referring Physician(s): Smith,Zachary M  History of Present Illness: Jonathan Shelton is a 70 y.o. male presenting today to [facility] as a follow up, SP L1 vertebral augmentation.   Jonathan Shelton is here today by himself for the appointment.    We treated him for ongoing back pain related to symptomatic L1 compression fracture, with bipedicular approach vertebral augmentation Kyphoplasty technique performed 02/05/21.       He was discharged home on the day of the procedure.    He tells me that he had the typical muscle soreness, but that resolved.  He does have some ongoing back soreness, but he assures me that he is feeling better than previously, and that it seems to be continuously improving.   He takes occasional Alleve, which helps.    His main complaint, it seems is regarding his right shoulder, for which he is receiving PT.   He denies any new back pain.   .   Past Medical History:  Diagnosis Date  . Allergy   . BPH (benign prostatic hyperplasia)   . Colon polyps   . Diabetes ([place])   . Diverticulosis   . Gallstones   . GERD (gastroesophageal reflux disease)   . HOH (hard of hearing)   . Kidney stones   . Renal disorder   . Wears glasses     Past Surgical History:  Procedure Laterality Date  . BIOPSY  01/22/2021   Procedure: BIOPSY;  Surgeon: Rush Landmark Telford Nab., MD;  Location: [place];  Service: Gastroenterology;;  . CARPAL TUNNEL RELEASE    . CHOLECYSTECTOMY    . COLONOSCOPY WITH PROPOFOL N/A 02/06/2020   Procedure: COLONOSCOPY WITH PROPOFOL;  Surgeon: Lin Landsman, MD;  Location: [facility] ENDOSCOPY;  Service: Gastroenterology;  Laterality: N/A;  . COLONOSCOPY WITH PROPOFOL N/A 03/31/2020   Procedure: COLONOSCOPY WITH PROPOFOL;  Surgeon: Rush Landmark Telford Nab., MD;  Location: [place];  Service: Gastroenterology;  Laterality: N/A;  . COLONOSCOPY WITH PROPOFOL N/A 01/22/2021   Procedure:  COLONOSCOPY WITH PROPOFOL;  Surgeon: Rush Landmark Telford Nab., MD;  Location: [place];  Service: Gastroenterology;  Laterality: N/A;  . ENDOSCOPIC MUCOSAL RESECTION N/A 03/31/2020   Procedure: ENDOSCOPIC MUCOSAL RESECTION;  Surgeon: Rush Landmark Telford Nab., MD;  Location: [place];  Service: Gastroenterology;  Laterality: N/A;  . HEMOSTASIS CLIP PLACEMENT  03/31/2020   Procedure: HEMOSTASIS CLIP PLACEMENT;  Surgeon: Irving Copas., MD;  Location: [place];  Service: Gastroenterology;;  . HERNIA REPAIR    . IR KYPHO LUMBAR INC FX REDUCE BONE BX UNI/BIL CANNULATION INC/IMAGING  02/05/2021  . IR RADIOLOGIST EVAL & MGMT  11/28/2020  . IR RADIOLOGIST EVAL & MGMT  02/26/2021  . POLYPECTOMY  03/31/2020   Procedure: POLYPECTOMY;  Surgeon: Mansouraty, Telford Nab., MD;  Location: [place];  Service: Gastroenterology;;  . POLYPECTOMY  01/22/2021   Procedure: POLYPECTOMY;  Surgeon: Irving Copas., MD;  Location: [place];  Service: Gastroenterology;;  . SMALL INTESTINE SURGERY    . SUBMUCOSAL LIFTING INJECTION  03/31/2020   Procedure: SUBMUCOSAL LIFTING INJECTION;  Surgeon: Rush Landmark Telford Nab., MD;  Location: [facility] ENDOSCOPY;  Service: Gastroenterology;;    Allergies: Testosterone  Medications: Prior to Admission medications   Medication Sig Start Date End Date Taking? Authorizing Provider  aspirin 81 MG EC tablet Chew 81 mg by mouth daily.     [provider]  atorvastatin (LIPITOR) 10 MG tablet TAKE 1 TABLET (10 MG TOTAL) BY MOUTH DAILY AT 6 PM.  Patient taking differently: Take 10 mg by mouth every other day. 11/14/20   Karamalegos, Devonne Doughty, DO  finasteride (PROSCAR) 5 MG tablet Take 1 tablet (5 mg total) by mouth daily. 11/24/20   Karamalegos, Devonne Doughty, DO  gabapentin (NEURONTIN) 100 MG capsule Take 2 capsules (200 mg total) by mouth at bedtime. 02/24/21   Karamalegos, Devonne Doughty, DO  glucose blood test strip Check blood sugar up to 4 times daily  07/24/20   [name], DO  Insulin Syringe-Needle U-100 31G X 1/4" 0.5 ML MISC Use syringe to inject insulin into skin daily. 11/24/20   Karamalegos, Devonne Doughty, DO  JARDIANCE 25 MG TABS tablet TAKE 1 TABLET BY MOUTH EVERY DAY 02/25/21   Parks Ranger, Devonne Doughty, DO  levalbuterol [facility] HFA) 45 MCG/ACT inhaler Inhale 1-2 puffs into the lungs every 6 (six) hours as needed for wheezing. Patient not taking: No sig reported 07/10/20   [name], DO  meloxicam (MOBIC) 7.5 MG tablet Take 1 tablet (7.5 mg total) by mouth daily. Patient not taking: Reported on 02/24/2021 01/07/21   Lyndal Pulley, DO  metFORMIN (GLUCOPHAGE) 1000 MG tablet TAKE 1 TABLET (1,000 MG TOTAL) BY MOUTH 2 (TWO) TIMES DAILY WITH A MEAL. 02/21/21   Karamalegos, Devonne Doughty, DO  naproxen sodium (ALEVE) 220 MG tablet Take 660 mg by mouth daily as needed (pain).    [provider]  NOVOLIN N 100 UNIT/ML injection INJECT 0.4 MLS (40 UNITS TOTAL) INTO THE SKIN AT BEDTIME. MAY USE REDUCED DOSE AS ADVISED. Patient taking differently: Inject 40-45 Units into the skin at bedtime. 09/08/20   Malfi, Lupita Raider, FNP  OZEMPIC, 0.25 OR 0.5 MG/DOSE, 2 MG/1.5ML SOPN Inject 0.25 mg into the skin once a week. For first 4 weeks. Then increase dose to 0.5mg  weekly 02/13/21   [name], DO  Pramox-PE-Glycerin-Petrolatum (PREPARATION H) 1-0.25-14.4-15 % CREA Place 1 application rectally daily as needed (hemmorhoids).    [provider]  tamsulosin (FLOMAX) 0.4 MG CAPS capsule TAKE 2 CAPSULES BY MOUTH EVERY DAY Patient taking differently: Take 0.4 mg by mouth See admin instructions. Take 0.4 mg daily may take a second 0.4 mg dose as needed for urinary flow 09/25/20   Stoioff, Ronda Fairly, MD  valsartan (DIOVAN) 80 MG tablet Take 1 tablet (80 mg total) by mouth daily. 04/01/20   Karamalegos, Devonne Doughty, DO  albuterol (VENTOLIN HFA) 108 (90 Base) MCG/ACT inhaler Inhale 1-2 puffs into the lungs every 4 (four)  hours as needed for wheezing or shortness of breath. 07/10/20 07/10/20  Malfi, Lupita Raider, FNP     Family History  Problem Relation Age of Onset  . Heart disease Mother 80  . Heart disease Father   . Stroke Father 15  . Diabetes Father   . Heart attack Father   . Liver disease Neg Hx   . Colon cancer Neg Hx   . Esophageal cancer Neg Hx   . Pancreatic cancer Neg Hx   . Stomach cancer Neg Hx   . Inflammatory bowel disease Neg Hx   . Rectal cancer Neg Hx     Social History   Socioeconomic History  . Marital status: Married    Spouse name: Not on file  . Number of children: Not on file  . Years of education: Graduate Degree  . Highest education level: Master's degree (e.g., MA, MS, MEng, MEd, MSW, MBA)  Occupational History  . Not on file  Tobacco Use  . Smoking status: Former  Smoker    Types: Cigarettes    Quit date: 1970    Years since quitting: 52.3  . Smokeless tobacco: Former Systems developer    Types: Snuff  Vaping Use  . Vaping Use: Never used  Substance and Sexual Activity  . Alcohol use: Not Currently  . Drug use: Not Currently    Comment: past  . Sexual activity: Not on file  Other Topics Concern  . Not on file  Social History Narrative  . Not on file   Social Determinants of Health   Financial Resource Strain: Not on file  Food Insecurity: Not on file  Transportation Needs: Not on file  Physical Activity: Not on file  Stress: Not on file  Social Connections: Not on file      Review of Systems: A 12 point ROS discussed and pertinent positives are indicated in the HPI above.  All other systems are negative.  Review of Systems  Vital Signs: There were no vitals taken for this visit.  Physical Exam  Targeted exam of the back demonstrates no deformity.  The small incision sites are essentially healed, with no sign of infection.  No reproducible pain at the site.   Mallampati Score:     Imaging: IR KYPHO LUMBAR INC FX REDUCE BONE BX UNI/BIL CANNULATION  INC/IMAGING  Result Date: 02/09/2021 INDICATION: 70 year old male with a history of L1 compression fracture presenting for therapy EXAM: IMAGE GUIDED VERTEBRAL AUGMENTATION WITH KYPHOPLASTY TECHNIQUE COMPARISON:  Jonathan 11/22/2020 MEDICATIONS: As antibiotic prophylaxis, 2 g Ancef was ordered pre-procedure and administered intravenously within 1 hour of incision. ANESTHESIA/SEDATION: Moderate (conscious) sedation was employed during this procedure. A total of Versed 2.0 mg and Fentanyl 100 mcg was administered intravenously. Moderate Sedation Time: 33 minutes. The patient's level of consciousness and vital signs were monitored continuously by radiology nursing throughout the procedure under my direct supervision. FLUOROSCOPY TIME:  Fluoroscopy Time: 7 minutes 30 seconds (240 mGy) COMPLICATIONS: None PROCEDURE: Following a full explanation of the procedure along with the potentially associated complications, a witnessed informed consent was obtained. Specific risks that were discussed included bleeding, infection, injury to adjacent structures, neurologic injury, embolization of cement within the veins, failure of the procedure to improve pain, need for further procedure/ surgery, cardiopulmonary collapse, death. The patient understands the risks and wishes to proceed. The patient was placed prone on the fluoroscopic table. Nasal oxygen was administered. Physiologic monitoring was performed throughout the duration of the procedure. The skin overlying the L1 region was prepped and draped in the usual sterile fashion. The L1 vertebral body was identified and the right pedicle was infiltrated with 1% lidocaine. This was then followed by the advancement of an 8-gauge Medtronic needle through the right pedicle into the vertebral body. The bone auger was advanced under fluoroscopy. The left L1 pedicle was then infiltrated with 1% lidocaine. A small incision was made with 11 blade scalpel, and then a Medtronic 8 gauge needle  was advanced through the left pedicle into the mid vertebral body. Bone auger was advanced under fluoroscopy. Simultaneous inflation of the left and right pedicular cannula balloons was performed under fluoroscopic observation. Methylmethacrylate mixture was then reconstituted. Under biplane intermittent fluoroscopy, the methylmethacrylate was then injected into both cavity at the L1 vertebral body with filling of the fracture cleft. No extravasation was noted posteriorly into the spinal canal. No epidural venous contamination was seen. The needles were then removed. Hemostasis was achieved at the skin entry site. There were no acute complications. Patient tolerated the  procedure well. The patient was observed for 30 minutes and returned to room in good condition. IMPRESSION: Status post image guided vertebral augmentation with kyphoplasty technique of L1, via bipedicular approach. Signed, Dulcy Fanny. Dellia Nims, RPVI [facility] Radiology Electronically Signed   By: Corrie Mckusick D.O.   On: 02/09/2021 08:34   IR Radiologist Eval & Mgmt  Result Date: 02/26/2021 Please refer to notes tab for details about interventional procedure. (Op Note)   Labs:  CBC: Recent Labs    07/21/20 0537 07/23/20 0440 08/12/20 0853 02/05/21 1100  WBC 8.5 8.6 5.1 5.7  HGB 15.0 14.2 13.3 15.4  HCT 44.6 42.1 40.0 46.3  PLT 256 243 105* 214    COAGS: Recent Labs    02/05/21 1100  INR 1.0    BMP: Recent Labs    07/20/20 0540 07/21/20 0537 07/23/20 0440 08/12/20 0853 02/05/21 1100  NA 139 132* 136 139 139  K 4.8 4.2 4.2 3.9 4.2  CL 103 99 98 104 106  CO2 26 23 28 24 24   GLUCOSE 329* 287* 247* 81 126*  BUN 33* 25* 28* 11 17  CALCIUM 8.5* 8.1* 8.4* 8.8 9.1  CREATININE 0.81 0.69 0.75 0.71 0.76  GFRNONAA >60 >60 >60 96 >60  GFRAA >60 >60 >60 112  --     LIVER FUNCTION TESTS: Recent Labs    07/19/20 0455 07/20/20 0540 07/21/20 0537 07/23/20 0440  08/12/20 0853  BILITOT 1.0 1.6* 1.6* 1.3* 0.5  AST 46* 40 41 24 14  ALT 50* 48* 61* 54* 15  ALKPHOS 52 53 54 53  --   PROT 6.4* 5.7* 5.7* 5.5* 6.2  ALBUMIN 2.7* 2.7* 2.6* 2.6*  --     TUMOR MARKERS: No results for input(s): AFPTM, CEA, CA199, CHROMGRNA in the last 8760 hours.  Assessment and Plan:  Jonathan Shelton is SP L1 vertebral augmentation for symptomatic compression fracture, with overall improved symptoms.   I did discuss with him our typical outcomes, which is improvement but rarely complete resolution.  I did reinforce our pre-operative conversation regarding goals of therapy which is to reduce pain to restore function and decrease the need for high doses of pain.    He is very appreciative of our help, and seems satisfied with the outcome.  He was very kind to offer his formal thanks for helping him.    We would be happy to see him back on as needed basis.   Electronically Signed: Corrie Mckusick 02/26/2021, 1:51 PM   I spent a total of    15 Minutes in face to face in clinical consultation, greater than 50% of which was counseling/coordinating care for L1 compression fracture, SP vertebral augmentation/KP.

## 2021-03-15 NOTE — Progress Notes (Shared)
03/15/2021  10:35 PM   Jonathan Shelton [birth year] [id number]  Referring provider: [name], DO [address] [place],  [place] [address] No chief complaint on file.   HPI: Jonathan Shelton is a 70 y.o. male with a personal history of ***, who presents today for    PMH: Past Medical History:  Diagnosis Date  . Allergy   . BPH (benign prostatic hyperplasia)   . Colon polyps   . Diabetes ([place])   . Diverticulosis   . Gallstones   . GERD (gastroesophageal reflux disease)   . HOH (hard of hearing)   . Kidney stones   . Renal disorder   . Wears glasses     Surgical History: Past Surgical History:  Procedure Laterality Date  . BIOPSY  01/22/2021   Procedure: BIOPSY;  Surgeon: Rush Landmark Telford Nab., MD;  Location: [place];  Service: Gastroenterology;;  . CARPAL TUNNEL RELEASE    . CHOLECYSTECTOMY    . COLONOSCOPY WITH PROPOFOL N/A 02/06/2020   Procedure: COLONOSCOPY WITH PROPOFOL;  Surgeon: Lin Landsman, MD;  [facility] ENDOSCOPY;  Service: Gastroenterology;  Laterality: N/A;  . COLONOSCOPY WITH PROPOFOL N/A 03/31/2020   Procedure: COLONOSCOPY WITH PROPOFOL;  Surgeon: Rush Landmark Telford Nab., MD;  Location: [place];  Service: Gastroenterology;  Laterality: N/A;  . COLONOSCOPY WITH PROPOFOL N/A 01/22/2021   Procedure: COLONOSCOPY WITH PROPOFOL;  Surgeon: Rush Landmark Telford Nab., MD;  Location: [place];  Service: Gastroenterology;  Laterality: N/A;  . ENDOSCOPIC MUCOSAL RESECTION N/A 03/31/2020   Procedure: ENDOSCOPIC MUCOSAL RESECTION;  Surgeon: Rush Landmark Telford Nab., MD;  Location: [place];  Service: Gastroenterology;  Laterality: N/A;  . HEMOSTASIS CLIP PLACEMENT  03/31/2020   Procedure: HEMOSTASIS CLIP PLACEMENT;  Surgeon: Irving Copas., MD;  Location: [place];  Service: Gastroenterology;;  . HERNIA REPAIR    . IR KYPHO LUMBAR INC FX REDUCE BONE BX UNI/BIL CANNULATION INC/IMAGING  02/05/2021  . IR RADIOLOGIST EVAL &  MGMT  11/28/2020  . IR RADIOLOGIST EVAL & MGMT  02/26/2021  . POLYPECTOMY  03/31/2020   Procedure: POLYPECTOMY;  Surgeon: Mansouraty, Telford Nab., MD;  Location: [place];  Service: Gastroenterology;;  . POLYPECTOMY  01/22/2021   Procedure: POLYPECTOMY;  Surgeon: Irving Copas., MD;  Location: [place];  Service: Gastroenterology;;  . SMALL INTESTINE SURGERY    . SUBMUCOSAL LIFTING INJECTION  03/31/2020   Procedure: SUBMUCOSAL LIFTING INJECTION;  Surgeon: Rush Landmark Telford Nab., MD;  Location: [place];  Service: Gastroenterology;;    Home Medications:  Allergies as of 03/16/2021      Reactions   Testosterone Rash   patch      Medication List       Accurate as of Mar 15, 2021 10:35 PM. If you have any questions, ask your nurse or doctor.        aspirin 81 MG EC tablet Chew 81 mg by mouth daily.   atorvastatin 10 MG tablet Commonly known as: LIPITOR TAKE 1 TABLET (10 MG TOTAL) BY MOUTH DAILY AT 6 PM. What changed: when to take this   finasteride 5 MG tablet Commonly known as: PROSCAR Take 1 tablet (5 mg total) by mouth daily.   gabapentin 100 MG capsule Commonly known as: NEURONTIN Take 2 capsules (200 mg total) by mouth at bedtime.   glucose blood test strip Check blood sugar up to 4 times daily   Insulin Syringe-Needle U-100 31G X 1/4" 0.5 ML Misc Use syringe to inject insulin into skin daily.  Jardiance 25 MG Tabs tablet Generic drug: empagliflozin TAKE 1 TABLET BY MOUTH EVERY DAY   levalbuterol 45 MCG/ACT inhaler Commonly known as: XOPENEX HFA Inhale 1-2 puffs into the lungs every 6 (six) hours as needed for wheezing.   meloxicam 7.5 MG tablet Commonly known as: MOBIC Take 1 tablet (7.5 mg total) by mouth daily.   metFORMIN 1000 MG tablet Commonly known as: GLUCOPHAGE TAKE 1 TABLET (1,000 MG TOTAL) BY MOUTH 2 (TWO) TIMES DAILY WITH A MEAL.   naproxen sodium 220 MG tablet Commonly known as: ALEVE Take 660 mg by mouth daily as needed  (pain).   NovoLIN N 100 UNIT/ML injection Generic drug: insulin NPH Human INJECT 0.4 MLS (40 UNITS TOTAL) INTO THE SKIN AT BEDTIME. MAY USE REDUCED DOSE AS ADVISED. What changed: See the new instructions.   Ozempic (0.25 or 0.5 MG/DOSE) 2 MG/1.5ML Sopn Generic drug: Semaglutide(0.25 or 0.5MG /DOS) Inject 0.25 mg into the skin once a week. For first 4 weeks. Then increase dose to 0.5mg  weekly   Preparation H 1-0.25-14.4-15 % Crea Generic drug: Pramox-PE-Glycerin-Petrolatum Place 1 application rectally daily as needed (hemmorhoids).   tamsulosin 0.4 MG Caps capsule Commonly known as: FLOMAX TAKE 2 CAPSULES BY MOUTH EVERY DAY What changed:   how much to take  when to take this  additional instructions   valsartan 80 MG tablet Commonly known as: DIOVAN Take 1 tablet (80 mg total) by mouth daily.       Allergies: Allergies  Allergen Reactions  . Testosterone Rash    patch    Family History: Family History  Problem Relation Age of Onset  . Heart disease Mother 16  . Heart disease Father   . Stroke Father 39  . Diabetes Father   . Heart attack Father   . Liver disease Neg Hx   . Colon cancer Neg Hx   . Esophageal cancer Neg Hx   . Pancreatic cancer Neg Hx   . Stomach cancer Neg Hx   . Inflammatory bowel disease Neg Hx   . Rectal cancer Neg Hx     Social History:   reports that he quit smoking about 52 years ago. His smoking use included cigarettes. He has quit using smokeless tobacco.  His smokeless tobacco use included snuff. He reports previous alcohol use. He reports previous drug use.  ROS: Pertinent ROS in HPI.  Physical Exam: There were no vitals taken for this visit.  Constitutional:  Alert and oriented, No acute distress. HEENT: [place] AT, moist mucus membranes.  Trachea midline, no masses. Cardiovascular: No clubbing, cyanosis, or edema. Respiratory: Normal respiratory effort, no increased work of breathing. ***GI: Abdomen is soft, non tender, non  distended, no abdominal masses. Liver and spleen not palpable.  No hernias appreciated.  Stool sample for occult testing is not indicated. ***GU: Phallus circumcised/uncircumcised without lesions, testes descended bilaterally without masses or tenderness, spermatic cord/epididymis palpably normal bilaterally.  Vasa palpable bilaterally ***Rectal: Prostate is *** grams, no nodules, non-tender, with normal sphincter tone. Skin: No rashes, bruises or suspicious lesions. Neurologic: Grossly intact, no focal deficits, moving all 4 extremities. Psychiatric: Normal mood and affect.  Laboratory Data:  Lab Results  Component Value Date   CREATININE 0.76 02/05/2021     Lab Results  Component Value Date   PSA 0.15 08/12/2020     Lab Results  Component Value Date   TSH 3.18 08/12/2020     No results found for: TESTOSTERONE   Lab Results  Component Value Date  HGBA1C 7.0 (A) 02/24/2021     I have reviewed the labs.  Urinalysis    I have reviewed the labs.  Urine Culture:   I have reviewed the labs.  Pertinent Imaging:   No results found for this or any previous visit.    I have personally reviewed the images and agree with radiologist interpretation.    Assessment & Plan:      Follow Up:  No follow-ups on file.   [facility] [address] [place], [place] [address] [phone]

## 2021-03-16 ENCOUNTER — Ambulatory Visit: Payer: Medicare HMO | Admitting: Urology

## 2021-03-28 ENCOUNTER — Other Ambulatory Visit: Payer: Self-pay | Admitting: Urology

## 2021-03-30 ENCOUNTER — Ambulatory Visit: Payer: Medicare HMO | Admitting: Dermatology

## 2021-04-02 ENCOUNTER — Emergency Department
Admission: EM | Admit: 2021-04-02 | Discharge: 2021-04-02 | Disposition: A | Discharge status: Home / Self Care | Payer: Medicare HMO | Attending: Emergency Medicine | Admitting: Emergency Medicine

## 2021-04-02 ENCOUNTER — Emergency Department: Payer: Medicare HMO

## 2021-04-02 ENCOUNTER — Other Ambulatory Visit: Payer: Self-pay

## 2021-04-02 DIAGNOSIS — Z87891 Personal history of nicotine dependence: Secondary | ICD-10-CM | POA: Insufficient documentation

## 2021-04-02 DIAGNOSIS — S42211A Unspecified displaced fracture of surgical neck of right humerus, initial encounter for closed fracture: Secondary | ICD-10-CM | POA: Diagnosis not present

## 2021-04-02 DIAGNOSIS — Z7984 Long term (current) use of oral hypoglycemic drugs: Secondary | ICD-10-CM | POA: Insufficient documentation

## 2021-04-02 DIAGNOSIS — W010XXA Fall on same level from slipping, tripping and stumbling without subsequent striking against object, initial encounter: Secondary | ICD-10-CM | POA: Insufficient documentation

## 2021-04-02 DIAGNOSIS — E1169 Type 2 diabetes mellitus with other specified complication: Secondary | ICD-10-CM | POA: Diagnosis not present

## 2021-04-02 DIAGNOSIS — Z794 Long term (current) use of insulin: Secondary | ICD-10-CM | POA: Diagnosis not present

## 2021-04-02 DIAGNOSIS — E785 Hyperlipidemia, unspecified: Secondary | ICD-10-CM | POA: Diagnosis not present

## 2021-04-02 DIAGNOSIS — Z79899 Other long term (current) drug therapy: Secondary | ICD-10-CM | POA: Insufficient documentation

## 2021-04-02 DIAGNOSIS — W19XXXA Unspecified fall, initial encounter: Secondary | ICD-10-CM

## 2021-04-02 DIAGNOSIS — I10 Essential (primary) hypertension: Secondary | ICD-10-CM | POA: Diagnosis not present

## 2021-04-02 DIAGNOSIS — Z7982 Long term (current) use of aspirin: Secondary | ICD-10-CM | POA: Insufficient documentation

## 2021-04-02 DIAGNOSIS — Y92007 Garden or yard of unspecified non-institutional (private) residence as the place of occurrence of the external cause: Secondary | ICD-10-CM | POA: Insufficient documentation

## 2021-04-02 DIAGNOSIS — S4991XA Unspecified injury of right shoulder and upper arm, initial encounter: Secondary | ICD-10-CM | POA: Diagnosis present

## 2021-04-02 MED ORDER — OXYCODONE-ACETAMINOPHEN 5-325 MG PO TABS: 1.0000 | ORAL_TABLET | Freq: Four times a day (QID) | ORAL | 0 refills | Status: DC | PRN

## 2021-04-02 MED ORDER — OXYCODONE-ACETAMINOPHEN 5-325 MG PO TABS
1.0000 | ORAL_TABLET | Freq: Once | ORAL | Status: AC
  Administered 2021-04-02: 1 via ORAL
  Filled 2021-04-02: qty 1

## 2021-04-02 NOTE — ED Provider Notes (Signed)
[facility] Emergency Department Provider Note  ____________________________________________  Time seen: Approximately 10:06 PM  I have reviewed the triage vital signs and the nursing notes.   HISTORY  Chief Complaint Fall    HPI Jonathan Shelton is a 70 y.o. male who presents the emergency department complaining of decreased range of motion and pain to the right shoulder after mechanical fall.  He was going up the steps to his deck when he fell backwards and landed on his shoulder.  His arm was next to his side when he landed.  He did not hit his head or lose consciousness.  He is having pain and difficulty moving his shoulder at this time.  He was already under the care of orthopedics for bursitis and states that the pain is excruciating at this time.  No other injury or complaint currently.         Past Medical History:  Diagnosis Date  . Allergy   . BPH (benign prostatic hyperplasia)   . Colon polyps   . Diabetes ([place])   . Diverticulosis   . Gallstones   . GERD (gastroesophageal reflux disease)   . HOH (hard of hearing)   . Kidney stones   . Renal disorder   . Wears glasses     Patient Active Problem List   Diagnosis Date Noted  . Compression fracture of L1 lumbar vertebra ([place]) 11/24/2020  . Spondylosis of lumbar region without myelopathy or radiculopathy 11/24/2020  . Congenital spinal stenosis of lumbar region 11/24/2020  . Acute low back pain 11/19/2020  . Hyperlipidemia 10/24/2020  . Bilateral primary osteoarthritis of knee 08/20/2020  . Hyperlipidemia associated with type 2 diabetes mellitus ([place]) 08/19/2020  . Essential hypertension 08/19/2020  . Fever 07/10/2020  . Cecal polyp 02/15/2020  . Hx of adenomatous polyp of colon 02/15/2020  . Abnormal colonoscopy 02/15/2020  . Tubular adenoma of colon 02/11/2020  . Varicose veins of bilateral lower extremities with pain 07/04/2019  . Diabetes mellitus ([place]) 05/30/2019  . Benign  prostatic hyperplasia with incomplete bladder emptying 05/30/2019  . Obesity (BMI 30.0-34.9) 05/30/2019  . Rash 05/06/2019    Past Surgical History:  Procedure Laterality Date  . BIOPSY  01/22/2021   Procedure: BIOPSY;  Surgeon: Rush Landmark Telford Nab., MD;  Location: [place];  Service: Gastroenterology;;  . CARPAL TUNNEL RELEASE    . CHOLECYSTECTOMY    . COLONOSCOPY WITH PROPOFOL N/A 02/06/2020   Procedure: COLONOSCOPY WITH PROPOFOL;  Surgeon: Lin Landsman, MD;  Location: [facility] ENDOSCOPY;  Service: Gastroenterology;  Laterality: N/A;  . COLONOSCOPY WITH PROPOFOL N/A 03/31/2020   Procedure: COLONOSCOPY WITH PROPOFOL;  Surgeon: Rush Landmark Telford Nab., MD;  Location: [place];  Service: Gastroenterology;  Laterality: N/A;  . COLONOSCOPY WITH PROPOFOL N/A 01/22/2021   Procedure: COLONOSCOPY WITH PROPOFOL;  Surgeon: Rush Landmark Telford Nab., MD;  Location: [place];  Service: Gastroenterology;  Laterality: N/A;  . ENDOSCOPIC MUCOSAL RESECTION N/A 03/31/2020   Procedure: ENDOSCOPIC MUCOSAL RESECTION;  Surgeon: Rush Landmark Telford Nab., MD;  Location: [place];  Service: Gastroenterology;  Laterality: N/A;  . HEMOSTASIS CLIP PLACEMENT  03/31/2020   Procedure: HEMOSTASIS CLIP PLACEMENT;  Surgeon: Irving Copas., MD;  Location: [place];  Service: Gastroenterology;;  . HERNIA REPAIR    . IR KYPHO LUMBAR INC FX REDUCE BONE BX UNI/BIL CANNULATION INC/IMAGING  02/05/2021  . IR RADIOLOGIST EVAL & MGMT  11/28/2020  . IR RADIOLOGIST EVAL & MGMT  02/26/2021  . POLYPECTOMY  03/31/2020   Procedure: POLYPECTOMY;  Surgeon: Irving Copas., MD;  Location: [place];  Service: Gastroenterology;;  . POLYPECTOMY  01/22/2021   Procedure: POLYPECTOMY;  Surgeon: Irving Copas., MD;  Location: [place];  Service: Gastroenterology;;  . SMALL INTESTINE SURGERY    . SUBMUCOSAL LIFTING INJECTION  03/31/2020   Procedure: SUBMUCOSAL LIFTING INJECTION;  Surgeon:  Rush Landmark Telford Nab., MD;  Location: [place];  Service: Gastroenterology;;    Prior to Admission medications   Medication Sig Start Date End Date Taking? Authorizing Provider  oxyCODONE-acetaminophen (PERCOCET/ROXICET) 5-325 MG tablet Take 1 tablet by mouth every 6 (six) hours as needed for severe pain. 04/02/21  Yes Dewell Monnier, Charline Bills, PA-C  aspirin 81 MG EC tablet Chew 81 mg by mouth daily.     [provider]  atorvastatin (LIPITOR) 10 MG tablet TAKE 1 TABLET (10 MG TOTAL) BY MOUTH DAILY AT 6 PM. Patient taking differently: Take 10 mg by mouth every other day. 11/14/20   Karamalegos, Devonne Doughty, DO  finasteride (PROSCAR) 5 MG tablet Take 1 tablet (5 mg total) by mouth daily. 11/24/20   Karamalegos, Devonne Doughty, DO  gabapentin (NEURONTIN) 100 MG capsule Take 2 capsules (200 mg total) by mouth at bedtime. 02/24/21   Karamalegos, Devonne Doughty, DO  glucose blood test strip Check blood sugar up to 4 times daily 07/24/20   [name], DO  Insulin Syringe-Needle U-100 31G X 1/4" 0.5 ML MISC Use syringe to inject insulin into skin daily. 11/24/20   Karamalegos, Devonne Doughty, DO  JARDIANCE 25 MG TABS tablet TAKE 1 TABLET BY MOUTH EVERY DAY 02/25/21   Parks Ranger, Devonne Doughty, DO  levalbuterol [facility] HFA) 45 MCG/ACT inhaler Inhale 1-2 puffs into the lungs every 6 (six) hours as needed for wheezing. Patient not taking: No sig reported 07/10/20   [name], DO  meloxicam (MOBIC) 7.5 MG tablet Take 1 tablet (7.5 mg total) by mouth daily. Patient not taking: Reported on 02/24/2021 01/07/21   Lyndal Pulley, DO  metFORMIN (GLUCOPHAGE) 1000 MG tablet TAKE 1 TABLET (1,000 MG TOTAL) BY MOUTH 2 (TWO) TIMES DAILY WITH A MEAL. 02/21/21   Karamalegos, Devonne Doughty, DO  naproxen sodium (ALEVE) 220 MG tablet Take 660 mg by mouth daily as needed (pain).    [provider]  NOVOLIN N 100 UNIT/ML injection INJECT 0.4 MLS (40 UNITS TOTAL) INTO THE SKIN AT BEDTIME. MAY USE  REDUCED DOSE AS ADVISED. Patient taking differently: Inject 40-45 Units into the skin at bedtime. 09/08/20   Malfi, Lupita Raider, FNP  OZEMPIC, 0.25 OR 0.5 MG/DOSE, 2 MG/1.5ML SOPN Inject 0.25 mg into the skin once a week. For first 4 weeks. Then increase dose to 0.5mg  weekly 02/13/21   [name], DO  Pramox-PE-Glycerin-Petrolatum (PREPARATION H) 1-0.25-14.4-15 % CREA Place 1 application rectally daily as needed (hemmorhoids).    [provider]  tamsulosin (FLOMAX) 0.4 MG CAPS capsule TAKE 2 CAPSULES BY MOUTH EVERY DAY 03/30/21   Stoioff, Ronda Fairly, MD  valsartan (DIOVAN) 80 MG tablet Take 1 tablet (80 mg total) by mouth daily. 04/01/20   Karamalegos, Devonne Doughty, DO  albuterol (VENTOLIN HFA) 108 (90 Base) MCG/ACT inhaler Inhale 1-2 puffs into the lungs every 4 (four) hours as needed for wheezing or shortness of breath. 07/10/20 07/10/20  Malfi, Lupita Raider, FNP    Allergies Testosterone  Family History  Problem Relation Age of Onset  . Heart disease Mother 44  . Heart disease Father   . Stroke Father 3  . Diabetes Father   .  Heart attack Father   . Liver disease Neg Hx   . Colon cancer Neg Hx   . Esophageal cancer Neg Hx   . Pancreatic cancer Neg Hx   . Stomach cancer Neg Hx   . Inflammatory bowel disease Neg Hx   . Rectal cancer Neg Hx     Social History Social History   Tobacco Use  . Smoking status: Former Smoker    Types: Cigarettes    Quit date: 1970    Years since quitting: 52.4  . Smokeless tobacco: Former Systems developer    Types: Snuff  Vaping Use  . Vaping Use: Never used  Substance Use Topics  . Alcohol use: Not Currently  . Drug use: Not Currently    Comment: past     Review of Systems  Constitutional: No fever/chills Eyes: No visual changes. No discharge ENT: No upper respiratory complaints. Cardiovascular: no chest pain. Respiratory: no cough. No SOB. Gastrointestinal: No abdominal pain.  No nausea, no vomiting.  No diarrhea.  No  constipation. Musculoskeletal: Positive for right shoulder injury Skin: Negative for rash, abrasions, lacerations, ecchymosis. Neurological: Negative for headaches, focal weakness or numbness.  10 System ROS otherwise negative.  ____________________________________________   PHYSICAL EXAM:  VITAL SIGNS: ED Triage Vitals  Enc Vitals Group     BP 04/02/21 2102 138/69     Pulse Rate 04/02/21 2102 85     Resp 04/02/21 2102 20     Temp 04/02/21 2102 98.9 F (37.2 C)     Temp Source 04/02/21 2102 Oral     SpO2 04/02/21 2039 99 %     Weight 04/02/21 2103 231 lb (104.8 kg)     Height 04/02/21 2103 5\' 10"  (1.778 m)     Head Circumference --      Peak Flow --      Pain Score 04/02/21 2103 10     Pain Loc --      Pain Edu? --      Excl. in [place]? --      Constitutional: Alert and oriented. Well appearing and in no acute distress. Eyes: Conjunctivae are normal. PERRL. EOMI. Head: Atraumatic. ENT:      Ears:       Nose: No congestion/rhinnorhea.      Mouth/Throat: Mucous membranes are moist.  Neck: No stridor.   No tenderness to palpation over the cervical spine. Cardiovascular: Normal rate, regular rhythm. Normal S1 and S2.  Good peripheral circulation. Respiratory: Normal respiratory effort without tachypnea or retractions. Lungs CTAB. Good air entry to the bases with no decreased or absent breath sounds. Musculoskeletal: Full range of motion to all extremities. No gross deformities appreciated. Neurologic:  Normal speech and language. No gross focal neurologic deficits are appreciated.  Skin:  Skin is warm, dry and intact. No rash noted. Psychiatric: Mood and affect are normal. Speech and behavior are normal. Patient exhibits appropriate insight and judgement.   ____________________________________________   LABS (all labs ordered are listed, but only abnormal results are displayed)  Labs Reviewed - No data to  display ____________________________________________  EKG   ____________________________________________  RADIOLOGY I personally viewed and evaluated these images as part of my medical decision making, as well as reviewing the written report by the radiologist.  ED Provider Interpretation: Concur with surgical neck fracture with minimal displacement  DG Shoulder Right  Result Date: 04/02/2021 CLINICAL DATA:  fall EXAM: RIGHT SHOULDER - 2+ VIEW COMPARISON:  None. FINDINGS: Very minimally displaced fracture of the surgical neck of  the humerus. Humeral head appears well seated within the glenoid. Degenerative change of the glenohumeral and acromioclavicular joints. Visualized lung fields are clear. No displaced rib fracture visualized within the field of view. IMPRESSION: Fracture of the surgical neck of the humerus with very minimal displacement. Electronically Signed   By: Dahlia Bailiff MD   On: 04/02/2021 21:21    ____________________________________________    PROCEDURES  Procedure(s) performed:    Procedures    Medications  oxyCODONE-acetaminophen (PERCOCET/ROXICET) 5-325 MG per tablet 1 tablet (has no administration in time range)     ____________________________________________   INITIAL IMPRESSION / ASSESSMENT AND PLAN / ED COURSE  Pertinent labs & imaging results that were available during my care of the patient were reviewed by me and considered in my medical decision making (see chart for details).  Review of the [place] CSRS was performed in accordance of the [place] prior to dispensing any controlled drugs.           Patient's diagnosis is consistent with fall, surgical neck fracture.  Patient presented to the emergency department complaining of shoulder pain after a mechanical fall.  Patient landed directly on the shoulder.  No other injury or complaint.  He did not hit his head or lose consciousness.  Imaging revealed minimally displaced surgical neck fracture  of the right humerus.  Patient will be given a shoulder immobilizer and pain medication.  Follow-up with orthopedics..  Patient is given ED precautions to return to the ED for any worsening or new symptoms.     ____________________________________________  FINAL CLINICAL IMPRESSION(S) / ED DIAGNOSES  Final diagnoses:  Fall, initial encounter  Closed displaced fracture of surgical neck of right humerus, unspecified fracture morphology, initial encounter      NEW MEDICATIONS STARTED DURING THIS VISIT:  ED Discharge Orders         Ordered    oxyCODONE-acetaminophen (PERCOCET/ROXICET) 5-325 MG tablet  Every 6 hours PRN        04/02/21 2205              This chart was dictated using voice recognition software/Dragon. Despite best efforts to proofread, errors can occur which can change the meaning. Any change was purely unintentional.    Darletta Moll, PA-C 04/02/21 2210    Duffy Bruce, MD 04/07/21 2057

## 2021-04-02 NOTE — ED Notes (Signed)
Patient reports falling onto the right shoulder from deck stairs, injuring his right arm and right shoulder. Patient reports inability to lift right arm without significant discomfort. Patient denies dizziness, chest pain, or SOB prior to fall. Patient reports losing his balance and tripping.

## 2021-04-02 NOTE — ED Triage Notes (Signed)
Pt tripped and fell onto right shoulder, states has hx of bursitis to same. Pt co worsening pain, no other injury.

## 2021-04-02 NOTE — ED Notes (Signed)
Patient is resting comfortably in wheelchair. INT noted to L hand.

## 2021-04-02 NOTE — ED Triage Notes (Signed)
EMS brings pt in from home after tripping and falling in front yard; c/o rt shoulder pain

## 2021-04-08 ENCOUNTER — Other Ambulatory Visit: Payer: Self-pay

## 2021-04-08 DIAGNOSIS — E1169 Type 2 diabetes mellitus with other specified complication: Secondary | ICD-10-CM

## 2021-04-08 DIAGNOSIS — Z794 Long term (current) use of insulin: Secondary | ICD-10-CM

## 2021-04-08 MED ORDER — INSULIN NPH (HUMAN) (ISOPHANE) 100 UNIT/ML ~~LOC~~ SUSP
SUBCUTANEOUS | 1 refills | Status: DC
Start: 2021-04-08 — End: 2021-10-28

## 2021-04-09 ENCOUNTER — Ambulatory Visit: Payer: Medicare HMO | Admitting: Family Medicine

## 2021-04-16 ENCOUNTER — Telehealth: Payer: Self-pay | Admitting: Family Medicine

## 2021-04-16 NOTE — Telephone Encounter (Signed)
Faythe Ghee, he is scheduled for 05/26/21 for labs with a 1 wk f/u on the 19th. We can order labs now if that's ok, then I can go ahead and have them released. Thank you!

## 2021-04-16 NOTE — Telephone Encounter (Signed)
Yes. Our last visit 02/24/21 we did a fingerstick A1c.  Next plan was for 3 months approximately 05/26/21. For a blood draw for Chemistry (BMET) + A1c + COVID Antibody level.  Then follow-up with me 1 week later For Diabetes.  Nobie Putnam, DO [facility] 04/16/2021, 11:52 AM

## 2021-04-16 NOTE — Telephone Encounter (Signed)
Patient asked if Dr Raliegh Ip still wanted him to do a A1C

## 2021-04-21 ENCOUNTER — Ambulatory Visit (INDEPENDENT_AMBULATORY_CARE_PROVIDER_SITE_OTHER): Payer: Medicare HMO

## 2021-04-21 VITALS — Ht 70.0 in | Wt 230.0 lb

## 2021-04-21 DIAGNOSIS — E119 Type 2 diabetes mellitus without complications: Secondary | ICD-10-CM | POA: Insufficient documentation

## 2021-04-21 DIAGNOSIS — R2689 Other abnormalities of gait and mobility: Secondary | ICD-10-CM | POA: Insufficient documentation

## 2021-04-21 DIAGNOSIS — Z0000 Encounter for general adult medical examination without abnormal findings: Secondary | ICD-10-CM | POA: Diagnosis not present

## 2021-04-21 DIAGNOSIS — R296 Repeated falls: Secondary | ICD-10-CM | POA: Insufficient documentation

## 2021-04-21 DIAGNOSIS — Z79899 Other long term (current) drug therapy: Secondary | ICD-10-CM | POA: Insufficient documentation

## 2021-04-21 NOTE — Progress Notes (Signed)
I connected with Jonathan Shelton today by telephone and verified that I am speaking with the correct person using two identifiers. Location patient: home Location provider: work Persons participating in the virtual visit: Jonathan Shelton, Glenna Durand LPN.   I discussed the limitations, risks, security and privacy concerns of performing an evaluation and management service by telephone and the availability of in person appointments. I also discussed with the patient that there may be a patient responsible charge related to this service. The patient expressed understanding and verbally consented to this telephonic visit.    Interactive audio and video telecommunications were attempted between this provider and patient, however failed, due to patient having technical difficulties OR patient did not have access to video capability.  We continued and completed visit with audio only.     Vital signs may be patient reported or missing.  Subjective:   Jonathan Shelton is a 70 y.o. male who presents for Medicare Annual/Subsequent preventive examination.  Review of Systems     Cardiac Risk Factors include: advanced age (>70men, >51 women);diabetes mellitus;dyslipidemia;hypertension;male gender;obesity (BMI >30kg/m2);sedentary lifestyle     Objective:    Today's Vitals   04/21/21 1451 04/21/21 1452  Weight: 230 lb (104.3 kg)   Height: 5\' 10"  (1.778 m)   PainSc:  7    Body mass index is 33 kg/m.  Advanced Directives 04/21/2021 04/02/2021 01/22/2021 07/16/2020 07/16/2020 03/31/2020 02/06/2020  Does Patient Have a Medical Advance Directive? Yes No Yes Yes No Yes Yes  Type of Paramedic of [place];Living will - [place];Living will [place];Living will - [place];Living will Living will;Healthcare Power of Attorney  Does patient want to make changes to medical advance directive? - - - No - Patient declined - - -  Copy  of [place] in Chart? No - copy requested - Yes - validated most recent copy scanned in chart (See row information) No - copy requested - No - copy requested -  Would patient like information on creating a medical advance directive? - - - No - Patient declined - - -    Current Medications (verified) Outpatient Encounter Medications as of 04/21/2021  Medication Sig  . atorvastatin (LIPITOR) 10 MG tablet TAKE 1 TABLET (10 MG TOTAL) BY MOUTH DAILY AT 6 PM. (Patient taking differently: Take 10 mg by mouth every other day.)  . finasteride (PROSCAR) 5 MG tablet Take 1 tablet (5 mg total) by mouth daily.  Marland Kitchen glucose blood test strip Check blood sugar up to 4 times daily  . insulin NPH Human (NOVOLIN N) 100 UNIT/ML injection INJECT 0.4 MLS (40 UNITS TOTAL) INTO THE SKIN AT BEDTIME. MAY USE REDUCED DOSE AS ADVISED.  . Insulin Syringe-Needle U-100 31G X 1/4" 0.5 ML MISC Use syringe to inject insulin into skin daily.  Marland Kitchen JARDIANCE 25 MG TABS tablet TAKE 1 TABLET BY MOUTH EVERY DAY  . metFORMIN (GLUCOPHAGE) 1000 MG tablet TAKE 1 TABLET (1,000 MG TOTAL) BY MOUTH 2 (TWO) TIMES DAILY WITH A MEAL.  . naproxen sodium (ALEVE) 220 MG tablet Take 660 mg by mouth daily as needed (pain).  Marland Kitchen oxyCODONE-acetaminophen (PERCOCET/ROXICET) 5-325 MG tablet Take 1 tablet by mouth every 6 (six) hours as needed for severe pain.  Marland Kitchen OZEMPIC, 0.25 OR 0.5 MG/DOSE, 2 MG/1.5ML SOPN Inject 0.25 mg into the skin once a week. For first 4 weeks. Then increase dose to 0.5mg  weekly  . Pramox-PE-Glycerin-Petrolatum (PREPARATION H) 1-0.25-14.4-15 % CREA Place  1 application rectally daily as needed (hemmorhoids).  . tamsulosin (FLOMAX) 0.4 MG CAPS capsule TAKE 2 CAPSULES BY MOUTH EVERY DAY  . valsartan (DIOVAN) 80 MG tablet Take 1 tablet (80 mg total) by mouth daily.  Marland Kitchen aspirin 81 MG EC tablet Chew 81 mg by mouth daily.  (Patient not taking: Reported on 04/21/2021)  . gabapentin (NEURONTIN) 100 MG capsule Take 2 capsules (200 mg  total) by mouth at bedtime. (Patient not taking: Reported on 04/21/2021)  . levalbuterol (XOPENEX HFA) 45 MCG/ACT inhaler Inhale 1-2 puffs into the lungs every 6 (six) hours as needed for wheezing. (Patient not taking: No sig reported)  . meloxicam (MOBIC) 7.5 MG tablet Take 1 tablet (7.5 mg total) by mouth daily. (Patient not taking: No sig reported)  . [DISCONTINUED] albuterol (VENTOLIN HFA) 108 (90 Base) MCG/ACT inhaler Inhale 1-2 puffs into the lungs every 4 (four) hours as needed for wheezing or shortness of breath.   No facility-administered encounter medications on file as of 04/21/2021.    Allergies (verified) Testosterone   History: Past Medical History:  Diagnosis Date  . Allergy   . BPH (benign prostatic hyperplasia)   . Colon polyps   . Diabetes ([place])   . Diverticulosis   . Gallstones   . GERD (gastroesophageal reflux disease)   . HOH (hard of hearing)   . Kidney stones   . Renal disorder   . Wears glasses    Past Surgical History:  Procedure Laterality Date  . BIOPSY  01/22/2021   Procedure: BIOPSY;  Surgeon: Rush Landmark Telford Nab., MD;  Location: [facility];  Service: Gastroenterology;;  . CARPAL TUNNEL RELEASE    . CHOLECYSTECTOMY    . COLONOSCOPY WITH PROPOFOL N/A 02/06/2020   Procedure: COLONOSCOPY WITH PROPOFOL;  Surgeon: Lin Landsman, MD;  Location: [facility] ENDOSCOPY;  Service: Gastroenterology;  Laterality: N/A;  . COLONOSCOPY WITH PROPOFOL N/A 03/31/2020   Procedure: COLONOSCOPY WITH PROPOFOL;  Surgeon: Rush Landmark Telford Nab., MD;  Location: [facility];  Service: Gastroenterology;  Laterality: N/A;  . COLONOSCOPY WITH PROPOFOL N/A 01/22/2021   Procedure: COLONOSCOPY WITH PROPOFOL;  Surgeon: Rush Landmark Telford Nab., MD;  Location: [facility];  Service: Gastroenterology;  Laterality: N/A;  . ENDOSCOPIC MUCOSAL RESECTION N/A 03/31/2020   Procedure: ENDOSCOPIC MUCOSAL RESECTION;  Surgeon: Rush Landmark Telford Nab., MD;  Location: [place];  Service:  Gastroenterology;  Laterality: N/A;  . HEMOSTASIS CLIP PLACEMENT  03/31/2020   Procedure: HEMOSTASIS CLIP PLACEMENT;  Surgeon: Irving Copas., MD;  Location: [place];  Service: Gastroenterology;;  . HERNIA REPAIR    . IR KYPHO LUMBAR INC FX REDUCE BONE BX UNI/BIL CANNULATION INC/IMAGING  02/05/2021  . IR RADIOLOGIST EVAL & MGMT  11/28/2020  . IR RADIOLOGIST EVAL & MGMT  02/26/2021  . POLYPECTOMY  03/31/2020   Procedure: POLYPECTOMY;  Surgeon: Mansouraty, Telford Nab., MD;  Location: [facility];  Service: Gastroenterology;;  . POLYPECTOMY  01/22/2021   Procedure: POLYPECTOMY;  Surgeon: Irving Copas., MD;  Location: [place];  Service: Gastroenterology;;  . SMALL INTESTINE SURGERY    . SUBMUCOSAL LIFTING INJECTION  03/31/2020   Procedure: SUBMUCOSAL LIFTING INJECTION;  Surgeon: Rush Landmark Telford Nab., MD;  Location: [facility] ENDOSCOPY;  Service: Gastroenterology;;   Family History  Problem Relation Age of Onset  . Heart disease Mother 11  . Heart disease Father   . Stroke Father 72  . Diabetes Father   . Heart attack Father   . Liver disease Neg Hx   . Colon cancer Neg Hx   .  Esophageal cancer Neg Hx   . Pancreatic cancer Neg Hx   . Stomach cancer Neg Hx   . Inflammatory bowel disease Neg Hx   . Rectal cancer Neg Hx    Social History   Socioeconomic History  . Marital status: Married    Spouse name: Not on file  . Number of children: Not on file  . Years of education: Graduate Degree  . Highest education level: Master's degree (e.g., MA, MS, MEng, MEd, MSW, MBA)  Occupational History  . Not on file  Tobacco Use  . Smoking status: Former Smoker    Types: Cigarettes    Quit date: 1970    Years since quitting: 52.4  . Smokeless tobacco: Former Systems developer    Types: Snuff  Vaping Use  . Vaping Use: Never used  Substance and Sexual Activity  . Alcohol use: Not Currently  . Drug use: Not Currently    Comment: past  . Sexual activity: Not on file  Other Topics  Concern  . Not on file  Social History Narrative  . Not on file   Social Determinants of Health   Financial Resource Strain: Low Risk   . Difficulty of Paying Living Expenses: Not hard at all  Food Insecurity: No Food Insecurity  . Worried About Charity fundraiser in the Last Year: Never true  . Ran Out of Food in the Last Year: Never true  Transportation Needs: No Transportation Needs  . Lack of Transportation (Medical): No  . Lack of Transportation (Non-Medical): No  Physical Activity: Inactive  . Days of Exercise per Week: 0 days  . Minutes of Exercise per Session: 0 min  Stress: No Stress Concern Present  . Feeling of Stress : Not at all  Social Connections: Not on file    Tobacco Counseling Counseling given: Not Answered   Clinical Intake:  Pre-visit preparation completed: Yes  Pain : 0-10 Pain Score: 7  Pain Type: Acute pain Pain Location: Arm Pain Orientation: Right Pain Descriptors / Indicators: Constant Pain Onset: 1 to 4 weeks ago Pain Frequency: Constant     Nutritional Status: BMI > 30  Obese Nutritional Risks: None Diabetes: Yes  How often do you need to have someone help you when you read instructions, pamphlets, or other written materials from your doctor or pharmacy?: 1 - Never What is the last grade level you completed in school?: masters degree  Diabetic?yes Nutrition Risk Assessment:  Has the patient had any N/V/D within the last 2 months?  No  Does the patient have any non-healing wounds?  No  Has the patient had any unintentional weight loss or weight gain?  No   Diabetes:  Is the patient diabetic?  Yes  If diabetic, was a CBG obtained today?  No  Did the patient bring in their glucometer from home?  No  How often do you monitor your CBG's? Twice daily.   Financial Strains and Diabetes Management:  Are you having any financial strains with the device, your supplies or your medication? No .  Does the patient want to be seen by  Chronic Care Management for management of their diabetes?  No  Would the patient like to be referred to a Nutritionist or for Diabetic Management?  No   Diabetic Exams:  Diabetic Eye Exam: Overdue for diabetic eye exam. Pt has been advised about the importance in completing this exam. Patient advised to call and schedule an eye exam. Diabetic Foot Exam: Completed 08/19/2020  Interpreter Needed?: No  Information entered by :: NAllen LPN   Activities of Daily Living In your present state of health, do you have any difficulty performing the following activities: 04/21/2021 07/16/2020  Hearing? Y N  Comment has ringing in ears -  Vision? N N  Difficulty concentrating or making decisions? N Y  Comment - SLOW TO RESPOND  Walking or climbing stairs? Y Y  Dressing or bathing? Y Y  Comment since breaking arm -  Doing errands, shopping? Y Y  Comment since broken arm -  Preparing Food and eating ? Y -  Using the Toilet? N -  In the past six months, have you accidently leaked urine? Y -  Do you have problems with loss of bowel control? N -  Comment rarely -  Managing your Medications? N -  Managing your Finances? N -  Housekeeping or managing your Housekeeping? N -  Some recent data might be hidden    Patient Care Team: [name], DO as PCP - General (Family Medicine)  Indicate any recent Medical Services you may have received from other than [facility] providers in the past year (date may be approximate).     Assessment:   This is a routine wellness examination for [place].  Hearing/Vision screen  Hearing Screening   125Hz  250Hz  500Hz  1000Hz  2000Hz  3000Hz  4000Hz  6000Hz  8000Hz   Right ear:           Left ear:           Vision Screening Comments: Regular eye exams, [facility]  Dietary issues and exercise activities discussed: Current Exercise Habits: The patient does not participate in regular exercise at present  Goals Addressed            This Visit's Progress   .  Patient Stated       04/21/2021, wants to find out what is wrong with speech      Depression Screen PHQ 2/9 Scores 04/21/2021 03/18/2020 02/11/2020 10/08/2019 07/04/2019 07/04/2019 05/30/2019  PHQ - 2 Score 6 0 0 0 2 2 0  PHQ- 9 Score 16 - - - 6 6 -    Fall Risk Fall Risk  04/21/2021 11/24/2020 08/19/2020 03/18/2020 02/11/2020  Falls in the past year? 1 1 0 0 0  Number falls in past yr: 1 1 0 0 0  Injury with Fall? 1 1 0 0 0  Comment broken arm - - - -  Risk for fall due to : Impaired balance/gait;Medication side effect;Impaired mobility History of fall(s) - - -  Follow up Falls evaluation completed;Education provided;Falls prevention discussed - Falls evaluation completed - Falls evaluation completed    FALL RISK PREVENTION PERTAINING TO THE HOME:  Any stairs in or around the home? Yes  If so, are there any without handrails? No  Home free of loose throw rugs in walkways, pet beds, electrical cords, etc? Yes  Adequate lighting in your home to reduce risk of falls? Yes   ASSISTIVE DEVICES UTILIZED TO PREVENT FALLS:  Life alert? No  Use of a cane, walker or w/c? Yes  Grab bars in the bathroom? Yes  Shower chair or bench in shower? Yes  Elevated toilet seat or a handicapped toilet? Yes   TIMED UP AND GO:  Was the test performed? No   Cognitive Function:     6CIT Screen 04/21/2021  What Year? 0 points  What month? 0 points  What time? 0 points  Count back from 20 0 points  Months  in reverse 0 points  Repeat phrase 0 points  Total Score 0    Immunizations Immunization History  Administered Date(s) Administered  . Fluad Quad(high Dose 65+) 09/04/2019, 08/19/2020  . Influenza, High Dose Seasonal PF 09/08/2017  . Pneumococcal Conjugate-13 08/19/2020    TDAP status: Due, Education has been provided regarding the importance of this vaccine. Advised may receive this vaccine at local pharmacy or Health Dept. Aware to provide a copy of the vaccination record if obtained from local  pharmacy or Health Dept. Verbalized acceptance and understanding.  Flu Vaccine status: Up to date  Pneumococcal vaccine status: Up to date  Covid-19 vaccine status: Declined, Education has been provided regarding the importance of this vaccine but patient still declined. Advised may receive this vaccine at local pharmacy or Health Dept.or vaccine clinic. Aware to provide a copy of the vaccination record if obtained from local pharmacy or Health Dept. Verbalized acceptance and understanding.  Qualifies for Shingles Vaccine? Yes   Zostavax completed No   Shingrix Completed?: No.    Education has been provided regarding the importance of this vaccine. Patient has been advised to call insurance company to determine out of pocket expense if they have not yet received this vaccine. Advised may also receive vaccine at local pharmacy or Health Dept. Verbalized acceptance and understanding.  Screening Tests Health Maintenance  Topic Date Due  . Pneumococcal Vaccine 61-67 Years old (1 of 4 - PCV13) Never done  . COVID-19 Vaccine (1) Never done  . TETANUS/TDAP  Never done  . Zoster Vaccines- Shingrix (1 of 2) Never done  . OPHTHALMOLOGY EXAM  04/03/2021  . INFLUENZA VACCINE  06/15/2021  . FOOT EXAM  08/19/2021  . PNA vac Low Risk Adult (2 of 2 - PPSV23) 08/19/2021  . HEMOGLOBIN A1C  08/26/2021  . COLONOSCOPY (Pts 45-80yrs Insurance coverage will need to be confirmed)  01/22/2022  . Hepatitis C Screening  Completed  . HPV VACCINES  Aged Out    Health Maintenance  Health Maintenance Due  Topic Date Due  . Pneumococcal Vaccine 86-5 Years old (1 of 4 - PCV13) Never done  . COVID-19 Vaccine (1) Never done  . TETANUS/TDAP  Never done  . Zoster Vaccines- Shingrix (1 of 2) Never done  . OPHTHALMOLOGY EXAM  04/03/2021    Colorectal cancer screening: Type of screening: Colonoscopy. Completed 01/22/2021. Repeat every 3 years  Lung Cancer Screening: (Low Dose CT Chest recommended if Age 34-80 years,  30 pack-year currently smoking OR have quit w/in 15years.) does not qualify.   Lung Cancer Screening Referral: no  Additional Screening:  Hepatitis C Screening: does qualify; Completed 08/12/2020  Vision Screening: Recommended annual ophthalmology exams for early detection of glaucoma and other disorders of the eye. Is the patient up to date with their annual eye exam?  No  Who is the provider or what is the name of the office in which the patient attends annual eye exams? [facility] If pt is not established with a provider, would they like to be referred to a provider to establish care? No .   Dental Screening: Recommended annual dental exams for proper oral hygiene  Community Resource Referral / Chronic Care Management: CRR required this visit?  No   CCM required this visit?  No      Plan:     I have personally reviewed and noted the following in the patient's chart:   . Medical and social history . Use of alcohol, tobacco or illicit drugs  .  Current medications and supplements including opioid prescriptions. Patient is currently taking opioid prescriptions. Information provided to patient regarding non-opioid alternatives. Patient advised to discuss non-opioid treatment plan with their provider. . Functional ability and status . Nutritional status . Physical activity . Advanced directives . List of other physicians . Hospitalizations, surgeries, and ER visits in previous 12 months . Vitals . Screenings to include cognitive, depression, and falls . Referrals and appointments  In addition, I have reviewed and discussed with patient certain preventive protocols, quality metrics, and best practice recommendations. A written personalized care plan for preventive services as well as general preventive health recommendations were provided to patient.     Kellie Simmering, LPN   04/16/3761   Nurse Notes:

## 2021-04-21 NOTE — Patient Instructions (Signed)
Jonathan Shelton , Thank you for taking time to come for your Medicare Wellness Visit. I appreciate your ongoing commitment to your health goals. Please review the following plan we discussed and let me know if I can assist you in the future.   Screening recommendations/referrals: Colonoscopy: completed 01/22/2021, due 01/23/2024 Recommended yearly ophthalmology/optometry visit for glaucoma screening and checkup Recommended yearly dental visit for hygiene and checkup  Vaccinations: Influenza vaccine: completed 08/19/2020, due 06/15/2021 Pneumococcal vaccine: completed 08/19/2020 Tdap vaccine: due Shingles vaccine: discussed   Covid-19:  decline  Advanced directives: Please bring a copy of your POA (Power of Attorney) and/or Living Will to your next appointment.   Conditions/risks identified: none  Next appointment: Follow up in one year for your annual wellness visit.   Preventive Care 32 Years and Older, Male Preventive care refers to lifestyle choices and visits with your health care provider that can promote health and wellness. What does preventive care include?  A yearly physical exam. This is also called an annual well check.  Dental exams once or twice a year.  Routine eye exams. Ask your health care provider how often you should have your eyes checked.  Personal lifestyle choices, including:  Daily care of your teeth and gums.  Regular physical activity.  Eating a healthy diet.  Avoiding tobacco and drug use.  Limiting alcohol use.  Practicing safe sex.  Taking low doses of aspirin every day.  Taking vitamin and mineral supplements as recommended by your health care provider. What happens during an annual well check? The services and screenings done by your health care provider during your annual well check will depend on your age, overall health, lifestyle risk factors, and family history of disease. Counseling  Your health care provider may ask you questions about  your:  Alcohol use.  Tobacco use.  Drug use.  Emotional well-being.  Home and relationship well-being.  Sexual activity.  Eating habits.  History of falls.  Memory and ability to understand (cognition).  Work and work Statistician. Screening  You may have the following tests or measurements:  Height, weight, and BMI.  Blood pressure.  Lipid and cholesterol levels. These may be checked every 5 years, or more frequently if you are over 4 years old.  Skin check.  Lung cancer screening. You may have this screening every year starting at age 90 if you have a 30-pack-year history of smoking and currently smoke or have quit within the past 15 years.  Fecal occult blood test (FOBT) of the stool. You may have this test every year starting at age 94.  Flexible sigmoidoscopy or colonoscopy. You may have a sigmoidoscopy every 5 years or a colonoscopy every 10 years starting at age 16.  Prostate cancer screening. Recommendations will vary depending on your family history and other risks.  Hepatitis C blood test.  Hepatitis B blood test.  Sexually transmitted disease (STD) testing.  Diabetes screening. This is done by checking your blood sugar (glucose) after you have not eaten for a while (fasting). You may have this done every 1-3 years.  Abdominal aortic aneurysm (AAA) screening. You may need this if you are a current or former smoker.  Osteoporosis. You may be screened starting at age 90 if you are at high risk. Talk with your health care provider about your test results, treatment options, and if necessary, the need for more tests. Vaccines  Your health care provider may recommend certain vaccines, such as:  Influenza vaccine. This is recommended every  year.  Tetanus, diphtheria, and acellular pertussis (Tdap, Td) vaccine. You may need a Td booster every 10 years.  Zoster vaccine. You may need this after age 5.  Pneumococcal 13-valent conjugate (PCV13) vaccine.  One dose is recommended after age 79.  Pneumococcal polysaccharide (PPSV23) vaccine. One dose is recommended after age 78. Talk to your health care provider about which screenings and vaccines you need and how often you need them. This information is not intended to replace advice given to you by your health care provider. Make sure you discuss any questions you have with your health care provider. Document Released: 11/28/2015 Document Revised: 07/21/2016 Document Reviewed: 09/02/2015 Elsevier Interactive Patient Education  2017 [place] Prevention in the Home Falls can cause injuries. They can happen to people of all ages. There are many things you can do to make your home safe and to help prevent falls. What can I do on the outside of my home?  Regularly fix the edges of walkways and driveways and fix any cracks.  Remove anything that might make you trip as you walk through a door, such as a raised step or threshold.  Trim any bushes or trees on the path to your home.  Use bright outdoor lighting.  Clear any walking paths of anything that might make someone trip, such as rocks or tools.  Regularly check to see if handrails are loose or broken. Make sure that both sides of any steps have handrails.  Any raised decks and porches should have guardrails on the edges.  Have any leaves, snow, or ice cleared regularly.  Use sand or salt on walking paths during winter.  Clean up any spills in your garage right away. This includes oil or grease spills. What can I do in the bathroom?  Use night lights.  Install grab bars by the toilet and in the tub and shower. Do not use towel bars as grab bars.  Use non-skid mats or decals in the tub or shower.  If you need to sit down in the shower, use a plastic, non-slip stool.  Keep the floor dry. Clean up any water that spills on the floor as soon as it happens.  Remove soap buildup in the tub or shower regularly.  Attach bath  mats securely with double-sided non-slip rug tape.  Do not have throw rugs and other things on the floor that can make you trip. What can I do in the bedroom?  Use night lights.  Make sure that you have a light by your bed that is easy to reach.  Do not use any sheets or blankets that are too big for your bed. They should not hang down onto the floor.  Have a firm chair that has side arms. You can use this for support while you get dressed.  Do not have throw rugs and other things on the floor that can make you trip. What can I do in the kitchen?  Clean up any spills right away.  Avoid walking on wet floors.  Keep items that you use a lot in easy-to-reach places.  If you need to reach something above you, use a strong step stool that has a grab bar.  Keep electrical cords out of the way.  Do not use floor polish or wax that makes floors slippery. If you must use wax, use non-skid floor wax.  Do not have throw rugs and other things on the floor that can make you trip. What can  I do with my stairs?  Do not leave any items on the stairs.  Make sure that there are handrails on both sides of the stairs and use them. Fix handrails that are broken or loose. Make sure that handrails are as long as the stairways.  Check any carpeting to make sure that it is firmly attached to the stairs. Fix any carpet that is loose or worn.  Avoid having throw rugs at the top or bottom of the stairs. If you do have throw rugs, attach them to the floor with carpet tape.  Make sure that you have a light switch at the top of the stairs and the bottom of the stairs. If you do not have them, ask someone to add them for you. What else can I do to help prevent falls?  Wear shoes that:  Do not have high heels.  Have rubber bottoms.  Are comfortable and fit you well.  Are closed at the toe. Do not wear sandals.  If you use a stepladder:  Make sure that it is fully opened. Do not climb a closed  stepladder.  Make sure that both sides of the stepladder are locked into place.  Ask someone to hold it for you, if possible.  Clearly mark and make sure that you can see:  Any grab bars or handrails.  First and last steps.  Where the edge of each step is.  Use tools that help you move around (mobility aids) if they are needed. These include:  Canes.  Walkers.  Scooters.  Crutches.  Turn on the lights when you go into a dark area. Replace any light bulbs as soon as they burn out.  Set up your furniture so you have a clear path. Avoid moving your furniture around.  If any of your floors are uneven, fix them.  If there are any pets around you, be aware of where they are.  Review your medicines with your doctor. Some medicines can make you feel dizzy. This can increase your chance of falling. Ask your doctor what other things that you can do to help prevent falls. This information is not intended to replace advice given to you by your health care provider. Make sure you discuss any questions you have with your health care provider. Document Released: 08/28/2009 Document Revised: 04/08/2016 Document Reviewed: 12/06/2014 Elsevier Interactive Patient Education  2017 [employer].

## 2021-04-23 DIAGNOSIS — Z7984 Long term (current) use of oral hypoglycemic drugs: Secondary | ICD-10-CM | POA: Insufficient documentation

## 2021-04-24 DIAGNOSIS — Z794 Long term (current) use of insulin: Secondary | ICD-10-CM | POA: Insufficient documentation

## 2021-04-28 ENCOUNTER — Other Ambulatory Visit: Payer: Self-pay

## 2021-04-28 DIAGNOSIS — E785 Hyperlipidemia, unspecified: Secondary | ICD-10-CM

## 2021-04-28 DIAGNOSIS — E1169 Type 2 diabetes mellitus with other specified complication: Secondary | ICD-10-CM

## 2021-04-28 MED ORDER — ATORVASTATIN CALCIUM 10 MG PO TABS: 10.0000 mg | ORAL_TABLET | ORAL | 2 refills | Status: DC

## 2021-05-08 ENCOUNTER — Other Ambulatory Visit: Payer: Self-pay

## 2021-05-08 DIAGNOSIS — E1169 Type 2 diabetes mellitus with other specified complication: Secondary | ICD-10-CM

## 2021-05-08 DIAGNOSIS — Z794 Long term (current) use of insulin: Secondary | ICD-10-CM

## 2021-05-08 MED ORDER — METFORMIN HCL 1000 MG PO TABS: 1000.0000 mg | ORAL_TABLET | Freq: Two times a day (BID) | ORAL | 0 refills | Status: DC

## 2021-05-13 ENCOUNTER — Other Ambulatory Visit: Payer: Self-pay | Admitting: Neurology

## 2021-05-13 DIAGNOSIS — G463 Brain stem stroke syndrome: Secondary | ICD-10-CM

## 2021-05-21 ENCOUNTER — Other Ambulatory Visit: Payer: Self-pay | Admitting: Family Medicine

## 2021-05-21 DIAGNOSIS — R3914 Feeling of incomplete bladder emptying: Secondary | ICD-10-CM

## 2021-05-21 DIAGNOSIS — N401 Enlarged prostate with lower urinary tract symptoms: Secondary | ICD-10-CM

## 2021-05-25 ENCOUNTER — Ambulatory Visit
Admission: RE | Admit: 2021-05-25 | Discharge: 2021-05-25 | Disposition: A | Discharge status: Home / Self Care | Payer: Medicare HMO | Source: Ambulatory Visit | Attending: Neurology | Admitting: Neurology

## 2021-05-25 ENCOUNTER — Other Ambulatory Visit: Payer: Self-pay

## 2021-05-25 DIAGNOSIS — G463 Brain stem stroke syndrome: Secondary | ICD-10-CM | POA: Diagnosis not present

## 2021-05-25 MED ORDER — GADOBUTROL 1 MMOL/ML IV SOLN
10.0000 mL | Freq: Once | INTRAVENOUS | Status: AC | PRN
  Administered 2021-05-25: 10 mL via INTRAVENOUS

## 2021-05-26 ENCOUNTER — Other Ambulatory Visit: Payer: Medicare HMO

## 2021-05-26 ENCOUNTER — Other Ambulatory Visit: Payer: Self-pay

## 2021-05-26 DIAGNOSIS — E1169 Type 2 diabetes mellitus with other specified complication: Secondary | ICD-10-CM

## 2021-05-26 DIAGNOSIS — Z8616 Personal history of COVID-19: Secondary | ICD-10-CM

## 2021-05-26 DIAGNOSIS — E785 Hyperlipidemia, unspecified: Secondary | ICD-10-CM

## 2021-05-26 DIAGNOSIS — Z794 Long term (current) use of insulin: Secondary | ICD-10-CM

## 2021-05-27 ENCOUNTER — Other Ambulatory Visit: Payer: Self-pay

## 2021-05-27 LAB — BASIC METABOLIC PANEL WITH GFR
BUN: 15 mg/dL (ref 7–25)
CO2: 24 mmol/L (ref 20–32)
Calcium: 9.2 mg/dL (ref 8.6–10.3)
Chloride: 108 mmol/L (ref 98–110)
Creat: 0.79 mg/dL (ref 0.70–1.35)
Glucose, Bld: 108 mg/dL (ref 65–139)
Potassium: 4.2 mmol/L (ref 3.5–5.3)
Sodium: 141 mmol/L (ref 135–146)
eGFR: 96 mL/min/{1.73_m2} (ref >=60)

## 2021-05-27 LAB — HEMOGLOBIN A1C
Hgb A1c MFr Bld: 6.9 % of total Hgb — ABNORMAL HIGH (ref <5.7)
Mean Plasma Glucose: 151 mg/dL
eAG (mmol/L): 8.4 mmol/L

## 2021-06-01 LAB — SARS-COV-2 SEMI-QUANTITATIVE TOTAL ANTIBODY, SPIKE: SARS COV2 AB, Total Spike Semi QN: 1416 U/mL — ABNORMAL HIGH (ref <0.8)

## 2021-06-02 ENCOUNTER — Encounter: Payer: Self-pay | Admitting: Family Medicine

## 2021-06-02 ENCOUNTER — Other Ambulatory Visit: Payer: Self-pay | Admitting: Family Medicine

## 2021-06-02 ENCOUNTER — Other Ambulatory Visit: Payer: Self-pay

## 2021-06-02 ENCOUNTER — Ambulatory Visit: Payer: Medicare HMO | Admitting: Family Medicine

## 2021-06-02 VITALS — BP 115/67 | HR 72 | Ht 70.0 in | Wt 233.6 lb

## 2021-06-02 DIAGNOSIS — Z794 Long term (current) use of insulin: Secondary | ICD-10-CM | POA: Diagnosis not present

## 2021-06-02 DIAGNOSIS — G720 Drug-induced myopathy: Secondary | ICD-10-CM

## 2021-06-02 DIAGNOSIS — E1169 Type 2 diabetes mellitus with other specified complication: Secondary | ICD-10-CM

## 2021-06-02 DIAGNOSIS — I6930 Unspecified sequelae of cerebral infarction: Secondary | ICD-10-CM

## 2021-06-02 DIAGNOSIS — E785 Hyperlipidemia, unspecified: Secondary | ICD-10-CM

## 2021-06-02 MED ORDER — REPATHA SURECLICK 140 MG/ML ~~LOC~~ SOAJ: 140.0000 mg | SUBCUTANEOUS | 3 refills | Status: DC

## 2021-06-02 NOTE — Progress Notes (Signed)
Subjective:    Patient ID: Jonathan Shelton, male    DOB: 03-04-1951, 70 y.o.   MRN: [id number]  Jonathan Shelton is a 70 y.o. male presenting on 06/02/2021 for Diabetes  Here with wife.  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  Reports Statin use for >20 years, with prior trials on Lipitor, Atorvastatin, Simvastatin past. He still has issues with fall and balance problem.  CHRONIC HTN: Reports controlled BP Current Meds - Valsartan 80mg  daily   Reports good compliance, took meds today. Tolerating well, w/o complaints. Denies CP, dyspnea, HA, edema, dizziness / lightheadedness  CHRONIC DM, Type 2: A1c previously 7.0, and now recent result 6.9 CBGs: variable ranges, AM sugar lower in 100-150 range. In past he had results < 100 at times Meds: Novolin-N 40 units nightly, Ozempic 1mg  weekly, Jardiance 25mg  daily, Metformin 1000mg  BID Reports good compliance. Tolerating well w/o side-effects Now he says Ozempic no longer curbing appetite wants to change to Trulicity Currently on ARB Next [facility] Eye apt coming up. Dr Ellin Mayhew Denies hypoglycemia, polyuria, visual changes, numbness or tingling   Health Maintenance: He has not received any COVID vaccine. He is considering vaccine. He has antibody level tested recently result is 1,400  Depression screen [facility] 2/9 06/02/2021 04/21/2021 03/18/2020  Decreased Interest 2 3 0  Down, Depressed, Hopeless 0 3 0  PHQ - 2 Score 2 6 0  Altered sleeping 2 1 -  Tired, decreased energy 3 3 -  Change in appetite 1 0 -  Feeling bad or failure about yourself  0 3 -  Trouble concentrating 3 0 -  Moving slowly or fidgety/restless 0 3 -  Suicidal thoughts 0 0 -  PHQ-9 Score 11 16 -  Difficult doing  work/chores Not difficult at all Very difficult -    Social History   Tobacco Use   Smoking status: Former    Types: Cigarettes    Quit date: 1970    Years since quitting: 52.5   Smokeless tobacco: Former    Types: Snuff  Vaping Use   Vaping Use: Never used  Substance Use Topics   Alcohol use: Not Currently   Drug use: Not Currently    Comment: past    Review of Systems Per HPI unless specifically indicated above     Objective:    BP 115/67   Pulse 72   Ht 5\' 10"  (1.778 m)   Wt 233 lb 9.6 oz (106 kg)   SpO2 99%   BMI 33.52 kg/m   Wt Readings from Last 3 Encounters:  06/02/21 233 lb 9.6 oz (106 kg)  05/25/21 235 lb (106.6 kg)  04/21/21 230 lb (104.3 kg)    Physical Exam Vitals and nursing note reviewed.  Constitutional:      General: He is not in acute distress.    Appearance: He is well-developed. He is not diaphoretic.     Comments: Well-appearing, comfortable, cooperative  HENT:     Head: Normocephalic and atraumatic.  Eyes:     General:        Right eye: No discharge.        Left eye: No discharge.     Conjunctiva/sclera: Conjunctivae normal.  Neck:     Thyroid: No  thyromegaly.  Cardiovascular:     Rate and Rhythm: Normal rate and regular rhythm.     Pulses: Normal pulses.     Heart sounds: Normal heart sounds. No murmur heard. Pulmonary:     Effort: Pulmonary effort is normal. No respiratory distress.     Breath sounds: Normal breath sounds. No wheezing or rales.  Musculoskeletal:        General: Normal range of motion.     Cervical back: Normal range of motion and neck supple.  Lymphadenopathy:     Cervical: No cervical adenopathy.  Skin:    General: Skin is warm and dry.     Findings: No erythema or rash.  Neurological:     Mental Status: He is alert and oriented to person, place, and time. Mental status is at baseline.  Psychiatric:        Behavior: Behavior normal.     Comments: Well groomed, good eye contact, normal speech and thoughts     Narrative & Impression  CLINICAL DATA:  Provided history: Brainstem stroke syndrome. Additional history provided by scanning technologist: Patient reports 8 months of difficulty speaking, balance difficulty.   EXAM: MRI HEAD WITHOUT AND WITH CONTRAST   TECHNIQUE: Multiplanar, multiecho pulse sequences of the brain and surrounding structures were obtained without and with intravenous contrast.   CONTRAST:  76mL GADAVIST GADOBUTROL 1 MMOL/ML IV SOLN   COMPARISON:  No pertinent prior exams available for comparison.   FINDINGS: Brain:   Mild generalized cerebral atrophy.   No cortical encephalomalacia is identified. No significant matter disease for age. No acute or remote infarct is identified within the brainstem.   Two tiny chronic lacunar infarcts within the right cerebellar hemisphere (series 10, images 5 and 7).   There is no acute infarct.   No evidence of an intracranial mass.   No chronic intracranial blood products.   No extra-axial fluid collection.   No midline shift.   No abnormal intracranial enhancement.   Vascular: Expected proximal arterial flow voids.   Skull and upper cervical spine: No focal marrow lesion.   Sinuses/Orbits: Visualized orbits show no acute finding. Bilateral lens replacements. Mild mucosal thickening and small mucous retention cysts within the inferior maxillary sinuses bilaterally.   IMPRESSION: No evidence of acute intracranial abnormality.   No acute or remote brainstem infarct is identified.   Two tiny chronic lacunar infarcts within the right cerebellar hemisphere.   Mild generalized cerebral atrophy.   Bilateral maxillary sinus disease, as described.     Electronically Signed   By: Kellie Simmering DO   On: 05/26/2021 09:31     Results for orders placed or performed in visit on 05/26/21  SARS-CoV-2 Semi-Quantitative Total Antibody, Spike  Result Value Ref Range   SARS COV2 AB, Total Spike Semi QN 1,416.0 (H)  <0.8 U/mL      Assessment & Plan:   Problem List Items Addressed This Visit     Hyperlipidemia associated with type 2 diabetes mellitus ([place])   Relevant Medications   REPATHA SURECLICK 409 MG/ML SOAJ   OZEMPIC, 1 MG/DOSE, 4 MG/3ML SOPN   History of cerebrovascular accident (CVA) with residual deficit    Followed by [facility] Dr Manuella Ghazi On MRI see results On medication management Followed by Neuro for balance as well Off Statin, self discontinued myopathy, start PCSK9       Relevant Medications   REPATHA SURECLICK 811 MG/ML SOAJ   Drug-induced myopathy    Myopathy due to drug from atorvastatin,  Statin therapy myopathy Failed Atorvastatin, Lipitor, Simvastatin Start Repatha q 2 week, as indicated for reduce future cardiovascular risk given CVA       Relevant Medications   REPATHA SURECLICK 461 MG/ML SOAJ   Diabetes mellitus ([place]) - Primary    A1c today 6.9 improved Complications - some early peripheral neuropathy, obesity - increases risk of future cardiovascular complications  Never started Trulicity  Plan:  1. Continue Ozempic 1 mg weekly Adjust Insulin as needed - Continue Metformin 1000mg  BID - Continue Jardiance 25mg  daily - he says medication helps him urinate 3 Encourage improved lifestyle - low carb, low sugar diet, reduce portion size, continue improving regular exercise 4 Check CBG, bring log to next visit for review 5. Continue ASA, ARB, OFF Statin now start PCSK9       Relevant Medications   REPATHA SURECLICK 901 MG/ML SOAJ   OZEMPIC, 1 MG/DOSE, 4 MG/3ML SOPN       Meds ordered this encounter  Medications   REPATHA SURECLICK 222 MG/ML SOAJ    Sig: Inject 140 mg into the skin every 14 (fourteen) days.    Dispense:  6 mL    Refill:  3       Follow up plan: Return in about 3 months (around 09/02/2021) for 3 month fasting lab only then 1 week later Annual Physical.  Future labs to be ordered  Nobie Putnam, [facility] 06/02/2021, 1:53 PM

## 2021-06-02 NOTE — Patient Instructions (Addendum)
Thank you for coming to the office today.  Trial on Repatha injection every 2 weeks for cholesterol and stroke risk reduction  Stop taking the Atorvastatin. Due to side effect.  Let me know if want to adjust Ozempic dose further, please confirm your current dose 1mg  weekly and we can consider 2 mg weekly dose if need, OR switch to Trulicity if preferred.  Recent Labs    11/26/20 0757 02/24/21 1712 05/26/21 0856  HGBA1C 7.0* 7.0* 6.9*   DUE for FASTING BLOOD WORK (no food or drink after midnight before the lab appointment, only water or coffee without cream/sugar on the morning of)  SCHEDULE "Lab Only" visit in the morning at the clinic for lab draw in 3 MONTHS   - Make sure Lab Only appointment is at about 1 week before your next appointment, so that results will be available  For Lab Results, once available within 2-3 days of blood draw, you can can log in to MyChart online to view your results and a brief explanation. Also, we can discuss results at next follow-up visit.   Please schedule a Follow-up Appointment to: Return in about 3 months (around 09/02/2021) for 3 month fasting lab only then 1 week later Annual Physical.  If you have any other questions or concerns, please feel free to call the office or send a message through [place]. You may also schedule an earlier appointment if necessary.  Additionally, you may be receiving a survey about your experience at our office within a few days to 1 week by e-mail or mail. We value your feedback.  Nobie Putnam, DO [place]

## 2021-06-02 NOTE — Assessment & Plan Note (Signed)
Myopathy due to drug from atorvastatin, Statin therapy myopathy Failed Atorvastatin, Lipitor, Simvastatin Start Repatha q 2 week, as indicated for reduce future cardiovascular risk given CVA

## 2021-06-02 NOTE — Assessment & Plan Note (Signed)
Followed by [facility] Neuro Dr Manuella Ghazi On MRI see results On medication management Followed by Neuro for balance as well Off Statin, self discontinued myopathy, start PCSK9

## 2021-06-02 NOTE — Assessment & Plan Note (Addendum)
A1c today 6.9 improved Complications - some early peripheral neuropathy, obesity - increases risk of future cardiovascular complications  Never started Trulicity  Plan:  1. Continue Ozempic 1 mg weekly Adjust Insulin as needed - Continue Metformin 1000mg  BID - Continue Jardiance 25mg  daily - he says medication helps him urinate 3 Encourage improved lifestyle - low carb, low sugar diet, reduce portion size, continue improving regular exercise 4 Check CBG, bring log to next visit for review 5. Continue ASA, ARB, OFF Statin now start PCSK9

## 2021-06-03 ENCOUNTER — Other Ambulatory Visit: Payer: Self-pay | Admitting: Family Medicine

## 2021-06-03 DIAGNOSIS — G720 Drug-induced myopathy: Secondary | ICD-10-CM

## 2021-06-03 DIAGNOSIS — I6930 Unspecified sequelae of cerebral infarction: Secondary | ICD-10-CM

## 2021-06-03 DIAGNOSIS — E1169 Type 2 diabetes mellitus with other specified complication: Secondary | ICD-10-CM

## 2021-06-03 MED ORDER — PRALUENT 75 MG/ML ~~LOC~~ SOAJ: 75.0000 mg | SUBCUTANEOUS | 3 refills | Status: DC

## 2021-06-27 ENCOUNTER — Other Ambulatory Visit: Payer: Self-pay | Admitting: Family Medicine

## 2021-06-27 DIAGNOSIS — I10 Essential (primary) hypertension: Secondary | ICD-10-CM

## 2021-06-28 NOTE — Telephone Encounter (Signed)
Requested Prescriptions  Pending Prescriptions Disp Refills  . valsartan (DIOVAN) 80 MG tablet [Pharmacy Med Name: VALSARTAN 80 MG TABLET] 90 tablet 3    Sig: TAKE 1 TABLET BY MOUTH EVERY DAY     Cardiovascular:  Angiotensin Receptor Blockers Passed - 06/27/2021  7:55 PM      Passed - Cr in normal range and within 180 days    Creat  Date Value Ref Range Status  05/26/2021 0.79 0.70 - 1.35 mg/dL Final         Passed - K in normal range and within 180 days    Potassium  Date Value Ref Range Status  05/26/2021 4.2 3.5 - 5.3 mmol/L Final         Passed - Patient is not pregnant      Passed - Last BP in normal range    BP Readings from Last 1 Encounters:  06/02/21 115/67         Passed - Valid encounter within last 6 months    Recent Outpatient Visits          3 weeks ago Type 2 diabetes mellitus with other specified complication, with long-term current use of insulin ([place])   [facility], Devonne Doughty, DO   4 months ago Type 2 diabetes mellitus with other specified complication, with long-term current use of insulin [facility])   [place], DO   7 months ago Type 2 diabetes mellitus with other specified complication, with long-term current use of insulin [facility])   [facility] [place], Devonne Doughty, DO   10 months ago Annual physical exam   [facility] [name], DO   10 months ago [place], Devonne Doughty, DO      Future Appointments            In 10 months [facility], [facility]

## 2021-07-21 ENCOUNTER — Encounter: Payer: Self-pay | Admitting: Family Medicine

## 2021-07-22 ENCOUNTER — Telehealth (INDEPENDENT_AMBULATORY_CARE_PROVIDER_SITE_OTHER): Payer: Medicare HMO | Admitting: Family Medicine

## 2021-07-22 ENCOUNTER — Encounter: Payer: Self-pay | Admitting: Family Medicine

## 2021-07-22 VITALS — Wt 233.0 lb

## 2021-07-22 DIAGNOSIS — U071 COVID-19: Secondary | ICD-10-CM

## 2021-07-22 MED ORDER — NIRMATRELVIR/RITONAVIR (PAXLOVID)TABLET: 3.0000 | ORAL_TABLET | Freq: Two times a day (BID) | ORAL | 0 refills | Status: AC

## 2021-07-22 NOTE — Patient Instructions (Addendum)
   Please schedule a Follow-up Appointment to: Return if symptoms worsen or fail to improve.  If you have any other questions or concerns, please feel free to call the office or send a message through MyChart. You may also schedule an earlier appointment if necessary.  Additionally, you may be receiving a survey about your experience at our office within a few days to 1 week by e-mail or mail. We value your feedback.  Audry Pecina, DO [facility], [facility] 

## 2021-07-22 NOTE — Progress Notes (Signed)
Subjective:    Patient ID: Jonathan Shelton, male    DOB: 1951-03-10, 70 y.o.   MRN: [id number]  Jonathan Shelton is a 70 y.o. male presenting on 07/22/2021 for Covid Positive  Virtual / Telehealth Encounter - Video Visit via [facility] The purpose of this virtual visit is to provide medical care while limiting exposure to the novel coronavirus (COVID19) for both patient and office staff.  Consent was obtained for remote visit:  Yes.   Answered questions that patient had about telehealth interaction:  Yes.   I discussed the limitations, risks, security and privacy concerns of performing an evaluation and management service by video/telephone. I also discussed with the patient that there may be a patient responsible charge related to this service. The patient expressed understanding and agreed to proceed.  Patient Location: Home Provider Location: [facility] (Office)  Participants in virtual visit: - Patient: Jonathan Shelton - CMA: Orinda Kenner, CMA - Provider: Dr Parks Ranger   HPI  COVID19 infection Recent onset symptoms Sunday 9/4, tested positive Monday 9/5 He has cough, body aches, fatigue He has history of prior covid from 2019 Unvaccinated   Depression screen [facility] 2/9 06/02/2021 04/21/2021 03/18/2020  Decreased Interest 2 3 0  Down, Depressed, Hopeless 0 3 0  PHQ - 2 Score 2 6 0  Altered sleeping 2 1 -  Tired, decreased energy 3 3 -  Change in appetite 1 0 -  Feeling bad or failure about yourself  0 3 -  Trouble concentrating 3 0 -  Moving slowly or fidgety/restless 0 3 -  Suicidal thoughts 0 0 -  PHQ-9 Score 11 16 -  Difficult doing work/chores Not difficult at all Very difficult -    Social History   Tobacco Use   Smoking status: Former    Types: Cigarettes    Quit date: 1970    Years since quitting: 52.7   Smokeless tobacco: Former    Types: Snuff  Vaping Use   Vaping Use: Never used  Substance Use Topics   Alcohol use: Not Currently    Drug use: Not Currently    Comment: past    Review of Systems Per HPI unless specifically indicated above     Objective:    Wt 233 lb (105.7 kg)   BMI 33.43 kg/m   Wt Readings from Last 3 Encounters:  07/22/21 233 lb (105.7 kg)  06/02/21 233 lb 9.6 oz (106 kg)  05/25/21 235 lb (106.6 kg)    Physical Exam  Note examination was completely remotely via video observation objective data only  Gen - well-appearing, no acute distress or apparent pain, comfortable HEENT - eyes appear clear without discharge or redness Heart/Lungs - cannot examine virtually - observed no evidence of coughing or labored breathing. Abd - cannot examine virtually  Skin - face visible today- no rash Neuro - awake, alert, oriented Psych - not anxious appearing  Results for orders placed or performed in visit on 05/26/21  SARS-CoV-2 Semi-Quantitative Total Antibody, Spike  Result Value Ref Range   SARS COV2 AB, Total Spike Semi QN 1,416.0 (H) <0.8 U/mL      Assessment & Plan:   Problem List Items Addressed This Visit   None Visit Diagnoses     COVID-19 virus infection    -  Primary   Relevant Medications   nirmatrelvir/ritonavir EUA (PAXLOVID) 20 x 150 MG & 10 x '100MG'$  TABS      COVID19 positive Symptom 1st onset  9/4 Confirm home test positive 9/5 History COVID Mild to moderate symptoms currently. No red flags or dyspnea Risk factor age 89, chronic health conditions   Start Paxlovid, GFR >60, 5 day course as prescribed, regular dosing with normal kidney function GFR >60. Reviewed med interaction checker - HOLD Tamsulosin for 5 days while on med Counseling provided Supportive care OTC PRN Follow-up criteria given.  Advised on repeat testing   Meds ordered this encounter  Medications   nirmatrelvir/ritonavir EUA (PAXLOVID) 20 x 150 MG & 10 x '100MG'$  TABS    Sig: Take 3 tablets by mouth 2 (two) times daily for 5 days. (Take nirmatrelvir 150 mg two tablets twice daily for 5 days and  ritonavir 100 mg one tablet twice daily for 5 days) Patient GFR is >60    Dispense:  30 tablet    Refill:  0      Follow up plan: Return if symptoms worsen or fail to improve.   Patient verbalizes understanding with the above medical recommendations including the limitation of remote medical advice.  Specific follow-up and call-back criteria were given for patient to follow-up or seek medical care more urgently if needed.  Total duration of direct patient care provided via video conference: 15 minutes    Nobie Putnam, [facility] 07/22/2021, 4:53 PM

## 2021-08-06 ENCOUNTER — Other Ambulatory Visit: Payer: Self-pay | Admitting: Family Medicine

## 2021-08-06 DIAGNOSIS — Z794 Long term (current) use of insulin: Secondary | ICD-10-CM

## 2021-08-06 DIAGNOSIS — E1169 Type 2 diabetes mellitus with other specified complication: Secondary | ICD-10-CM

## 2021-08-06 MED ORDER — OZEMPIC (1 MG/DOSE) 4 MG/3ML ~~LOC~~ SOPN: 1.0000 mg | PEN_INJECTOR | SUBCUTANEOUS | 1 refills | Status: DC

## 2021-08-06 NOTE — Telephone Encounter (Signed)
Requested medication (s) are due for refill today:   No  Requested medication (s) are on the active medication list:   No  Future visit scheduled:   Yes   Last ordered: 06/02/2021 this dose was changed to 1 mg  Returned because got this request for a refill for an older order that was discontinued on 06/02/2021 by Dr. Parks Ranger.   Requested Prescriptions  Pending Prescriptions Disp Refills   OZEMPIC, 0.25 OR 0.5 MG/DOSE, 2 MG/1.5ML SOPN [Pharmacy Med Name: OZEMPIC 0.25-0.5 MG/DOSE PEN]  1    Sig: Inject 0.25 mg into the skin once a week. For first 4 weeks. Then increase dose to 0.5mg  weekly     Endocrinology:  Diabetes - GLP-1 Receptor Agonists Passed - 08/06/2021 10:10 AM      Passed - HBA1C is between 0 and 7.9 and within 180 days    Hgb A1c MFr Bld  Date Value Ref Range Status  05/26/2021 6.9 (H) <5.7 % of total Hgb Final    Comment:    For someone without known diabetes, a hemoglobin A1c value of 6.5% or greater indicates that they may have  diabetes and this should be confirmed with a follow-up  test. . For someone with known diabetes, a value <7% indicates  that their diabetes is well controlled and a value  greater than or equal to 7% indicates suboptimal  control. A1c targets should be individualized based on  duration of diabetes, age, comorbid conditions, and  other considerations. . Currently, no consensus exists regarding use of hemoglobin A1c for diagnosis of diabetes for children. Renella Cunas - Valid encounter within last 6 months    Recent Outpatient Visits           2 weeks ago COVID-19 virus infection   [facility] [place], Devonne Doughty, DO   2 months ago Type 2 diabetes mellitus with other specified complication, with long-term current use of insulin [facility])   [place], DO   5 months ago Type 2 diabetes mellitus with other specified complication, with long-term current use of insulin  [facility])   [place], DO   8 months ago Type 2 diabetes mellitus with other specified complication, with long-term current use of insulin [facility])   [facility] [name], DO   11 months ago Annual physical exam   [facility] [name], DO       Future Appointments             In 8 months [facility], [facility]

## 2021-08-07 ENCOUNTER — Other Ambulatory Visit: Payer: Self-pay | Admitting: Family Medicine

## 2021-08-07 DIAGNOSIS — Z794 Long term (current) use of insulin: Secondary | ICD-10-CM

## 2021-08-07 DIAGNOSIS — E1169 Type 2 diabetes mellitus with other specified complication: Secondary | ICD-10-CM

## 2021-08-19 ENCOUNTER — Other Ambulatory Visit: Payer: Self-pay | Admitting: Orthopedic Surgery

## 2021-08-19 ENCOUNTER — Encounter: Payer: Self-pay | Admitting: Orthopedic Surgery

## 2021-08-19 DIAGNOSIS — M1711 Unilateral primary osteoarthritis, right knee: Secondary | ICD-10-CM

## 2021-08-19 NOTE — H&P (Deleted)
  The note originally documented on this encounter has been moved the the encounter in which it belongs.  

## 2021-08-19 NOTE — H&P (Signed)
Jonathan Shelton MRN:  [id number] DOB/SEX:  January 15, 1951/male  CHIEF COMPLAINT:  Painful right Knee  HISTORY: Patient is a 70 y.o. male presented with a history of pain in the right knee. Onset of symptoms was gradual starting several years ago with gradually worsening course since that time. Prior procedures on the knee include none. Patient has been treated conservatively with over-the-counter NSAIDs and activity modification. Patient currently rates pain in the knee at 10 out of 10 with activity. There is no pain at night.  PAST MEDICAL HISTORY: Patient Active Problem List   Diagnosis Date Noted   History of cerebrovascular accident (CVA) with residual deficit 06/02/2021   Drug-induced myopathy 06/02/2021   Compression fracture of L1 lumbar vertebra ([place]) 11/24/2020   Spondylosis of lumbar region without myelopathy or radiculopathy 11/24/2020   Congenital spinal stenosis of lumbar region 11/24/2020   Acute low back pain 11/19/2020   Hyperlipidemia 10/24/2020   Bilateral primary osteoarthritis of knee 08/20/2020   Hyperlipidemia associated with type 2 diabetes mellitus ([place]) 08/19/2020   Essential hypertension 08/19/2020   Fever 07/10/2020   Cecal polyp 02/15/2020   Hx of adenomatous polyp of colon 02/15/2020   Abnormal colonoscopy 02/15/2020   Tubular adenoma of colon 02/11/2020   Varicose veins of bilateral lower extremities with pain 07/04/2019   Diabetes mellitus ([place]) 05/30/2019   Benign prostatic hyperplasia with incomplete bladder emptying 05/30/2019   Obesity (BMI 30.0-34.9) 05/30/2019   Rash 05/06/2019   Past Medical History:  Diagnosis Date   Allergy    BPH (benign prostatic hyperplasia)    Colon polyps    Diabetes ([place])    Diverticulosis    Gallstones    GERD (gastroesophageal reflux disease)    HOH (hard of hearing)    Kidney stones    Renal disorder    Wears glasses    Past Surgical History:  Procedure Laterality Date   BIOPSY  01/22/2021   Procedure:  BIOPSY;  Surgeon: Irving Copas., MD;  Location: [place];  Service: Gastroenterology;;   CARPAL TUNNEL RELEASE     CHOLECYSTECTOMY     COLONOSCOPY WITH PROPOFOL N/A 02/06/2020   Procedure: COLONOSCOPY WITH PROPOFOL;  Surgeon: Lin Landsman, MD;  Location: [facility];  Service: Gastroenterology;  Laterality: N/A;   COLONOSCOPY WITH PROPOFOL N/A 03/31/2020   Procedure: COLONOSCOPY WITH PROPOFOL;  Surgeon: Rush Landmark Telford Nab., MD;  Location: [place];  Service: Gastroenterology;  Laterality: N/A;   COLONOSCOPY WITH PROPOFOL N/A 01/22/2021   Procedure: COLONOSCOPY WITH PROPOFOL;  Surgeon: Rush Landmark Telford Nab., MD;  Location: [place];  Service: Gastroenterology;  Laterality: N/A;   ENDOSCOPIC MUCOSAL RESECTION N/A 03/31/2020   Procedure: ENDOSCOPIC MUCOSAL RESECTION;  Surgeon: Rush Landmark Telford Nab., MD;  Location: [place];  Service: Gastroenterology;  Laterality: N/A;   HEMOSTASIS CLIP PLACEMENT  03/31/2020   Procedure: HEMOSTASIS CLIP PLACEMENT;  Surgeon: Irving Copas., MD;  Location: [place];  Service: Gastroenterology;;   HERNIA REPAIR     IR KYPHO LUMBAR INC FX REDUCE BONE BX UNI/BIL CANNULATION INC/IMAGING  02/05/2021   IR RADIOLOGIST EVAL & MGMT  11/28/2020   IR RADIOLOGIST EVAL & MGMT  02/26/2021   POLYPECTOMY  03/31/2020   Procedure: POLYPECTOMY;  Surgeon: Irving Copas., MD;  Location: [place];  Service: Gastroenterology;;   POLYPECTOMY  01/22/2021   Procedure: POLYPECTOMY;  Surgeon: Irving Copas., MD;  Location: [place];  Service: Gastroenterology;;   SMALL INTESTINE SURGERY     SUBMUCOSAL LIFTING INJECTION  03/31/2020  Procedure: SUBMUCOSAL LIFTING INJECTION;  Surgeon: Rush Landmark Telford Nab., MD;  Location: [place];  Service: Gastroenterology;;     MEDICATIONS:  (Not in a hospital admission)   ALLERGIES:   Allergies  Allergen Reactions   Testosterone Rash    patch    REVIEW OF SYSTEMS:   Pertinent items are noted in HPI.   FAMILY HISTORY:   Family History  Problem Relation Age of Onset   Heart disease Mother 28   Heart disease Father    Stroke Father 78   Diabetes Father    Heart attack Father    Liver disease Neg Hx    Colon cancer Neg Hx    Esophageal cancer Neg Hx    Pancreatic cancer Neg Hx    Stomach cancer Neg Hx    Inflammatory bowel disease Neg Hx    Rectal cancer Neg Hx     SOCIAL HISTORY:   Social History   Tobacco Use   Smoking status: Former    Types: Cigarettes    Quit date: 1970    Years since quitting: 52.7   Smokeless tobacco: Former    Types: Snuff  Substance Use Topics   Alcohol use: Not Currently     EXAMINATION:  Vital signs in last 24 hours: @VSRANGES @  General appearance: alert, cooperative, and no distress Lungs: clear to auscultation bilaterally Heart: regular rate and rhythm, S1, S2 normal, no murmur, click, rub or gallop Extremities: extremities normal, atraumatic, no cyanosis or edema and Homans sign is negative, no sign of DVT Pulses: 2+ and symmetric Skin: Skin color, texture, turgor normal. No rashes or lesions Neurologic: Alert and oriented X 3, normal strength and tone. Normal symmetric reflexes. Normal coordination and gait  Musculoskeletal:  ROM 0-100, Ligaments intact,  Imaging Review Plain radiographs demonstrate severe degenerative joint disease of the right knee. The overall alignment is significant varus. The bone quality appears to be good for age and reported activity level.  Assessment/Plan: Primary osteoarthritis, right knee   The patient history, physical examination and imaging studies are consistent with advanced degenerative joint disease of the right knee. The patient has failed conservative treatment.  The clearance notes were reviewed.  After discussion with the patient it was felt that Total Knee Replacement was indicated. The procedure,  risks, and benefits of total knee arthroplasty were  presented and reviewed. The risks including but not limited to aseptic loosening, infection, blood clots, vascular injury, stiffness, patella tracking problems complications among others were discussed. The patient acknowledged the explanation, agreed to proceed with the plan.  Carlynn Spry 08/19/2021, 10:39 AM

## 2021-08-24 ENCOUNTER — Other Ambulatory Visit
Admission: RE | Admit: 2021-08-24 | Discharge: 2021-08-24 | Disposition: A | Discharge status: Home / Self Care | Payer: Medicare HMO | Source: Ambulatory Visit | Attending: Orthopedic Surgery | Admitting: Orthopedic Surgery

## 2021-08-24 ENCOUNTER — Other Ambulatory Visit: Payer: Self-pay

## 2021-08-24 HISTORY — DX: Unspecified osteoarthritis, unspecified site: M19.90

## 2021-08-24 NOTE — Patient Instructions (Addendum)
Your procedure is scheduled on: Monday August 31, 2021. Report to Day Surgery inside [facility] 2nd floor. To find out your arrival time please call [phone] between 1PM - 3PM on Friday August 28, 2021.  Remember: Instructions that are not followed completely may result in serious medical risk,  up to and including death, or upon the discretion of your surgeon and anesthesiologist your  surgery may need to be rescheduled.     _X__ 1. Do not eat food or drink fluids after midnight the night before your procedure.                 No chewing gum or hard candies.   __X__2.  On the morning of surgery brush your teeth with toothpaste and water, you                may rinse your mouth with mouthwash if you wish.  Do not swallow any toothpaste or mouthwash.     _X__ 3.  No Alcohol for 24 hours before or after surgery.   _X__ 4.  Do Not Smoke or use e-cigarettes For 24 Hours Prior to Your Surgery.                 Do not use any chewable tobacco products for at least 6 hours prior to                 Surgery.  _X__  5.  Do not use any recreational drugs (marijuana, cocaine, heroin, ecstasy, MDMA or other)                For at least one week prior to your surgery.  Combination of these drugs with anesthesia                May have life threatening results.  __X__6.  Notify your doctor if there is any change in your medical condition      (cold, fever, infections).     Do not wear jewelry, make-up, hairpins, clips or nail polish. Do not wear lotions, powders, or perfumes. You may wear deodorant. Do not shave 48 hours prior to surgery. Men may shave face and neck. Do not bring valuables to the hospital.    [facility] is not responsible for any belongings or valuables.  Contacts, dentures or bridgework may not be worn into surgery. Leave your suitcase in the car. After surgery it may be brought to your room. For patients admitted to the hospital, discharge time  is determined by your treatment team.   Patients discharged the day of surgery will not be allowed to drive home.   Make arrangements for someone to be with you for the first 24 hours of your Same Day Discharge.    Please read over the following fact sheets that you were given:   Total Joint Packet    __X__ Take these medicines the morning of surgery with A SIP OF WATER:    1. None   2.   3.   4.  5.  6.  ____ Fleet Enema (as directed)   __X__ Use CHG Soap (or wipes) as directed  ____ Use Benzoyl Peroxide Gel as instructed  ____ Use inhalers on the day of surgery  __X__ Stop metFORMIN (GLUCOPHAGE) 1000 MG 2 days prior to surgery (Take last dose Friday 08/28/21)   __X__ Stop JARDIANCE 25 MG 3 days prior to surgery (take las dose Thursday 08/27/21)   __X__ Take 1/2 of usual  insulin dose the night before surgery. No insulin the morning          of surgery. insulin NPH Human (NOVOLIN N) 100 UNIT/ML injection  __X__ Stop aspirin 81 mg 7 days before your surgery.  __X__ One Week prior to surgery- Stop Anti-inflammatories such as Ibuprofen, Aleve, Advil, Motrin, meloxicam (MOBIC), diclofenac, etodolac, ketorolac, Toradol, Daypro, piroxicam, Goody's or BC powders. OK TO USE TYLENOL IF NEEDED   __X__ Stop supplements until after surgery. Ascorbic Acid (VITAMIN C), zinc gluconate 50 MG and Omega-3 Fatty Acids (FISH OIL)  ____ Bring C-Pap to the hospital.    If you have any questions regarding your pre-procedure instructions,  Please call Pre-admit Testing at [phone].

## 2021-08-25 ENCOUNTER — Other Ambulatory Visit
Admission: RE | Admit: 2021-08-25 | Discharge: 2021-08-25 | Disposition: A | Discharge status: Home / Self Care | Payer: Medicare HMO | Source: Ambulatory Visit | Attending: Orthopedic Surgery | Admitting: Orthopedic Surgery

## 2021-08-25 DIAGNOSIS — Z01818 Encounter for other preprocedural examination: Secondary | ICD-10-CM | POA: Insufficient documentation

## 2021-08-25 DIAGNOSIS — I10 Essential (primary) hypertension: Secondary | ICD-10-CM | POA: Diagnosis not present

## 2021-08-25 DIAGNOSIS — M1711 Unilateral primary osteoarthritis, right knee: Secondary | ICD-10-CM | POA: Insufficient documentation

## 2021-08-25 LAB — CBC
HCT: 43.3 % (ref 39.0–52.0)
Hemoglobin: 15.4 g/dL (ref 13.0–17.0)
MCH: 31.8 pg (ref 26.0–34.0)
MCHC: 35.6 g/dL (ref 30.0–36.0)
MCV: 89.5 fL (ref 80.0–100.0)
Platelets: 214 10*3/uL (ref 150–400)
RBC: 4.84 MIL/uL (ref 4.22–5.81)
RDW: 13.2 % (ref 11.5–15.5)
WBC: 7.3 10*3/uL (ref 4.0–10.5)
nRBC: 0 % (ref 0.0–0.2)

## 2021-08-25 LAB — BASIC METABOLIC PANEL
Anion gap: 9 (ref 5–15)
BUN: 16 mg/dL (ref 8–23)
CO2: 25 mmol/L (ref 22–32)
Calcium: 8.6 mg/dL — ABNORMAL LOW (ref 8.9–10.3)
Chloride: 104 mmol/L (ref 98–111)
Creatinine, Ser: 0.83 mg/dL (ref 0.61–1.24)
GFR, Estimated: >60 mL/min (ref >60)
Glucose, Bld: 171 mg/dL — ABNORMAL HIGH (ref 70–99)
Potassium: 4.2 mmol/L (ref 3.5–5.1)
Sodium: 138 mmol/L (ref 135–145)

## 2021-08-25 LAB — URINALYSIS, ROUTINE W REFLEX MICROSCOPIC
Bacteria, UA: NONE SEEN
Bilirubin Urine: NEGATIVE
Glucose, UA: >=500 mg/dL — AB
Hgb urine dipstick: NEGATIVE
Ketones, ur: NEGATIVE mg/dL
Leukocytes,Ua: NEGATIVE
Nitrite: NEGATIVE
Protein, ur: NEGATIVE mg/dL
Specific Gravity, Urine: 1.029 (ref 1.005–1.030)
Squamous Epithelial / HPF: NONE SEEN (ref 0–5)
pH: 6 (ref 5.0–8.0)

## 2021-08-25 LAB — PROTIME-INR
INR: 1 (ref 0.8–1.2)
Prothrombin Time: 13.2 seconds (ref 11.4–15.2)

## 2021-08-25 LAB — APTT: aPTT: 34 seconds (ref 24–36)

## 2021-08-25 LAB — TYPE AND SCREEN
ABO/RH(D): O POS
Antibody Screen: NEGATIVE

## 2021-08-25 LAB — SURGICAL PCR SCREEN
MRSA, PCR: NEGATIVE
Staphylococcus aureus: NEGATIVE

## 2021-08-27 ENCOUNTER — Other Ambulatory Visit
Admission: RE | Admit: 2021-08-27 | Discharge: 2021-08-27 | Disposition: A | Discharge status: Home / Self Care | Payer: Medicare HMO | Source: Ambulatory Visit | Attending: Orthopedic Surgery | Admitting: Orthopedic Surgery

## 2021-08-27 ENCOUNTER — Other Ambulatory Visit: Payer: Medicare HMO

## 2021-08-27 ENCOUNTER — Other Ambulatory Visit: Payer: Self-pay

## 2021-08-27 DIAGNOSIS — Z20822 Contact with and (suspected) exposure to covid-19: Secondary | ICD-10-CM | POA: Insufficient documentation

## 2021-08-27 DIAGNOSIS — Z01812 Encounter for preprocedural laboratory examination: Secondary | ICD-10-CM | POA: Diagnosis present

## 2021-08-27 LAB — SARS CORONAVIRUS 2 (TAT 6-24 HRS): SARS Coronavirus 2: NEGATIVE

## 2021-08-30 ENCOUNTER — Other Ambulatory Visit: Payer: Self-pay | Admitting: Family Medicine

## 2021-08-30 DIAGNOSIS — E1169 Type 2 diabetes mellitus with other specified complication: Secondary | ICD-10-CM

## 2021-08-30 DIAGNOSIS — Z794 Long term (current) use of insulin: Secondary | ICD-10-CM

## 2021-08-30 MED ORDER — FAMOTIDINE 20 MG PO TABS: 20.0000 mg | ORAL_TABLET | Freq: Once | ORAL | Status: AC

## 2021-08-30 MED ORDER — CHLORHEXIDINE GLUCONATE 0.12 % MT SOLN: 15.0000 mL | Freq: Once | OROMUCOSAL | Status: AC

## 2021-08-30 MED ORDER — CEFAZOLIN SODIUM-DEXTROSE 2-4 GM/100ML-% IV SOLN
2.0000 g | INTRAVENOUS | Status: AC
  Administered 2021-08-31: 2 g via INTRAVENOUS

## 2021-08-30 MED ORDER — POVIDONE-IODINE 10 % EX SWAB: 2.0000 "application " | Freq: Once | CUTANEOUS | Status: DC

## 2021-08-30 MED ORDER — LACTATED RINGERS IV SOLN: INTRAVENOUS | Status: DC

## 2021-08-30 MED ORDER — ORAL CARE MOUTH RINSE: 15.0000 mL | Freq: Once | OROMUCOSAL | Status: AC

## 2021-08-30 MED ORDER — SODIUM CHLORIDE 0.9 % IV SOLN: INTRAVENOUS | Status: DC

## 2021-08-30 NOTE — Telephone Encounter (Signed)
Requested medication (s) are due for refill today: yes  Requested medication (s) are on the active medication list: yes  Last refill:  02/25/21   #90 1 RF  Future visit scheduled: yes  Notes to clinic:  Overdue LDL level    Requested Prescriptions  Pending Prescriptions Disp Refills   JARDIANCE 25 MG TABS tablet [Pharmacy Med Name: JARDIANCE 25 MG TABLET] 90 tablet 1    Sig: TAKE 1 TABLET BY [place]     Endocrinology:  Diabetes - SGLT2 Inhibitors Failed - 08/30/2021 10:52 AM      Failed - LDL in normal range and within 360 days    LDL Cholesterol (Calc)  Date Value Ref Range Status  08/12/2020 93 mg/dL (calc) Final    Comment:    Reference range: <100 . Desirable range <100 mg/dL for primary prevention;   <70 mg/dL for patients with CHD or diabetic patients  with > or = 2 CHD risk factors. Marland Kitchen LDL-C is now calculated using the Martin-Hopkins  calculation, which is a validated novel method providing  better accuracy than the Friedewald equation in the  estimation of LDL-C.  Cresenciano Genre et al. Annamaria Helling. 8413;244(01): 2061-2068  (http://education.[employer].com/faq/FAQ164)           Passed - Cr in normal range and within 360 days    Creat  Date Value Ref Range Status  05/26/2021 0.79 0.70 - 1.35 mg/dL Final   Creatinine, Ser  Date Value Ref Range Status  08/25/2021 0.83 0.61 - 1.24 mg/dL Final          Passed - HBA1C is between 0 and 7.9 and within 180 days    Hgb A1c MFr Bld  Date Value Ref Range Status  05/26/2021 6.9 (H) <5.7 % of total Hgb Final    Comment:    For someone without known diabetes, a hemoglobin A1c value of 6.5% or greater indicates that they may have  diabetes and this should be confirmed with a follow-up  test. . For someone with known diabetes, a value <7% indicates  that their diabetes is well controlled and a value  greater than or equal to 7% indicates suboptimal  control. A1c targets should be individualized based on  duration  of diabetes, age, comorbid conditions, and  other considerations. . Currently, no consensus exists regarding use of hemoglobin A1c for diagnosis of diabetes for children. .           Passed - eGFR in normal range and within 360 days    GFR, Est African American  Date Value Ref Range Status  08/12/2020 112 > OR = 60 mL/min/1.85m Final   GFR, Est Non African American  Date Value Ref Range Status  08/12/2020 96 > OR = 60 mL/min/1.751mFinal   GFR, Estimated  Date Value Ref Range Status  08/25/2021 >60 >60 mL/min Final    Comment:    (NOTE) Calculated using the CKD-EPI Creatinine Equation (2021)    eGFR  Date Value Ref Range Status  05/26/2021 96 > OR = 60 mL/min/1.7379minal    Comment:    The eGFR is based on the CKD-EPI 2021 equation. To calculate  the new eGFR from a previous Creatinine or Cystatin C result, go to https://www.kidney.org/professionals/ kdoqi/gfr%5Fcalculator           Passed - Valid encounter within last 6 months    Recent Outpatient Visits           1 month ago COVID-19 virus infection  [facility] [place], Devonne Doughty, DO   2 months ago Type 2 diabetes mellitus with other specified complication, with long-term current use of insulin [facility])   [facility] [place], Devonne Doughty, DO   6 months ago Type 2 diabetes mellitus with other specified complication, with long-term current use of insulin [facility])   [facility], Devonne Doughty, DO   9 months ago Type 2 diabetes mellitus with other specified complication, with long-term current use of insulin ([place])   [facility] [name], DO   1 year ago Annual physical exam   [facility] [name], DO       Future Appointments             In 8 months [facility], [facility]

## 2021-08-31 ENCOUNTER — Ambulatory Visit: Payer: Medicare HMO | Admitting: Anesthesiology

## 2021-08-31 ENCOUNTER — Encounter
Admission: RE | Disposition: A | Discharge status: Home / Self Care | Payer: Self-pay | Source: Ambulatory Visit | Attending: Orthopedic Surgery

## 2021-08-31 ENCOUNTER — Ambulatory Visit: Payer: Medicare HMO

## 2021-08-31 ENCOUNTER — Observation Stay
Admission: RE | Admit: 2021-08-31 | Discharge: 2021-09-02 | Disposition: A | Discharge status: Home / Self Care | Payer: Medicare HMO | Source: Ambulatory Visit | Attending: Orthopedic Surgery | Admitting: Orthopedic Surgery

## 2021-08-31 ENCOUNTER — Encounter: Payer: Self-pay | Admitting: Orthopedic Surgery

## 2021-08-31 ENCOUNTER — Other Ambulatory Visit: Payer: Self-pay

## 2021-08-31 DIAGNOSIS — E119 Type 2 diabetes mellitus without complications: Secondary | ICD-10-CM | POA: Insufficient documentation

## 2021-08-31 DIAGNOSIS — Z419 Encounter for procedure for purposes other than remedying health state, unspecified: Secondary | ICD-10-CM

## 2021-08-31 DIAGNOSIS — I10 Essential (primary) hypertension: Secondary | ICD-10-CM | POA: Insufficient documentation

## 2021-08-31 DIAGNOSIS — Z87891 Personal history of nicotine dependence: Secondary | ICD-10-CM | POA: Diagnosis not present

## 2021-08-31 DIAGNOSIS — Z96651 Presence of right artificial knee joint: Secondary | ICD-10-CM

## 2021-08-31 DIAGNOSIS — M1711 Unilateral primary osteoarthritis, right knee: Secondary | ICD-10-CM | POA: Diagnosis not present

## 2021-08-31 DIAGNOSIS — Z23 Encounter for immunization: Secondary | ICD-10-CM | POA: Insufficient documentation

## 2021-08-31 HISTORY — PX: TOTAL KNEE ARTHROPLASTY: SHX125

## 2021-08-31 LAB — GLUCOSE, CAPILLARY
Glucose-Capillary: 135 mg/dL — ABNORMAL HIGH (ref 70–99)
Glucose-Capillary: 105 mg/dL — ABNORMAL HIGH (ref 70–99)
Glucose-Capillary: 95 mg/dL (ref 70–99)
Glucose-Capillary: 219 mg/dL — ABNORMAL HIGH (ref 70–99)

## 2021-08-31 SURGERY — ARTHROPLASTY, KNEE, TOTAL
Anesthesia: Regional | Site: Knee | Laterality: Right

## 2021-08-31 MED ORDER — FINASTERIDE 5 MG PO TABS
5.0000 mg | ORAL_TABLET | Freq: Every day | ORAL | Status: DC
  Administered 2021-09-01 – 2021-09-02 (×2): 5 mg via ORAL
  Filled 2021-08-31 (×2): qty 1

## 2021-08-31 MED ORDER — BUPIVACAINE HCL (PF) 0.5 % IJ SOLN
INTRAMUSCULAR | Status: DC | PRN
  Administered 2021-08-31: 3 mL

## 2021-08-31 MED ORDER — LACTATED RINGERS IV SOLN: INTRAVENOUS | Status: DC

## 2021-08-31 MED ORDER — SODIUM CHLORIDE (PF) 0.9 % IJ SOLN
INTRAMUSCULAR | Status: DC | PRN
  Administered 2021-08-31: 90 mL

## 2021-08-31 MED ORDER — METFORMIN HCL 500 MG PO TABS
1000.0000 mg | ORAL_TABLET | Freq: Two times a day (BID) | ORAL | Status: DC
  Administered 2021-08-31 – 2021-09-02 (×4): 1000 mg via ORAL
  Filled 2021-08-31 (×4): qty 2

## 2021-08-31 MED ORDER — ACETAMINOPHEN 325 MG PO TABS: 325.0000 mg | ORAL_TABLET | Freq: Four times a day (QID) | ORAL | Status: DC | PRN

## 2021-08-31 MED ORDER — EMPAGLIFLOZIN 25 MG PO TABS
25.0000 mg | ORAL_TABLET | Freq: Every day | ORAL | Status: DC
  Administered 2021-09-01 – 2021-09-02 (×2): 25 mg via ORAL
  Filled 2021-08-31 (×2): qty 1

## 2021-08-31 MED ORDER — DIPHENHYDRAMINE HCL 12.5 MG/5ML PO ELIX: 12.5000 mg | ORAL_SOLUTION | ORAL | Status: DC | PRN

## 2021-08-31 MED ORDER — DOCUSATE SODIUM 100 MG PO CAPS
100.0000 mg | ORAL_CAPSULE | Freq: Two times a day (BID) | ORAL | Status: DC
  Administered 2021-08-31 – 2021-09-02 (×3): 100 mg via ORAL
  Filled 2021-08-31 (×4): qty 1

## 2021-08-31 MED ORDER — KETOROLAC TROMETHAMINE 15 MG/ML IJ SOLN
7.5000 mg | Freq: Four times a day (QID) | INTRAMUSCULAR | Status: AC
  Administered 2021-08-31 – 2021-09-01 (×4): 7.5 mg via INTRAVENOUS
  Filled 2021-08-31 (×4): qty 1

## 2021-08-31 MED ORDER — INSULIN ASPART 100 UNIT/ML IJ SOLN: 0.0000 [IU] | Freq: Every day | INTRAMUSCULAR | Status: DC

## 2021-08-31 MED ORDER — INSULIN DETEMIR 100 UNIT/ML ~~LOC~~ SOLN
35.0000 [IU] | Freq: Every day | SUBCUTANEOUS | Status: DC
  Administered 2021-09-02: 35 [IU] via SUBCUTANEOUS
  Filled 2021-08-31 (×3): qty 0.35

## 2021-08-31 MED ORDER — INSULIN ASPART 100 UNIT/ML IJ SOLN
0.0000 [IU] | Freq: Three times a day (TID) | INTRAMUSCULAR | Status: DC
  Administered 2021-09-01: 3 [IU] via SUBCUTANEOUS
  Administered 2021-09-01: 5 [IU] via SUBCUTANEOUS
  Administered 2021-09-01: 8 [IU] via SUBCUTANEOUS
  Administered 2021-09-02 (×2): 3 [IU] via SUBCUTANEOUS
  Filled 2021-08-31 (×5): qty 1

## 2021-08-31 MED ORDER — ONDANSETRON HCL 4 MG/2ML IJ SOLN
INTRAMUSCULAR | Status: AC
  Filled 2021-08-31: qty 2

## 2021-08-31 MED ORDER — ONDANSETRON HCL 4 MG/2ML IJ SOLN
INTRAMUSCULAR | Status: DC | PRN
  Administered 2021-08-31: 4 mg via INTRAVENOUS

## 2021-08-31 MED ORDER — TAMSULOSIN HCL 0.4 MG PO CAPS
0.8000 mg | ORAL_CAPSULE | Freq: Every day | ORAL | Status: DC
  Administered 2021-09-01 – 2021-09-02 (×2): 0.8 mg via ORAL
  Filled 2021-08-31 (×2): qty 2

## 2021-08-31 MED ORDER — BUPIVACAINE LIPOSOME 1.3 % IJ SUSP
INTRAMUSCULAR | Status: AC
  Filled 2021-08-31: qty 20

## 2021-08-31 MED ORDER — METOCLOPRAMIDE HCL 5 MG/ML IJ SOLN: 5.0000 mg | Freq: Three times a day (TID) | INTRAMUSCULAR | Status: DC | PRN

## 2021-08-31 MED ORDER — OXYCODONE HCL 5 MG PO TABS
5.0000 mg | ORAL_TABLET | Freq: Once | ORAL | Status: DC | PRN
Start: 2021-08-31 — End: 2021-08-31

## 2021-08-31 MED ORDER — FENTANYL CITRATE PF 50 MCG/ML IJ SOSY
PREFILLED_SYRINGE | INTRAMUSCULAR | Status: AC
  Administered 2021-08-31: 50 ug via INTRAVENOUS
  Filled 2021-08-31: qty 2

## 2021-08-31 MED ORDER — 0.9 % SODIUM CHLORIDE (POUR BTL) OPTIME
TOPICAL | Status: DC | PRN
  Administered 2021-08-31: 1000 mL

## 2021-08-31 MED ORDER — CEFAZOLIN SODIUM-DEXTROSE 2-4 GM/100ML-% IV SOLN
2.0000 g | Freq: Four times a day (QID) | INTRAVENOUS | Status: AC
Start: 2021-08-31 — End: 2021-09-01
  Administered 2021-08-31 – 2021-09-01 (×2): 2 g via INTRAVENOUS
  Filled 2021-08-31 (×2): qty 100

## 2021-08-31 MED ORDER — BUPIVACAINE HCL (PF) 0.5 % IJ SOLN
INTRAMUSCULAR | Status: AC
  Filled 2021-08-31: qty 20

## 2021-08-31 MED ORDER — FENTANYL CITRATE PF 50 MCG/ML IJ SOSY: 100.0000 ug | PREFILLED_SYRINGE | Freq: Once | INTRAMUSCULAR | Status: AC

## 2021-08-31 MED ORDER — PROPOFOL 1000 MG/100ML IV EMUL
INTRAVENOUS | Status: AC
  Filled 2021-08-31: qty 100

## 2021-08-31 MED ORDER — ACETAMINOPHEN 10 MG/ML IV SOLN
INTRAVENOUS | Status: DC | PRN
  Administered 2021-08-31: 1000 mg via INTRAVENOUS

## 2021-08-31 MED ORDER — ALUM & MAG HYDROXIDE-SIMETH 200-200-20 MG/5ML PO SUSP: 30.0000 mL | ORAL | Status: DC | PRN

## 2021-08-31 MED ORDER — LEVALBUTEROL TARTRATE 45 MCG/ACT IN AERO
1.0000 | INHALATION_SPRAY | Freq: Four times a day (QID) | RESPIRATORY_TRACT | Status: DC | PRN
  Filled 2021-08-31: qty 15

## 2021-08-31 MED ORDER — OXYCODONE HCL 5 MG/5ML PO SOLN
5.0000 mg | Freq: Once | ORAL | Status: DC | PRN
Start: 2021-08-31 — End: 2021-08-31

## 2021-08-31 MED ORDER — PHENYLEPHRINE HCL (PRESSORS) 10 MG/ML IV SOLN
INTRAVENOUS | Status: DC | PRN
  Administered 2021-08-31 (×2): 80 ug via INTRAVENOUS
  Administered 2021-08-31: 120 ug via INTRAVENOUS

## 2021-08-31 MED ORDER — BUPIVACAINE HCL (PF) 0.5 % IJ SOLN
INTRAMUSCULAR | Status: DC | PRN
  Administered 2021-08-31: 20 mL via PERINEURAL

## 2021-08-31 MED ORDER — ONDANSETRON HCL 4 MG PO TABS: 4.0000 mg | ORAL_TABLET | Freq: Four times a day (QID) | ORAL | Status: DC | PRN

## 2021-08-31 MED ORDER — ASPIRIN 81 MG PO CHEW
81.0000 mg | CHEWABLE_TABLET | Freq: Two times a day (BID) | ORAL | Status: DC
  Administered 2021-08-31 – 2021-09-02 (×4): 81 mg via ORAL
  Filled 2021-08-31 (×5): qty 1

## 2021-08-31 MED ORDER — ACETAMINOPHEN 10 MG/ML IV SOLN: 1000.0000 mg | Freq: Once | INTRAVENOUS | Status: DC | PRN

## 2021-08-31 MED ORDER — INFLUENZA VAC A&B SA ADJ QUAD 0.5 ML IM PRSY
0.5000 mL | PREFILLED_SYRINGE | INTRAMUSCULAR | Status: AC
  Administered 2021-09-01: 0.5 mL via INTRAMUSCULAR
  Filled 2021-08-31: qty 0.5

## 2021-08-31 MED ORDER — PROPOFOL 10 MG/ML IV BOLUS
INTRAVENOUS | Status: AC
  Filled 2021-08-31: qty 40

## 2021-08-31 MED ORDER — TRANEXAMIC ACID-NACL 1000-0.7 MG/100ML-% IV SOLN
1000.0000 mg | INTRAVENOUS | Status: AC
  Administered 2021-08-31: 1000 mg via INTRAVENOUS

## 2021-08-31 MED ORDER — PROPOFOL 500 MG/50ML IV EMUL
INTRAVENOUS | Status: DC | PRN
  Administered 2021-08-31: 100 ug/kg/min via INTRAVENOUS

## 2021-08-31 MED ORDER — MENTHOL 3 MG MT LOZG
1.0000 | LOZENGE | OROMUCOSAL | Status: DC | PRN
  Filled 2021-08-31: qty 9

## 2021-08-31 MED ORDER — ONDANSETRON HCL 4 MG/2ML IJ SOLN: 4.0000 mg | Freq: Four times a day (QID) | INTRAMUSCULAR | Status: DC | PRN

## 2021-08-31 MED ORDER — CEFAZOLIN SODIUM-DEXTROSE 2-4 GM/100ML-% IV SOLN
INTRAVENOUS | Status: AC
  Filled 2021-08-31: qty 100

## 2021-08-31 MED ORDER — LIDOCAINE HCL (PF) 2 % IJ SOLN
INTRAMUSCULAR | Status: AC
  Filled 2021-08-31: qty 5

## 2021-08-31 MED ORDER — METOCLOPRAMIDE HCL 10 MG PO TABS
5.0000 mg | ORAL_TABLET | Freq: Three times a day (TID) | ORAL | Status: DC | PRN
Start: 2021-08-31 — End: 2021-09-02

## 2021-08-31 MED ORDER — FENTANYL CITRATE (PF) 100 MCG/2ML IJ SOLN: 25.0000 ug | INTRAMUSCULAR | Status: DC | PRN

## 2021-08-31 MED ORDER — CHLORHEXIDINE GLUCONATE 0.12 % MT SOLN
OROMUCOSAL | Status: AC
  Administered 2021-08-31: 15 mL via OROMUCOSAL
  Filled 2021-08-31: qty 15

## 2021-08-31 MED ORDER — HYDROCODONE-ACETAMINOPHEN 5-325 MG PO TABS
1.0000 | ORAL_TABLET | ORAL | Status: DC | PRN
  Administered 2021-09-01 – 2021-09-02 (×3): 1 via ORAL
  Filled 2021-08-31: qty 1
  Filled 2021-08-31: qty 2
  Filled 2021-08-31 (×2): qty 1
  Filled 2021-08-31: qty 2

## 2021-08-31 MED ORDER — SODIUM CHLORIDE 0.9 % IR SOLN
Status: DC | PRN
  Administered 2021-08-31: 3000 mL

## 2021-08-31 MED ORDER — SEMAGLUTIDE (1 MG/DOSE) 4 MG/3ML ~~LOC~~ SOPN: 1.0000 mg | PEN_INJECTOR | SUBCUTANEOUS | Status: DC

## 2021-08-31 MED ORDER — HYDROCODONE-ACETAMINOPHEN 7.5-325 MG PO TABS: 1.0000 | ORAL_TABLET | ORAL | Status: DC | PRN

## 2021-08-31 MED ORDER — FAMOTIDINE 20 MG PO TABS
ORAL_TABLET | ORAL | Status: AC
  Administered 2021-08-31: 20 mg via ORAL
  Filled 2021-08-31: qty 1

## 2021-08-31 MED ORDER — PROPOFOL 10 MG/ML IV BOLUS
INTRAVENOUS | Status: AC
  Filled 2021-08-31: qty 20

## 2021-08-31 MED ORDER — FAMOTIDINE 20 MG PO TABS
20.0000 mg | ORAL_TABLET | Freq: Once | ORAL | Status: DC
Start: 2021-08-31 — End: 2021-08-31

## 2021-08-31 MED ORDER — VITAMIN B-12 1000 MCG PO TABS
1000.0000 ug | ORAL_TABLET | ORAL | Status: DC
  Administered 2021-09-02: 1000 ug via ORAL
  Filled 2021-08-31: qty 1

## 2021-08-31 MED ORDER — PROPOFOL 10 MG/ML IV BOLUS
INTRAVENOUS | Status: DC | PRN
  Administered 2021-08-31 (×2): 20 mg via INTRAVENOUS

## 2021-08-31 MED ORDER — IRBESARTAN 75 MG PO TABS
75.0000 mg | ORAL_TABLET | Freq: Every day | ORAL | Status: DC
  Administered 2021-09-01 – 2021-09-02 (×2): 75 mg via ORAL
  Filled 2021-08-31 (×3): qty 1

## 2021-08-31 MED ORDER — GABAPENTIN 100 MG PO CAPS
200.0000 mg | ORAL_CAPSULE | Freq: Every day | ORAL | Status: DC
  Administered 2021-09-01 (×2): 200 mg via ORAL
  Filled 2021-08-31 (×2): qty 2

## 2021-08-31 MED ORDER — TRANEXAMIC ACID-NACL 1000-0.7 MG/100ML-% IV SOLN
INTRAVENOUS | Status: AC
  Filled 2021-08-31: qty 100

## 2021-08-31 MED ORDER — PHENOL 1.4 % MT LIQD
1.0000 | OROMUCOSAL | Status: DC | PRN
  Filled 2021-08-31: qty 177

## 2021-08-31 MED ORDER — CARBIDOPA-LEVODOPA 25-100 MG PO TABS
1.0000 | ORAL_TABLET | Freq: Every day | ORAL | Status: DC
  Administered 2021-08-31 – 2021-09-01 (×2): 1 via ORAL
  Filled 2021-08-31 (×2): qty 1

## 2021-08-31 MED ORDER — SODIUM CHLORIDE FLUSH 0.9 % IV SOLN
INTRAVENOUS | Status: AC
  Filled 2021-08-31: qty 40

## 2021-08-31 MED ORDER — PHENYLEPHRINE HCL-NACL 20-0.9 MG/250ML-% IV SOLN
INTRAVENOUS | Status: DC | PRN
  Administered 2021-08-31: 30 ug/min via INTRAVENOUS

## 2021-08-31 MED ORDER — ALIROCUMAB 75 MG/ML ~~LOC~~ SOAJ: 75.0000 mg | SUBCUTANEOUS | Status: DC

## 2021-08-31 MED ORDER — STERILE WATER FOR IRRIGATION IR SOLN
Status: DC | PRN
Start: 2021-08-31 — End: 2021-08-31
  Administered 2021-08-31: 1000 mL

## 2021-08-31 MED ORDER — MORPHINE SULFATE (PF) 2 MG/ML IV SOLN: 0.5000 mg | INTRAVENOUS | Status: DC | PRN

## 2021-08-31 MED ORDER — BISACODYL 10 MG RE SUPP
10.0000 mg | Freq: Every day | RECTAL | Status: DC | PRN
  Administered 2021-08-31: 10 mg via RECTAL
  Filled 2021-08-31: qty 1

## 2021-08-31 MED ORDER — ACETAMINOPHEN 10 MG/ML IV SOLN
INTRAVENOUS | Status: AC
  Filled 2021-08-31: qty 100

## 2021-08-31 MED ORDER — SURGIRINSE WOUND IRRIGATION SYSTEM - OPTIME: TOPICAL | Status: DC | PRN

## 2021-08-31 MED ORDER — BUPIVACAINE-EPINEPHRINE (PF) 0.5% -1:200000 IJ SOLN
INTRAMUSCULAR | Status: AC
  Filled 2021-08-31: qty 30

## 2021-08-31 MED ORDER — MIDAZOLAM HCL 2 MG/2ML IJ SOLN
INTRAMUSCULAR | Status: AC
  Filled 2021-08-31: qty 2

## 2021-08-31 MED ORDER — ONDANSETRON HCL 4 MG/2ML IJ SOLN
4.0000 mg | Freq: Once | INTRAMUSCULAR | Status: DC | PRN
Start: 2021-08-31 — End: 2021-08-31

## 2021-08-31 SURGICAL SUPPLY — 60 items
BASEPLATE TIBIAL RT SZ 5 (Knees) ×1 IMPLANT
BLADE SAGITTAL AGGR TOOTH XLG (BLADE) ×2 IMPLANT
BLADE SAW SAG 25X90X1.19 (BLADE) ×2 IMPLANT
BOWL CEMENT MIX W/ADAPTER (MISCELLANEOUS) ×2 IMPLANT
BRUSH SCRUB EZ  4% CHG (MISCELLANEOUS) ×2
BRUSH SCRUB EZ 4% CHG (MISCELLANEOUS) ×2 IMPLANT
CEMENT BONE 40GM (Cement) ×4 IMPLANT
CHLORAPREP W/TINT 26 (MISCELLANEOUS) ×4 IMPLANT
COMP PATELLA GENESIS 35MM (Stem) ×2 IMPLANT
COMPONENT FEM OXINIUM RT SZ5 (Knees) ×2 IMPLANT
COMPONENT PATELLA GENESIS 35MM (Stem) ×1 IMPLANT
COOLER POLAR GLACIER W/PUMP (MISCELLANEOUS) ×2 IMPLANT
CUFF TOURN SGL QUICK 34 (TOURNIQUET CUFF)
CUFF TRNQT CYL 34X4.125X (TOURNIQUET CUFF) IMPLANT
DRAPE 3/4 80X56 (DRAPES) ×4 IMPLANT
DRAPE INCISE IOBAN 66X60 STRL (DRAPES) ×2 IMPLANT
ELECT REM PT RETURN 9FT ADLT (ELECTROSURGICAL) ×2
ELECTRODE REM PT RTRN 9FT ADLT (ELECTROSURGICAL) ×1 IMPLANT
GAUZE 4X4 16PLY ~~LOC~~+RFID DBL (SPONGE) ×2 IMPLANT
GAUZE SPONGE 4X4 12PLY STRL (GAUZE/BANDAGES/DRESSINGS) ×2 IMPLANT
GAUZE XEROFORM 1X8 LF (GAUZE/BANDAGES/DRESSINGS) ×2 IMPLANT
GLOVE SURG ENC MOIS LTX SZ8 (GLOVE) ×2 IMPLANT
GLOVE SURG ORTHO LTX SZ8 (GLOVE) ×6 IMPLANT
GLOVE SURG UNDER LTX SZ8 (GLOVE) ×2 IMPLANT
GLOVE SURG UNDER POLY LF SZ8.5 (GLOVE) ×2 IMPLANT
GOWN STRL REUS W/ TWL LRG LVL3 (GOWN DISPOSABLE) ×1 IMPLANT
GOWN STRL REUS W/ TWL XL LVL3 (GOWN DISPOSABLE) ×1 IMPLANT
GOWN STRL REUS W/TWL LRG LVL3 (GOWN DISPOSABLE) ×1
GOWN STRL REUS W/TWL XL LVL3 (GOWN DISPOSABLE) ×1
INSERT TIB XLPE 9 SZ 5-6 (Insert) ×2 IMPLANT
IRRIGATION SURGIPHOR STRL (IV SOLUTION) ×2 IMPLANT
IV NS 1000ML (IV SOLUTION) ×1
IV NS 1000ML BAXH (IV SOLUTION) ×1 IMPLANT
KIT TURNOVER KIT A (KITS) ×2 IMPLANT
MANIFOLD NEPTUNE II (INSTRUMENTS) ×2 IMPLANT
MAT ABSORB  FLUID 56X50 GRAY (MISCELLANEOUS) ×1
MAT ABSORB FLUID 56X50 GRAY (MISCELLANEOUS) ×1 IMPLANT
NDL SAFETY ECLIPSE 18X1.5 (NEEDLE) ×1 IMPLANT
NEEDLE HYPO 18GX1.5 SHARP (NEEDLE) ×1
NEEDLE SPNL 20GX3.5 QUINCKE YW (NEEDLE) ×2 IMPLANT
NS IRRIG 1000ML POUR BTL (IV SOLUTION) ×2 IMPLANT
PACK TOTAL KNEE (MISCELLANEOUS) ×2 IMPLANT
PAD DE MAYO PRESSURE PROTECT (MISCELLANEOUS) ×2 IMPLANT
PAD WRAPON POLAR KNEE (MISCELLANEOUS) ×1 IMPLANT
PULSAVAC PLUS IRRIG FAN TIP (DISPOSABLE) ×2
SPONGE T-LAP 18X18 ~~LOC~~+RFID (SPONGE) ×6 IMPLANT
STAPLER SKIN PROX 35W (STAPLE) ×2 IMPLANT
SUCTION FRAZIER HANDLE 10FR (MISCELLANEOUS) ×1
SUCTION TUBE FRAZIER 10FR DISP (MISCELLANEOUS) ×1 IMPLANT
SUT DVC 2 QUILL PDO  T11 36X36 (SUTURE) ×1
SUT DVC 2 QUILL PDO T11 36X36 (SUTURE) ×1 IMPLANT
SUT VIC AB 2-0 CT1 18 (SUTURE) ×2 IMPLANT
SUT VIC AB 2-0 CT1 27 (SUTURE)
SUT VIC AB 2-0 CT1 TAPERPNT 27 (SUTURE) IMPLANT
SUT VIC AB PLUS 45CM 1-MO-4 (SUTURE) ×2 IMPLANT
SYR 30ML LL (SYRINGE) ×6 IMPLANT
TIBIAL BASEPLATE SZ 5 (Knees) ×2 IMPLANT
TIP FAN IRRIG PULSAVAC PLUS (DISPOSABLE) ×1 IMPLANT
WATER STERILE IRR 500ML POUR (IV SOLUTION) ×2 IMPLANT
WRAPON POLAR PAD KNEE (MISCELLANEOUS) ×2

## 2021-08-31 NOTE — Progress Notes (Signed)
Spinal level L1 bladder scan performed @ 1525 with 310 ml urine in bladder, in and out cath with 300 ml urine returned clear yellow urine.

## 2021-08-31 NOTE — Addendum Note (Signed)
Addendum  created 08/31/21 2338 by Jerrye Noble, CRNA   Intraprocedure Meds edited

## 2021-08-31 NOTE — Anesthesia Postprocedure Evaluation (Signed)
Anesthesia Post Note  Patient: Jonathan Shelton  Procedure(s) Performed: TOTAL KNEE ARTHROPLASTY (Right: Knee)  Patient location during evaluation: PACU Anesthesia Type: Regional Level of consciousness: awake and alert, awake and oriented Pain management: pain level controlled Vital Signs Assessment: post-procedure vital signs reviewed and stable Respiratory status: spontaneous breathing, nonlabored ventilation and respiratory function stable Cardiovascular status: blood pressure returned to baseline and stable Postop Assessment: no apparent nausea or vomiting Anesthetic complications: no   No notable events documented.   Last Vitals:  Vitals:   08/31/21 1640 08/31/21 1946  BP: 132/82 (!) 143/70  Pulse: 67 79  Resp: 17 16  Temp: 36.9 C 36.7 C  SpO2: 98% 94%    Last Pain:  Vitals:   08/31/21 1640  TempSrc: Oral  PainSc:                  Phill Mutter

## 2021-08-31 NOTE — TOC Progression Note (Addendum)
Transition of Care [facility]) - Progression Note    Patient Details  Name: Jonathan Shelton MRN: [id number] Date of Birth: 08/05/1951  Transition of Care [facility]) CM/SW [place], RN Phone Number: 08/31/2021, 1:51 PM  Clinical Narrative:   Judson Roch with [facility] called and stated that they received the request to accept the patient for [facility] PT prior to Surgery from the Surgeon's offie, they have accepted the patient for [facility]         Expected Discharge Plan and Services                                                 Social Determinants of Health (SDOH) Interventions    Readmission Risk Interventions No flowsheet data found.

## 2021-08-31 NOTE — Transfer of Care (Signed)
Immediate Anesthesia Transfer of Care Note  Patient: Jonathan Shelton  Procedure(s) Performed: TOTAL KNEE ARTHROPLASTY (Right: Knee)  Patient Location: PACU  Anesthesia Type:Spinal  Level of Consciousness: drowsy and patient cooperative  Airway & Oxygen Therapy: Patient Spontanous Breathing and Patient connected to face mask oxygen  Post-op Assessment: Report given to RN and Post -op Vital signs reviewed and stable  Post vital signs: Reviewed and stable  Last Vitals:  Vitals Value Taken Time  BP 107/71 08/31/21 1503  Temp    Pulse 77 08/31/21 1506  Resp 19 08/31/21 1506  SpO2 99 % 08/31/21 1506  Vitals shown include unvalidated device data.  Last Pain:  Vitals:   08/31/21 1038  TempSrc: Oral  PainSc: 0-No pain         Complications: No notable events documented.

## 2021-08-31 NOTE — Anesthesia Procedure Notes (Addendum)
Spinal  Patient location during procedure: OR Start time: 08/31/2021 1:07 PM End time: 08/31/2021 1:19 PM Reason for block: surgical anesthesia Staffing Performed: resident/CRNA  Anesthesiologist: Darrin Nipper, MD Resident/CRNA: Jerrye Noble, CRNA Preanesthetic Checklist Completed: patient identified, IV checked, site marked, risks and benefits discussed, surgical consent, monitors and equipment checked, pre-op evaluation and timeout performed Spinal Block Patient position: sitting Prep: ChloraPrep Patient monitoring: continuous pulse ox and blood pressure Approach: midline Location: L3-4 Injection technique: single-shot Needle Needle type: Pencan  Needle gauge: 24 G Needle length: 9 cm Assessment Sensory level: T4 Events: CSF return Additional Notes Attempt x 2.  First attempt by Lucita Ferrara, SRNA.  Second attempt by CRNA.  Negative heme, negative paresthesia.

## 2021-08-31 NOTE — Anesthesia Procedure Notes (Signed)
Procedure Name: MAC Date/Time: 08/31/2021 1:05 PM Performed by: Jerrye Noble, CRNA Pre-anesthesia Checklist: Patient identified, Emergency Drugs available, Suction available, Patient being monitored and Timeout performed Patient Re-evaluated:Patient Re-evaluated prior to induction Oxygen Delivery Method: Simple face mask

## 2021-08-31 NOTE — Anesthesia Procedure Notes (Signed)
Anesthesia Regional Block: Adductor canal block   Pre-Anesthetic Checklist: , timeout performed,  Correct Patient, Correct Site, Correct Laterality,  Correct Procedure, Correct Position, site marked,  Risks and benefits discussed,  Surgical consent,  Pre-op evaluation,  At surgeon's request and post-op pain management  Laterality: Right  Prep: chloraprep       Needles:  Injection technique: Single-shot  Needle Type: Echogenic Needle     Needle Length: 9cm  Needle Gauge: 21     Additional Needles:   Procedures:,,,, ultrasound used (permanent image in chart),,    Narrative:  Start time: 08/31/2021 11:13 AM End time: 08/31/2021 11:14 AM Injection made incrementally with aspirations every 5 mL.  Performed by: Personally  Anesthesiologist: Darrin Nipper, MD  Additional Notes: Functioning IV was confirmed and monitors applied. Ultrasound guidance: relevant anatomy identified, needle position confirmed, local anesthetic spread visualized around nerve(s)., vascular puncture avoided.  Image saved for medical record.  Negative aspiration and no paresthesias; incremental administration of local anesthetic. The patient tolerated the procedure well. Vital signs recorded in RN notes.

## 2021-08-31 NOTE — H&P (Signed)
The patient has been re-examined, and the chart reviewed, and there have been no interval changes to the documented history and physical.  Plan a right total knee today.  Anesthesia is consulted regarding a peripheral nerve block for post-operative pain.  The risks, benefits, and alternatives have been discussed at length, and the patient is willing to proceed.     

## 2021-08-31 NOTE — Anesthesia Preprocedure Evaluation (Addendum)
Anesthesia Evaluation  Patient identified by MRN, date of birth, ID band Patient awake    Reviewed: Allergy & Precautions, NPO status , Patient's Chart, lab work & pertinent test results  History of Anesthesia Complications Negative for: history of anesthetic complications  Airway Mallampati: IV   Neck ROM: Full    Dental   Missing few molars:   Pulmonary former smoker (quit 1970),    Pulmonary exam normal breath sounds clear to auscultation       Cardiovascular Normal cardiovascular exam Rhythm:Regular Rate:Normal  ECG 08/25/21: normal   Neuro/Psych HOH  Neuromuscular disease (atypical parkinsonism)    GI/Hepatic GERD  ,  Endo/Other  diabetes, Type 2, Insulin DependentObesity   Renal/GU Renal disease (nephrolithiasis)   BPH    Musculoskeletal  (+) Arthritis ,   Abdominal   Peds  Hematology negative hematology ROS (+)   Anesthesia Other Findings   Reproductive/Obstetrics                            Anesthesia Physical Anesthesia Plan  ASA: 3  Anesthesia Plan: General, Regional and Spinal   Post-op Pain Management:  Regional for Post-op pain and GA combined w/ Regional for post-op pain   Induction: Intravenous  PONV Risk Score and Plan: 2 and Ondansetron, Treatment may vary due to age or medical condition, Propofol infusion and TIVA  Airway Management Planned: Natural Airway  Additional Equipment:   Intra-op Plan:   Post-operative Plan:   Informed Consent: I have reviewed the patients History and Physical, chart, labs and discussed the procedure including the risks, benefits and alternatives for the proposed anesthesia with the patient or authorized representative who has indicated his/her understanding and acceptance.       Plan Discussed with: CRNA  Anesthesia Plan Comments: (Plan for preoperative adductor saphenous nerve block, spinal, and GA with natural airway,  GETA backup.)       Anesthesia Quick Evaluation

## 2021-08-31 NOTE — Progress Notes (Signed)
PT Cancellation Note  Patient Details Name: Jonathan Shelton MRN: [id number] DOB: 1950/12/03   Cancelled Treatment:    Reason Eval/Treat Not Completed: Other (comment).  PT consult received.  Chart reviewed.  Pt still with impaired LE strength and sensation s/p spinal anesthesia and not ready yet for PT evaluation (discussed with pt and pt's nurse who were in agreement).  Will re-attempt PT evaluation tomorrow morning.  Leitha Bleak, PT 08/31/21, 5:31 PM

## 2021-08-31 NOTE — Op Note (Signed)
DATE OF SURGERY:  08/31/2021 TIME: 2:57 PM  PATIENT NAME:  Jonathan Shelton   AGE: 70 y.o.    PRE-OPERATIVE DIAGNOSIS:  M17.11 Unilateral primary osteoarthritis, right knee  POST-OPERATIVE DIAGNOSIS:  Same  PROCEDURE:  Procedure(s): TOTAL KNEE ARTHROPLASTY, RIGHT  SURGEON:  Lovell Sheehan, MD   ASSISTANT:  Carlynn Spry,  PA-C  OPERATIVE IMPLANTS: Tamala [employer], Cruciate Retaining Oxinium Femoral component size  5, Fixed Bearing Tray size 5, Patella polyethylene 3-peg oval button size 35 mm, with a 9 mm DISH insert.   PREOPERATIVE INDICATIONS:  Jonathan Shelton is an 70 y.o. male who has a diagnosis of M17.11 Unilateral primary osteoarthritis, right knee and elected for a total knee arthroplasty after failing nonoperative treatment, including activity modification, pain medication, physical therapy and injections who has significant impairment of their activities of daily living.  Radiographs have demonstrated tricompartmental osteoarthritis joint space narrowing, osteophytes, subchondral sclerosis and cyst formation.  The risks, benefits, and alternatives were discussed at length including but not limited to the risks of infection, bleeding, nerve or blood vessel injury, knee stiffness, fracture, dislocation, loosening or failure of the hardware and the need for further surgery. Medical risks include but not limited to DVT and pulmonary embolism, myocardial infarction, stroke, pneumonia, respiratory failure and death. I discussed these risks with the patient in my office prior to the date of surgery. They understood these risks and were willing to proceed.  OPERATIVE FINDINGS AND UNIQUE ASPECTS OF THE CASE:  All three compartments with advanced and severe degenerative changes, large osteophytes and an abundance of synovial fluid. Significant deformity was also noted. A decision was made to proceed with total knee arthroplasty.   OPERATIVE DESCRIPTION:  The patient was brought to the  operative room and placed in a supine position after undergoing placement of a general anesthetic. IV antibiotics were given. Patient received tranexamic acid. The lower extremity was prepped and draped in the usual sterile fashion.  A time out was performed to verify the patient's name, date of birth, medical record number, correct site of surgery and correct procedure to be performed. The timeout was also used to confirm the patient received antibiotics and that appropriate instruments, implants and radiographs studies were available in the room.  The leg was elevated and exsanguinated with an Esmarch and the tourniquet was inflated to 250 mmHg.  A midline incision was made over the left knee.. A medial parapatellar arthrotomy was then made and the patella subluxed laterally and the knee was brought into 90 of flexion. Hoffa's fat pad along with the anterior cruciate ligament was resected and the medial joint line was exposed.  Attention was then turned to preparation of the patella. The thickness of the patella was measured with a caliper, the diameter measured with the patella templates.  The patella resection was then made with an oscillating saw using the patella cutting guide.  The 35 mm button fit appropriately.  3 peg holes for the patella component were then drilled.  The extramedullary tibial cutting guide was then placed using the anterior tibial crest and second ray of the foot as a reference.  The tibial cutting guide was adjusted to allow for appropriate posterior slope.  The tibial cutting block was pinned into position. The slotted stylus was used to measure the proximal tibial resection of 9 mm off the high lateral side. Care was taken during the tibial resection to protect the medial and collateral ligaments.  The resected tibial bone was removed.  The distal femur was resected using the intramedullary cutting guide.  Care was taken to protect the collateral ligaments during distal  femoral resection.  The distal femoral resection was performed with an oscillating saw. The femoral cutting guide was then removed. Extension gap was measured with a 9 mm spacer block and alignment and extension was confirmed using a long alignment rod. The femur was sized to be a 5. Rotation of the referencing guide was checked with the epicondylar axis and Whitesides line. Then the 4-in-1 cutting jig was then applied to the distal femur. A stylus was used to confirm that the anterior femur would not be notched.   Then the anterior, posterior and chamfer femoral cuts were then made with an oscillating saw.  The knee was distracted and all posterior osteophytes were removed.  The flexion gap was then measured with a flexion spacer block and long alignment rod and was found to be symmetric with the extension gap and perpendicular to mechanical axis of the tibia.  The proximal tibia plateau was then sized with trial trays. The best coverage was achieved with a size 5. This tibial tray was then pinned into position. The proximal tibia was then prepared with the keel punch.  After tibial preparation was completed, all trial components were inserted with polyethylene trials. The knee achieved full extension and flexed to 120 degrees. Ligament were stable to varus and valgus at full extension as well as 30, 60 and 90 degrees of flexion.   The trials were then placed. Knee was taken through a full range of motion and deemed to be stable with the trial components. All trial components were then removed.  The joint was copiously irrigated with pulse lavage.  The final total knee arthroplasty components were then cemented into place. The knee was held in extension while cement was allowed to cure.The knee was taken through a range of motion and the patella tracked well and the knee was again irrigated copiously.  The knee capsule was then injected with Exparel.  The medial arthrotomy was closed with #1 Vicryl and #2  Quill. The subcutaneous tissue closed with  2-0 vicryl, and skin approximated with staples.  A dry sterile and compressive dressing was applied.  A Polar Care was applied to the operative knee.  The patient was awakened and brought to the PACU in stable and satisfactory condition.  All sharp, lap and instrument counts were correct at the conclusion the case. I spoke with the patient's family in the postop consultation room to let them know the case had been performed without complication and the patient was stable in recovery room.   Total tourniquet time was 55 minutes.

## 2021-09-01 ENCOUNTER — Encounter: Payer: Self-pay | Admitting: Orthopedic Surgery

## 2021-09-01 DIAGNOSIS — M1711 Unilateral primary osteoarthritis, right knee: Secondary | ICD-10-CM | POA: Diagnosis not present

## 2021-09-01 LAB — BASIC METABOLIC PANEL
Anion gap: 5 (ref 5–15)
BUN: 17 mg/dL (ref 8–23)
CO2: 24 mmol/L (ref 22–32)
Calcium: 8.2 mg/dL — ABNORMAL LOW (ref 8.9–10.3)
Chloride: 109 mmol/L (ref 98–111)
Creatinine, Ser: 0.81 mg/dL (ref 0.61–1.24)
GFR, Estimated: >60 mL/min (ref >60)
Glucose, Bld: 194 mg/dL — ABNORMAL HIGH (ref 70–99)
Potassium: 4.5 mmol/L (ref 3.5–5.1)
Sodium: 138 mmol/L (ref 135–145)

## 2021-09-01 LAB — CBC
HCT: 39.5 % (ref 39.0–52.0)
Hemoglobin: 13.6 g/dL (ref 13.0–17.0)
MCH: 31.1 pg (ref 26.0–34.0)
MCHC: 34.4 g/dL (ref 30.0–36.0)
MCV: 90.4 fL (ref 80.0–100.0)
Platelets: 208 10*3/uL (ref 150–400)
RBC: 4.37 MIL/uL (ref 4.22–5.81)
RDW: 13.1 % (ref 11.5–15.5)
WBC: 11.3 10*3/uL — ABNORMAL HIGH (ref 4.0–10.5)
nRBC: 0 % (ref 0.0–0.2)

## 2021-09-01 LAB — GLUCOSE, CAPILLARY
Glucose-Capillary: 195 mg/dL — ABNORMAL HIGH (ref 70–99)
Glucose-Capillary: 269 mg/dL — ABNORMAL HIGH (ref 70–99)
Glucose-Capillary: 223 mg/dL — ABNORMAL HIGH (ref 70–99)
Glucose-Capillary: 148 mg/dL — ABNORMAL HIGH (ref 70–99)

## 2021-09-01 NOTE — Progress Notes (Signed)
Met with the patient in the room to discuss DC plan and needs He loves at home with his wife, she provides him with transportation when needed He can afford his medication Jonathan Shelton with [facility] confirmed that they are set up with [facility] PT He has a 3 in 1 at home but will need a RW, [employer] will deliver to the room prior to DC He has no additional needs at this time

## 2021-09-01 NOTE — Evaluation (Signed)
Physical Therapy Evaluation Patient Details Name: Jonathan Shelton MRN: [id number] DOB: 03/10/1951 Today's Date: 09/01/2021  History of Present Illness  Pt is a 70 y.o. male s/p R TKA secondary to unilateral primary OA R knee.  PMH includes atypical Parkinsonism, h/o CVA with residual deficit, drug-induced myopathy, compression fx L1, spondylosis of lumbar region, acute LBP, htn, DM, and carpal tunnel release.  Clinical Impression  Prior to hospital admission, pt was modified independent with functional mobility (uses SPC at night and initially in the morning for balance); lives with his wife (pt reports she is unable to physically assist much).  Pain 4/10 R knee beginning/end of session.  Currently pt is min to mod assist to stand from recliner (up to RW) and CGA to ambulate 30 feet with RW (limited distance d/t pt fatigue).  Tolerated LE ex's fairly well with assist for R LE as needed.  Able to perform R LE SLR with minimal assist.  R knee AROM 12-75 degrees.  H/o falls (often d/t falling backwards).  Slower speech noted as well as increased time to respond and initiate movement for activities (pt with PMH of atypical Parkinsonism).  Pt would benefit from skilled PT to address noted impairments and functional limitations (see below for any additional details).  Upon hospital discharge, pt would benefit from [place] and 24/7 assist.      Recommendations for follow up therapy are one component of a multi-disciplinary discharge planning process, led by the attending physician.  Recommendations may be updated based on patient status, additional functional criteria and insurance authorization.  Follow Up Recommendations Home health PT;Supervision/Assistance - 24 hour    Equipment Recommendations  Rolling walker with 5" wheels;3in1 (PT)    Recommendations for Other Services OT consult     Precautions / Restrictions Precautions Precautions: Knee;Fall Precaution Booklet Issued: Yes  (comment) Restrictions Weight Bearing Restrictions: Yes RLE Weight Bearing: Weight bearing as tolerated      Mobility  Bed Mobility               General bed mobility comments: Deferred (pt in recliner beginning/end of session)    Transfers Overall transfer level: Needs assistance Equipment used: Rolling walker (2 wheeled) Transfers: Sit to/from Stand Sit to Stand: Min assist;Mod assist         General transfer comment: vc's for UE/LE placement; assist to initiate and come to full stand up to RW; vc's for overall transfer technique  Ambulation/Gait Ambulation/Gait assistance: Min guard Gait Distance (Feet): 30 Feet Assistive device: Rolling walker (2 wheeled)   Gait velocity: decreased   General Gait Details: antalgic; decreased stance time R LE; step to gait pattern; vc's for gait technique and walker use  Stairs            Wheelchair Mobility    Modified Rankin (Stroke Patients Only)       Balance Overall balance assessment: Needs assistance Sitting-balance support: No upper extremity supported;Feet supported Sitting balance-Leahy Scale: Good Sitting balance - Comments: steady sitting reaching within BOS   Standing balance support: No upper extremity supported Standing balance-Leahy Scale: Fair Standing balance comment: steady static standing and reaching within BOS                             Pertinent Vitals/Pain Pain Assessment: 0-10 Pain Score: 4  Pain Location: R knee Pain Descriptors / Indicators: Sore Pain Intervention(s): Limited activity within patient's tolerance;Monitored during session;Premedicated before session;Repositioned;Other (comment) (polar care  applied) Vitals (HR and O2 on room air) stable and WFL throughout treatment session.    Home Living Family/patient expects to be discharged to:: Private residence Living Arrangements: Spouse/significant other Available Help at Discharge: Family (wife unable to  physically assist much) Type of Home: House Home Access: Stairs to enter   [employer] of Steps: 2 steps to enter with R grab bar entering home Home Layout: [place]: Grab bars - toilet;Grab bars - tub/shower;Cane - single point      Prior Function Level of Independence: Independent         Comments: Pt uses cane first thing in morning for balance and also at night.  10 falls in past 6 months (often falls backwards).     Hand Dominance        Extremity/Trunk Assessment   Upper Extremity Assessment Upper Extremity Assessment: Overall WFL for tasks assessed    Lower Extremity Assessment Lower Extremity Assessment: RLE deficits/detail (L LE WFL) RLE Deficits / Details: able to perform R LE SLR with minimal assist; at least 3/5 AROM ankle DF/PF; fair R quad set strength RLE: Unable to fully assess due to pain    Cervical / Trunk Assessment Cervical / Trunk Assessment: Normal  Communication   Communication: Other (comment) (slower speech; increased time to respond)  Cognition Arousal/Alertness: Awake/alert Behavior During Therapy:  (more of a flat affect) Overall Cognitive Status: Within Functional Limits for tasks assessed                                        General Comments General comments (skin integrity, edema, etc.): R knee ace wrap in place.  Pt agreeable to PT session.    Exercises Total Joint Exercises Ankle Circles/Pumps: AROM;Strengthening;Both;10 reps;Supine Quad Sets: AROM;Strengthening;Both;10 reps;Supine Heel Slides: AAROM;Strengthening;Right;10 reps;Supine Hip ABduction/ADduction: AAROM;Strengthening;Right;10 reps;Supine Straight Leg Raises: AAROM;Strengthening;Right;10 reps;Supine Goniometric ROM: R knee AROM 12 degrees to 75 degrees   Assessment/Plan    PT Assessment Patient needs continued PT services  PT Problem List Decreased strength;Decreased range of motion;Decreased activity  tolerance;Decreased balance;Decreased mobility;Decreased knowledge of use of DME;Decreased knowledge of precautions;Pain;Decreased skin integrity       PT Treatment Interventions DME instruction;Gait training;Stair training;Functional mobility training;Therapeutic activities;Therapeutic exercise;Balance training;Patient/family education    PT Goals (Current goals can be found in the Care Plan section)  Acute Rehab PT Goals Patient Stated Goal: to go home PT Goal Formulation: With patient Time For Goal Achievement: 09/15/21 Potential to Achieve Goals: Good    Frequency BID   Barriers to discharge Decreased caregiver support      Co-evaluation               AM-PAC PT "6 Clicks" Mobility  Outcome Measure Help needed turning from your back to your side while in a flat bed without using bedrails?: A Little Help needed moving from lying on your back to sitting on the side of a flat bed without using bedrails?: A Little Help needed moving to and from a bed to a chair (including a wheelchair)?: A Little Help needed standing up from a chair using your arms (e.g., wheelchair or bedside chair)?: A Lot Help needed to walk in hospital room?: A Little Help needed climbing 3-5 steps with a railing? : A Lot 6 Click Score: 16    End of Session Equipment Utilized During Treatment: Gait belt Activity Tolerance: Patient tolerated treatment well Patient  left: in chair;with call bell/phone within reach;with chair alarm set;with SCD's reapplied;Other (comment) (R heel floating via towel roll; polar care in place) Nurse Communication: Mobility status;Precautions;Weight bearing status PT Visit Diagnosis: Other abnormalities of gait and mobility (R26.89);Muscle weakness (generalized) (M62.81);History of falling (Z91.81);Pain Pain - Right/Left: Right Pain - part of body: Knee    Time: 8403-3533 PT Time Calculation (min) (ACUTE ONLY): 37 min   Charges:   PT Evaluation $PT Eval Low Complexity:  1 Low PT Treatments $Therapeutic Exercise: 8-22 mins $Therapeutic Activity: 8-22 mins       Leitha Bleak, PT 09/01/21, 9:50 AM

## 2021-09-01 NOTE — Progress Notes (Signed)
Physical Therapy Treatment Patient Details Name: Jonathan Shelton MRN: [id number] DOB: [birth year] Today's Date: 09/01/2021   History of Present Illness Pt is a 70 y.o. male s/p R TKA secondary to unilateral primary OA R knee.  PMH includes atypical Parkinsonism, h/o CVA with residual deficit, drug-induced myopathy, compression fx L1, spondylosis of lumbar region, acute LBP, htn, DM, and carpal tunnel release.    PT Comments    Pt sleeping in bed upon PT arrival but woken easily with vc's.  Mod assist semi-supine to/from sitting edge of bed; mod assist for transfers using RW; and CGA to ambulate 70 feet with RW.  During gait, pt with mild R knee flexion during R LE stance phase requiring cueing to increase UE support through RW to offweight R LE during L LE advancement.  Pt appearing drowsy this afternoon and requiring increased time for activities (pt reports d/t pain medication).  Will continue to attempt to progress towards home discharge although will need to monitor pt's progress for possible SNF consideration (TOC notified).   Recommendations for follow up therapy are one component of a multi-disciplinary discharge planning process, led by the attending physician.  Recommendations may be updated based on patient status, additional functional criteria and insurance authorization.  Follow Up Recommendations  Home health PT;Supervision/Assistance - 24 hour     Equipment Recommendations  Rolling walker with 5" wheels;3in1 (PT)    Recommendations for Other Services OT consult     Precautions / Restrictions Precautions Precautions: Knee;Fall Precaution Booklet Issued: Yes (comment) Restrictions Weight Bearing Restrictions: Yes RLE Weight Bearing: Weight bearing as tolerated     Mobility  Bed Mobility Overal bed mobility: Needs Assistance Bed Mobility: Supine to Sit;Sit to Supine     Supine to sit: Mod assist;HOB elevated (assist for trunk) Sit to supine: Mod assist (assist  for LE's)   General bed mobility comments: vc's for technique; increased effort for pt to perform    Transfers Overall transfer level: Needs assistance Equipment used: Rolling walker (2 wheeled) Transfers: Sit to/from [facility] Sit to Stand: Mod assist Stand pivot transfers: Min guard (stand step turn recliner to bed with RW)       General transfer comment: vc's for UE/LE placement; assist to initiate and come to full stand up to RW (x1 trial from bed and x1 trial from recliner); vc's for overall transfer technique  Ambulation/Gait Ambulation/Gait assistance: Min guard (chair follow) Gait Distance (Feet): 70 Feet Assistive device: Rolling walker (2 wheeled)   Gait velocity: decreased   General Gait Details: antalgic; decreased stance time R LE; mild R knee flexion during R LE stance phase but no knee buckling; step to gait pattern; vc's for gait technique and walker use   Stairs             Wheelchair Mobility    Modified Rankin (Stroke Patients Only)       Balance Overall balance assessment: Needs assistance Sitting-balance support: No upper extremity supported;Feet supported Sitting balance-Leahy Scale: Good Sitting balance - Comments: steady sitting reaching within BOS   Standing balance support: No upper extremity supported Standing balance-Leahy Scale: Fair Standing balance comment: steady static standing and reaching within BOS                            Cognition Arousal/Alertness:  (Drowsy (pt reports d/t pain medication)) Behavior During Therapy:  (more of a flat affect) Overall Cognitive Status: Within Functional Limits for tasks  assessed                                        Exercises Total Joint Exercises Long Arc Quad: AAROM;Strengthening;Right;10 reps;Seated Knee Flexion: AROM;Strengthening;Right;10 reps;Seated    General Comments  Pt's wife present during session.      Pertinent  Vitals/Pain  Vitals (HR and O2 on room air) stable and WFL throughout treatment session.    Home Living                      Prior Function            PT Goals (current goals can now be found in the care plan section) Acute Rehab PT Goals Patient Stated Goal: to go home PT Goal Formulation: With patient Time For Goal Achievement: 09/15/21 Potential to Achieve Goals: Good Progress towards PT goals: Progressing toward goals    Frequency    BID      PT Plan Current plan remains appropriate    Co-evaluation              AM-PAC PT "6 Clicks" Mobility   Outcome Measure  Help needed turning from your back to your side while in a flat bed without using bedrails?: A Little Help needed moving from lying on your back to sitting on the side of a flat bed without using bedrails?: A Little Help needed moving to and from a bed to a chair (including a wheelchair)?: A Little Help needed standing up from a chair using your arms (e.g., wheelchair or bedside chair)?: A Lot Help needed to walk in hospital room?: A Little Help needed climbing 3-5 steps with a railing? : A Lot 6 Click Score: 16    End of Session Equipment Utilized During Treatment: Gait belt Activity Tolerance: Patient tolerated treatment well Patient left: in bed;with call bell/phone within reach;with bed alarm set;with family/visitor present;with SCD's reapplied;Other (comment) (B heels floating via towel rolls; polar care in place) Nurse Communication: Mobility status;Precautions;Weight bearing status PT Visit Diagnosis: Other abnormalities of gait and mobility (R26.89);Muscle weakness (generalized) (M62.81);History of falling (Z91.81);Pain Pain - Right/Left: Right Pain - part of body: Knee     Time: 3154-0086 PT Time Calculation (min) (ACUTE ONLY): 42 min  Charges:  $Gait Training: 8-22 mins $Therapeutic Exercise: 8-22 mins $Therapeutic Activity: 8-22 mins                    Leitha Bleak,  PT 09/01/21, 8:02 PM

## 2021-09-02 DIAGNOSIS — M1711 Unilateral primary osteoarthritis, right knee: Secondary | ICD-10-CM | POA: Diagnosis not present

## 2021-09-02 LAB — CBC
HCT: 38.9 % — ABNORMAL LOW (ref 39.0–52.0)
Hemoglobin: 13.6 g/dL (ref 13.0–17.0)
MCH: 31.9 pg (ref 26.0–34.0)
MCHC: 35 g/dL (ref 30.0–36.0)
MCV: 91.3 fL (ref 80.0–100.0)
Platelets: 207 10*3/uL (ref 150–400)
RBC: 4.26 MIL/uL (ref 4.22–5.81)
RDW: 13.5 % (ref 11.5–15.5)
WBC: 13.4 10*3/uL — ABNORMAL HIGH (ref 4.0–10.5)
nRBC: 0 % (ref 0.0–0.2)

## 2021-09-02 LAB — HEMOGLOBIN A1C
Hgb A1c MFr Bld: 7.4 % — ABNORMAL HIGH (ref 4.8–5.6)
Mean Plasma Glucose: 166 mg/dL

## 2021-09-02 LAB — GLUCOSE, CAPILLARY
Glucose-Capillary: 157 mg/dL — ABNORMAL HIGH (ref 70–99)
Glucose-Capillary: 179 mg/dL — ABNORMAL HIGH (ref 70–99)

## 2021-09-02 MED ORDER — DOCUSATE SODIUM 100 MG PO CAPS: 100.0000 mg | ORAL_CAPSULE | Freq: Two times a day (BID) | ORAL | 0 refills | Status: DC

## 2021-09-02 MED ORDER — HYDROCODONE-ACETAMINOPHEN 5-325 MG PO TABS: 1.0000 | ORAL_TABLET | ORAL | 0 refills | Status: DC | PRN

## 2021-09-02 MED ORDER — NAPROXEN 500 MG PO TABS
500.0000 mg | ORAL_TABLET | Freq: Two times a day (BID) | ORAL | Status: DC
  Administered 2021-09-02: 500 mg via ORAL
  Filled 2021-09-02 (×2): qty 1

## 2021-09-02 MED ORDER — ASPIRIN 81 MG PO CHEW: 81.0000 mg | CHEWABLE_TABLET | Freq: Two times a day (BID) | ORAL | 0 refills | Status: DC

## 2021-09-02 NOTE — Evaluation (Signed)
Occupational Therapy Evaluation Patient Details Name: Jonathan Shelton MRN: [id number] DOB: 07-Oct-1951 Today's Date: 09/02/2021   History of Present Illness Pt is a 70 y.o. male s/p R TKA secondary to unilateral primary OA R knee.  PMH includes atypical Parkinsonism, h/o CVA with residual deficit, drug-induced myopathy, compression fx L1, spondylosis of lumbar region, acute LBP, htn, DM, and carpal tunnel release.   Clinical Impression   Pt seen for OT evaluation this date, POD#2 from above surgery. Prior to surgery, pt was mod-independent with [facility] for functional mobility and ADLs (using compensatory strategies for LB dressing d/t difficulty with LB ADLs at baseline). Pt currently requires MIN GUARD for toilet transfers/hygiene, MIN GUARD for standing grooming tasks, and MIN GUARD for functional mobility of short household distances with RW d/t LLE pain and decreased balance. Pt instructed in polar care mgt, falls prevention strategies, and DME/AE for LB bathing and dressing tasks; handout provided and pt verbalized understanding. Pt would benefit from additional skilled OT services including additional instruction in techniques with or without assistive devices for dressing and bathing skills to support recall and carryover prior to discharge and ultimately to maximize safety, independence, and minimize falls risk and caregiver burden. Recommend HHOT upon discharge.      Recommendations for follow up therapy are one component of a multi-disciplinary discharge planning process, led by the attending physician.  Recommendations may be updated based on patient status, additional functional criteria and insurance authorization.   Follow Up Recommendations  Home health OT;Supervision/Assistance - 24 hour    Equipment Recommendations  3 in 1 bedside commode;Other (comment) (would benefit from bariatric BSC d/t body habitus)       Precautions / Restrictions Precautions Precautions:  Knee;Fall Restrictions Weight Bearing Restrictions: Yes RLE Weight Bearing: Weight bearing as tolerated      Mobility Bed Mobility               General bed mobility comments: not assessed, pt in recliner at beginning/end of session    Transfers Overall transfer level: Needs assistance Equipment used: Rolling walker (2 wheeled) Transfers: Sit to/from Stand Sit to Stand: Min guard         General transfer comment: MIN GUARD from recliner d/t requiring increased time/effort. Pt demonstrating safe hand placement with RW use    Balance Overall balance assessment: Needs assistance Sitting-balance support: No upper extremity supported;Feet supported Sitting balance-Leahy Scale: Good Sitting balance - Comments: steady sitting reaching within BOS   Standing balance support: No upper extremity supported;During functional activity Standing balance-Leahy Scale: Fair Standing balance comment: MIN GUARD during standing hand hygiene                           ADL either performed or assessed with clinical judgement   ADL Overall ADL's : Needs assistance/impaired     Grooming: Wash/dry hands;Supervision/safety;Standing               Lower Body Dressing: Maximal assistance;Sitting/lateral leans Lower Body Dressing Details (indicate cue type and reason): to don/doff socks; pt reports having difficulty with this at baseline Toilet Transfer: Min Forensic psychologist Details (indicate cue type and reason): simulated, requires increases time/efforts, however no physical assist providd Toileting- Water quality scientist and Hygiene: Min guard;Sit to/from stand Toileting - Clothing Manipulation Details (indicate cue type and reason): MIN GUARD for standing while using urinal and performing frontal peri-care     Functional mobility during ADLs: Min guard;Rolling walker  Vision Baseline Vision/History: 1 Wears glasses              Pertinent  Vitals/Pain Pain Assessment: 0-10 Pain Score: 5  Pain Location: R knee Pain Descriptors / Indicators: Sore Pain Intervention(s): Limited activity within patient's tolerance;Monitored during session;Repositioned        Extremity/Trunk Assessment Upper Extremity Assessment Upper Extremity Assessment: Overall WFL for tasks assessed   Lower Extremity Assessment Lower Extremity Assessment: Defer to PT evaluation       Communication Communication Communication: Other (comment) (slower speech; increased time to respond)   Cognition Arousal/Alertness: Suspect due to medications (Drowsy) Behavior During Therapy: Flat affect Overall Cognitive Status: Within Functional Limits for tasks assessed                                 General Comments: Pt requires increased processing time. Pleasant and agreeable throughout   General Comments       Exercises Other Exercises Other Exercises: Pt instructed in polar care mgt, falls prevention strategies, and DME/AE for LB bathing and dressing tasks; handout provided; handout provided        Home Living Family/patient expects to be discharged to:: Private residence Living Arrangements: Spouse/significant other Available Help at Discharge: Family (wife unable to physically assist much) Type of Home: House Home Access: Stairs to enter [employer] of Steps: 2 steps to enter with R grab bar entering home   Home Layout: One level     Bathroom Shower/Tub: Occupational psychologist:  (higher toilet)     Home Equipment: Grab bars - toilet;Grab bars - tub/shower;Cane - single point;Bedside commode;Shower seat - built in          Prior Functioning/Environment Level of Independence: Independent with assistive device(s)  Gait / Transfers Assistance Needed: Pt uses cane first thing in morning for balance and also at night. 10 falls in past 6 months (often falls backwards). ADL's / Homemaking Assistance Needed: Pt  reports he is independent with ADLs. Endorses some difficulty with LB dressing, so uses slip-on shoes and typically wears shorts            OT Problem List: Impaired balance (sitting and/or standing);Pain      OT Treatment/Interventions: Self-care/ADL training;Therapeutic exercise;DME and/or AE instruction;Therapeutic activities;Patient/family education;Balance training    OT Goals(Current goals can be found in the care plan section) Acute Rehab OT Goals Patient Stated Goal: to go home OT Goal Formulation: With patient Time For Goal Achievement: 09/16/21 Potential to Achieve Goals: Good ADL Goals Pt Will Perform Grooming: with modified independence;standing Pt Will Perform Lower Body Dressing: with supervision;with adaptive equipment;sit to/from stand Pt Will Transfer to Toilet: with modified independence;ambulating;bedside commode  OT Frequency: Min 1X/week    AM-PAC OT "6 Clicks" Daily Activity     Outcome Measure Help from another person eating meals?: None Help from another person taking care of personal grooming?: A Little Help from another person toileting, which includes using toliet, bedpan, or urinal?: A Little Help from another person bathing (including washing, rinsing, drying)?: A Lot Help from another person to put on and taking off regular upper body clothing?: A Little Help from another person to put on and taking off regular lower body clothing?: A Lot 6 Click Score: 17   End of Session Equipment Utilized During Treatment: Rolling walker Nurse Communication: Mobility status  Activity Tolerance: Patient tolerated treatment well Patient left: in chair;with call bell/phone within  reach  OT Visit Diagnosis: Unsteadiness on feet (R26.81);History of falling (Z91.81)                Time: 8102-5486 OT Time Calculation (min): 23 min Charges:  OT General Charges $OT Visit: 1 Visit OT Evaluation $OT Eval Moderate Complexity: 1 Mod OT Treatments $Self Care/Home  Management : 8-22 mins  Fredirick Maudlin, OTR/L [place]

## 2021-09-02 NOTE — Progress Notes (Signed)
Discharge instructions reviewed with pt and wife. Pt is waiting to have 2nd round with physical therapy before being discharged.

## 2021-09-02 NOTE — Discharge Summary (Signed)
Physician Discharge Summary  Patient ID: Jonathan Shelton MRN: [id number] DOB/AGE: 70-29-1952 70 y.o.  Admit date: 08/31/2021 Discharge date: 09/02/2021  Admission Diagnoses:  M17.11 Unilateral primary osteoarthritis, right knee <principal problem not specified>  Discharge Diagnoses:  M17.11 Unilateral primary osteoarthritis, right knee Active Problems:   History of total knee arthroplasty, right   Past Medical History:  Diagnosis Date   Allergy    Arthritis    BPH (benign prostatic hyperplasia)    Colon polyps    Diabetes ([place])    Diverticulosis    Gallstones    GERD (gastroesophageal reflux disease)    HOH (hard of hearing)    Kidney stones    Renal disorder    Wears glasses     Surgeries: Procedure(s): TOTAL KNEE ARTHROPLASTY on 08/31/2021   Consultants (if any):   Discharged Condition: Improved  Hospital Course: Jonathan Shelton is an 70 y.o. male who was admitted 08/31/2021 with a diagnosis of  M17.11 Unilateral primary osteoarthritis, right knee <principal problem not specified> and went to the operating room on 08/31/2021 and underwent the above named procedures.    He was given perioperative antibiotics:  Anti-infectives (From admission, onward)    Start     Dose/Rate Route Frequency Ordered Stop   08/31/21 2000  ceFAZolin (ANCEF) IVPB 2g/100 mL premix        2 g 200 mL/hr over 30 Minutes Intravenous Every 6 hours 08/31/21 1559 09/01/21 0403   08/31/21 1103  ceFAZolin (ANCEF) 2-4 GM/100ML-% IVPB       Note to Pharmacy: Sylvester Harder   : cabinet override      08/31/21 1103 08/31/21 1336   08/31/21 0600  ceFAZolin (ANCEF) IVPB 2g/100 mL premix        2 g 200 mL/hr over 30 Minutes Intravenous On call to O.R. 08/30/21 2256 08/31/21 1322     .  He was given sequential compression devices, early ambulation, and aspirin for DVT prophylaxis.  He benefited maximally from the hospital stay and there were no complications.    Recent vital signs:  Vitals:    09/01/21 1436 09/01/21 2013  BP: 115/63 137/70  Pulse: 93 (!) 102  Resp: 16 18  Temp: 99 F (37.2 C) 98.7 F (37.1 C)  SpO2: 92% 95%    Recent laboratory studies:  Lab Results  Component Value Date   HGB 13.6 09/02/2021   HGB 13.6 09/01/2021   HGB 15.4 08/25/2021   Lab Results  Component Value Date   WBC 13.4 (H) 09/02/2021   PLT 207 09/02/2021   Lab Results  Component Value Date   INR 1.0 08/25/2021   Lab Results  Component Value Date   NA 138 09/01/2021   K 4.5 09/01/2021   CL 109 09/01/2021   CO2 24 09/01/2021   BUN 17 09/01/2021   CREATININE 0.81 09/01/2021   GLUCOSE 194 (H) 09/01/2021    Discharge Medications:   Allergies as of 09/02/2021       Reactions   Testosterone Rash   Adhesive patch        Medication List     STOP taking these medications    aspirin 81 MG EC tablet Replaced by: aspirin 81 MG chewable tablet       TAKE these medications    aspirin 81 MG chewable tablet Chew 1 tablet (81 mg total) by mouth 2 (two) times daily. Replaces: aspirin 81 MG EC tablet   carbidopa-levodopa 25-100 MG tablet Commonly known as: SINEMET IR  Take 1 tablet by mouth at bedtime.   cholecalciferol 25 MCG (1000 UNIT) tablet Commonly known as: VITAMIN D3 Take 1,000 Units by mouth 3 (three) times a week.   vitamin B-12 1000 MCG tablet Commonly known as: CYANOCOBALAMIN Take 1,000 mcg by mouth 3 (three) times a week.   cyanocobalamin 1000 MCG/ML injection Commonly known as: (VITAMIN B-12) Inject 1,000 mcg into the muscle every 30 (thirty) days.   docusate sodium 100 MG capsule Commonly known as: COLACE Take 1 capsule (100 mg total) by mouth 2 (two) times daily.   finasteride 5 MG tablet Commonly known as: PROSCAR TAKE 1 TABLET (5 MG TOTAL) BY MOUTH DAILY.   Fish Oil 1000 MG Caps Take by mouth.   gabapentin 100 MG capsule Commonly known as: NEURONTIN Take 2 capsules (200 mg total) by mouth at bedtime.   glucose blood test  strip Check blood sugar up to 4 times daily   HYDROcodone-acetaminophen 5-325 MG tablet Commonly known as: NORCO/VICODIN Take 1 tablet by mouth every 4 (four) hours as needed for moderate pain (pain score 4-6).   insulin NPH Human 100 UNIT/ML injection Commonly known as: NovoLIN N INJECT 0.4 MLS (40 UNITS TOTAL) INTO THE SKIN AT BEDTIME. MAY USE REDUCED DOSE AS ADVISED. What changed:  how much to take how to take this when to take this additional instructions   Insulin Syringe-Needle U-100 31G X 1/4" 0.5 ML Misc Use syringe to inject insulin into skin daily.   Jardiance 25 MG Tabs tablet Generic drug: empagliflozin TAKE 1 TABLET BY MOUTH EVERY DAY   levalbuterol 45 MCG/ACT inhaler Commonly known as: XOPENEX HFA Inhale 1-2 puffs into the lungs every 6 (six) hours as needed for wheezing.   meloxicam 7.5 MG tablet Commonly known as: MOBIC Take 1 tablet (7.5 mg total) by mouth daily.   metFORMIN 1000 MG tablet Commonly known as: GLUCOPHAGE TAKE 1 TABLET (1,000 MG TOTAL) BY MOUTH 2 (TWO) TIMES DAILY WITH A MEAL.   naproxen sodium 220 MG tablet Commonly known as: ALEVE Take 220 mg by mouth. 3x times per day   Ozempic (1 MG/DOSE) 4 MG/3ML Sopn Generic drug: Semaglutide (1 MG/DOSE) Inject 1 mg into the skin once a week.   Praluent 75 MG/ML [place] Generic drug: Alirocumab Inject 75 mg as directed every 14 (fourteen) days.   tamsulosin 0.4 MG Caps capsule Commonly known as: FLOMAX TAKE 2 CAPSULES BY MOUTH EVERY DAY   valsartan 80 MG tablet Commonly known as: DIOVAN TAKE 1 TABLET BY MOUTH EVERY DAY   VITAMIN C PO Take 1 tablet by mouth 3 (three) times a week.   zinc gluconate 50 MG tablet Take 50 mg by mouth 3 (three) times a week.               Durable Medical Equipment  (From admission, onward)           Start     Ordered   09/02/21 0658  For home use only DME 3 n 1  Once        09/02/21 0657   09/02/21 0658  For home use only DME Walker rolling   Once       Question Answer Comment  Walker: With 5 Inch Wheels   Patient needs a walker to treat with the following condition Osteoarthritis of left knee      09/02/21 0657   09/01/21 1021  For home use only DME Walker rolling  Once       Question  Answer Comment  Walker: With 5 Inch Wheels   Patient needs a walker to treat with the following condition Weakness      09/01/21 1021            Diagnostic Studies: DG Knee Right Port  Result Date: 08/31/2021 CLINICAL DATA:  Postop EXAM: PORTABLE RIGHT KNEE - 1-2 VIEW COMPARISON:  None. FINDINGS: Right knee replacement with intact hardware and normal alignment. Gas in the soft tissues consistent with recent surgery. IMPRESSION: Status post right knee replacement with expected postsurgical change Electronically Signed   By: Donavan Foil M.D.   On: 08/31/2021 17:14   [place] OR NERVE BLOCK-IMAGE ONLY [facility])  Result Date: 08/31/2021 There is no interpretation for this exam.  This order is for images obtained during a surgical procedure.  Please See "Surgeries" Tab for more information regarding the procedure.    Disposition: Discharge disposition: 01-Home or Self Care      Follow up in 2 weeks in office.  Call to confirm appointment [phone]      Signed: Carlynn Spry ,PA-C 09/02/2021, 6:58 AM

## 2021-09-02 NOTE — TOC Progression Note (Signed)
Transition of Care [facility]) - Progression Note    Patient Details  Name: Jonathan Shelton MRN: [id number] Date of Birth: 12-21-1950  Transition of Care [facility]) CM/SW Mobile, RN Phone Number: 09/02/2021, 9:44 AM  Clinical Narrative:   [employer] had delivered a rolling walker to the patient's room yesterday, He has determined that his wife's 3 in 1 at home will not work due to his size, he has requested a 3 in 1 that is bariatric, Order was placed, the patient is ok to get delivered to his home if it is not available in house to be delivered to his room prior to DC, He is set up with [facility] services and has no additional needs    Expected Discharge Plan: [place] Barriers to Discharge: Continued Medical Work up  Expected Discharge Plan and Services Expected Discharge Plan: [place]   Discharge Planning Services: CM Consult   Living arrangements for the past 2 months: Single Family Home Expected Discharge Date: 09/02/21               DME Arranged: Gilford Rile rolling DME Agency: [employer] Date DME Agency Contacted: 09/01/21 Time DME Agency Contacted: 1003 Representative spoke with at DME Agency: [place]: PT [place]: [place] Date [place]: 09/01/21 Time [place]: 1004 Representative spoke with at [place]: [place] Determinants of Health ([place]) Interventions    Readmission Risk Interventions No flowsheet data found.

## 2021-09-02 NOTE — Progress Notes (Signed)
Physical Therapy Treatment Patient Details Name: Jonathan Shelton MRN: [id number] DOB: Apr 07, 1951 Today's Date: 09/02/2021   History of Present Illness Pt is a 70 y.o. male s/p R TKA secondary to unilateral primary OA R knee.  PMH includes atypical Parkinsonism, h/o CVA with residual deficit, drug-induced myopathy, compression fx L1, spondylosis of lumbar region, acute LBP, htn, DM, and carpal tunnel release.    PT Comments    Pt is POD#2 after R TKA and demonstrating improvements in ambulation with usage of RW requiring CGA for balance and safety. Spouse present during treatment session for education and demonstration of stair climbing. Attempted stair climbing with R rail only for 2 steps and instructed patient's spouse on proper guarding techniques. Demonstrated and practiced technique to go up the stairs backwards with usage of RW and +2 assistance for patient safety. Pt performed steps with improved safety while going backwards using RW and pt and spouse reporting they will have assistance from daughter at discharge for home entry. Discussed non-emergent EMS transportation and pt and spouse declining this service. Educated on proper car transfer techniques. Pt has all DME equipment in room and fit for patient's height. Pt and spouse offer no other questions at this time.    Recommendations for follow up therapy are one component of a multi-disciplinary discharge planning process, led by the attending physician.  Recommendations may be updated based on patient status, additional functional criteria and insurance authorization.  Follow Up Recommendations  Home health PT;Supervision/Assistance - 24 hour     Equipment Recommendations  Rolling walker with 5" wheels;3in1 (PT)    Recommendations for Other Services       Precautions / Restrictions Precautions Precautions: Knee;Fall Restrictions Weight Bearing Restrictions: Yes RLE Weight Bearing: Weight bearing as tolerated      Mobility  Bed Mobility Overal bed mobility: Needs Assistance Bed Mobility: Supine to Sit;Sit to Supine     Supine to sit: Min assist;HOB elevated Sit to supine: Min assist   General bed mobility comments: MinA for R LE back into bed    Transfers Overall transfer level: Needs assistance Equipment used: Rolling walker (2 wheeled) Transfers: Sit to/from Stand Sit to Stand: Min guard         General transfer comment: CGA with usage of RW for safety. Cues for pushing up from surface  Ambulation/Gait Ambulation/Gait assistance: Min guard Gait Distance (Feet): 40 Feet x 2 Assistive device: Rolling walker (2 wheeled) Gait Pattern/deviations: Step-through pattern;Decreased stride length;Decreased weight shift to right Gait velocity: decreased   General Gait Details: CGA for safety during gait training   Stairs Stairs: Yes Stairs assistance: Min guard;+2 safety/equipment Stair Management: Backwards;Forwards;With walker Number of Stairs: 2 x 4 General stair comments: Stair climbing training with spouse present x 2 ways. First trial was wtih R railing only going up and down stairs facing forwards for safety. Instructed patient's wife on proper guarding techniques for patient safety. Trailed going up backwards using RW and pt demonstrating increased ease with this method for 2 steps. Again, spouse educated on proper assistance and guarding for safety. Reporting they will have support from daughter for home entry.   Wheelchair Mobility    Modified Rankin (Stroke Patients Only)       Balance Overall balance assessment: Needs assistance Sitting-balance support: No upper extremity supported;Feet supported Sitting balance-Leahy Scale: Good     Standing balance support: No upper extremity supported;During functional activity Standing balance-Leahy Scale: Fair Standing balance comment: Able to maintain balance with 1 UE or  no UE during static balance. Requiring RW for dynamic  balance during gait training                            Cognition Arousal/Alertness: Awake/alert Behavior During Therapy: WFL for tasks assessed/performed Overall Cognitive Status: Within Functional Limits for tasks assessed                                 General Comments: Additional time for answering questions      Exercises Total Joint Exercises Quad Sets: AROM;Strengthening;Right;10 reps;Supine Long Arc Quad: AROM;Strengthening;Right;20 reps;Seated Knee Flexion: AROM;Right;20 reps;Seated  Other Exercises Other Exercises: Educated provided regarding car transfer, stairs (see above), positioning, and exercises. Adjusted all DME equipment to pt's height prior to discharge.    General Comments        Pertinent Vitals/Pain Pain Assessment: 0-10 Pain Score: 6  Pain Location: R knee Pain Descriptors / Indicators: Sore Pain Intervention(s): Limited activity within patient's tolerance;Monitored during session;Repositioned;Ice applied    Home Living                      Prior Function            PT Goals (current goals can now be found in the care plan section) Acute Rehab PT Goals Patient Stated Goal: to go home PT Goal Formulation: With patient/family Time For Goal Achievement: 09/15/21 Potential to Achieve Goals: Good Progress towards PT goals: Progressing toward goals    Frequency    BID      PT Plan Current plan remains appropriate    Co-evaluation              AM-PAC PT "6 Clicks" Mobility   Outcome Measure  Help needed turning from your back to your side while in a flat bed without using bedrails?: A Little Help needed moving from lying on your back to sitting on the side of a flat bed without using bedrails?: A Little Help needed moving to and from a bed to a chair (including a wheelchair)?: A Little Help needed standing up from a chair using your arms (e.g., wheelchair or bedside chair)?: A Little Help needed  to walk in hospital room?: A Little Help needed climbing 3-5 steps with a railing? : A Lot 6 Click Score: 17    End of Session Equipment Utilized During Treatment: Gait belt Activity Tolerance: Patient tolerated treatment well Patient left: in bed;with call bell/phone within reach;with bed alarm set;with SCD's reapplied;Other (comment) Nurse Communication: Mobility status PT Visit Diagnosis: Other abnormalities of gait and mobility (R26.89);Muscle weakness (generalized) (M62.81);History of falling (Z91.81);Pain Pain - Right/Left: Right Pain - part of body: Knee     Time: 1694-5038 PT Time Calculation (min) (ACUTE ONLY): 41 min  Charges:        Andrey Campanile, SPT    Andrey Campanile 09/02/2021, 2:55 PM

## 2021-09-02 NOTE — Progress Notes (Cosign Needed)
Due to Body Habitus the patient will need a Bariatric 3 in 1, A regular 3 in 1 will not work for his body size

## 2021-09-02 NOTE — Progress Notes (Signed)
  Subjective:  Patient reports pain as mild to moderate.  Sleepy this am  Objective:   VITALS:   Vitals:   09/01/21 0737 09/01/21 1116 09/01/21 1436 09/01/21 2013  BP: 140/66 136/70 115/63 137/70  Pulse: 94 (!) 107 93 (!) 102  Resp: $Remo'17 16 16 18  'FbQuS$ Temp: 98.2 F (36.8 C) 99 F (37.2 C) 99 F (37.2 C) 98.7 F (37.1 C)  TempSrc:    Oral  SpO2: 92% 95% 92% 95%  Weight:      Height:        PHYSICAL EXAM:  Neurologically intact ABD soft Neurovascular intact Sensation intact distally Intact pulses distally Dorsiflexion/Plantar flexion intact Incision: dressing C/D/I No cellulitis present Compartment soft  LABS  Results for orders placed or performed during the hospital encounter of 08/31/21 (from the past 24 hour(s))  Glucose, capillary     Status: Abnormal   Collection Time: 09/01/21  7:38 AM  Result Value Ref Range   Glucose-Capillary 195 (H) 70 - 99 mg/dL  Glucose, capillary     Status: Abnormal   Collection Time: 09/01/21 11:16 AM  Result Value Ref Range   Glucose-Capillary 269 (H) 70 - 99 mg/dL  Glucose, capillary     Status: Abnormal   Collection Time: 09/01/21  5:39 PM  Result Value Ref Range   Glucose-Capillary 223 (H) 70 - 99 mg/dL  Glucose, capillary     Status: Abnormal   Collection Time: 09/01/21  8:41 PM  Result Value Ref Range   Glucose-Capillary 148 (H) 70 - 99 mg/dL   Comment 1 Notify RN    Comment 2 Document in Chart   CBC     Status: Abnormal   Collection Time: 09/02/21  3:20 AM  Result Value Ref Range   WBC 13.4 (H) 4.0 - 10.5 K/uL   RBC 4.26 4.22 - 5.81 MIL/uL   Hemoglobin 13.6 13.0 - 17.0 g/dL   HCT 38.9 (L) 39.0 - 52.0 %   MCV 91.3 80.0 - 100.0 fL   MCH 31.9 26.0 - 34.0 pg   MCHC 35.0 30.0 - 36.0 g/dL   RDW 13.5 11.5 - 15.5 %   Platelets 207 150 - 400 K/uL   nRBC 0.0 0.0 - 0.2 %    DG Knee Right Port  Result Date: 08/31/2021 CLINICAL DATA:  Postop EXAM: PORTABLE RIGHT KNEE - 1-2 VIEW COMPARISON:  None. FINDINGS: Right knee  replacement with intact hardware and normal alignment. Gas in the soft tissues consistent with recent surgery. IMPRESSION: Status post right knee replacement with expected postsurgical change Electronically Signed   By: Donavan Foil M.D.   On: 08/31/2021 17:14   [place] OR NERVE BLOCK-IMAGE ONLY [facility])  Result Date: 08/31/2021 There is no interpretation for this exam.  This order is for images obtained during a surgical procedure.  Please See "Surgeries" Tab for more information regarding the procedure.    Assessment/Plan: 2 Days Post-Op   Active Problems:   History of total knee arthroplasty, right   Advance diet Up with therapy Discharge today with HHPT if PT goals met   Carlynn Spry , PA-C 09/02/2021, 6:52 AM

## 2021-09-02 NOTE — Progress Notes (Signed)
Physical Therapy Treatment Patient Details Name: Jonathan Shelton MRN: [id number] DOB: 07-04-1951 Today's Date: 09/02/2021   History of Present Illness      PT Comments    Pt continues to make progress ambulation with RW requiring CGA for safety and balance. Pt completed 2 steps with B UEs on R railing only and instructed to descend stairs backwards to improve safety. Will re-attempt stairs with spouse in afternoon to improve safety with home entry/exit. Pt demonstrating improvements with static standing balance with 1 UE. Will continue to see patient during acute hospitalization to improve independence and decrease fall risk.   Recommendations for follow up therapy are one component of a multi-disciplinary discharge planning process, led by the attending physician.  Recommendations may be updated based on patient status, additional functional criteria and insurance authorization.  Follow Up Recommendations  Home health PT;Supervision/Assistance - 24 hour     Equipment Recommendations  Rolling walker with 5" wheels;3in1 (PT)    Recommendations for Other Services       Precautions / Restrictions Precautions Precautions: Knee;Fall Restrictions Weight Bearing Restrictions: Yes RLE Weight Bearing: Weight bearing as tolerated     Mobility  Bed Mobility Overal bed mobility: Needs Assistance         Sit to supine: Min assist   General bed mobility comments: MinA for R LE back into bed    Transfers Overall transfer level: Needs assistance Equipment used: Rolling walker (2 wheeled) Transfers: Sit to/from Stand Sit to Stand: Min guard         General transfer comment: CGA with usage of RW  Ambulation/Gait Ambulation/Gait assistance: Min guard Gait Distance (Feet): 75 Feet Assistive device: Rolling walker (2 wheeled) Gait Pattern/deviations: Step-through pattern;Decreased weight shift to right;Decreased dorsiflexion - right Gait velocity: decreased   General Gait  Details: Step through gait with lack of full heel strike and terminal knee extension throughout gait cycle. Cues to improve R heel strike   Stairs Stairs: Yes Stairs assistance: Min guard Stair Management: One rail Right;Forwards;Backwards;Step to pattern Number of Stairs: 2 General stair comments: Pt able to go up 2 steps with R rail only with CGA and heavy verbal cues for sequencing. Pt instructed to come down backwards for safety. Pt initially went up 1 step x 2 with B rails and progressed to 1 rail.   Wheelchair Mobility    Modified Rankin (Stroke Patients Only)       Balance Overall balance assessment: Needs assistance Sitting-balance support: No upper extremity supported;Feet supported Sitting balance-Leahy Scale: Good     Standing balance support: No upper extremity supported;During functional activity Standing balance-Leahy Scale: Fair Standing balance comment: Brief momemt of no UE on RW during ambulation to don mask prior to entering hall way. Continued to require CGA for balance                            Cognition Arousal/Alertness: Awake/alert Behavior During Therapy: WFL for tasks assessed/performed Overall Cognitive Status: Within Functional Limits for tasks assessed                                 General Comments: Additional time for answering questions      Exercises Total Joint Exercises Quad Sets: AROM;Strengthening;Right;10 reps;Supine Long Arc Quad: AROM;Strengthening;Right;20 reps;Seated Knee Flexion: AROM;Right;20 reps;Seated Goniometric ROM: 10-75 Other Exercises Other Exercises: Pt given and reviewed handout for exercises to be  performed. Pt assisted with ambulation to bathroom and reporting need for BM and urination. Pt able to stand with RW with 1 UE and manage urinal while standing over the toliet. Pt sat on toliet to have BM and was unsuccessful. Pt stood without assistance + RW for support. Pt ambulated back to bed  and minA back to bed. Pt left with all needs within reach.    General Comments        Pertinent Vitals/Pain Pain Assessment: 0-10 Pain Score: 5  Pain Location: R knee Pain Descriptors / Indicators: Sore Pain Intervention(s): Limited activity within patient's tolerance;Monitored during session;Patient requesting pain meds-RN notified;Relaxation;Ice applied    Home Living                      Prior Function            PT Goals (current goals can now be found in the care plan section) Acute Rehab PT Goals Patient Stated Goal: to go home PT Goal Formulation: With patient Time For Goal Achievement: 09/15/21 Potential to Achieve Goals: Good Progress towards PT goals: Progressing toward goals    Frequency    BID      PT Plan Current plan remains appropriate    Co-evaluation              AM-PAC PT "6 Clicks" Mobility   Outcome Measure  Help needed turning from your back to your side while in a flat bed without using bedrails?: A Little Help needed moving from lying on your back to sitting on the side of a flat bed without using bedrails?: A Little Help needed moving to and from a bed to a chair (including a wheelchair)?: A Little Help needed standing up from a chair using your arms (e.g., wheelchair or bedside chair)?: A Little Help needed to walk in hospital room?: A Little Help needed climbing 3-5 steps with a railing? : A Little 6 Click Score: 18    End of Session Equipment Utilized During Treatment: Gait belt Activity Tolerance: Patient tolerated treatment well Patient left: in bed;with call bell/phone within reach;with bed alarm set;with SCD's reapplied;Other (comment) (Ice reapplied) Nurse Communication: Mobility status PT Visit Diagnosis: Other abnormalities of gait and mobility (R26.89);Muscle weakness (generalized) (M62.81);History of falling (Z91.81);Pain Pain - Right/Left: Right Pain - part of body: Knee     Time: 0931-1009 PT Time  Calculation (min) (ACUTE ONLY): 38 min  Charges:  $Gait Training: 8-22 mins $Therapeutic Exercise: 8-22 mins $Therapeutic Activity: 8-22 mins                     Andrey Campanile, SPT    Andrey Campanile 09/02/2021, 1:37 PM

## 2021-09-02 NOTE — Discharge Instructions (Signed)
Continue weight bear as tolerated on the left lower extremity.    Elevate the left lower extremity whenever possible and continue the polar care while elevating the extremity. Patient may shower. No bath or submerging the wound.    Take aspirin as directed for blood clot prevention.  Continue to work on knee range of motion exercises at home as instructed by physical therapy. Continue to use a walker for assistance with ambulation until cleared by physical therapy.  Call [phone] with any questions, such as fever > 101.5 degrees, drainage from the wound or shortness of breath. 

## 2021-09-05 ENCOUNTER — Other Ambulatory Visit: Payer: Self-pay | Admitting: Student

## 2021-09-05 ENCOUNTER — Ambulatory Visit
Admission: RE | Admit: 2021-09-05 | Discharge: 2021-09-05 | Disposition: A | Discharge status: Home / Self Care | Payer: Medicare HMO | Source: Intra-hospital | Attending: Student | Admitting: Student

## 2021-09-05 ENCOUNTER — Ambulatory Visit
Admission: RE | Admit: 2021-09-05 | Discharge: 2021-09-05 | Disposition: A | Discharge status: Home / Self Care | Payer: Medicare HMO | Source: Ambulatory Visit | Attending: Student | Admitting: Student

## 2021-09-05 DIAGNOSIS — R2241 Localized swelling, mass and lump, right lower limb: Secondary | ICD-10-CM

## 2021-09-14 DIAGNOSIS — Z4889 Encounter for other specified surgical aftercare: Secondary | ICD-10-CM | POA: Insufficient documentation

## 2021-09-15 ENCOUNTER — Other Ambulatory Visit: Payer: Medicare HMO

## 2021-09-24 ENCOUNTER — Telehealth: Payer: Self-pay

## 2021-09-24 DIAGNOSIS — N3000 Acute cystitis without hematuria: Secondary | ICD-10-CM

## 2021-09-24 MED ORDER — CEPHALEXIN 500 MG PO CAPS: 500.0000 mg | ORAL_CAPSULE | Freq: Three times a day (TID) | ORAL | 0 refills | Status: DC

## 2021-09-24 NOTE — Telephone Encounter (Signed)
Please notify patient  Okay, limited options I am out of office afternoon and tomorrow.  I will just order Keflex antibiotic given his special circumstances today and he can take antibiotic 3 times a day for 7 days and if not improved he should schedule next week  Rx sent to [place] 09/24/2021, 11:33 AM

## 2021-09-24 NOTE — Telephone Encounter (Signed)
Left a message on the pts machine letting him know of Dr. Marthann Schiller recommendations.

## 2021-09-24 NOTE — Telephone Encounter (Signed)
Copied from [place] [phone]. Topic: General - Other >> Sep 24, 2021  9:37 AM Holley Dexter N wrote: Reason for CRM: Pt called in stating he has recent had a knee replacement and was put on some medication, and now has contracted a UTI, pt is unable to come into the office to give a sample, pt requesting if someone can give him a call back to see what he can do about getting some antibiotics. Please advise.

## 2021-09-25 DIAGNOSIS — M179 Osteoarthritis of knee, unspecified: Secondary | ICD-10-CM | POA: Insufficient documentation

## 2021-10-15 ENCOUNTER — Encounter: Payer: Self-pay | Admitting: Family Medicine

## 2021-10-28 ENCOUNTER — Other Ambulatory Visit: Payer: Self-pay | Admitting: Family Medicine

## 2021-10-28 DIAGNOSIS — E1169 Type 2 diabetes mellitus with other specified complication: Secondary | ICD-10-CM

## 2021-10-28 NOTE — Telephone Encounter (Signed)
Requested medications are due for refill today.  yes  Requested medications are on the active medications list.  yes  Last refill. 04/08/2021  Future visit scheduled.   yes  Notes to clinic.  Per note from office visit on 06/02/2021, pt was to RTC around 09/02/2021 for follow up and labs.     Requested Prescriptions  Pending Prescriptions Disp Refills   insulin NPH Human (NOVOLIN N) 100 UNIT/ML injection 40 mL 1    Sig: INJECT 40 UNITS (0.4 ML) INTO THE SKIN AT BEDTIME. MAY USE REDUCED DOSE AS ADVISED.     Endocrinology:  Diabetes - Insulins Passed - 10/28/2021  1:34 AM      Passed - HBA1C is between 0 and 7.9 and within 180 days    Hgb A1c MFr Bld  Date Value Ref Range Status  08/31/2021 7.4 (H) 4.8 - 5.6 % Final    Comment:    (NOTE)         Prediabetes: 5.7 - 6.4         Diabetes: >6.4         Glycemic control for adults with diabetes: <7.0           Passed - Valid encounter within last 6 months    Recent Outpatient Visits           3 months ago COVID-19 virus infection   [place], DO   4 months ago Type 2 diabetes mellitus with other specified complication, with long-term current use of insulin [facility])   [facility], Devonne Doughty, DO   8 months ago Type 2 diabetes mellitus with other specified complication, with long-term current use of insulin [facility])   [facility], Devonne Doughty, DO   11 months ago Type 2 diabetes mellitus with other specified complication, with long-term current use of insulin ([place])   [facility], Devonne Doughty, DO   1 year ago Annual physical exam   [facility] [name], DO       Future Appointments             In 6 months [facility], [facility]

## 2021-10-30 ENCOUNTER — Other Ambulatory Visit: Payer: Self-pay | Admitting: Family Medicine

## 2021-10-30 DIAGNOSIS — E1169 Type 2 diabetes mellitus with other specified complication: Secondary | ICD-10-CM

## 2021-10-30 DIAGNOSIS — I10 Essential (primary) hypertension: Secondary | ICD-10-CM

## 2021-10-30 DIAGNOSIS — Z794 Long term (current) use of insulin: Secondary | ICD-10-CM

## 2021-10-30 DIAGNOSIS — N401 Enlarged prostate with lower urinary tract symptoms: Secondary | ICD-10-CM

## 2021-10-30 DIAGNOSIS — E785 Hyperlipidemia, unspecified: Secondary | ICD-10-CM

## 2021-11-08 ENCOUNTER — Other Ambulatory Visit: Payer: Self-pay | Admitting: Family Medicine

## 2021-11-08 DIAGNOSIS — E1169 Type 2 diabetes mellitus with other specified complication: Secondary | ICD-10-CM

## 2021-11-10 NOTE — Telephone Encounter (Signed)
Requested Prescriptions  Pending Prescriptions Disp Refills   metFORMIN (GLUCOPHAGE) 1000 MG tablet [Pharmacy Med Name: METFORMIN HCL 1,000 MG TABLET] 180 tablet 0    Sig: TAKE 1 TABLET (1,000 MG TOTAL) BY MOUTH 2 (TWO) TIMES DAILY WITH A MEAL.     Endocrinology:  Diabetes - Biguanides Passed - 11/08/2021  9:12 AM      Passed - Cr in normal range and within 360 days    Creat  Date Value Ref Range Status  05/26/2021 0.79 0.70 - 1.35 mg/dL Final   Creatinine, Ser  Date Value Ref Range Status  09/01/2021 0.81 0.61 - 1.24 mg/dL Final         Passed - HBA1C is between 0 and 7.9 and within 180 days    Hgb A1c MFr Bld  Date Value Ref Range Status  08/31/2021 7.4 (H) 4.8 - 5.6 % Final    Comment:    (NOTE)         Prediabetes: 5.7 - 6.4         Diabetes: >6.4         Glycemic control for adults with diabetes: <7.0          Passed - eGFR in normal range and within 360 days    GFR, Est African American  Date Value Ref Range Status  08/12/2020 112 > OR = 60 mL/min/1.86m Final   GFR, Est Non African American  Date Value Ref Range Status  08/12/2020 96 > OR = 60 mL/min/1.760mFinal   GFR, Estimated  Date Value Ref Range Status  09/01/2021 >60 >60 mL/min Final    Comment:    (NOTE) Calculated using the CKD-EPI Creatinine Equation (2021)    eGFR  Date Value Ref Range Status  05/26/2021 96 > OR = 60 mL/min/1.7365minal    Comment:    The eGFR is based on the CKD-EPI 2021 equation. To calculate  the new eGFR from a previous Creatinine or Cystatin C result, go to https://www.kidney.org/professionals/ kdoqi/gfr%5Fcalculator          Passed - Valid encounter within last 6 months    Recent Outpatient Visits          3 months ago [place]19 virus infection   [place] [place]   5 months ago Type 2 diabetes mellitus with other specified complication, with long-term current use of insulin ([facility]   8 months ago Type 2 diabetes mellitus with other specified complication, with long-term current use of insulin ([facility] [place] [facility]   11 months ago Type 2 diabetes mellitus with other specified complication, with long-term current use of insulin ([facility]   1 year ago Annual physical exam   [facility]      Future Appointments            In 3 [place] [facility]  [facility]

## 2021-11-26 ENCOUNTER — Other Ambulatory Visit: Payer: Self-pay

## 2021-11-26 DIAGNOSIS — Z794 Long term (current) use of insulin: Secondary | ICD-10-CM

## 2021-11-26 DIAGNOSIS — I10 Essential (primary) hypertension: Secondary | ICD-10-CM

## 2021-11-26 DIAGNOSIS — N401 Enlarged prostate with lower urinary tract symptoms: Secondary | ICD-10-CM

## 2021-11-26 DIAGNOSIS — M25661 Stiffness of right knee, not elsewhere classified: Secondary | ICD-10-CM | POA: Insufficient documentation

## 2021-11-26 DIAGNOSIS — E1169 Type 2 diabetes mellitus with other specified complication: Secondary | ICD-10-CM

## 2021-11-26 DIAGNOSIS — M25561 Pain in right knee: Secondary | ICD-10-CM | POA: Insufficient documentation

## 2021-11-26 DIAGNOSIS — E785 Hyperlipidemia, unspecified: Secondary | ICD-10-CM

## 2021-11-27 ENCOUNTER — Other Ambulatory Visit: Payer: Self-pay

## 2021-11-27 ENCOUNTER — Telehealth: Payer: Self-pay

## 2021-11-27 ENCOUNTER — Other Ambulatory Visit: Payer: Medicare HMO

## 2021-11-27 DIAGNOSIS — E1169 Type 2 diabetes mellitus with other specified complication: Secondary | ICD-10-CM

## 2021-11-27 DIAGNOSIS — Z794 Long term (current) use of insulin: Secondary | ICD-10-CM

## 2021-11-27 MED ORDER — OZEMPIC (1 MG/DOSE) 4 MG/3ML ~~LOC~~ SOPN: 1.0000 mg | PEN_INJECTOR | SUBCUTANEOUS | 1 refills | Status: DC

## 2021-11-27 NOTE — Telephone Encounter (Signed)
Pt  requesting a refill on Ozempic  called into  [facility] [address]

## 2021-11-27 NOTE — Telephone Encounter (Signed)
Sent!

## 2021-11-28 ENCOUNTER — Encounter: Payer: Self-pay | Admitting: Family Medicine

## 2021-11-28 LAB — COMPLETE METABOLIC PANEL WITH GFR
AG Ratio: 1.6 (calc) (ref 1.0–2.5)
ALT: 15 U/L (ref 9–46)
AST: 15 U/L (ref 10–35)
Albumin: 3.9 g/dL (ref 3.6–5.1)
Alkaline phosphatase (APISO): 70 U/L (ref 35–144)
BUN: 15 mg/dL (ref 7–25)
CO2: 26 mmol/L (ref 20–32)
Calcium: 9.3 mg/dL (ref 8.6–10.3)
Chloride: 107 mmol/L (ref 98–110)
Creat: 0.75 mg/dL (ref 0.70–1.28)
Globulin: 2.5 g/dL (calc) (ref 1.9–3.7)
Glucose, Bld: 70 mg/dL (ref 65–99)
Potassium: 4.1 mmol/L (ref 3.5–5.3)
Sodium: 143 mmol/L (ref 135–146)
Total Bilirubin: 0.4 mg/dL (ref 0.2–1.2)
Total Protein: 6.4 g/dL (ref 6.1–8.1)
eGFR: 97 mL/min/{1.73_m2} (ref >=60)

## 2021-11-28 LAB — LIPID PANEL
Cholesterol: 101 mg/dL (ref <200)
HDL: 41 mg/dL (ref >=40)
LDL Cholesterol (Calc): 43 mg/dL (calc)
Non-HDL Cholesterol (Calc): 60 mg/dL (calc) (ref <130)
Total CHOL/HDL Ratio: 2.5 (calc) (ref <5.0)
Triglycerides: 88 mg/dL (ref <150)

## 2021-11-28 LAB — PSA: PSA: 0.26 ng/mL (ref <=4.00)

## 2021-11-28 LAB — CBC WITH DIFFERENTIAL/PLATELET
Absolute Monocytes: 805 cells/uL (ref 200–950)
Basophils Absolute: 53 cells/uL (ref 0–200)
Basophils Relative: 0.8 %
Eosinophils Absolute: 251 cells/uL (ref 15–500)
Eosinophils Relative: 3.8 %
HCT: 45.5 % (ref 38.5–50.0)
Hemoglobin: 15.1 g/dL (ref 13.2–17.1)
Lymphs Abs: 1815 cells/uL (ref 850–3900)
MCH: 30.6 pg (ref 27.0–33.0)
MCHC: 33.2 g/dL (ref 32.0–36.0)
MCV: 92.1 fL (ref 80.0–100.0)
MPV: 9.1 fL (ref 7.5–12.5)
Monocytes Relative: 12.2 %
Neutro Abs: 3676 cells/uL (ref 1500–7800)
Neutrophils Relative %: 55.7 %
Platelets: 258 10*3/uL (ref 140–400)
RBC: 4.94 10*6/uL (ref 4.20–5.80)
RDW: 13.1 % (ref 11.0–15.0)
Total Lymphocyte: 27.5 %
WBC: 6.6 10*3/uL (ref 3.8–10.8)

## 2021-11-28 LAB — HEMOGLOBIN A1C
Hgb A1c MFr Bld: 7 % of total Hgb — ABNORMAL HIGH (ref <5.7)
Mean Plasma Glucose: 154 mg/dL
eAG (mmol/L): 8.5 mmol/L

## 2021-12-03 ENCOUNTER — Other Ambulatory Visit: Payer: Self-pay

## 2021-12-03 ENCOUNTER — Encounter: Payer: Self-pay | Admitting: Family Medicine

## 2021-12-03 ENCOUNTER — Ambulatory Visit (INDEPENDENT_AMBULATORY_CARE_PROVIDER_SITE_OTHER): Payer: Medicare HMO | Admitting: Family Medicine

## 2021-12-03 VITALS — BP 117/59 | HR 81 | Ht 69.5 in | Wt 235.8 lb

## 2021-12-03 DIAGNOSIS — G1221 Amyotrophic lateral sclerosis: Secondary | ICD-10-CM

## 2021-12-03 DIAGNOSIS — F482 Pseudobulbar affect: Secondary | ICD-10-CM

## 2021-12-03 NOTE — Patient Instructions (Addendum)
Thank you for coming to the office today.  We'll do some research into Neurology for second opinion.  You passed memory quiz.  [facility]   Address: [address] [place], [place] [address] Hours: 8AM-5PM Phone: [phone]  [facility] [address] [place], [place] [address] Phone: [phone]  Please schedule a Follow-up Appointment to: Return in about 3 months (around 03/03/2022) for 3 month follow-up DM A1c, Neuro updates.  If you have any other questions or concerns, please feel free to call the office or send a message through [place]. You may also schedule an earlier appointment if necessary.  Additionally, you may be receiving a survey about your experience at our office within a few days to 1 week by e-mail or mail. We value your feedback.  Nobie Putnam, DO [place]

## 2021-12-03 NOTE — Progress Notes (Addendum)
Subjective:    Patient ID: Jonathan Shelton, male    DOB: 07-Jun-1951, 71 y.o.   MRN: [id number]  Jonathan Shelton is a 71 y.o. male presenting on 12/03/2021 for Diabetes   HPI  ALS Pseudobulbar Affect Neuromuscular dysfunction / Cognitive vs Memory loss Neuro [facility] > prior diagnosis with Parkinsons like condition, then referred to [place] [facility] with follow up recently. Diagnosis has caused significant concern for patient and family. They offered rx management and research clinical trial and patient is unsure at this time how to proceed. 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 He still has issues with fall and balance problem. Now on Praluent with great results on Lipid panel.   CHRONIC HTN: Reports controlled BP Current Meds - Valsartan 874mdaily   Reports good compliance, took meds today. Tolerating well, w/o complaints. Denies CP, dyspnea, HA, edema, dizziness / lightheadedness   CHRONIC DM, Type 2: A1c last result controlled. CBGs: variable ranges, AM sugar lower in 100-150 range. In past he had results < 100 at times Meds: Novolin-N 40 units nightly, Ozempic 50m24meekly, Jardiance 51m60mily, Metformin 1000mg36m Reports good compliance. Tolerating well w/o side-effects Now he says Ozempic no longer curbing appetite wants to change to  Trulicity Currently on ARB Denies hypoglycemia, polyuria, visual changes, numbness or tingling   Depression screen PH[facility] 04/21/2021 03/18/2020  Decreased Interest 2 3 0  Down, Depressed, Hopeless 0 3 0  PHQ - 2 Score 2 6 0  Altered sleeping 2 1 -  Tired, decreased energy 3 3 -  Change in appetite 1 0 -  Feeling bad or failure about yourself  0 3 -  Trouble concentrating 3 0 -  Moving slowly or fidgety/restless 0 3 -  Suicidal thoughts 0 0 -  PHQ-9 Score 11 16 -  Difficult doing work/chores Not difficult at all Very difficult -    Social History   Tobacco Use   Smoking status: Former    Types: Cigarettes    Quit date: 1970    Years since quitting: 53.0   Smokeless tobacco: Former    Types: Snuff  Vaping Use   Vaping Use: Never used  Substance Use Topics   Alcohol use: Not Currently   Drug use: Not Currently    Comment: past    Review of Systems Per HPI unless specifically indicated above     Objective:    BP (!) 117/59    Pulse 81    Ht 5' 9.5" (1.765 m)    Wt 235 lb 12.8 oz (107 kg)    SpO2 96%    BMI 34.32 kg/m   Wt Readings from Last 3 Encounters:  12/03/21 235 lb 12.8 oz (107 kg)  08/31/21 230 lb (104.3 kg)  08/24/21 232 lb (105.2 kg)    Physical Exam Vitals and nursing note reviewed.  Constitutional:  General: He is not in acute distress.    Appearance: He is well-developed. He is not diaphoretic.     Comments: Well-appearing, comfortable, cooperative  HENT:     Head: Normocephalic and atraumatic.  Eyes:     General:        Right eye: No discharge.        Left eye: No discharge.     Conjunctiva/sclera: Conjunctivae normal.  Neck:     Thyroid: No thyromegaly.  Cardiovascular:     Rate and Rhythm: Normal rate and regular rhythm.     Pulses: Normal pulses.     Heart sounds: Normal heart sounds. No murmur heard. Pulmonary:     Effort: Pulmonary effort is normal. No respiratory distress.     Breath sounds: Normal breath sounds. No  wheezing or rales.  Musculoskeletal:     Cervical back: Normal range of motion and neck supple.     Comments: Muscle stiffness and some reduced ROM  Lymphadenopathy:     Cervical: No cervical adenopathy.  Skin:    General: Skin is warm and dry.     Findings: No erythema or rash.  Neurological:     Mental Status: He is alert and oriented to person, place, and time. Mental status is at baseline.  Psychiatric:        Behavior: Behavior normal.     Comments: Well groomed, good eye contact, stable now with slowed speech, occasional word finding issue.    6CIT Screen 12/03/2021 04/21/2021  What Year? 0 points 0 points  What month? 0 points 0 points  What time? 0 points 0 points  Count back from 20 0 points 0 points  Months in reverse 0 points 0 points  Repeat phrase 0 points 0 points  Total Score 0 0     Results for orders placed or performed in visit on 11/26/21  PSA  Result Value Ref Range   PSA 0.26 < OR = 4.00 ng/mL  Hemoglobin A1c  Result Value Ref Range   Hgb A1c MFr Bld 7.0 (H) <5.7 % of total Hgb   Mean Plasma Glucose 154 mg/dL   eAG (mmol/L) 8.5 mmol/L  CBC with Differential/Platelet  Result Value Ref Range   WBC 6.6 3.8 - 10.8 Thousand/uL   RBC 4.94 4.20 - 5.80 Million/uL   Hemoglobin 15.1 13.2 - 17.1 g/dL   HCT 45.5 38.5 - 50.0 %   MCV 92.1 80.0 - 100.0 fL   MCH 30.6 27.0 - 33.0 pg   MCHC 33.2 32.0 - 36.0 g/dL   RDW 13.1 11.0 - 15.0 %   Platelets 258 140 - 400 Thousand/uL   MPV 9.1 7.5 - 12.5 fL   Neutro Abs 3,676 1,500 - 7,800 cells/uL   Lymphs Abs 1,815 850 - 3,900 cells/uL   Absolute Monocytes 805 200 - 950 cells/uL   Eosinophils Absolute 251 15 - 500 cells/uL   Basophils Absolute 53 0 - 200 cells/uL   Neutrophils Relative % 55.7 %   Total Lymphocyte 27.5 %   Monocytes Relative 12.2 %   Eosinophils Relative 3.8 %   Basophils Relative 0.8 %  Lipid panel  Result Value Ref Range   Cholesterol 101 <200 mg/dL   HDL 41 > OR = 40 mg/dL   Triglycerides 88  <150 mg/dL   LDL Cholesterol (Calc) 43 mg/dL (calc)   Total CHOL/HDL Ratio 2.5 <5.0 (calc)   Non-HDL Cholesterol (Calc) 60 <130 mg/dL (calc)  COMPLETE METABOLIC PANEL WITH GFR  Result  Value Ref Range   Glucose, Bld 70 65 - 99 mg/dL   BUN 15 7 - 25 mg/dL   Creat 0.75 0.70 - 1.28 mg/dL   eGFR 97 > OR = 60 mL/min/1.32m   BUN/Creatinine Ratio NOT APPLICABLE 6 - 22 (calc)   Sodium 143 135 - 146 mmol/L   Potassium 4.1 3.5 - 5.3 mmol/L   Chloride 107 98 - 110 mmol/L   CO2 26 20 - 32 mmol/L   Calcium 9.3 8.6 - 10.3 mg/dL   Total Protein 6.4 6.1 - 8.1 g/dL   Albumin 3.9 3.6 - 5.1 g/dL   Globulin 2.5 1.9 - 3.7 g/dL (calc)   AG Ratio 1.6 1.0 - 2.5 (calc)   Total Bilirubin 0.4 0.2 - 1.2 mg/dL   Alkaline phosphatase (APISO) 70 35 - 144 U/L   AST 15 10 - 35 U/L   ALT 15 9 - 46 U/L      Assessment & Plan:   Problem List Items Addressed This Visit   None Visit Diagnoses     ALS (amyotrophic lateral sclerosis) (HCC)    -  Primary   Pseudobulbar affect           Discussion on recent diagnosis ALS / PBA [facility] Receiving PT Has not initiated treatment yet. Patient and family are requesting a 2nd opinion on ALS diagnosis and treatment plan first. [facility] for curbside advice first on how to proceed and if can do 2nd opinion referral.  Note today 6CIT results repeated negative normal range, only mild recall detail missed.  *Addendum Update 12/07/21* Since our last visit, [facility] and received advice that they do not have anyone who specializes in ALS or managing it. Their best advice after a thorough chart review was that they are concerned about the diagnosis of ALS still and do think it warrants further management and specialized care. [facility] however if the desire is for 2nd opinion, [facility](Dr JClair GullingCaress was one recommendation there or other providers available) and they could further assess RCarloyn Manner I called and spoke with wife NIzora Galaand she appreciated the response and will discuss with family and let me know decision on if [facility] or [facility].  No orders of the defined types were placed in this encounter.    Follow up plan: Return in about 3 months (around 03/03/2022) for 3 month follow-up DM A1c, Neuro updates.   ANobie Putnam [facility] 12/03/2021, 10:09 AM

## 2021-12-18 ENCOUNTER — Encounter: Payer: Self-pay | Admitting: Family Medicine

## 2021-12-18 DIAGNOSIS — G1221 Amyotrophic lateral sclerosis: Secondary | ICD-10-CM

## 2021-12-23 ENCOUNTER — Other Ambulatory Visit: Payer: Self-pay | Admitting: Family Medicine

## 2021-12-23 DIAGNOSIS — R3914 Feeling of incomplete bladder emptying: Secondary | ICD-10-CM

## 2021-12-23 DIAGNOSIS — I10 Essential (primary) hypertension: Secondary | ICD-10-CM

## 2021-12-23 DIAGNOSIS — N401 Enlarged prostate with lower urinary tract symptoms: Secondary | ICD-10-CM

## 2021-12-23 NOTE — Telephone Encounter (Signed)
Requested Prescriptions  Pending Prescriptions Disp Refills   finasteride (PROSCAR) 5 MG tablet [Pharmacy Med Name: FINASTERIDE 5 MG TABLET] 90 tablet 1    Sig: TAKE 1 TABLET (5 MG TOTAL) BY MOUTH DAILY.     Urology: 5-alpha Reductase Inhibitors Passed - 12/23/2021  3:05 PM      Passed - PSA in normal range and within 360 days    PSA  Date Value Ref Range Status  11/27/2021 0.26 < OR = 4.00 ng/mL Final    Comment:    The total PSA value from this assay system is  standardized against the WHO standard. The test  result will be approximately 20% lower when compared  to the equimolar-standardized total PSA (Beckman  Coulter). Comparison of serial PSA results should be  interpreted with this fact in mind. . This test was performed using the Siemens  chemiluminescent method. Values obtained from  different assay methods cannot be used interchangeably. PSA levels, regardless of value, should not be interpreted as absolute evidence of the presence or absence of disease.          Passed - Valid encounter within last 12 months    Recent Outpatient Visits          2 weeks ago ALS (amyotrophic lateral sclerosis) ([place])   [place], DO   5 months ago COVID-19 virus infection   [place], DO   6 months ago Type 2 diabetes mellitus with other specified complication, with long-term current use of insulin [facility])   [facility], Devonne Doughty, DO   10 months ago Type 2 diabetes mellitus with other specified complication, with long-term current use of insulin [facility])   [facility] [name], DO   1 year ago Type 2 diabetes mellitus with other specified complication, with long-term current use of insulin ([place])   [facility] [place], Devonne Doughty, DO      Future Appointments            In 4 months [facility], [facility]

## 2021-12-23 NOTE — Telephone Encounter (Signed)
Requested Prescriptions  Pending Prescriptions Disp Refills   valsartan (DIOVAN) 80 MG tablet [Pharmacy Med Name: VALSARTAN 80 MG TABLET] 90 tablet 0    Sig: TAKE 1 TABLET BY MOUTH EVERY DAY     Cardiovascular:  Angiotensin Receptor Blockers Passed - 12/23/2021  1:36 AM      Passed - Cr in normal range and within 180 days    Creat  Date Value Ref Range Status  11/27/2021 0.75 0.70 - 1.28 mg/dL Final         Passed - K in normal range and within 180 days    Potassium  Date Value Ref Range Status  11/27/2021 4.1 3.5 - 5.3 mmol/L Final         Passed - Patient is not pregnant      Passed - Last BP in normal range    BP Readings from Last 1 Encounters:  12/03/21 (!) 117/59         Passed - Valid encounter within last 6 months    Recent Outpatient Visits          2 weeks ago ALS (amyotrophic lateral sclerosis) ([place])   [facility] [name], DO   5 months ago COVID-19 virus infection   [place], DO   6 months ago Type 2 diabetes mellitus with other specified complication, with long-term current use of insulin [facility])   [facility], Devonne Doughty, DO   10 months ago Type 2 diabetes mellitus with other specified complication, with long-term current use of insulin [facility])   [facility] [place], Devonne Doughty, DO   1 year ago Type 2 diabetes mellitus with other specified complication, with long-term current use of insulin ([place])   [facility] [place], Devonne Doughty, DO      Future Appointments            In 4 months [facility], [facility]

## 2022-02-05 ENCOUNTER — Other Ambulatory Visit: Payer: Self-pay | Admitting: Family Medicine

## 2022-02-05 DIAGNOSIS — I10 Essential (primary) hypertension: Secondary | ICD-10-CM

## 2022-02-05 DIAGNOSIS — E1169 Type 2 diabetes mellitus with other specified complication: Secondary | ICD-10-CM

## 2022-02-08 NOTE — Telephone Encounter (Signed)
Requested Prescriptions  ?Pending Prescriptions Disp Refills  ?? valsartan (DIOVAN) 80 MG tablet [Pharmacy Med Name: VALSARTAN 80 MG TABLET] 90 tablet 0  ?  Sig: TAKE 1 TABLET BY MOUTH EVERY DAY  ?  ? Cardiovascular:  Angiotensin Receptor Blockers Passed - 02/05/2022  5:20 PM  ?  ?  Passed - Cr in normal range and within 180 days  ?  Creat  ?Date Value Ref Range Status  ?11/27/2021 0.75 0.70 - 1.28 mg/dL Final  ?   ?  ?  Passed - K in normal range and within 180 days  ?  Potassium  ?Date Value Ref Range Status  ?11/27/2021 4.1 3.5 - 5.3 mmol/L Final  ?   ?  ?  Passed - Patient is not pregnant  ?  ?  Passed - Last BP in normal range  ?  BP Readings from Last 1 Encounters:  ?12/03/21 (!) 117/59  ?   ?  ?  Passed - Valid encounter within last 6 months  ?  Recent Outpatient Visits   ?      ? 2 months ago ALS (amyotrophic lateral sclerosis) ([place])  ? [place], DO  ? 6 months ago COVID-19 virus infection  ? [place], DO  ? 8 months ago Type 2 diabetes mellitus with other specified complication, with long-term current use of insulin ([place])  ? [place], DO  ? 11 months ago Type 2 diabetes mellitus with other specified complication, with long-term current use of insulin ([place])  ? [place], DO  ? 1 year ago Type 2 diabetes mellitus with other specified complication, with long-term current use of insulin ([place])  ? [place], DO  ?  ?  ?Future Appointments   ?        ? In 2 months [place]   ?  ? ?  ?  ?  ?? metFORMIN (GLUCOPHAGE) 1000 MG tablet [Pharmacy Med Name: METFORMIN HCL 1,000 MG TABLET] 180 tablet 0  ?  Sig: TAKE 1 TABLET (1,000 MG TOTAL) BY MOUTH TWICE A DAY WITH FOOD  ?  ? Endocrinology:  Diabetes - Biguanides Failed - 02/05/2022  5:20 PM  ?  ?  Failed - B12 Level in normal range and  within 720 days  ?  No results found for: VITAMINB12   ?  ?  Passed - Cr in normal range and within 360 days  ?  Creat  ?Date Value Ref Range Status  ?11/27/2021 0.75 0.70 - 1.28 mg/dL Final  ?   ?  ?  Passed - HBA1C is between 0 and 7.9 and within 180 days  ?  Hgb A1c MFr Bld  ?Date Value Ref Range Status  ?11/27/2021 7.0 (H) <5.7 % of total Hgb Final  ?  Comment:  ?  For someone without known diabetes, a hemoglobin A1c ?value of 6.5% or greater indicates that they may have  ?diabetes and this should be confirmed with a follow-up  ?test. ?. ?For someone with known diabetes, a value <7% indicates  ?that their diabetes is well controlled and a value  ?greater than or equal to 7% indicates suboptimal  ?control. A1c targets should be individualized based on  ?duration of diabetes, age, comorbid conditions, and  ?other considerations. ?. ?Currently, no consensus exists regarding use of ?hemoglobin A1c for  diagnosis of diabetes for children. ?. ?  ?   ?  ?  Passed - eGFR in normal range and within 360 days  ?  GFR, Est African American  ?Date Value Ref Range Status  ?08/12/2020 112 > OR = 60 mL/min/1.79m Final  ? ?GFR, Est Non African American  ?Date Value Ref Range Status  ?08/12/2020 96 > OR = 60 mL/min/1.771mFinal  ? ?GFR, Estimated  ?Date Value Ref Range Status  ?09/01/2021 >60 >60 mL/min Final  ?  Comment:  ?  (NOTE) ?Calculated using the CKD-EPI Creatinine Equation (2021) ?  ? ?eGFR  ?Date Value Ref Range Status  ?11/27/2021 97 > OR = 60 mL/min/1.7314minal  ?  Comment:  ?  The eGFR is based on the CKD-EPI 2021 equation. To calculate  ?the new eGFR from a previous Creatinine or Cystatin C ?result, go to https://www.kidney.org/professionals/ ?kdoqi/gfr%5Fcalculator ?  ?   ?  ?  Passed - Valid encounter within last 6 months  ?  Recent Outpatient Visits   ?      ? 2 months ago [place] (amyotrophic lateral sclerosis) ([place]? [place]  ? 6 months ago COVID-19 virus  infection  ? [place]  ? 8 months ago Type 2 diabetes mellitus with other specified complication, with long-term current use of insulin ([place]? [place]  ? 11 months ago Type 2 diabetes mellitus with other specified complication, with long-term current use of insulin ([place]CC[place]? [place]  ? 1 year ago Type 2 diabetes mellitus with other specified complication, with long-term current use of insulin ([place]? [place]  ?  ?  ?Future Appointments   ?        ? In 2 months [place]?  ? ?  ?  ?  Passed - CBC within normal limits and completed in the last 12 months  ?  WBC  ?Date Value Ref Range Status  ?11/27/2021 6.6 3.8 - 10.8 Thousand/uL Final  ? ?RBC  ?Date Value Ref Range Status  ?11/27/2021 4.94 4.20 - 5.80 Million/uL Final  ? ?Hemoglobin  ?Date Value Ref Range Status  ?11/27/2021 15.1 13.2 - 17.1 g/dL Final  ? ?HCT  ?Date Value Ref Range Status  ?11/27/2021 45.5 38.5 - 50.0 % Final  ? ?MCHC  ?Date Value Ref Range Status  ?11/27/2021 33.2 32.0 - 36.0 g/dL Final  ? ?MCH  ?Date Value Ref Range Status  ?11/27/2021 30.6 27.0 - 33.0 pg Final  ? ?MCV  ?Date Value Ref Range Status  ?11/27/2021 92.1 80.0 - 100.0 fL Final  ? ?No results found for: PLTCOUNTKUC, LABPLAT, POCSpring Valley VillageDW  ?Date Value Ref Range Status  ?11/27/2021 13.1 11.0 - 15.0 % Final  ? ?  ?  ?  ? ?

## 2022-02-16 ENCOUNTER — Other Ambulatory Visit: Payer: Self-pay | Admitting: Family Medicine

## 2022-02-16 DIAGNOSIS — E538 Deficiency of other specified B group vitamins: Secondary | ICD-10-CM

## 2022-02-16 DIAGNOSIS — E1169 Type 2 diabetes mellitus with other specified complication: Secondary | ICD-10-CM

## 2022-02-16 MED ORDER — METFORMIN HCL 1000 MG PO TABS: 1000.0000 mg | ORAL_TABLET | Freq: Two times a day (BID) | ORAL | 3 refills | Status: DC

## 2022-02-16 MED ORDER — CYANOCOBALAMIN 1000 MCG/ML IJ SOLN: 1000.0000 ug | INTRAMUSCULAR | 3 refills | Status: DC

## 2022-02-16 MED ORDER — VITAMIN B-12 1000 MCG PO TABS: 1000.0000 ug | ORAL_TABLET | ORAL | 3 refills | Status: DC

## 2022-03-04 ENCOUNTER — Encounter: Payer: Self-pay | Admitting: Dermatology

## 2022-03-04 ENCOUNTER — Ambulatory Visit: Payer: Medicare HMO | Admitting: Dermatology

## 2022-03-04 DIAGNOSIS — D229 Melanocytic nevi, unspecified: Secondary | ICD-10-CM

## 2022-03-04 DIAGNOSIS — Z85828 Personal history of other malignant neoplasm of skin: Secondary | ICD-10-CM | POA: Diagnosis not present

## 2022-03-04 DIAGNOSIS — L820 Inflamed seborrheic keratosis: Secondary | ICD-10-CM

## 2022-03-04 DIAGNOSIS — Z1283 Encounter for screening for malignant neoplasm of skin: Secondary | ICD-10-CM

## 2022-03-04 DIAGNOSIS — L578 Other skin changes due to chronic exposure to nonionizing radiation: Secondary | ICD-10-CM | POA: Diagnosis not present

## 2022-03-04 DIAGNOSIS — D367 Benign neoplasm of other specified sites: Secondary | ICD-10-CM

## 2022-03-04 DIAGNOSIS — L821 Other seborrheic keratosis: Secondary | ICD-10-CM

## 2022-03-04 DIAGNOSIS — D1800 Hemangioma unspecified site: Secondary | ICD-10-CM

## 2022-03-04 DIAGNOSIS — L814 Other melanin hyperpigmentation: Secondary | ICD-10-CM

## 2022-03-04 DIAGNOSIS — L570 Actinic keratosis: Secondary | ICD-10-CM | POA: Diagnosis not present

## 2022-03-04 DIAGNOSIS — D369 Benign neoplasm, unspecified site: Secondary | ICD-10-CM

## 2022-03-04 MED ORDER — PIMECROLIMUS 1 % EX CREA: TOPICAL_CREAM | CUTANEOUS | 2 refills | Status: DC

## 2022-03-04 NOTE — Patient Instructions (Addendum)
Cryotherapy Aftercare ? ?Wash gently with soap and water everyday.   ?Apply Vaseline and Band-Aid daily until healed.  ? ?Prior to procedure, discussed risks of blister formation, small wound, skin dyspigmentation, or rare scar following cryotherapy. Recommend Vaseline ointment to treated areas while healing. ? ? ?Recommend daily broad spectrum sunscreen SPF 30+ to sun-exposed areas, reapply every 2 hours as needed. Call for new or changing lesions.  ?Staying in the shade or wearing long sleeves, sun glasses (UVA+UVB protection) and wide brim hats (4-inch brim around the entire circumference of the hat) are also recommended for sun protection.  ? ? Start Elidel (Pimecrolimus) cream twice daily as needed for itching.  ? ?Melanoma ABCDEs ? ?Melanoma is the most dangerous type of skin cancer, and is the leading cause of death from skin disease.  You are more likely to develop melanoma if you: ?Have light-colored skin, light-colored eyes, or red or blond hair ?Spend a lot of time in the sun ?Tan regularly, either outdoors or in a tanning bed ?Have had blistering sunburns, especially during childhood ?Have a close family member who has had a melanoma ?Have atypical moles or large birthmarks ? ?Early detection of melanoma is key since treatment is typically straightforward and cure rates are extremely high if we catch it early.  ? ?The first sign of melanoma is often a change in a mole or a new dark spot.  The ABCDE system is a way of remembering the signs of melanoma. ? ?A for asymmetry:  The two halves do not match. ?B for border:  The edges of the growth are irregular. ?C for color:  A mixture of colors are present instead of an even brown color. ?D for diameter:  Melanomas are usually (but not always) greater than 14m - the size of a pencil eraser. ?E for evolution:  The spot keeps changing in size, shape, and color. ? ?Please check your skin once per month between visits. You can use a small mirror in front and a  large mirror behind you to keep an eye on the back side or your body.  ? ?If you see any new or changing lesions before your next follow-up, please call to schedule a visit. ? ?Please continue daily skin protection including broad spectrum sunscreen SPF 30+ to sun-exposed areas, reapplying every 2 hours as needed when you're outdoors.  ? ?Staying in the shade or wearing long sleeves, sun glasses (UVA+UVB protection) and wide brim hats (4-inch brim around the entire circumference of the hat) are also recommended for sun protection.   ? ?If You Need Anything After Your Visit ? ?If you have any questions or concerns for your doctor, please call our main line at [phone] press option 4 to reach your doctor's medical assistant. If no one answers, please leave a voicemail as directed and we will return your call as soon as possible. Messages left after 4 pm will be answered the following business day.  ? ?You may also send [place] message via MyChart. We typically respond to MyChart messages within 1-2 business days. ? ?For prescription refills, please ask your pharmacy to contact our office. Our fax number is [phone] ? ?If you have an urgent issue when the clinic is closed that cannot wait until the next business day, you can page your doctor at the number below.   ? ?Please note that while we do our best to be available for urgent issues outside of office hours, we are not available  24/7.  ? ?If you have an urgent issue and are unable to reach [place], you may choose to seek medical care at your doctor's office, retail clinic, urgent care center, or emergency room. ? ?If you have a medical emergency, please immediately call 911 or go to the emergency department. ? ?Pager Numbers ? ?- Dr. Nehemiah Massed: [phone] ? ?- Dr. Laurence Ferrari: [phone] ? ?- Dr. Nicole Kindred: [phone] ? ?In the event of inclement weather, please call our main line at [phone] for an update on the status of any delays or  closures. ? ?Dermatology Medication Tips: ?Please keep the boxes that topical medications come in in order to help keep track of the instructions about where and how to use these. Pharmacies typically print the medication instructions only on the boxes and not directly on the medication tubes.  ? ?If your medication is too expensive, please contact our office at [phone] option 4 or send [place] a message through [place].  ? ?We are unable to tell what your co-pay for medications will be in advance as this is different depending on your insurance coverage. However, we may be able to find a substitute medication at lower cost or fill out paperwork to get insurance to cover a needed medication.  ? ?If a prior authorization is required to get your medication covered by your insurance company, please allow [place] 1-2 business days to complete this process. ? ?Drug prices often vary depending on where the prescription is filled and some pharmacies may offer cheaper prices. ? ?The website www.goodrx.com contains coupons for medications through different pharmacies. The prices here do not account for what the cost may be with help from insurance (it may be cheaper with your insurance), but the website can give you the price if you did not use any insurance.  ?- You can print the associated coupon and take it with your prescription to the pharmacy.  ?- You may also stop by our office during regular business hours and pick up a GoodRx coupon card.  ?- If you need your prescription sent electronically to a different pharmacy, notify our office through [facility] or by phone at [phone] option 4. ? ? ? ? ?Si Usted Necesita Algo Despu?s de Su Visita ? ?Tambi?n puede enviarnos un mensaje a trav?s de MyChart. Por lo general respondemos a los mensajes de MyChart en el transcurso de 1 a 2 d?as h?biles. ? ?Para renovar recetas, por favor pida a su farmacia que se ponga en contacto con nuestra oficina. Nuestro n?mero de fax  es el [phone]. ? ?Si tiene un asunto urgente cuando la cl?nica est? cerrada y que no puede esperar hasta el siguiente d?a h?bil, puede llamar/localizar a su doctor(a) al n?mero que aparece a continuaci?n.  ? ?Por favor, tenga en cuenta que aunque hacemos todo lo posible para estar disponibles para asuntos urgentes fuera del horario de oficina, no estamos disponibles las 24 horas del d?a, los 7 d?as de la semana.  ? ?Si tiene un problema urgente y no puede comunicarse con nosotros, puede optar por buscar atenci?n m?dica  en el consultorio de su doctor(a), en una cl?nica privada, en un centro de atenci?n urgente o en una sala de emergencias. ? ?Si tiene Engineer, maintenance (IT) m?dica, por favor llame inmediatamente al 911 o vaya a la sala de emergencias. ? ?N?meros de b?per ? ?- Dr. Nehemiah Massed: [phone] ? ?- Dra. Moye: [phone] ? ?- Dra. Nicole Kindred: [phone] ? ?En caso de inclemencias del tiempo, por favor llame a  nuestra l?nea principal al [phone] para una actualizaci?n sobre el estado de cualquier retraso o cierre. ? ?Consejos para la medicaci?n en dermatolog?a: ?Por favor, guarde las cajas en las que vienen los medicamentos de uso t?pico para ayudarle a seguir las instrucciones sobre d?nde y c?mo usarlos. Las farmacias generalmente imprimen las instrucciones del medicamento s?lo en las cajas y no directamente en los tubos del [place].  ? ?Si su medicamento es muy caro, por favor, p?ngase en contacto con Zigmund Daniel llamando al [phone] y presione la opci?n 4 o env?enos un mensaje a trav?s de MyChart.  ? ?No podemos decirle cu?l ser? su copago por los medicamentos por adelantado ya que esto es diferente dependiendo de la cobertura de su seguro. Sin embargo, es posible que podamos encontrar un medicamento sustituto a Electrical engineer un formulario para que el seguro cubra el medicamento que se considera necesario.  ? ?Si se requiere Ardelia Mems autorizaci?n previa para que su compa??a de seguros [place]  su medicamento, por favor perm?tanos de 1 a 2 d?as h?biles para completar este proceso. ? ?Los precios de los medicamentos var?an con frecuencia dependiendo del Environmental consultant de d?nde se surte la receta y Environmental health practitioner pueden ofrec

## 2022-03-04 NOTE — Progress Notes (Signed)
? ?Follow-Up Visit ?  ?Subjective  ?Jonathan Shelton is a 71 y.o. male who presents for the following: Annual Exam (Skin cancer screening. Full body. Hx of skin cancer on face. BCC, required surgery.  C/O growths on back. 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 ?The patient presents for Total-Body Skin Exam (TBSE) for skin cancer screening and mole check.  The patient has spots, moles and lesions to be evaluated, some may be new or changing and the patient has concerns that these could be cancer. ? ?Wife with patient, contributing to history.  ? ?The following portions of the chart were reviewed this encounter and updated as appropriate:  Tobacco  Allergies  Meds  Problems  Med Hx  Surg Hx  Fam Hx   ?  ? ?Review of Systems: No other skin or systemic complaints except as noted in HPI or Assessment and Plan. ? ?Objective  ?Well appearing patient in no apparent distress; mood and affect are within normal limits. ? ?A full examination was performed including scalp, head, eyes, ears, nose, lips, neck, chest, axillae, abdomen, back, buttocks, bilateral upper extremities, bilateral lower extremities, hands, feet, fingers, toes, fingernails, and toenails. All findings within normal limits unless otherwise noted below. ? ?Left Ear x1, right temple x1 (2) ?Erythematous thin papules/macules with gritty scale.  ? ?left tricep x2, left forearm x1, left upper back x3, right lateral lower leg x3, left lateral lower leg x2 (11) ?Erythematous keratotic or waxy stuck-on papule or plaque. ? ?Pubic ?Purple/violaceous papules at scrotum ? ? ?Assessment & Plan  ? ?Lentigines ?- Scattered tan macules ?- Due to sun exposure ?- Benign-appearing, observe ?- Recommend daily broad spectrum sunscreen SPF 30+ to sun-exposed areas, reapply every 2 hours as needed. ?- Call for any changes ? ?Seborrheic Keratoses ?- Stuck-on, waxy, tan-brown papules and/or plaques  ?- Benign-appearing ?- Discussed benign etiology and prognosis. ?- Observe ?- Call for any  changes ? ?Melanocytic Nevi ?- Tan-brown and/or pink-flesh-colored symmetric macules and papules ?- Benign appearing on exam today ?- Observation ?- Call clinic for new or changing moles ?- Recommend daily use of broad spectrum spf 30+ sunscreen to sun-exposed areas.  ? ?Hemangiomas ?- Red papules ?- Discussed benign nature ?- Observe ?- Call for any changes ? ?Actinic Damage ?- Chronic condition, secondary to cumulative UV/sun exposure ?- diffuse scaly erythematous macules with underlying dyspigmentation ?- Recommend daily broad spectrum sunscreen SPF 30+ to sun-exposed areas, reapply every 2 hours as needed.  ?- Staying in the shade or wearing long sleeves, sun glasses (UVA+UVB protection) and wide brim hats (4-inch brim around the entire circumference of the hat) are also recommended for sun protection.  ?- Call for new or changing lesions. ? ?History of Basal Cell Carcinoma of the Skin ?- No evidence of recurrence today ?- Recommend regular full body skin exams ?- Recommend daily broad spectrum sunscreen SPF 30+ to sun-exposed areas, reapply every 2 hours as needed.  ?- Call if any new or changing lesions are noted between office visits  ? ?Skin cancer screening performed today. ? ?AK (actinic keratosis) (2) ?Left Ear x1, right temple x1 ?Actinic keratoses are precancerous spots that appear secondary to cumulative UV radiation exposure/sun exposure over time. They are chronic with expected duration over 1 year. A portion of actinic keratoses will progress to squamous cell carcinoma of the skin. It is not possible to reliably predict which spots will progress to skin cancer and so treatment is recommended to prevent development of skin cancer. ? ?Recommend daily  broad spectrum sunscreen SPF 30+ to sun-exposed areas, reapply every 2 hours as needed.  ?Recommend staying in the shade or wearing long sleeves, sun glasses (UVA+UVB protection) and wide brim hats (4-inch brim around the entire circumference of the  hat). ?Call for new or changing lesions. ? ?Destruction of lesion - Left Ear x1, right temple x1 ?Complexity: simple   ?Destruction method: cryotherapy   ?Informed consent: discussed and consent obtained   ?Timeout:  patient name, date of birth, surgical site, and procedure verified ?Lesion destroyed using liquid nitrogen: Yes   ?Region frozen until ice ball extended beyond lesion: Yes   ?Outcome: patient tolerated procedure well with no complications   ?Post-procedure details: wound care instructions given   ? ?Inflamed seborrheic keratosis (11) ?left tricep x2, left forearm x1, left upper back x3, right lateral lower leg x3, left lateral lower leg x2 ?Destruction of lesion - left tricep x2, left forearm x1, left upper back x3, right lateral lower leg x3, left lateral lower leg x2 ?Complexity: simple   ?Destruction method: cryotherapy   ?Informed consent: discussed and consent obtained   ?Timeout:  patient name, date of birth, surgical site, and procedure verified ?Lesion destroyed using liquid nitrogen: Yes   ?Region frozen until ice ball extended beyond lesion: Yes   ?Outcome: patient tolerated procedure well with no complications   ?Post-procedure details: wound care instructions given   ? 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  Typically we recommend 1-3 treatment sessions about 5-8 weeks apart for best results.  The patient's condition may require "maintenance treatments" in the future.  The fee for BBL / laser treatments is $350 per treatment session for the whole face.  A fee can be quoted for other parts of the body. ?Insurance typically does not pay for BBL/laser treatments and therefore the fee is an out-of-pocket cost. ? ?Start Elidel cream twice daily as needed for itching. Patient has Rx cream at home he uses and would like a new prescription for this. He will call back with the name of the medication.  ?Chronic and persistent condition with duration or  expected duration over one year. Condition is symptomatic / bothersome to patient. Not to goal. ? ?pimecrolimus (ELIDEL) 1 % cream - Pubic ?Apply twice daily to affected groin areas as needed for itching. ? ?Return in about 1 year (around 03/05/2023) for TBSE, HxBCC. ? ?I, Emelia Salisbury, CMA, am acting as scribe for Sarina Ser, MD. ?Documentation: I have reviewed the above documentation for accuracy and completeness, and I agree with the above. ? ?Sarina Ser, MD ? ? ?

## 2022-03-09 DIAGNOSIS — G1221 Amyotrophic lateral sclerosis: Secondary | ICD-10-CM | POA: Insufficient documentation

## 2022-03-14 ENCOUNTER — Encounter: Payer: Self-pay | Admitting: Dermatology

## 2022-03-22 IMAGING — DX DG LUMBAR SPINE COMPLETE W/ BEND
7 series · 7 of 7 positions shown · non-contrast
Comparison: None.

CLINICAL DATA: Patient fell 1 week ago in bathtub with persistent
low back pain.

EXAM:
LUMBAR SPINE - COMPLETE WITH BENDING VIEWS

[l-spine ap]
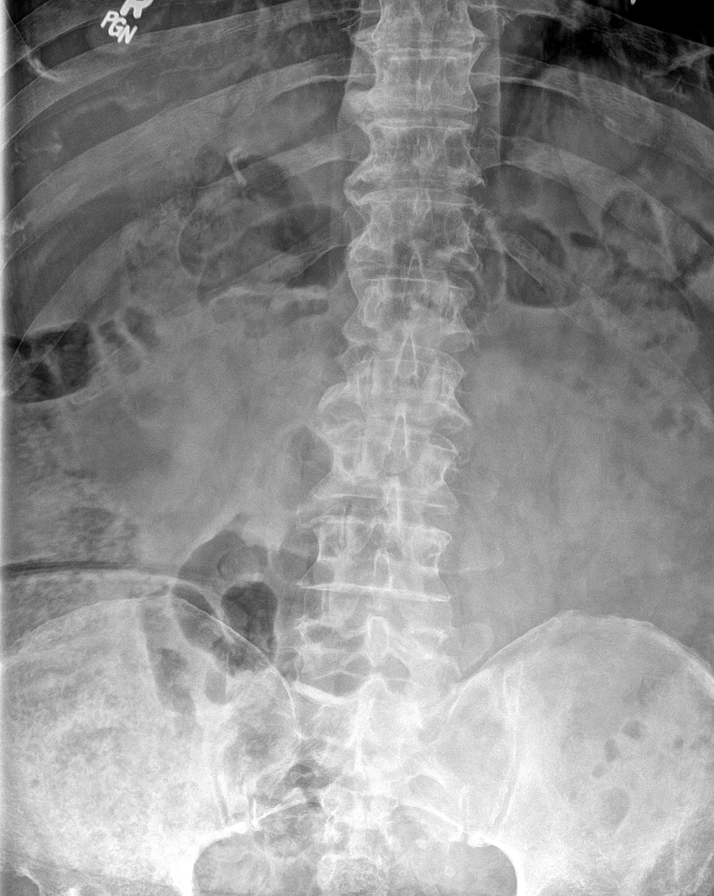

[l-spine obl (1 of 2)]
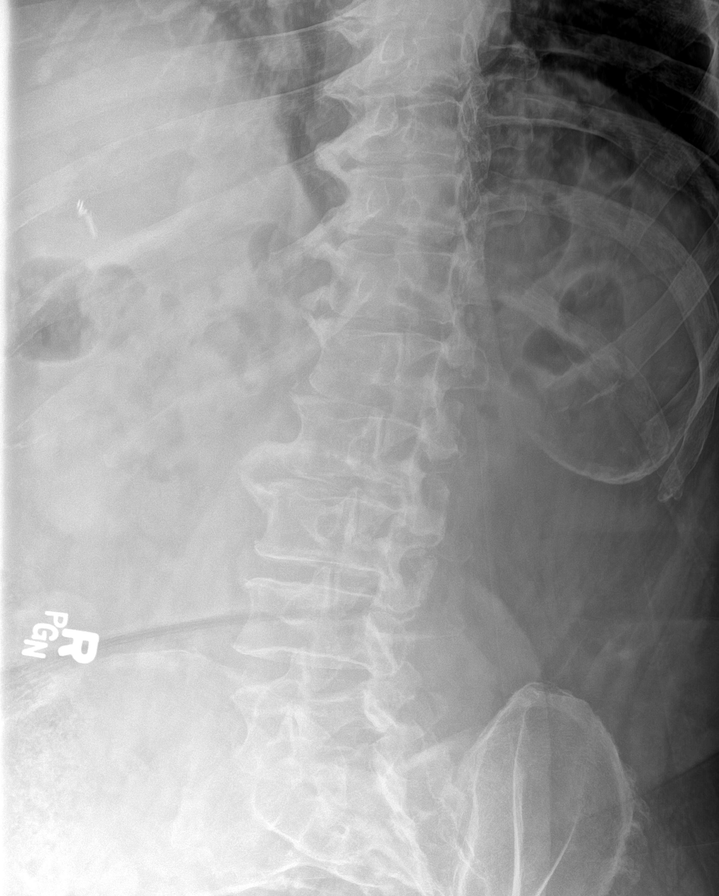

[l-spine obl (2 of 2)]
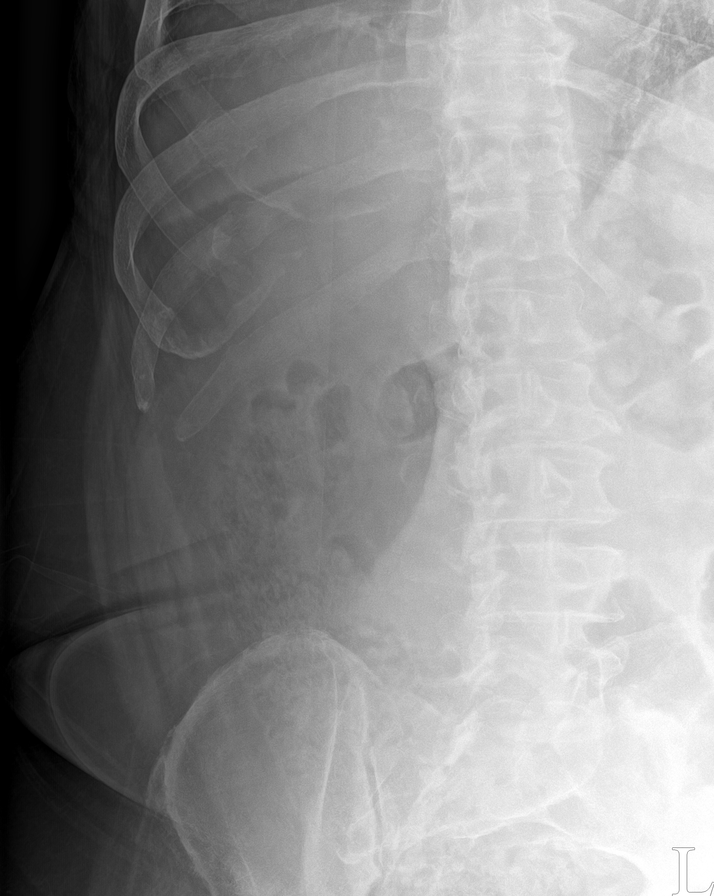

[l-spine lateral]
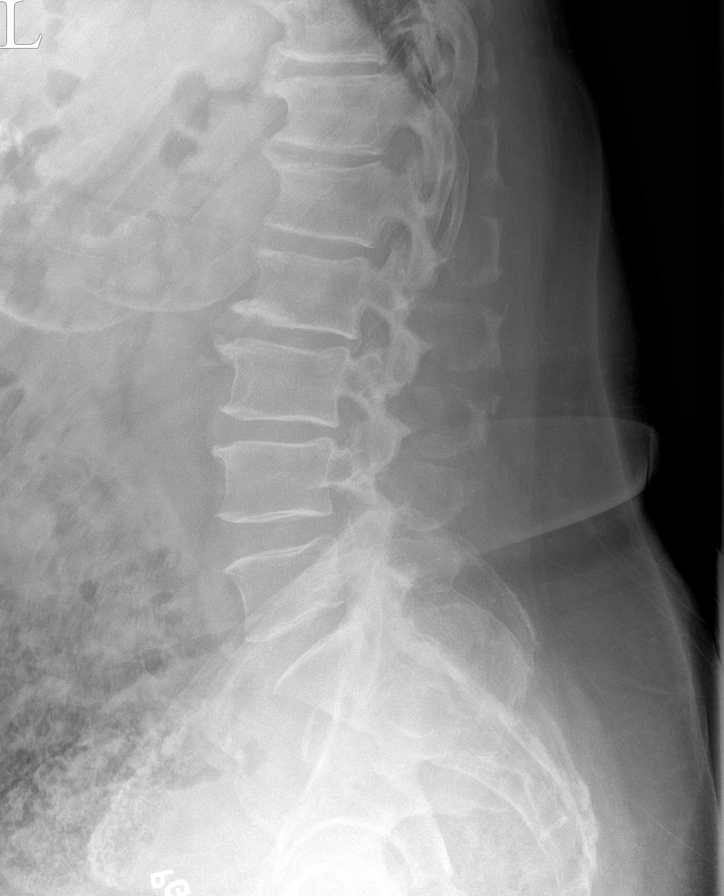

[l-spine spot]
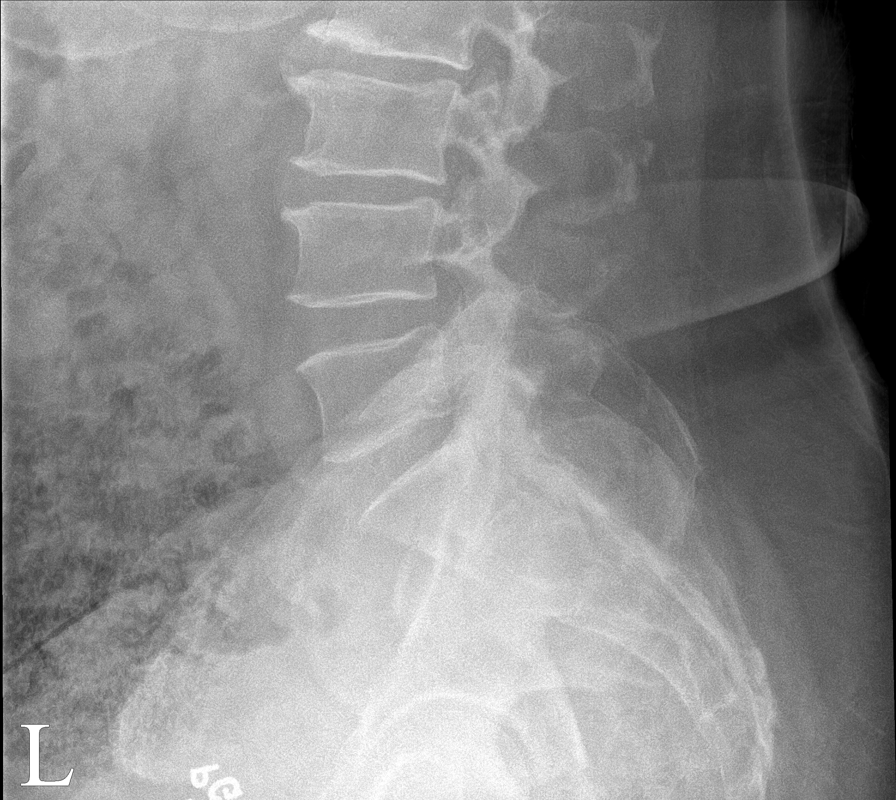

[l-spine flex]
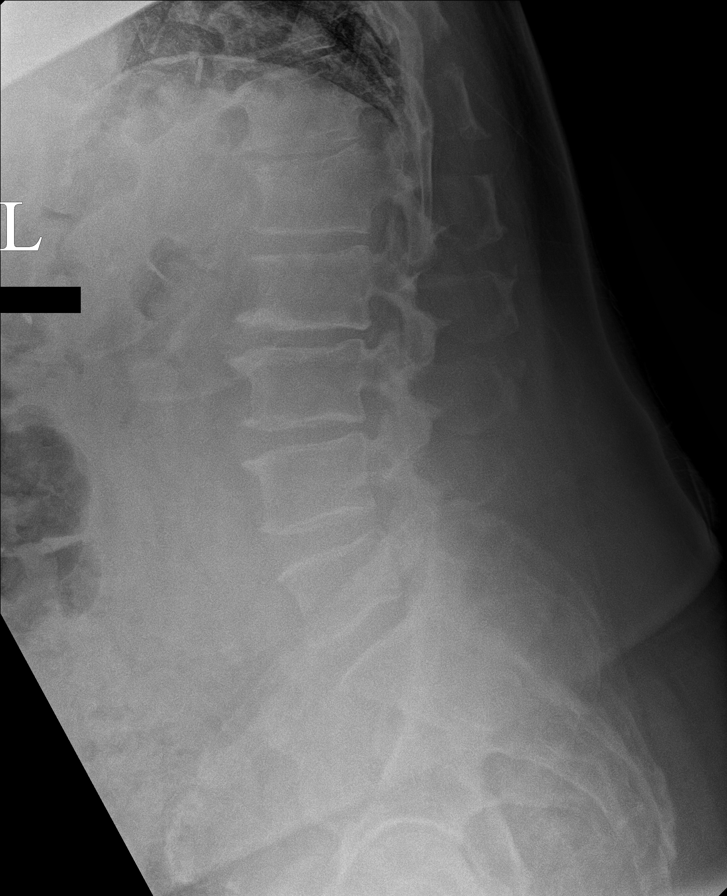

[l-spine ext]
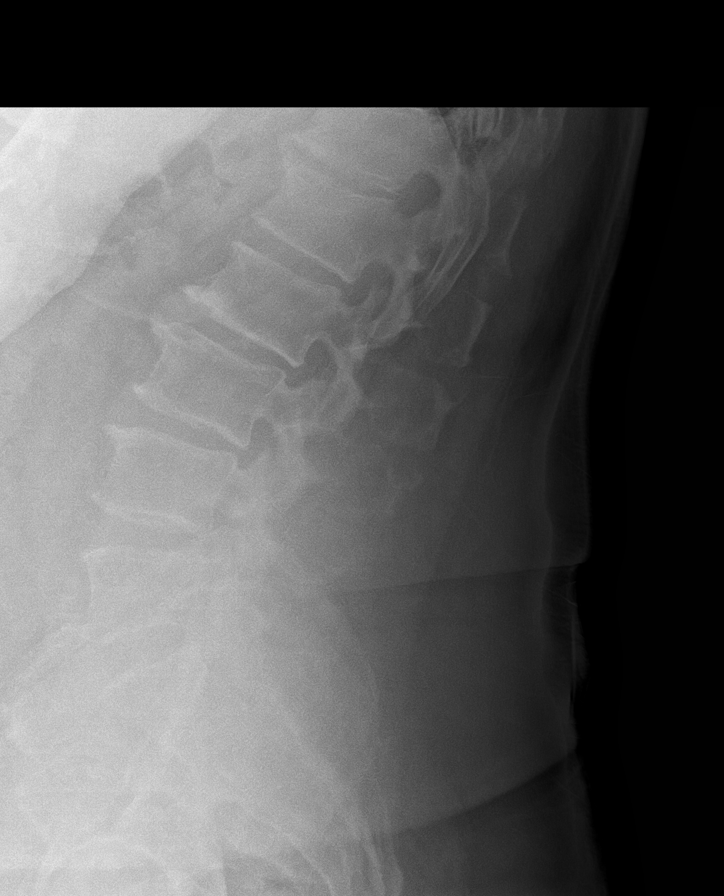

[7 of 7 positions shown; findings below may reference images not displayed]

FINDINGS: There are 5 non rib-bearing lumbar type vertebral bodies

Mild straightening expected lumbar lordosis. There is minimal
(approximately 3 mm) of retrolisthesis of L2 upon L3. The amount of
retrolisthesis is unchanged given the acquired degrees of flexion
and extension. No additional areas of anterolisthesis or
retrolisthesis are elicited given the acquired degrees of flexion
and extension. No definite pars defects.

Age-indeterminate mild (approximately 25%) compression deformities
involving the superior endplates of the T12 and L1 vertebral bodies.
Remaining lumbar vertebral body heights appear preserved

Mild to moderate multilevel DDD, worse at L2-L3 and L3-L4 with disc
space height loss, endplate irregularity and small anteriorly
directed disc osteophyte complexes at these locations.

Limited visualization of the bilateral SI joints is normal.

Large colonic stool burden without evidence of enteric obstruction.
Post cholecystectomy.
IMPRESSION: 1. Age-indeterminate mild (approximately 25%) compression
deformities involving the T12 and L1 vertebral bodies. Correlation
for point tenderness at these locations is advised.
2. Mild-to-moderate multilevel DDD, worse at L2-L3 and L3-L4.
3. No evidence of lumbar spine instability given the acquired
degrees of flexion and extension.

## 2022-04-27 ENCOUNTER — Ambulatory Visit: Payer: Medicare HMO

## 2022-04-30 ENCOUNTER — Ambulatory Visit (INDEPENDENT_AMBULATORY_CARE_PROVIDER_SITE_OTHER): Payer: Medicare HMO

## 2022-04-30 VITALS — BP 100/60 | Ht 70.0 in | Wt 234.0 lb

## 2022-04-30 DIAGNOSIS — Z0000 Encounter for general adult medical examination without abnormal findings: Secondary | ICD-10-CM

## 2022-04-30 NOTE — Progress Notes (Signed)
Subjective:   Jonathan Shelton is a 71 y.o. male who presents for Medicare Annual/Subsequent preventive examination.  Review of Systems           Objective:    Today's Vitals   04/30/22 1437  BP: 100/60  Weight: 234 lb (106.1 kg)  Height: '5\' 10"'$  (1.778 m)   Body mass index is 33.58 kg/m.     08/31/2021    4:52 PM 08/24/2021    9:09 AM 04/21/2021    3:05 PM 04/02/2021    9:30 PM 01/22/2021    8:29 AM 07/16/2020    3:21 PM 07/16/2020    1:47 PM  Advanced Directives  Does Patient Have a Medical Advance Directive? Yes Yes Yes No Yes Yes No  Type of Advance Directive Living will Living will [place];Living will  [place];Living will [place];Living will   Does patient want to make changes to medical advance directive? No - Patient declined     No - Patient declined   Copy of [place] in Chart?   No - copy requested  Yes - validated most recent copy scanned in chart (See row information) No - copy requested   Would patient like information on creating a medical advance directive?      No - Patient declined     Current Medications (verified) Outpatient Encounter Medications as of 04/30/2022  Medication Sig   Ascorbic Acid (VITAMIN C PO) Take 1 tablet by mouth 3 (three) times a week.   aspirin 81 MG chewable tablet Chew 1 tablet (81 mg total) by mouth 2 (two) times daily.   baclofen (LIORESAL) 10 MG tablet Take by mouth.   cholecalciferol (VITAMIN D3) 25 MCG (1000 UNIT) tablet Take 1,000 Units by mouth 3 (three) times a week.   Cholecalciferol 10 MCG (400 UNIT) CHEW Chew by mouth.   cyanocobalamin (,VITAMIN B-12,) 1000 MCG/ML injection Inject 1 mL (1,000 mcg total) into the muscle every 30 (thirty) days.   cyanocobalamin 1000 MCG tablet Take by mouth.   Dextromethorphan-quiNIDine (NUEDEXTA) 20-10 MG capsule Take by mouth.   finasteride (PROPECIA) 1 MG tablet Take by mouth.   finasteride (PROSCAR) 5 MG  tablet TAKE 1 TABLET (5 MG TOTAL) BY MOUTH DAILY.   glucose blood test strip Check blood sugar up to 4 times daily   insulin NPH Human (NOVOLIN N) 100 UNIT/ML injection INJECT 40 UNITS (0.4 ML) INTO THE SKIN AT BEDTIME. MAY USE REDUCED DOSE AS ADVISED.   Insulin Syringe-Needle U-100 31G X 1/4" 0.5 ML MISC Use syringe to inject insulin into skin daily.   JARDIANCE 25 MG TABS tablet TAKE 1 TABLET BY MOUTH EVERY DAY   metFORMIN (GLUCOPHAGE) 1000 MG tablet Take 1 tablet (1,000 mg total) by mouth 2 (two) times daily with a meal.   naproxen sodium (ALEVE) 220 MG tablet Take 220 mg by mouth. 3x times per day   Omega-3 Fatty Acids (FISH OIL) 1000 MG CAPS Take by mouth.   OZEMPIC, 1 MG/DOSE, 4 MG/3ML SOPN Inject 1 mg into the skin once a week.   PRALUENT 75 MG/ML SOAJ Inject 75 mg as directed every 14 (fourteen) days.   riluzole (RILUTEK) 50 MG tablet SMARTSIG:1 Tablet(s) By Mouth Every 12 Hours   Semaglutide, 1 MG/DOSE, (OZEMPIC, 1 MG/DOSE,) 2 MG/1.5ML SOPN Inject into the skin.   tamsulosin (FLOMAX) 0.4 MG CAPS capsule TAKE 2 CAPSULES BY MOUTH EVERY DAY   valsartan (DIOVAN) 80 MG  tablet TAKE 1 TABLET BY MOUTH EVERY DAY   vitamin B-12 (CYANOCOBALAMIN) 1000 MCG tablet Take 1 tablet (1,000 mcg total) by mouth 3 (three) times a week.   Vitamin E (VITAMIN E/D-ALPHA NATURAL) 268 MG (400 UNIT) CAPS Take by mouth.   zinc gluconate 50 MG tablet Take 50 mg by mouth 3 (three) times a week.   [DISCONTINUED] finasteride (PROSCAR) 5 MG tablet Take 1 tablet by mouth daily.   [DISCONTINUED] metFORMIN (GLUCOPHAGE) 1000 MG tablet Take 1 tablet by mouth daily.   carbidopa-levodopa (SINEMET IR) 25-100 MG tablet Take 1 tablet by mouth at bedtime. (Patient not taking: Reported on 04/30/2022)   docusate sodium (COLACE) 100 MG capsule Take 1 capsule (100 mg total) by mouth 2 (two) times daily. (Patient not taking: Reported on 04/30/2022)   gabapentin (NEURONTIN) 100 MG capsule Take 2 capsules (200 mg total) by mouth at bedtime.  (Patient not taking: Reported on 08/18/2021)   HYDROcodone-acetaminophen (NORCO/VICODIN) 5-325 MG tablet Take 1 tablet by mouth every 4 (four) hours as needed for moderate pain (pain score 4-6). (Patient not taking: Reported on 04/30/2022)   levalbuterol (XOPENEX HFA) 45 MCG/ACT inhaler Inhale 1-2 puffs into the lungs every 6 (six) hours as needed for wheezing. (Patient not taking: Reported on 04/30/2022)   meloxicam (MOBIC) 7.5 MG tablet Take 1 tablet (7.5 mg total) by mouth daily. (Patient not taking: Reported on 04/30/2022)   pimecrolimus (ELIDEL) 1 % cream Apply twice daily to affected groin areas as needed for itching. (Patient not taking: Reported on 04/30/2022)   tiZANidine (ZANAFLEX) 4 MG tablet tizanidine 4 mg tablet (Patient not taking: Reported on 04/30/2022)   [DISCONTINUED] albuterol (VENTOLIN HFA) 108 (90 Base) MCG/ACT inhaler Inhale 1-2 puffs into the lungs every 4 (four) hours as needed for wheezing or shortness of breath.   [DISCONTINUED] ascorbic acid (VITAMIN C) 500 MG tablet Take by mouth.   [DISCONTINUED] Baclofen 5 MG TABS baclofen 5 mg tablet   [DISCONTINUED] zinc gluconate 50 MG tablet Take 1 tablet by mouth daily.   No facility-administered encounter medications on file as of 04/30/2022.    Allergies (verified) Testosterone   History: Past Medical History:  Diagnosis Date   Allergy    Arthritis    Basal cell carcinoma    Face. x2. Mohs surgery in [place].   BPH (benign prostatic hyperplasia)    Colon polyps    Diabetes (HCC)    Diverticulosis    Gallstones    GERD (gastroesophageal reflux disease)    HOH (hard of hearing)    Kidney stones    Renal disorder    Wears glasses    Past Surgical History:  Procedure Laterality Date   BIOPSY  01/22/2021   Procedure: BIOPSY;  Surgeon: Rush Landmark, Telford Nab., MD;  Location: [place];  Service: Gastroenterology;;   CARPAL TUNNEL RELEASE     CHOLECYSTECTOMY     COLONOSCOPY WITH PROPOFOL N/A 02/06/2020   Procedure:  COLONOSCOPY WITH PROPOFOL;  Surgeon: Lin Landsman, MD;  [facility] END[facility];  Service: Gastroenterology;  Laterality: N/A;   COLONOSCOPY WITH PROPOFOL N/A 03/31/2020   Procedure: COLONOSCOPY WITH PROPOFOL;  Surgeon: Rush Landmark Telford Nab., MD;  Location: [place];  Service: Gastroenterology;  Laterality: N/A;   COLONOSCOPY WITH PROPOFOL N/A 01/22/2021   Procedure: COLONOSCOPY WITH PROPOFOL;  Surgeon: Rush Landmark Telford Nab., MD;  Location: [place];  Service: Gastroenterology;  Laterality: N/A;   ENDOSCOPIC MUCOSAL RESECTION N/A 03/31/2020   Procedure: ENDOSCOPIC MUCOSAL RESECTION;  Surgeon: Rush Landmark Telford Nab., MD;  Location: [facility] ENDOSCOPY;  Service: Gastroenterology;  Laterality: N/A;   HEMOSTASIS CLIP PLACEMENT  03/31/2020   Procedure: HEMOSTASIS CLIP PLACEMENT;  Surgeon: Irving Copas., MD;  Location: [place];  Service: Gastroenterology;;   HERNIA REPAIR     IR KYPHO LUMBAR INC FX REDUCE BONE BX UNI/BIL CANNULATION INC/IMAGING  02/05/2021   IR RADIOLOGIST EVAL & MGMT  11/28/2020   IR RADIOLOGIST EVAL & MGMT  02/26/2021   POLYPECTOMY  03/31/2020   Procedure: POLYPECTOMY;  Surgeon: Irving Copas., MD;  Location: [place];  Service: Gastroenterology;;   POLYPECTOMY  01/22/2021   Procedure: POLYPECTOMY;  Surgeon: Irving Copas., MD;  Location: [place];  Service: Gastroenterology;;   SMALL INTESTINE SURGERY     SUBMUCOSAL LIFTING INJECTION  03/31/2020   Procedure: SUBMUCOSAL LIFTING INJECTION;  Surgeon: Irving Copas., MD;  Location: [place];  Service: Gastroenterology;;   TOTAL KNEE ARTHROPLASTY Right 08/31/2021   Procedure: TOTAL KNEE ARTHROPLASTY;  Surgeon: Lovell Sheehan, MD;  Location: [facility];  Service: Orthopedics;  Laterality: Right;   Family History  Problem Relation Age of Onset   Heart disease Mother 39   Heart disease Father    Stroke Father 72   Diabetes Father    Heart attack Father    Liver disease Neg  Hx    Colon cancer Neg Hx    Esophageal cancer Neg Hx    Pancreatic cancer Neg Hx    Stomach cancer Neg Hx    Inflammatory bowel disease Neg Hx    Rectal cancer Neg Hx    Social History   Socioeconomic History   Marital status: Married    Spouse name: Not on file   Number of children: Not on file   Years of education: Graduate Degree   Highest education level: Master's degree (e.g., MA, MS, MEng, MEd, MSW, MBA)  Occupational History   Not on file  Tobacco Use   Smoking status: Former    Types: Cigarettes    Quit date: 1970    Years since quitting: 53.4   Smokeless tobacco: Former    Types: Snuff  Vaping Use   Vaping Use: Never used  Substance and Sexual Activity   Alcohol use: Not Currently   Drug use: Not Currently    Comment: past   Sexual activity: Not on file  Other Topics Concern   Not on file  Social History Narrative   Not on file   Social Determinants of Health   Financial Resource Strain: Low Risk  (04/21/2021)   Overall Financial Resource Strain (CARDIA)    Difficulty of Paying Living Expenses: Not hard at all  Food Insecurity: No Food Insecurity (04/21/2021)   Hunger Vital Sign    Worried About Running Out of Food in the Last Year: Never true    [place] in the Last Year: Never true  Transportation Needs: No Transportation Needs (04/21/2021)   PRAPARE - Hydrologist (Medical): No    Lack of Transportation (Non-Medical): No  Physical Activity: Inactive (04/21/2021)   Exercise Vital Sign    Days of Exercise per Week: 0 days    Minutes of Exercise per Session: 0 min  Stress: No Stress Concern Present (04/30/2022)   [place]    Feeling of Stress : Only a little  Social Connections: Not on file    Tobacco Counseling Counseling given: Not Answered   Clinical Intake:  Pre-visit  preparation completed: Yes  Pain : No/denies pain     Nutritional Risks:  None Diabetes: Yes CBG done?: No Did pt. bring in CBG monitor from home?: No  How often do you need to have someone help you when you read instructions, pamphlets, or other written materials from your doctor or pharmacy?: 1 - Never  Diabetic?yes Nutrition Risk Assessment:  Has the patient had any N/V/D within the last 2 months?  No  Does the patient have any non-healing wounds?  No  Has the patient had any unintentional weight loss or weight gain?  No   Diabetes:  Is the patient diabetic?  Yes  If diabetic, was a CBG obtained today?  No  Did the patient bring in their glucometer from home?  No  How often do you monitor your CBG's? Three times/day.   Financial Strains and Diabetes Management:  Are you having any financial strains with the device, your supplies or your medication? No .  Does the patient want to be seen by Chronic Care Management for management of their diabetes?  No  Would the patient like to be referred to a Nutritionist or for Diabetic Management?  No   Diabetic Exams:  Diabetic Eye Exam: Completed 6 months ago.  Pt has been advised about the importance in completing this exam.   Diabetic Foot Exam: Completed no. Pt has been advised about the importance in completing this exam.    Interpreter Needed?: No  Information entered by :: Kirke Shaggy, LPN   Activities of Daily Living    08/31/2021    4:52 PM 08/31/2021    4:00 PM  In your present state of health, do you have any difficulty performing the following activities:  Hearing? 0   Vision? 1   Difficulty concentrating or making decisions? 0   Walking or climbing stairs? 0   Dressing or bathing? 0   Doing errands, shopping? 0 0    Patient Care Team: [name], DO as PCP - General (Family Medicine)  Indicate any recent Medical Services you may have received from other than [facility] providers in the past year (date may be approximate).     Assessment:   This is a routine wellness  examination for [place].  Hearing/Vision screen No results found.  Dietary issues and exercise activities discussed:     Goals Addressed             This Visit's Progress    DIET - EAT MORE FRUITS AND VEGETABLES         Depression Screen    04/30/2022    2:51 PM 06/02/2021    1:47 PM 04/21/2021    3:06 PM 03/18/2020   11:42 AM 02/11/2020    1:26 PM 10/08/2019   11:09 AM 07/04/2019    9:56 AM  PHQ 2/9 Scores  PHQ - 2 Score 0 2 6 0 0 0 2  PHQ- 9 Score 0 '11 16    6    '$ Fall Risk    06/02/2021    1:49 PM 06/02/2021    1:47 PM 04/21/2021    3:05 PM 11/24/2020    2:03 PM 08/19/2020    1:26 PM  Fall Risk   Falls in the past year? 1 0 1 1 0  Number falls in past yr: 1 0 1 1 0  Injury with Fall? 0 0 1 1 0  Comment   broken arm    Risk for fall due to :  Impaired balance/gait;Medication side effect;Impaired mobility History of fall(s)   Follow up Falls evaluation completed Falls evaluation completed Falls evaluation completed;Education provided;Falls prevention discussed  Falls evaluation completed    FALL RISK PREVENTION PERTAINING TO THE HOME:  Any stairs in or around the home? Yes  If so, are there any without handrails? No  Home free of loose throw rugs in walkways, pet beds, electrical cords, etc? Yes  Adequate lighting in your home to reduce risk of falls? Yes   ASSISTIVE DEVICES UTILIZED TO PREVENT FALLS:  Life alert? No  Use of a cane, walker or w/c? No  Grab bars in the bathroom? Yes  Shower chair or bench in shower? Yes  Elevated toilet seat or a handicapped toilet? Yes   TIMED UP AND GO:  Was the test performed? Yes .  Length of time to ambulate 10 feet: 6 sec.   Gait slow and steady without use of assistive device  Cognitive Function:        12/03/2021   10:23 AM 04/21/2021    3:17 PM  6CIT Screen  What Year? 0 points 0 points  What month? 0 points 0 points  What time? 0 points 0 points  Count back from 20 0 points 0 points  Months in reverse 0 points  0 points  Repeat phrase 0 points 0 points  Total Score 0 points 0 points    Immunizations Immunization History  Administered Date(s) Administered   Fluad Quad(high Dose 65+) 09/04/2019, 08/19/2020, 09/01/2021   Influenza, High Dose Seasonal PF 09/08/2017   Pneumococcal Conjugate-13 08/19/2020    TDAP status: Due, Education has been provided regarding the importance of this vaccine. Advised may receive this vaccine at local pharmacy or Health Dept. Aware to provide a copy of the vaccination record if obtained from local pharmacy or Health Dept. Verbalized acceptance and understanding.  Flu Vaccine status: Up to date  Pneumococcal vaccine status: Due, Education has been provided regarding the importance of this vaccine. Advised may receive this vaccine at local pharmacy or Health Dept. Aware to provide a copy of the vaccination record if obtained from local pharmacy or Health Dept. Verbalized acceptance and understanding.  Covid-19 vaccine status: Declined, Education has been provided regarding the importance of this vaccine but patient still declined. Advised may receive this vaccine at local pharmacy or Health Dept.or vaccine clinic. Aware to provide a copy of the vaccination record if obtained from local pharmacy or Health Dept. Verbalized acceptance and understanding.  Qualifies for Shingles Vaccine? Yes   Zostavax completed No   Shingrix Completed?: No.    Education has been provided regarding the importance of this vaccine. Patient has been advised to call insurance company to determine out of pocket expense if they have not yet received this vaccine. Advised may also receive vaccine at local pharmacy or Health Dept. Verbalized acceptance and understanding.  Screening Tests Health Maintenance  Topic Date Due   COVID-19 Vaccine (1) Never done   TETANUS/TDAP  Never done   Zoster Vaccines- Shingrix (1 of 2) Never done   OPHTHALMOLOGY EXAM  04/03/2021   Pneumonia Vaccine 90+ Years old  (2 - PPSV23 if available, else PCV20) 08/19/2021   FOOT EXAM  08/19/2021   COLONOSCOPY (Pts 45-15yr Insurance coverage will need to be confirmed)  01/22/2022   HEMOGLOBIN A1C  05/27/2022   INFLUENZA VACCINE  06/15/2022   Hepatitis C Screening  Completed   HPV VACCINES  Aged Out   Fecal DNA (Cologuard)  Discontinued  Health Maintenance  Health Maintenance Due  Topic Date Due   COVID-19 Vaccine (1) Never done   TETANUS/TDAP  Never done   Zoster Vaccines- Shingrix (1 of 2) Never done   OPHTHALMOLOGY EXAM  04/03/2021   Pneumonia Vaccine 20+ Years old (2 - PPSV23 if available, else PCV20) 08/19/2021   FOOT EXAM  08/19/2021   COLONOSCOPY (Pts 45-62yr Insurance coverage will need to be confirmed)  01/22/2022    Colorectal cancer screening: Type of screening: Colonoscopy. Completed 01/22/21. Repeat every 3 years  Lung Cancer Screening: (Low Dose CT Chest recommended if Age 71-80years, 30 pack-year currently smoking OR have quit w/in 15years.) does not qualify.   Additional Screening:  Hepatitis C Screening: does qualify; Completed 08/12/20  Vision Screening: Recommended annual ophthalmology exams for early detection of glaucoma and other disorders of the eye. Is the patient up to date with their annual eye exam?  Yes  Who is the provider or what is the name of the office in which the patient attends annual [facility] exams[facility] is not established with a provider, would they like to be referred to a provider to establish care? No .   Dental Screening: Recommended annual dental exams for proper oral hygiene  Community Resource Referral / Chronic Care Management: CRR required this visit?  No   CCM required this visit?  No      Plan:     I have personally reviewed and noted the following in the patient's chart:   Medical and social history Use of alcohol, tobacco or illicit drugs  Current medications and supplements including opioid prescriptions. Patient is  not currently taking opioid prescriptions. Functional ability and status Nutritional status Physical activity Advanced directives List of other physicians Hospitalizations, surgeries, and ER visits in previous 12 months Vitals Screenings to include cognitive, depression, and falls Referrals and appointments  In addition, I have reviewed and discussed with patient certain preventive protocols, quality metrics, and best practice recommendations. A written personalized care plan for preventive services as well as general preventive health recommendations were provided to patient.     LDionisio David LPN   [id number]  Nurse Notes: none

## 2022-04-30 NOTE — Patient Instructions (Signed)
Jonathan Shelton , Thank you for taking time to come for your Medicare Wellness Visit. I appreciate your ongoing commitment to your health goals. Please review the following plan we discussed and let me know if I can assist you in the future.   Screening recommendations/referrals: Colonoscopy: 01/22/21 Recommended yearly ophthalmology/optometry visit for glaucoma screening and checkup Recommended yearly dental visit for hygiene and checkup  Vaccinations: Influenza vaccine: 09/01/21 Pneumococcal vaccine: 08/19/20 Tdap vaccine: n/d Shingles vaccine: n/d   Covid-19: n/d  Advanced directives: yes, copy needed  Conditions/risks identified: none  Next appointment: Follow up in one year for your annual wellness visit. 05/06/23 @ 1:30 pm in person  Preventive Care 71 Years and Older, Male Preventive care refers to lifestyle choices and visits with your health care provider that can promote health and wellness. What does preventive care include? A yearly physical exam. This is also called an annual well check. Dental exams once or twice a year. Routine eye exams. Ask your health care provider how often you should have your eyes checked. Personal lifestyle choices, including: Daily care of your teeth and gums. Regular physical activity. Eating a healthy diet. Avoiding tobacco and drug use. Limiting alcohol use. Practicing safe sex. Taking low doses of aspirin every day. Taking vitamin and mineral supplements as recommended by your health care provider. What happens during an annual well check? The services and screenings done by your health care provider during your annual well check will depend on your age, overall health, lifestyle risk factors, and family history of disease. Counseling  Your health care provider may ask you questions about your: Alcohol use. Tobacco use. Drug use. Emotional well-being. Home and relationship well-being. Sexual activity. Eating habits. History of  falls. Memory and ability to understand (cognition). Work and work Statistician. Screening  You may have the following tests or measurements: Height, weight, and BMI. Blood pressure. Lipid and cholesterol levels. These may be checked every 5 years, or more frequently if you are over 71 years old. Skin check. Lung cancer screening. You may have this screening every year starting at age 71 if you have a 30-pack-year history of smoking and currently smoke or have quit within the past 15 years. Fecal occult blood test (FOBT) of the stool. You may have this test every year starting at age 71. Flexible sigmoidoscopy or colonoscopy. You may have a sigmoidoscopy every 5 years or a colonoscopy every 10 years starting at age 71. Prostate cancer screening. Recommendations will vary depending on your family history and other risks. Hepatitis C blood test. Hepatitis B blood test. Sexually transmitted disease (STD) testing. Diabetes screening. This is done by checking your blood sugar (glucose) after you have not eaten for a while (fasting). You may have this done every 1-3 years. Abdominal aortic aneurysm (AAA) screening. You may need this if you are a current or former smoker. Osteoporosis. You may be screened starting at age 71 if you are at high risk. Talk with your health care provider about your test results, treatment options, and if necessary, the need for more tests. Vaccines  Your health care provider may recommend certain vaccines, such as: Influenza vaccine. This is recommended every year. Tetanus, diphtheria, and acellular pertussis (Tdap, Td) vaccine. You may need a Td booster every 10 years. Zoster vaccine. You may need this after age 12. Pneumococcal 13-valent conjugate (PCV13) vaccine. One dose is recommended after age 71. Pneumococcal polysaccharide (PPSV23) vaccine. One dose is recommended after age 71. Talk to your health care  provider about which screenings and vaccines you need and  how often you need them. This information is not intended to replace advice given to you by your health care provider. Make sure you discuss any questions you have with your health care provider. Document Released: 11/28/2015 Document Revised: 07/21/2016 Document Reviewed: 09/02/2015 Elsevier Interactive Patient Education  2017 [place] Prevention in the Home Falls can cause injuries. They can happen to people of all ages. There are many things you can do to make your home safe and to help prevent falls. What can I do on the outside of my home? Regularly fix the edges of walkways and driveways and fix any cracks. Remove anything that might make you trip as you walk through a door, such as a raised step or threshold. Trim any bushes or trees on the path to your home. Use bright outdoor lighting. Clear any walking paths of anything that might make someone trip, such as rocks or tools. Regularly check to see if handrails are loose or broken. Make sure that both sides of any steps have handrails. Any raised decks and porches should have guardrails on the edges. Have any leaves, snow, or ice cleared regularly. Use sand or salt on walking paths during winter. Clean up any spills in your garage right away. This includes oil or grease spills. What can I do in the bathroom? Use night lights. Install grab bars by the toilet and in the tub and shower. Do not use towel bars as grab bars. Use non-skid mats or decals in the tub or shower. If you need to sit down in the shower, use a plastic, non-slip stool. Keep the floor dry. Clean up any water that spills on the floor as soon as it happens. Remove soap buildup in the tub or shower regularly. Attach bath mats securely with double-sided non-slip rug tape. Do not have throw rugs and other things on the floor that can make you trip. What can I do in the bedroom? Use night lights. Make sure that you have a light by your bed that is easy to  reach. Do not use any sheets or blankets that are too big for your bed. They should not hang down onto the floor. Have a firm chair that has side arms. You can use this for support while you get dressed. Do not have throw rugs and other things on the floor that can make you trip. What can I do in the kitchen? Clean up any spills right away. Avoid walking on wet floors. Keep items that you use a lot in easy-to-reach places. If you need to reach something above you, use a strong step stool that has a grab bar. Keep electrical cords out of the way. Do not use floor polish or wax that makes floors slippery. If you must use wax, use non-skid floor wax. Do not have throw rugs and other things on the floor that can make you trip. What can I do with my stairs? Do not leave any items on the stairs. Make sure that there are handrails on both sides of the stairs and use them. Fix handrails that are broken or loose. Make sure that handrails are as long as the stairways. Check any carpeting to make sure that it is firmly attached to the stairs. Fix any carpet that is loose or worn. Avoid having throw rugs at the top or bottom of the stairs. If you do have throw rugs, attach them to the  floor with carpet tape. Make sure that you have a light switch at the top of the stairs and the bottom of the stairs. If you do not have them, ask someone to add them for you. What else can I do to help prevent falls? Wear shoes that: Do not have high heels. Have rubber bottoms. Are comfortable and fit you well. Are closed at the toe. Do not wear sandals. If you use a stepladder: Make sure that it is fully opened. Do not climb a closed stepladder. Make sure that both sides of the stepladder are locked into place. Ask someone to hold it for you, if possible. Clearly mark and make sure that you can see: Any grab bars or handrails. First and last steps. Where the edge of each step is. Use tools that help you move  around (mobility aids) if they are needed. These include: Canes. Walkers. Scooters. Crutches. Turn on the lights when you go into a dark area. Replace any light bulbs as soon as they burn out. Set up your furniture so you have a clear path. Avoid moving your furniture around. If any of your floors are uneven, fix them. If there are any pets around you, be aware of where they are. Review your medicines with your doctor. Some medicines can make you feel dizzy. This can increase your chance of falling. Ask your doctor what other things that you can do to help prevent falls. This information is not intended to replace advice given to you by your health care provider. Make sure you discuss any questions you have with your health care provider. Document Released: 08/28/2009 Document Revised: 04/08/2016 Document Reviewed: 12/06/2014 Elsevier Interactive Patient Education  2017 [employer].

## 2022-05-05 ENCOUNTER — Telehealth: Payer: Self-pay | Admitting: Family Medicine

## 2022-05-05 ENCOUNTER — Ambulatory Visit: Payer: Medicare HMO | Admitting: Family Medicine

## 2022-05-05 NOTE — Telephone Encounter (Signed)
Jonathan David, LPN  Tamotsu Wiederholt, Devonne Doughty, DO Hi Dr.K, I saw him for his AWV. I made an appt for him for f/u at the patient's request. He wanted to get labs before he sees you next Thursday but he wasn't sure what he needed. Can you order labs so he can have them done before his visit? Thanks!    Patient scheduled for 05/10/22 to see me  Only lab I see he mainly needs is A1c.  He can do this fingerstick or can come in for lab.  Other labs have been done in April 2023 or earlier. So I don't see any other required labs  Can you notify patient and arrange for him to get A1c fingerstick at apt or if he prefers lab earlier  Nobie Putnam, [facility] 05/05/2022, 12:53 PM

## 2022-05-10 ENCOUNTER — Encounter: Payer: Self-pay | Admitting: Family Medicine

## 2022-05-10 ENCOUNTER — Ambulatory Visit: Payer: Medicare HMO | Admitting: Family Medicine

## 2022-05-10 VITALS — BP 137/71 | HR 80 | Ht 70.0 in | Wt 234.0 lb

## 2022-05-10 DIAGNOSIS — E1169 Type 2 diabetes mellitus with other specified complication: Secondary | ICD-10-CM | POA: Diagnosis not present

## 2022-05-10 DIAGNOSIS — K644 Residual hemorrhoidal skin tags: Secondary | ICD-10-CM

## 2022-05-10 DIAGNOSIS — G1221 Amyotrophic lateral sclerosis: Secondary | ICD-10-CM | POA: Diagnosis not present

## 2022-05-10 DIAGNOSIS — Z794 Long term (current) use of insulin: Secondary | ICD-10-CM | POA: Diagnosis not present

## 2022-05-10 MED ORDER — HYDROCORTISONE ACETATE 25 MG RE SUPP: 25.0000 mg | Freq: Two times a day (BID) | RECTAL | 2 refills | Status: DC

## 2022-05-11 ENCOUNTER — Encounter: Payer: Self-pay | Admitting: Family Medicine

## 2022-05-11 DIAGNOSIS — N401 Enlarged prostate with lower urinary tract symptoms: Secondary | ICD-10-CM

## 2022-05-11 DIAGNOSIS — E1169 Type 2 diabetes mellitus with other specified complication: Secondary | ICD-10-CM

## 2022-05-11 LAB — HEPATIC FUNCTION PANEL
AG Ratio: 1.8 (calc) (ref 1.0–2.5)
ALT: 15 U/L (ref 9–46)
AST: 15 U/L (ref 10–35)
Albumin: 4.2 g/dL (ref 3.6–5.1)
Alkaline phosphatase (APISO): 76 U/L (ref 35–144)
Bilirubin, Direct: 0.1 mg/dL (ref 0.0–0.2)
Globulin: 2.3 g/dL (calc) (ref 1.9–3.7)
Indirect Bilirubin: 0.4 mg/dL (calc) (ref 0.2–1.2)
Total Bilirubin: 0.5 mg/dL (ref 0.2–1.2)
Total Protein: 6.5 g/dL (ref 6.1–8.1)

## 2022-05-11 LAB — HEMOGLOBIN A1C
Hgb A1c MFr Bld: 5.7 % of total Hgb — ABNORMAL HIGH (ref <5.7)
Mean Plasma Glucose: 117 mg/dL
eAG (mmol/L): 6.5 mmol/L

## 2022-05-12 ENCOUNTER — Other Ambulatory Visit: Payer: Self-pay | Admitting: Family Medicine

## 2022-05-12 DIAGNOSIS — I10 Essential (primary) hypertension: Secondary | ICD-10-CM

## 2022-05-12 DIAGNOSIS — N401 Enlarged prostate with lower urinary tract symptoms: Secondary | ICD-10-CM

## 2022-05-12 NOTE — Telephone Encounter (Signed)
Requested Prescriptions  Pending Prescriptions Disp Refills  . valsartan (DIOVAN) 80 MG tablet [Pharmacy Med Name: VALSARTAN 80 MG TABLET] 90 tablet 0    Sig: TAKE 1 TABLET BY MOUTH EVERY DAY     Cardiovascular:  Angiotensin Receptor Blockers Passed - 05/12/2022  8:14 AM      Passed - Cr in normal range and within 180 days    Creat  Date Value Ref Range Status  11/27/2021 0.75 0.70 - 1.28 mg/dL Final         Passed - K in normal range and within 180 days    Potassium  Date Value Ref Range Status  11/27/2021 4.1 3.5 - 5.3 mmol/L Final         Passed - Patient is not pregnant      Passed - Last BP in normal range    BP Readings from Last 1 Encounters:  05/10/22 137/71         Passed - Valid encounter within last 6 months    Recent Outpatient Visits          2 days ago ALS (amyotrophic lateral sclerosis) ([place])   [facility] [name], DO   5 months ago ALS (amyotrophic lateral sclerosis) [facility])   [facility] [name], DO   9 months ago COVID-19 virus infection   [facility], Devonne Doughty, DO   11 months ago Type 2 diabetes mellitus with other specified complication, with long-term current use of insulin [facility])   [facility], Devonne Doughty, DO   1 year ago Type 2 diabetes mellitus with other specified complication, with long-term current use of insulin [facility])   [facility] [name], DO      Future Appointments            In 10 months Ralene Bathe, MD [place]           . finasteride (PROSCAR) 5 MG tablet [Pharmacy Med Name: FINASTERIDE 5 MG TABLET] 90 tablet 1    Sig: TAKE 1 TABLET (5 MG TOTAL) BY MOUTH DAILY.     Urology: 5-alpha Reductase Inhibitors Passed - 05/12/2022  8:14 AM      Passed - PSA in normal range and within 360 days    PSA  Date Value Ref Range Status  11/27/2021 0.26 < OR = 4.00 ng/mL  Final    Comment:    The total PSA value from this assay system is  standardized against the WHO standard. The test  result will be approximately 20% lower when compared  to the equimolar-standardized total PSA (Beckman  Coulter). Comparison of serial PSA results should be  interpreted with this fact in mind. . This test was performed using the Siemens  chemiluminescent method. Values obtained from  different assay methods cannot be used interchangeably. PSA levels, regardless of value, should not be interpreted as absolute evidence of the presence or absence of disease.          Passed - Valid encounter within last 12 months    Recent Outpatient Visits          2 days ago ALS (amyotrophic lateral sclerosis) [facility])   [place], DO   5 months ago ALS (amyotrophic lateral sclerosis) [facility])   [place], DO   9 months ago COVID-19 virus infection   [facility], Devonne Doughty, DO   11 months ago Type 2 diabetes mellitus with other specified complication, with long-term current use of insulin [facility])   [place], DO   1 year ago Type 2 diabetes mellitus with other specified complication, with long-term current use of insulin [facility])   [facility] [name], DO      Future Appointments            In 10 months Ralene Bathe, MD [place]

## 2022-05-14 ENCOUNTER — Other Ambulatory Visit: Payer: Self-pay | Admitting: Family Medicine

## 2022-05-14 DIAGNOSIS — E1169 Type 2 diabetes mellitus with other specified complication: Secondary | ICD-10-CM

## 2022-05-14 MED ORDER — MOUNJARO 5 MG/0.5ML ~~LOC~~ SOAJ: 5.0000 mg | SUBCUTANEOUS | 2 refills | Status: DC

## 2022-05-14 NOTE — Addendum Note (Signed)
Addended by: [name] on: 05/14/2022 06:19 PM   Modules accepted: Orders

## 2022-05-15 ENCOUNTER — Other Ambulatory Visit: Payer: Self-pay | Admitting: Family Medicine

## 2022-05-15 DIAGNOSIS — G720 Drug-induced myopathy: Secondary | ICD-10-CM

## 2022-05-15 DIAGNOSIS — I6930 Unspecified sequelae of cerebral infarction: Secondary | ICD-10-CM

## 2022-05-15 DIAGNOSIS — E1169 Type 2 diabetes mellitus with other specified complication: Secondary | ICD-10-CM

## 2022-05-16 ENCOUNTER — Other Ambulatory Visit: Payer: Self-pay | Admitting: Family Medicine

## 2022-05-16 DIAGNOSIS — Z794 Long term (current) use of insulin: Secondary | ICD-10-CM

## 2022-05-16 DIAGNOSIS — E538 Deficiency of other specified B group vitamins: Secondary | ICD-10-CM

## 2022-05-17 MED ORDER — MOUNJARO 5 MG/0.5ML ~~LOC~~ SOAJ: 5.0000 mg | SUBCUTANEOUS | 0 refills | Status: DC

## 2022-05-17 NOTE — Telephone Encounter (Signed)
Requested Prescriptions  Pending Prescriptions Disp Refills  . JARDIANCE 25 MG TABS tablet [Pharmacy Med Name: JARDIANCE 25 MG TABLET] 90 tablet 1    Sig: TAKE 1 TABLET BY MOUTH EVERY DAY     Endocrinology:  Diabetes - SGLT2 Inhibitors Passed - 05/16/2022  9:28 AM      Passed - Cr in normal range and within 360 days    Creat  Date Value Ref Range Status  11/27/2021 0.75 0.70 - 1.28 mg/dL Final         Passed - HBA1C is between 0 and 7.9 and within 180 days    Hgb A1c MFr Bld  Date Value Ref Range Status  05/10/2022 5.7 (H) <5.7 % of total Hgb Final    Comment:    For someone without known diabetes, a hemoglobin  A1c value between 5.7% and 6.4% is consistent with prediabetes and should be confirmed with a  follow-up test. . For someone with known diabetes, a value <7% indicates that their diabetes is well controlled. A1c targets should be individualized based on duration of diabetes, age, comorbid conditions, and other considerations. . This assay result is consistent with an increased risk of diabetes. . Currently, no consensus exists regarding use of hemoglobin A1c for diagnosis of diabetes for children. .          Passed - eGFR in normal range and within 360 days    GFR, Est African American  Date Value Ref Range Status  08/12/2020 112 > OR = 60 mL/min/1.54m Final   GFR, Est Non African American  Date Value Ref Range Status  08/12/2020 96 > OR = 60 mL/min/1.717mFinal   GFR, Estimated  Date Value Ref Range Status  09/01/2021 >60 >60 mL/min Final    Comment:    (NOTE) Calculated using the CKD-EPI Creatinine Equation (2021)    eGFR  Date Value Ref Range Status  11/27/2021 97 > OR = 60 mL/min/1.7374minal    Comment:    The eGFR is based on the CKD-EPI 2021 equation. To calculate  the new eGFR from a previous Creatinine or Cystatin C result, go to https://www.kidney.org/professionals/ kdoqi/gfr%5Fcalculator          Passed - Valid encounter within last  6 months    Recent Outpatient Visits          1 week ago [facility]amyotrophic lateral sclerosis) [facility]   5 months ago [facility]   9 months ago [facility]19 virus [facility]   [facility]   11 months ago Type 2 diabetes mellitus with other specified complication, with long-term current use of insulin [facility]   1 year ago Type 2 diabetes mellitus with other specified complication, with long-term current use of insulin ([facility]      Future Appointments            In 9 months [place]ene [place]

## 2022-05-17 NOTE — Telephone Encounter (Signed)
Requested Prescriptions  Pending Prescriptions Disp Refills  . PRALUENT 75 MG/ML SOAJ [Pharmacy Med Name: PRALUENT 75 MG/ML PEN] 6 mL 3    Sig: INJECT 75 MG AS DIRECTED EVERY 14 (FOURTEEN) DAYS.     Cardiovascular: PCSK9 Inhibitors Passed - 05/15/2022  9:02 AM      Passed - Valid encounter within last 12 months    Recent Outpatient Visits          1 week ago ALS (amyotrophic lateral sclerosis) ([place])   [facility] [name], DO   5 months ago ALS (amyotrophic lateral sclerosis) [facility])   [facility] [name], DO   9 months ago COVID-19 virus infection   [facility] [name], DO   11 months ago Type 2 diabetes mellitus with other specified complication, with long-term current use of insulin ([place])   [facility], Devonne Doughty, DO   1 year ago Type 2 diabetes mellitus with other specified complication, with long-term current use of insulin [facility])   [facility] [name], DO      Future Appointments            In 9 months Ralene Bathe, MD [place] - Lipid Panel completed within the last 12 months    Cholesterol  Date Value Ref Range Status  11/27/2021 101 <200 mg/dL Final   LDL Cholesterol (Calc)  Date Value Ref Range Status  11/27/2021 43 mg/dL (calc) Final    Comment:    Reference range: <100 . Desirable range <100 mg/dL for primary prevention;   <70 mg/dL for patients with CHD or diabetic patients  with > or = 2 CHD risk factors. Marland Kitchen LDL-C is now calculated using the Martin-Hopkins  calculation, which is a validated novel method providing  better accuracy than the Friedewald equation in the  estimation of LDL-C.  Cresenciano Genre et al. Annamaria Helling. 6314;970(26): 2061-2068  (http://education.Quest[employer]ostics.com/faq/FAQ164)    HDL  Date Value Ref Range Status  11/27/2021 41 > OR = 40 mg/dL Final    Triglycerides  Date Value Ref Range Status  11/27/2021 88 <150 mg/dL Final

## 2022-05-17 NOTE — Telephone Encounter (Signed)
Requested medications are due for refill today.  no  Requested medications are on the active medications list.  yes  Last refill. 05/14/2022 71m 2 refills  Future visit scheduled.   no  Notes to clinic.  Note from pharmacy:   Pharmacy comment: Alternative Requested:NOT COVERED BY INSURANCE.   All Pharmacy Suggested Alternatives:   Exenatide ER (BYDUREON BCISE) 2 MG/0.85ML AUIJ liraglutide (VICTOZA) 18 MG/3ML SOPN Dulaglutide (TRULICITY) 08.46MNG/2.9BMSOPN Semaglutide,0.25 or 0.5MG/DOS, (OZEMPIC, 0.25 OR 0.5 MG/DOSE,) 2 MG/3ML SOPN     Requested Prescriptions  Pending Prescriptions Disp Refills   BYDUREON BCISE 2 MG/0.85ML AUIJ [Pharmacy Med Name: BYDUREON BCISE 2 MG AUTOINJECT]  0    Sig: Inject 5 mg into the skin once a week.     Endocrinology:  Diabetes - GLP-1 Receptor Agonists - exenatide Passed - 05/14/2022  6:45 PM      Passed - HBA1C is between 0 and 7.9 and within 180 days    Hgb A1c MFr Bld  Date Value Ref Range Status  05/10/2022 5.7 (H) <5.7 % of total Hgb Final    Comment:    For someone without known diabetes, a hemoglobin  A1c value between 5.7% and 6.4% is consistent with prediabetes and should be confirmed with a  follow-up test. . For someone with known diabetes, a value <7% indicates that their diabetes is well controlled. A1c targets should be individualized based on duration of diabetes, age, comorbid conditions, and other considerations. . This assay result is consistent with an increased risk of diabetes. . Currently, no consensus exists regarding use of hemoglobin A1c for diagnosis of diabetes for children. .          Passed - Cr in normal range and within 360 days    Creat  Date Value Ref Range Status  11/27/2021 0.75 0.70 - 1.28 mg/dL Final         Passed - eGFR in normal range and within 360 days    GFR, Est African American  Date Value Ref Range Status  08/12/2020 112 > OR = 60 mL/min/1.765mFinal   GFR, Est Non African American   Date Value Ref Range Status  08/12/2020 96 > OR = 60 mL/min/1.7343minal   GFR, Estimated  Date Value Ref Range Status  09/01/2021 >60 >60 mL/min Final    Comment:    (NOTE) Calculated using the CKD-EPI Creatinine Equation (2021)    eGFR  Date Value Ref Range Status  11/27/2021 97 > OR = 60 mL/min/1.34m56mnal    Comment:    The eGFR is based on the CKD-EPI 2021 equation. To calculate  the new eGFR from a previous Creatinine or Cystatin C result, go to https://www.kidney.org/professionals/ kdoqi/gfr%5Fcalculator          Passed - Valid encounter within last 6 months    Recent Outpatient Visits           1 week ago [facility]otrophic lateral [facility]   5 months ago [facility]otrophic lateral [facility]) [facility]   9 months ago COVID-19 virus infection   [facility]   11 months ago Type 2 diabetes mellitus with other specified complication, with long-term current use of insulin ([facility]   1 year ago Type 2 diabetes mellitus with other specified complication, with long-term  current use of insulin [facility])   [place], DO       Future Appointments             In 9 months Ralene Bathe, MD [place]

## 2022-05-17 NOTE — Addendum Note (Signed)
Addended by: Jearld Fenton on: 05/17/2022 08:22 AM   Modules accepted: Orders

## 2022-05-17 NOTE — Telephone Encounter (Signed)
Requested medication (s) are due for refill today: Yes  Requested medication (s) are on the active medication list: Yes  Last refill:  02/16/22  Future visit scheduled: No  Notes to clinic:  Unable to refill per protocol due to failed labs, no updated results of vitamin B12, routing to provider for approval     Requested Prescriptions  Pending Prescriptions Disp Refills   cyanocobalamin (,VITAMIN B-12,) 1000 MCG/ML injection [Pharmacy Med Name: CYANOCOBALAMIN 1,000 MCG/ML VL] 3 mL 1    Sig: INJECT 1 ML (1,000 MCG TOTAL) INTO THE MUSCLE EVERY 30 DAYS.     Endocrinology:  Vitamins - Vitamin B12 Failed - 05/16/2022  9:17 AM      Failed - B12 Level in normal range and within 360 days    No results found for: "VITAMINB12"       Passed - HCT in normal range and within 360 days    HCT  Date Value Ref Range Status  11/27/2021 45.5 38.5 - 50.0 % Final         Passed - HGB in normal range and within 360 days    Hemoglobin  Date Value Ref Range Status  11/27/2021 15.1 13.2 - 17.1 g/dL Final         Passed - Valid encounter within last 12 months    Recent Outpatient Visits           1 week ago ALS (amyotrophic lateral sclerosis) ([place])   [facility] [name], DO   5 months ago ALS (amyotrophic lateral sclerosis) [facility])   [facility] [name], DO   9 months ago COVID-19 virus infection   [facility], Devonne Doughty, DO   11 months ago Type 2 diabetes mellitus with other specified complication, with long-term current use of insulin [facility])   [facility] [place], Devonne Doughty, DO   1 year ago Type 2 diabetes mellitus with other specified complication, with long-term current use of insulin [facility])   [facility] [name], DO       Future Appointments             In 9 months Ralene Bathe, MD [place]

## 2022-05-28 MED ORDER — TRULICITY 0.75 MG/0.5ML ~~LOC~~ SOAJ: 0.7500 mg | SUBCUTANEOUS | 0 refills | Status: DC

## 2022-05-28 NOTE — Addendum Note (Signed)
Addended by: [name] on: 05/28/2022 02:18 PM   Modules accepted: Orders

## 2022-06-24 ENCOUNTER — Other Ambulatory Visit: Payer: Self-pay | Admitting: Urology

## 2022-06-24 MED ORDER — TAMSULOSIN HCL 0.4 MG PO CAPS: 0.8000 mg | ORAL_CAPSULE | Freq: Every day | ORAL | 3 refills | Status: DC

## 2022-06-24 MED ORDER — OZEMPIC (1 MG/DOSE) 4 MG/3ML ~~LOC~~ SOPN: 1.0000 mg | PEN_INJECTOR | SUBCUTANEOUS | 1 refills | Status: DC

## 2022-06-24 NOTE — Addendum Note (Signed)
Addended by: [name] on: 06/24/2022 01:42 PM   Modules accepted: Orders

## 2022-08-18 ENCOUNTER — Other Ambulatory Visit: Payer: Self-pay | Admitting: Family Medicine

## 2022-08-18 DIAGNOSIS — E1169 Type 2 diabetes mellitus with other specified complication: Secondary | ICD-10-CM

## 2022-08-18 NOTE — Telephone Encounter (Signed)
last RF 06/24/22 Change in therapy Dr Parks Ranger  Requested Prescriptions  Refused Prescriptions Disp Refills  . TRULICITY 6.57 QI/6.9GE SOPN [Pharmacy Med Name: TRULICITY 9.52 WU/1.3 ML PEN]      Sig: INJECT 0.75 MG SUBCUTANEOUSLY ONE TIME PER WEEK     Endocrinology:  Diabetes - GLP-1 Receptor Agonists Passed - 08/18/2022  2:30 AM      Passed - HBA1C is between 0 and 7.9 and within 180 days    Hgb A1c MFr Bld  Date Value Ref Range Status  05/10/2022 5.7 (H) <5.7 % of total Hgb Final    Comment:    For someone without known diabetes, a hemoglobin  A1c value between 5.7% and 6.4% is consistent with prediabetes and should be confirmed with a  follow-up test. . For someone with known diabetes, a value <7% indicates that their diabetes is well controlled. A1c targets should be individualized based on duration of diabetes, age, comorbid conditions, and other considerations. . This assay result is consistent with an increased risk of diabetes. . Currently, no consensus exists regarding use of hemoglobin A1c for diagnosis of diabetes for children. Renella Cunas - Valid encounter within last 6 months    Recent Outpatient Visits          3 months ago ALS (amyotrophic lateral sclerosis) ([place])   [facility] [name], DO   8 months ago ALS (amyotrophic lateral sclerosis) [facility])   [facility] [name], DO   1 year ago COVID-19 virus infection   [place], Devonne Doughty, DO   1 year ago Type 2 diabetes mellitus with other specified complication, with long-term current use of insulin [facility])   [facility] [name], DO   1 year ago Type 2 diabetes mellitus with other specified complication, with long-term current use of insulin [facility])   [facility] [name], DO      Future Appointments            In 6 months Ralene Bathe, MD  [place]

## 2022-08-23 ENCOUNTER — Encounter: Payer: Self-pay | Admitting: Family Medicine

## 2022-08-24 ENCOUNTER — Other Ambulatory Visit: Payer: Self-pay

## 2022-08-24 DIAGNOSIS — G720 Drug-induced myopathy: Secondary | ICD-10-CM

## 2022-08-24 DIAGNOSIS — I6930 Unspecified sequelae of cerebral infarction: Secondary | ICD-10-CM

## 2022-08-24 DIAGNOSIS — E1169 Type 2 diabetes mellitus with other specified complication: Secondary | ICD-10-CM

## 2022-08-24 MED ORDER — PRALUENT 75 MG/ML ~~LOC~~ SOAJ: 75.0000 mg | SUBCUTANEOUS | 3 refills | Status: DC

## 2022-08-26 ENCOUNTER — Telehealth: Payer: Self-pay | Admitting: Family Medicine

## 2022-08-26 NOTE — Telephone Encounter (Signed)
[place] w/ [employer] needs to address the the PA questions for the pt's PRAL[address] [place]

## 2022-08-26 NOTE — Telephone Encounter (Signed)
Spoke with [facility] [employer] and the medication has been approved.  # [id number]

## 2022-08-26 NOTE — Telephone Encounter (Signed)
Left a message for the patient letting him know that the medication has been approved and can be picked [facility] at [facility] [place].

## 2022-09-13 ENCOUNTER — Encounter (INDEPENDENT_AMBULATORY_CARE_PROVIDER_SITE_OTHER): Payer: Self-pay

## 2022-09-27 ENCOUNTER — Encounter: Payer: Self-pay | Admitting: Family Medicine

## 2022-09-29 ENCOUNTER — Other Ambulatory Visit: Payer: Self-pay

## 2022-09-29 DIAGNOSIS — E1169 Type 2 diabetes mellitus with other specified complication: Secondary | ICD-10-CM

## 2022-09-29 MED ORDER — OZEMPIC (1 MG/DOSE) 4 MG/3ML ~~LOC~~ SOPN: 1.0000 mg | PEN_INJECTOR | SUBCUTANEOUS | 1 refills | Status: DC

## 2022-10-30 ENCOUNTER — Other Ambulatory Visit: Payer: Self-pay | Admitting: Family Medicine

## 2022-10-30 DIAGNOSIS — E538 Deficiency of other specified B group vitamins: Secondary | ICD-10-CM

## 2022-11-01 NOTE — Telephone Encounter (Signed)
Requested medication (s) are due for refill today: yes  Requested medication (s) are on the active medication list: yes  Last refill:  05/19/22 #58m/1  Future visit scheduled: no  Notes to clinic:  Unable to refill per protocol due to failed labs, no updated results.      Requested Prescriptions  Pending Prescriptions Disp Refills   cyanocobalamin (VITAMIN B12) 1000 MCG/ML injection [Pharmacy Med Name: CYANOCOBALAMIN 1,000 MCG/ML VL] 3 mL 1    Sig: INJECT 1 ML (1,000 MCG) INTRAMUSCULARLY EVERY 30 DAYS     Endocrinology:  Vitamins - Vitamin B12 Failed - 10/30/2022  8:00 AM      Failed - B12 Level in normal range and within 360 days    No results found for: "VITAMINB12"       Passed - HCT in normal range and within 360 days    HCT  Date Value Ref Range Status  11/27/2021 45.5 38.5 - 50.0 % Final         Passed - HGB in normal range and within 360 days    Hemoglobin  Date Value Ref Range Status  11/27/2021 15.1 13.2 - 17.1 g/dL Final         Passed - Valid encounter within last 12 months    Recent Outpatient Visits           5 months ago [place]amyotrophic lateral [place]) ([place]   [facility]   11 months ago [facility] (amyotrophic lateral sclerosis) ([facility]   [facility]   1 year ago COVID-19 virus infection   [place]   1 year ago Type 2 diabetes mellitus with other specified complication, with long-term current use of insulin ([facility]   [facility]   1 year ago Type 2 diabetes mellitus with other specified complication, with long-term current use of insulin ([facility]   [facility]       Future Appointments             In 4 months KRalene Bathe MD [place]

## 2022-11-09 ENCOUNTER — Other Ambulatory Visit: Payer: Self-pay | Admitting: Family Medicine

## 2022-11-09 DIAGNOSIS — E1169 Type 2 diabetes mellitus with other specified complication: Secondary | ICD-10-CM

## 2022-11-10 ENCOUNTER — Other Ambulatory Visit: Payer: Self-pay

## 2022-11-11 NOTE — Telephone Encounter (Signed)
Requested Prescriptions  Pending Prescriptions Disp Refills   JARDIANCE 25 MG TABS tablet [Pharmacy Med Name: JARDIANCE 25 MG TABLET] 90 tablet 0    Sig: TAKE 1 TABLET BY MOUTH EVERY DAY     Endocrinology:  Diabetes - SGLT2 Inhibitors Failed - 11/09/2022  2:37 PM      Failed - HBA1C is between 0 and 7.9 and within 180 days    Hgb A1c MFr Bld  Date Value Ref Range Status  05/10/2022 5.7 (H) <5.7 % of total Hgb Final    Comment:    For someone without known diabetes, a hemoglobin  A1c value between 5.7% and 6.4% is consistent with prediabetes and should be confirmed with a  follow-up test. . For someone with known diabetes, a value <7% indicates that their diabetes is well controlled. A1c targets should be individualized based on duration of diabetes, age, comorbid conditions, and other considerations. . This assay result is consistent with an increased risk of diabetes. . Currently, no consensus exists regarding use of hemoglobin A1c for diagnosis of diabetes for children. .          Failed - Valid encounter within last 6 months    Recent Outpatient Visits           6 months ago ALS (amyotrophic lateral sclerosis) ([place])   [facility] [name], DO   11 months ago ALS (amyotrophic lateral sclerosis) [facility])   [facility] [name], DO   1 year ago COVID-19 virus infection   [place], Devonne Doughty, DO   1 year ago Type 2 diabetes mellitus with other specified complication, with long-term current use of insulin ([place])   [name]   1 year ago Type 2 diabetes mellitus with other specified complication, with long-term current use of insulin ([place])   [facility] [name], DO       Future Appointments             In 3 months Ralene Bathe, MD [place] in normal  range and within 360 days    Creat  Date Value Ref Range Status  11/27/2021 0.75 0.70 - 1.28 mg/dL Final         Passed - eGFR in normal range and within 360 days    GFR, Est African American  Date Value Ref Range Status  08/12/2020 112 > OR = 60 mL/min/1.52m Final   GFR, Est Non African American  Date Value Ref Range Status  08/12/2020 96 > OR = 60 mL/min/1.76mFinal   GFR, Estimated  Date Value Ref Range Status  09/01/2021 >60 >60 mL/min Final    Comment:    (NOTE) Calculated using the CKD-EPI Creatinine Equation (2021)    eGFR  Date Value Ref Range Status  11/27/2021 97 > OR = 60 mL/min/1.7330minal    Comment:    The eGFR is based on the CKD-EPI 2021 equation. To calculate  the new eGFR from a previous Creatinine or Cystatin C result, go to https://www.kidney.org/professionals/ kdoqi/gfr%5Fcalculator

## 2022-11-18 ENCOUNTER — Other Ambulatory Visit: Payer: Self-pay | Admitting: Family Medicine

## 2022-11-18 DIAGNOSIS — N401 Enlarged prostate with lower urinary tract symptoms: Secondary | ICD-10-CM

## 2022-11-18 NOTE — Telephone Encounter (Signed)
Requested Prescriptions  Pending Prescriptions Disp Refills   finasteride (PROSCAR) 5 MG tablet [Pharmacy Med Name: FINASTERIDE 5 MG TABLET] 90 tablet 0    Sig: TAKE 1 TABLET (5 MG TOTAL) BY MOUTH DAILY.     Urology: 5-alpha Reductase Inhibitors Passed - 11/18/2022 10:51 AM      Passed - PSA in normal range and within 360 days    PSA  Date Value Ref Range Status  11/27/2021 0.26 < OR = 4.00 ng/mL Final    Comment:    The total PSA value from this assay system is  standardized against the WHO standard. The test  result will be approximately 20% lower when compared  to the equimolar-standardized total PSA (Beckman  Coulter). Comparison of serial PSA results should be  interpreted with this fact in mind. . This test was performed using the Siemens  chemiluminescent method. Values obtained from  different assay methods cannot be used interchangeably. PSA levels, regardless of value, should not be interpreted as absolute evidence of the presence or absence of disease.          Passed - Valid encounter within last 12 months    Recent Outpatient Visits           6 months ago ALS (amyotrophic lateral sclerosis) ([place])   [facility] [name], DO   11 months ago ALS (amyotrophic lateral sclerosis) [facility])   [name]   1 year ago COVID-19 virus infection   [place], Devonne Doughty, DO   1 year ago Type 2 diabetes mellitus with other specified complication, with long-term current use of insulin [facility])   [facility] [name], DO   1 year ago Type 2 diabetes mellitus with other specified complication, with long-term current use of insulin [facility])   [facility] [name], DO       Future Appointments             In 3 months Ralene Bathe, MD [place]

## 2022-11-21 ENCOUNTER — Other Ambulatory Visit: Payer: Self-pay | Admitting: Family Medicine

## 2022-11-21 DIAGNOSIS — I10 Essential (primary) hypertension: Secondary | ICD-10-CM

## 2022-11-22 NOTE — Telephone Encounter (Signed)
Patient needs OV, will refill for 30 days until OV can be made. OV needed for additional refills.  Requested Prescriptions  Pending Prescriptions Disp Refills   valsartan (DIOVAN) 80 MG tablet [Pharmacy Med Name: VALSARTAN 80 MG TABLET] 30 tablet 0    Sig: TAKE 1 TABLET BY MOUTH EVERY DAY     Cardiovascular:  Angiotensin Receptor Blockers Failed - 11/21/2022  8:41 AM      Failed - Cr in normal range and within 180 days    Creat  Date Value Ref Range Status  11/27/2021 0.75 0.70 - 1.28 mg/dL Final         Failed - K in normal range and within 180 days    Potassium  Date Value Ref Range Status  11/27/2021 4.1 3.5 - 5.3 mmol/L Final         Failed - Valid encounter within last 6 months    Recent Outpatient Visits           6 months ago ALS (amyotrophic lateral sclerosis) ([place])   [facility] [name], DO   11 months ago ALS (amyotrophic lateral sclerosis) [facility])   [facility] [name], DO   1 year ago COVID-19 virus infection   [place], Devonne Doughty, DO   1 year ago Type 2 diabetes mellitus with other specified complication, with long-term current use of insulin [facility])   [facility] [place], Devonne Doughty, DO   1 year ago Type 2 diabetes mellitus with other specified complication, with long-term current use of insulin [facility])   [place], DO       Future Appointments             In 3 months Ralene Bathe, MD [place] - Patient is not pregnant      Passed - Last BP in normal range    BP Readings from Last 1 Encounters:  05/10/22 137/71

## 2022-11-25 ENCOUNTER — Other Ambulatory Visit: Payer: Self-pay | Admitting: Family Medicine

## 2022-11-25 DIAGNOSIS — E1169 Type 2 diabetes mellitus with other specified complication: Secondary | ICD-10-CM

## 2022-11-25 DIAGNOSIS — Z794 Long term (current) use of insulin: Secondary | ICD-10-CM

## 2022-11-26 NOTE — Telephone Encounter (Signed)
Last RF 11/11/22 #90 too soon  Requested Prescriptions  Refused Prescriptions Disp Refills   JARDIANCE 25 MG TABS tablet [Pharmacy Med Name: JARDIANCE 25 MG TABLET] 90 tablet 0    Sig: TAKE 1 TABLET BY [place]     Endocrinology:  Diabetes - SGLT2 Inhibitors Failed - 11/25/2022  7:08 PM      Failed - Cr in normal range and within 360 days    Creat  Date Value Ref Range Status  11/27/2021 0.75 0.70 - 1.28 mg/dL Final         Failed - HBA1C is between 0 and 7.9 and within 180 days    Hgb A1c MFr Bld  Date Value Ref Range Status  05/10/2022 5.7 (H) <5.7 % of total Hgb Final    Comment:    For someone without known diabetes, a hemoglobin  A1c value between 5.7% and 6.4% is consistent with prediabetes and should be confirmed with a  follow-up test. . For someone with known diabetes, a value <7% indicates that their diabetes is well controlled. A1c targets should be individualized based on duration of diabetes, age, comorbid conditions, and other considerations. . This assay result is consistent with an increased risk of diabetes. . Currently, no consensus exists regarding use of hemoglobin A1c for diagnosis of diabetes for children. .          Failed - eGFR in normal range and within 360 days    GFR, Est African American  Date Value Ref Range Status  08/12/2020 112 > OR = 60 mL/min/1.23m Final   GFR, Est Non African American  Date Value Ref Range Status  08/12/2020 96 > OR = 60 mL/min/1.764mFinal   GFR, Estimated  Date Value Ref Range Status  09/01/2021 >60 >60 mL/min Final    Comment:    (NOTE) Calculated using the CKD-EPI Creatinine Equation (2021)    eGFR  Date Value Ref Range Status  11/27/2021 97 > OR = 60 mL/min/1.7364minal    Comment:    The eGFR is based on the CKD-EPI 2021 equation. To calculate  the new eGFR from a previous Creatinine or Cystatin C result, go to https://www.kidney.org/professionals/ kdoqi/gfr%5Fcalculator           Failed - Valid encounter within last 6 months    Recent Outpatient Visits           6 months ago [facility] (amyotrophic lateral sclerosis) ([facility]   11 months ago [facility] (amyotrophic lateral sclerosis) ([facility]   1 year ago COVID-19 virus infection   SouFurmanleDevonne DoughtyO   1 year ago Type 2 diabetes mellitus with other specified complication, with long-term current use of insulin ([facility]   1 year ago Type 2 diabetes mellitus with other specified complication, with long-term current use of insulin ([facility]O       Future Appointments             In 3 months KowRalene BatheD [place]

## 2022-11-29 ENCOUNTER — Encounter: Payer: Self-pay | Admitting: Family Medicine

## 2022-12-09 ENCOUNTER — Other Ambulatory Visit: Payer: Self-pay | Admitting: Family Medicine

## 2022-12-09 DIAGNOSIS — Z794 Long term (current) use of insulin: Secondary | ICD-10-CM

## 2022-12-09 NOTE — Telephone Encounter (Signed)
Requested medication (s) are due for refill today: expired medication  Requested medication (s) are on the active medication list: yes  Last refill:  10/28/21 #4m 3 refills  Future visit scheduled: no  Notes to clinic:  expired medication. Do you want to renew Rx?     Requested Prescriptions  Pending Prescriptions Disp Refills   NOVOLIN N 100 UNIT/ML injection [Pharmacy Med Name: NOVOLIN N 100 UNIT/ML VIAL] 40 mL 3    Sig: INJECT 40 UNITS (0.4 ML) INTO THE SKIN AT BEDTIME. MAY USE REDUCED DOSE AS ADVISED.(NOT COVERED)     Endocrinology:  Diabetes - Insulins Failed - 12/09/2022  1:31 AM      Failed - HBA1C is between 0 and 7.9 and within 180 days    Hgb A1c MFr Bld  Date Value Ref Range Status  05/10/2022 5.7 (H) <5.7 % of total Hgb Final    Comment:    For someone without known diabetes, a hemoglobin  A1c value between 5.7% and 6.4% is consistent with prediabetes and should be confirmed with a  follow-up test. . For someone with known diabetes, a value <7% indicates that their diabetes is well controlled. A1c targets should be individualized based on duration of diabetes, age, comorbid conditions, and other considerations. . This assay result is consistent with an increased risk of diabetes. . Currently, no consensus exists regarding use of hemoglobin A1c for diagnosis of diabetes for children. .          Failed - Valid encounter within last 6 months    Recent Outpatient Visits           7 months ago ALS (amyotrophic lateral sclerosis) ([facility]   [facility]   1 year ago ALS (amyotrophic lateral sclerosis) ([facility]   [facility]   1 year ago COVID-19 virus infection   [facility]   1 year ago Type 2 diabetes mellitus with other specified complication, with long-term current use of insulin ([facility]    [place] DO   1 year ago Type 2 diabetes mellitus with other specified complication, with long-term current use of insulin ([facility]   CM[place] DO       Future Appointments             In 3 months KRalene Bathe MD [place]

## 2022-12-11 ENCOUNTER — Other Ambulatory Visit: Payer: Self-pay | Admitting: Family Medicine

## 2022-12-11 DIAGNOSIS — E1169 Type 2 diabetes mellitus with other specified complication: Secondary | ICD-10-CM

## 2022-12-13 NOTE — Telephone Encounter (Signed)
Unable to refill per protocol, Rx request is too soon. Last refill 11/11/22 for 90 days. Patient needs OV for additional refills.  Requested Prescriptions  Pending Prescriptions Disp Refills   JARDIANCE 25 MG TABS tablet [Pharmacy Med Name: JARDIANCE 25 MG TABLET] 90 tablet 0    Sig: TAKE 1 TABLET BY MOUTH EVERY DAY     Endocrinology:  Diabetes - SGLT2 Inhibitors Failed - 12/11/2022  8:05 PM      Failed - Cr in normal range and within 360 days    Creat  Date Value Ref Range Status  11/27/2021 0.75 0.70 - 1.28 mg/dL Final         Failed - HBA1C is between 0 and 7.9 and within 180 days    Hgb A1c MFr Bld  Date Value Ref Range Status  05/10/2022 5.7 (H) <5.7 % of total Hgb Final    Comment:    For someone without known diabetes, a hemoglobin  A1c value between 5.7% and 6.4% is consistent with prediabetes and should be confirmed with a  follow-up test. . For someone with known diabetes, a value <7% indicates that their diabetes is well controlled. A1c targets should be individualized based on duration of diabetes, age, comorbid conditions, and other considerations. . This assay result is consistent with an increased risk of diabetes. . Currently, no consensus exists regarding use of hemoglobin A1c for diagnosis of diabetes for children. .          Failed - eGFR in normal range and within 360 days    GFR, Est African American  Date Value Ref Range Status  08/12/2020 112 > OR = 60 mL/min/1.67m Final   GFR, Est Non African American  Date Value Ref Range Status  08/12/2020 96 > OR = 60 mL/min/1.762mFinal   GFR, Estimated  Date Value Ref Range Status  09/01/2021 >60 >60 mL/min Final    Comment:    (NOTE) Calculated using the CKD-EPI Creatinine Equation (2021)    eGFR  Date Value Ref Range Status  11/27/2021 97 > OR = 60 mL/min/1.7339minal    Comment:    The eGFR is based on the CKD-EPI 2021 equation. To calculate  the new eGFR from a previous Creatinine or  Cystatin C result, go to https://www.kidney.org/professionals/ kdoqi/gfr%5Fcalculator          Failed - Valid encounter within last 6 months    Recent Outpatient Visits           7 months ago ALS (amyotrophic lateral sclerosis) ([facility] Con[facility] Cent[facility] [place]leDevonne DoughtyO   1 year ago ALS (amyotrophic lateral sclerosis) ([facility] ConNorthfield [facility] CenterrOlin HauserO   1 year ago COVID-19 virus infection   [facility]Der[facility] CenterrClaytonleDevonne DoughtyO   1 year ago Type 2 diabetes mellitus with other specified complication, with long-term current use of insulin ([facility] ConMattydaleO   1 year ago Type 2 diabetes mellitus with other specified complication, with long-term current use of insulin ([facility] ConHilliardO       Future Appointments             In 2 months KowRalene BatheD ConDaw[place]

## 2022-12-18 ENCOUNTER — Other Ambulatory Visit: Payer: Self-pay | Admitting: Family Medicine

## 2022-12-18 DIAGNOSIS — I10 Essential (primary) hypertension: Secondary | ICD-10-CM

## 2022-12-20 NOTE — Telephone Encounter (Signed)
Requested medication (s) are due for refill today: yes  Requested medication (s) are on the active medication list: yes  Last refill:  11/22/22  Future visit scheduled: yes  Notes to clinic:  [place]. DX Code Needed.      Requested Prescriptions  Pending Prescriptions Disp Refills   valsartan (DIOVAN) 80 MG tablet [Pharmacy Med Name: VALSARTAN 80 MG TABLET] 90 tablet 1    Sig: TAKE 1 TABLET BY MOUTH EVERY DAY     Cardiovascular:  Angiotensin Receptor Blockers Failed - 12/18/2022 12:35 PM      Failed - Cr in normal range and within 180 days    Creat  Date Value Ref Range Status  11/27/2021 0.75 0.70 - 1.28 mg/dL Final         Failed - K in normal range and within 180 days    Potassium  Date Value Ref Range Status  11/27/2021 4.1 3.5 - 5.3 mmol/L Final         Failed - Valid encounter within last 6 months    Recent Outpatient Visits           7 months ago ALS (amyotrophic lateral sclerosis) [facility])   [facility] [name], DO   1 year ago ALS (amyotrophic lateral sclerosis) [facility])   [facility] [name], DO   1 year ago COVID-19 virus infection   [place], DO   1 year ago Type 2 diabetes mellitus with other specified complication, with long-term current use of insulin [facility])   [place], DO   1 year ago Type 2 diabetes mellitus with other specified complication, with long-term current use of insulin [facility])   [place], DO       Future Appointments             In 2 months Ralene Bathe, MD [place] - Patient is not pregnant      Passed - Last BP in normal range    BP Readings from Last 1 Encounters:  05/10/22 137/71

## 2022-12-27 ENCOUNTER — Other Ambulatory Visit: Payer: Self-pay | Admitting: Family Medicine

## 2022-12-27 DIAGNOSIS — Z794 Long term (current) use of insulin: Secondary | ICD-10-CM

## 2022-12-27 MED ORDER — EMPAGLIFLOZIN 25 MG PO TABS: 25.0000 mg | ORAL_TABLET | Freq: Every day | ORAL | 0 refills | Status: DC

## 2023-01-12 ENCOUNTER — Other Ambulatory Visit: Payer: Self-pay | Admitting: Family Medicine

## 2023-01-12 DIAGNOSIS — E1169 Type 2 diabetes mellitus with other specified complication: Secondary | ICD-10-CM

## 2023-01-12 NOTE — Telephone Encounter (Signed)
Called patient to schedule appt for medication refills. Appt scheduled 01/14/23.

## 2023-01-12 NOTE — Telephone Encounter (Signed)
Requested by interface surescripts. Last refill doc 12/17/22. Requesting refill too soon. Appt scheduled for 01/14/23. Requested Prescriptions  Refused Prescriptions Disp Refills   OZEMPIC, 1 MG/DOSE, 4 MG/3ML SOPN [Pharmacy Med Name: OZEMPIC 4 MG/3 ML (1 MG/DOSE)]  1    Sig: INJECT '1MG'$  INTO THE SKIN ONCE A WEEK     Endocrinology:  Diabetes - GLP-1 Receptor Agonists - semaglutide Failed - 01/12/2023 10:01 AM      Failed - HBA1C in normal range and within 180 days    Hgb A1c MFr Bld  Date Value Ref Range Status  05/10/2022 5.7 (H) <5.7 % of total Hgb Final    Comment:    For someone without known diabetes, a hemoglobin  A1c value between 5.7% and 6.4% is consistent with prediabetes and should be confirmed with a  follow-up test. . For someone with known diabetes, a value <7% indicates that their diabetes is well controlled. A1c targets should be individualized based on duration of diabetes, age, comorbid conditions, and other considerations. . This assay result is consistent with an increased risk of diabetes. . Currently, no consensus exists regarding use of hemoglobin A1c for diagnosis of diabetes for children. .          Failed - Cr in normal range and within 360 days    Creat  Date Value Ref Range Status  11/27/2021 0.75 0.70 - 1.28 mg/dL Final         Failed - Valid encounter within last 6 months    Recent Outpatient Visits           8 months ago ALS (amyotrophic lateral sclerosis) [facility])   [facility] [name], DO   1 year ago ALS (amyotrophic lateral sclerosis) [facility])   [name] DO   1 year ago COVID-19 virus infection   [facility] [place], Devonne Doughty, DO   1 year ago Type 2 diabetes mellitus with other specified complication, with long-term current use of insulin [facility])   [place], DO   1 year ago Type 2 diabetes mellitus with other specified complication, with long-term current use of insulin [facility])   [facility] [place], Devonne Doughty, DO       Future Appointments             In 2 days Parks Ranger, Devonne Doughty, DO [facility] [place]   In 1 month Ralene Bathe, MD [place]

## 2023-01-14 ENCOUNTER — Encounter: Payer: Self-pay | Admitting: Family Medicine

## 2023-01-14 ENCOUNTER — Ambulatory Visit: Payer: Medicare HMO | Admitting: Family Medicine

## 2023-01-14 ENCOUNTER — Other Ambulatory Visit: Payer: Self-pay | Admitting: Family Medicine

## 2023-01-14 VITALS — BP 126/60 | HR 82 | Temp 96.9°F | Wt 234.0 lb

## 2023-01-14 DIAGNOSIS — J3800 Paralysis of vocal cords and larynx, unspecified: Secondary | ICD-10-CM

## 2023-01-14 DIAGNOSIS — I6930 Unspecified sequelae of cerebral infarction: Secondary | ICD-10-CM | POA: Diagnosis not present

## 2023-01-14 DIAGNOSIS — Z794 Long term (current) use of insulin: Secondary | ICD-10-CM | POA: Diagnosis not present

## 2023-01-14 DIAGNOSIS — N401 Enlarged prostate with lower urinary tract symptoms: Secondary | ICD-10-CM

## 2023-01-14 DIAGNOSIS — G720 Drug-induced myopathy: Secondary | ICD-10-CM

## 2023-01-14 DIAGNOSIS — E1169 Type 2 diabetes mellitus with other specified complication: Secondary | ICD-10-CM

## 2023-01-14 DIAGNOSIS — I10 Essential (primary) hypertension: Secondary | ICD-10-CM

## 2023-01-14 DIAGNOSIS — Z0000 Encounter for general adult medical examination without abnormal findings: Secondary | ICD-10-CM

## 2023-01-14 DIAGNOSIS — E785 Hyperlipidemia, unspecified: Secondary | ICD-10-CM

## 2023-01-14 DIAGNOSIS — G1221 Amyotrophic lateral sclerosis: Secondary | ICD-10-CM

## 2023-01-14 DIAGNOSIS — J329 Chronic sinusitis, unspecified: Secondary | ICD-10-CM

## 2023-01-14 LAB — POCT GLYCOSYLATED HEMOGLOBIN (HGB A1C): Hemoglobin A1C: 7.1 % — AB (ref 4.0–5.6)

## 2023-01-14 MED ORDER — AZELASTINE HCL 0.1 % NA SOLN: 2.0000 | Freq: Two times a day (BID) | NASAL | 12 refills | Status: DC

## 2023-01-14 NOTE — Patient Instructions (Addendum)
Thank you for coming to the office today.  Recent Labs    05/10/22 1400 01/14/23 1349  HGBA1C 5.7* 7.1*    Keep filling Praluent through 03/10/23  Check with insurance to confirm that the [place] will be the preferred formulary option starting after 03/10/23  If you find out that the [place] is not preferred either - then let me know even sooner and I can start the appeal for the exception  Notify me ASAP at or after that date and we can initiate new order for the [place].  Referral to Dr Clyde Canterbury -  ENT for further evaluation of vocal cords and sinusitis  [facility] [address] [place], [place] [address] Phone: [phone]  [facility] [address]  [place], [place] [address] Ph: [phone]  -------------------------  Try the Azelastine spray 2 spray each  nostril twice a day to reduce drainage May need to use saline mist as well. Or vaseline  DUE for FASTING BLOOD WORK (no food or drink after midnight before the lab appointment, only water or coffee without cream/sugar on the morning of)  SCHEDULE "Lab Only" visit in the morning at the clinic for lab draw in 3 MONTHS   - Make sure Lab Only appointment is at about 1 week before your next appointment, so that results will be available  For Lab Results, once available within 2-3 days of blood draw, you can can log in to MyChart online to view your results and a brief explanation. Also, we can discuss results at next follow-up visit.    Please schedule a Follow-up Appointment to: Return in about 16 weeks (around 05/06/2023) for return 3-4 months fasting lab then 1 week later Annual Physical can keep same day as AMW 6/21.  If you have any other questions or concerns, please feel free to call the office or send a message through [place]. You may also schedule an earlier appointment if necessary.  Additionally, you may be receiving a survey about your  experience at our office within a few days to 1 week by e-mail or mail. We value your feedback.  Nobie Putnam, DO [place]

## 2023-01-14 NOTE — Assessment & Plan Note (Signed)
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 Continue current meds - Praluent q 14 days  - Keep filling Praluent through 03/10/23 - Check with insurance to confirm that the [place] will be the preferred formulary option starting after 03/10/23 - If you find out that the [place] is not preferred either - then let me know even sooner and I can start the appeal for the exception Notify me ASAP at or after that date and we can initiate new order for the [place].

## 2023-01-14 NOTE — Assessment & Plan Note (Addendum)
A1c at 7.1 today, slight increase from prior 6 range Complications - some early peripheral neuropathy, obesity - increases risk of future cardiovascular complications  Never started Trulicity  Plan:  1. Continue Ozempic 1 mg weekly, Jardiance, Metformin, Insulin 3 Encourage improved lifestyle - low carb, low sugar diet, reduce portion size, continue improving regular exercise 4 Check CBG, bring log to next visit for review 5. Continue ASA, ARB, OFF Statin now on PCSK9

## 2023-01-14 NOTE — Progress Notes (Signed)
Subjective:    Patient ID: Jonathan Shelton, male    DOB: [birth year], 72 y.o.   MRN: [id number]  Jonathan Shelton is a 72 y.o. 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  Hoarse Voice Vocal Cord Paralysis 2-3 months ago severe choking spell, had vocal cord spasm Vocal cord paralysis, with raspy voice ALS specialist recommended ENT consultation for vocal cord  Chronic Rhinosinusitis He has dry nose and bleeding/scabs Persistent post nasal drainage and into throat and causing some productive cough Asking about nasal sprays   ALS Followed by [facility]  Type 2 Diabetes Due for A1c Has been mostly controlled On current medications     01/14/2023    2:30 PM 04/30/2022    2:51 PM 06/02/2021    1:47 PM  Depression screen PHQ 2/9  Decreased Interest 0 0 2  Down, Depressed, Hopeless 0 0 0  PHQ - 2 Score 0 0 2  Altered sleeping 1 0 2  Tired, decreased energy 1 0 3  Change in appetite 1 0 1  Feeling bad or failure about yourself  0 0 0  Trouble concentrating 0 0 3  Moving slowly or fidgety/restless 3 0 0  Suicidal thoughts 0 0 0  PHQ-9 Score 6 0 11  Difficult doing work/chores Not difficult at all Not difficult at all Not difficult at all    Social History   Tobacco Use   Smoking status: Former    Types: Cigarettes    Quit date: 1970    Years since quitting: 54.2   Smokeless tobacco: Former    Types: Snuff  Vaping Use   Vaping Use: Never used  Substance Use Topics   Alcohol use: Not Currently   Drug use: Not Currently    Comment: past    Review of Systems Per HPI unless specifically indicated above     Objective:    BP 126/60 (BP Location: Right Arm, Patient Position: Sitting, Cuff Size: Large)   Pulse 82   Temp (!) 96.9  F (36.1 C) (Temporal)   Wt 234 lb (106.1 kg)   SpO2 98%   BMI 33.58 kg/m   Wt Readings from Last 3 Encounters:  01/14/23 234 lb (106.1 kg)  05/10/22 234 lb (106.1 kg)  04/30/22 234 lb (106.1 kg)    Physical Exam Vitals and nursing note reviewed.  Constitutional:      General: He is not in acute distress.    Appearance: He is well-developed. He is not diaphoretic.     Comments: Well-appearing, comfortable, cooperative  HENT:     Head: Normocephalic and atraumatic.     Comments: Hoarse voice Eyes:     General:        Right eye: No discharge.        Left eye: No discharge.     Conjunctiva/sclera: Conjunctivae normal.  Neck:     Thyroid: No thyromegaly.  Cardiovascular:     Rate and Rhythm: Normal rate and regular rhythm.     Pulses: Normal pulses.     Heart sounds: Normal heart sounds. No murmur heard. Pulmonary:     Effort: Pulmonary effort is normal. No respiratory distress.     Breath sounds: Normal breath sounds. No wheezing or rales.  Musculoskeletal:  Cervical back: Normal range of motion and neck supple.     Comments: Muscle stiffness and some reduced ROM  Lymphadenopathy:     Cervical: No cervical adenopathy.  Skin:    General: Skin is warm and dry.     Findings: No erythema or rash.  Neurological:     Mental Status: He is alert and oriented to person, place, and time. Mental status is at baseline.  Psychiatric:        Behavior: Behavior normal.     Comments: Well groomed, good eye contact, stable now with slowed speech, occasional word finding issue.    Results for orders placed or performed in visit on 01/14/23  POCT glycosylated hemoglobin (Hb A1C)  Result Value Ref Range   Hemoglobin A1C 7.1 (A) 4.0 - 5.6 %      Assessment & Plan:   Problem List Items Addressed This Visit     ALS (amyotrophic lateral sclerosis) ([place])   Relevant Orders   Ambulatory referral to ENT   Drug-induced myopathy   History of cerebrovascular accident (CVA) with  residual deficit   Hyperlipidemia associated with type 2 diabetes mellitus ([place])    Controlled on PCSK9 Failed Statin with myopathy Atorvastatin, Rosuvastatin   Plan: 1. Continue current meds - Praluent q 14 days  - Keep filling Praluent through 03/10/23 - Check with insurance to confirm that the [place] will be the preferred formulary option starting after 03/10/23 - If you find out that the [place] is not preferred either - then let me know even sooner and I can start the appeal for the exception Notify me ASAP at or after that date and we can initiate new order for the [place].      Type 2 diabetes mellitus with other specified complication (HCC) - Primary    A1c at 7.1 today, slight increase from prior 6 range Complications - some early peripheral neuropathy, obesity - increases risk of future cardiovascular complications  Never started Trulicity  Plan:  1. Continue Ozempic 1 mg weekly, Jardiance, Metformin, Insulin 3 Encourage improved lifestyle - low carb, low sugar diet, reduce portion size, continue improving regular exercise 4 Check CBG, bring log to next visit for review 5. Continue ASA, ARB, OFF Statin now on PCSK9      Relevant Orders   POCT glycosylated hemoglobin (Hb A1C) (Completed)   Other Visit Diagnoses     Vocal cord paralysis       Relevant Orders   Ambulatory referral to ENT   Chronic rhinosinusitis       Relevant Medications   azelastine (ASTELIN) 0.1 % nasal spray   Other Relevant Orders   Ambulatory referral to ENT         Chronic Rhinosinusitis Try the Azelastine spray 2 spray each  nostril twice a day to reduce drainage May need to use saline mist as well. Or vaseline  Vocal Cord Paralysis Hoarse Voice  referral to ENT for chronic rhinosinusitis and vocal cord paralysis hoarse voice following choking episode few month ago, he has known significant history of ALS and Neurology recommended ENT consultation.   Orders Placed This Encounter   Procedures   Ambulatory referral to ENT    Referral Priority:   Routine    Referral Type:   Consultation    Referral Reason:   Specialty Services Required    Requested Specialty:   Otolaryngology    Number of Visits Requested:   1   POCT glycosylated hemoglobin (Hb A1C)  Meds ordered this encounter  Medications   azelastine (ASTELIN) 0.1 % nasal spray    Sig: Place 2 sprays into both nostrils 2 (two) times daily. Use in each nostril as directed    Dispense:  30 mL    Refill:  12      Follow up plan: Return in about 16 weeks (around 05/06/2023) for return 3-4 months fasting lab then 1 week later Annual Physical can keep same day as AMW 6/21.  Future labs ordered for 04/2023   Nobie Putnam, [facility] 01/14/2023, 1:29 PM

## 2023-02-08 ENCOUNTER — Other Ambulatory Visit: Payer: Self-pay | Admitting: Family Medicine

## 2023-02-08 DIAGNOSIS — Z794 Long term (current) use of insulin: Secondary | ICD-10-CM

## 2023-02-08 DIAGNOSIS — N401 Enlarged prostate with lower urinary tract symptoms: Secondary | ICD-10-CM

## 2023-02-09 NOTE — Telephone Encounter (Signed)
Requested medication (s) are due for refill today:   Yes for both  Requested medication (s) are on the active medication list:   Yes for both  Future visit scheduled:   No   Last ordered: Ozempic 09/29/2022 9 ml, 1 refill;   Proscar 11/18/2022 #90, 0 refills  Returned because PSA and Cr. Due per protocol.      Requested Prescriptions  Pending Prescriptions Disp Refills   OZEMPIC, 1 MG/DOSE, 4 MG/3ML SOPN [Pharmacy Med Name: OZEMPIC 4 MG/3 ML (1 MG/DOSE)]  1    Sig: INJECT 1MG  INTO THE SKIN ONCE A WEEK     Endocrinology:  Diabetes - GLP-1 Receptor Agonists - semaglutide Failed - 02/08/2023 11:42 AM      Failed - HBA1C in normal range and within 180 days    Hemoglobin A1C  Date Value Ref Range Status  01/14/2023 7.1 (A) 4.0 - 5.6 % Final   Hgb A1c MFr Bld  Date Value Ref Range Status  05/10/2022 5.7 (H) <5.7 % of total Hgb Final    Comment:    For someone without known diabetes, a hemoglobin  A1c value between 5.7% and 6.4% is consistent with prediabetes and should be confirmed with a  follow-up test. . For someone with known diabetes, a value <7% indicates that their diabetes is well controlled. A1c targets should be individualized based on duration of diabetes, age, comorbid conditions, and other considerations. . This assay result is consistent with an increased risk of diabetes. . Currently, no consensus exists regarding use of hemoglobin A1c for diagnosis of diabetes for children. .          Failed - Cr in normal range and within 360 days    Creat  Date Value Ref Range Status  11/27/2021 0.75 0.70 - 1.28 mg/dL Final         Passed - Valid encounter within last 6 months    Recent Outpatient Visits           3 weeks ago Type 2 diabetes mellitus with other specified complication, with long-term current use of insulin [facility])   [facility] [name], DO   9 months ago ALS (amyotrophic lateral sclerosis) [facility])   [facility] [name], DO   1 year ago ALS (amyotrophic lateral sclerosis) [facility])   [facility] [name], DO   1 year ago COVID-19 virus infection   [place], DO   1 year ago Type 2 diabetes mellitus with other specified complication, with long-term current use of insulin [facility])   [place], DO       Future Appointments             In 4 weeks Ralene Bathe, MD [place]             finasteride (PROSCAR) 5 MG tablet [Pharmacy Med Name: FINASTERIDE 5 MG TABLET] 90 tablet 0    Sig: TAKE 1 TABLET (5 MG TOTAL) BY MOUTH DAILY.     Urology: 5-alpha Reductase Inhibitors Failed - 02/08/2023 11:42 AM      Failed - PSA in normal range and within 360 days    PSA  Date Value Ref Range Status  11/27/2021 0.26 < OR = 4.00 ng/mL Final    Comment:    The total PSA value  from this assay system is  standardized against the WHO standard. The test  result will be approximately 20% lower when compared  to the equimolar-standardized total PSA (Beckman  Coulter). Comparison of serial PSA results should be  interpreted with this fact in mind. . This test was performed using the Siemens  chemiluminescent method. Values obtained from  different assay methods cannot be used interchangeably. PSA levels, regardless of value, should not be interpreted as absolute evidence of the presence or absence of disease.          Passed - Valid encounter within last 12 months    Recent Outpatient Visits           3 weeks ago Type 2 diabetes mellitus with other specified complication, with long-term current use of insulin [facility])   [facility] [name], DO   9 months ago ALS (amyotrophic lateral sclerosis) [facility])   [place] [name], DO   1 year ago ALS (amyotrophic lateral sclerosis) [facility])   [facility] [name], DO   1 year ago COVID-19 virus infection   [place], DO   1 year ago Type 2 diabetes mellitus with other specified complication, with long-term current use of insulin [facility])   [place], DO       Future Appointments             In 4 weeks Ralene Bathe, MD [place]

## 2023-02-23 ENCOUNTER — Encounter: Payer: Self-pay | Admitting: Family Medicine

## 2023-02-23 DIAGNOSIS — Z1211 Encounter for screening for malignant neoplasm of colon: Secondary | ICD-10-CM

## 2023-02-23 MED ORDER — REPATHA 140 MG/ML ~~LOC~~ SOSY: 1.0000 | PREFILLED_SYRINGE | SUBCUTANEOUS | 0 refills | Status: DC

## 2023-03-03 ENCOUNTER — Encounter: Payer: Self-pay | Admitting: *Deleted

## 2023-03-10 ENCOUNTER — Ambulatory Visit: Payer: Medicare HMO | Admitting: Dermatology

## 2023-03-14 ENCOUNTER — Other Ambulatory Visit: Payer: Self-pay | Admitting: Family Medicine

## 2023-03-14 DIAGNOSIS — Z794 Long term (current) use of insulin: Secondary | ICD-10-CM

## 2023-03-15 NOTE — Telephone Encounter (Signed)
Requested Prescriptions  Pending Prescriptions Disp Refills   metFORMIN (GLUCOPHAGE) 1000 MG tablet [Pharmacy Med Name: METFORMIN HCL 1,000 MG TABLET] 180 tablet 0    Sig: TAKE 1 TABLET (1,000 MG TOTAL) BY MOUTH TWICE A DAY WITH FOOD     Endocrinology:  Diabetes - Biguanides Failed - 03/14/2023 10:46 AM      Failed - Cr in normal range and within 360 days    Creat  Date Value Ref Range Status  11/27/2021 0.75 0.70 - 1.28 mg/dL Final         Failed - eGFR in normal range and within 360 days    GFR, Est African American  Date Value Ref Range Status  08/12/2020 112 > OR = 60 mL/min/1.63m2 Final   GFR, Est Non African American  Date Value Ref Range Status  08/12/2020 96 > OR = 60 mL/min/1.70m2 Final   GFR, Estimated  Date Value Ref Range Status  09/01/2021 >60 >60 mL/min Final    Comment:    (NOTE) Calculated using the CKD-EPI Creatinine Equation (2021)    eGFR  Date Value Ref Range Status  11/27/2021 97 > OR = 60 mL/min/1.11m2 Final    Comment:    The eGFR is based on the CKD-EPI 2021 equation. To calculate  the new eGFR from a previous Creatinine or Cystatin C result, go to https://www.kidney.org/professionals/ kdoqi/gfr%5Fcalculator          Failed - B12 Level in normal range and within 720 days    No results found for: "VITAMINB12"       Failed - CBC within normal limits and completed in the last 12 months    WBC  Date Value Ref Range Status  11/27/2021 6.6 3.8 - 10.8 Thousand/uL Final   RBC  Date Value Ref Range Status  11/27/2021 4.94 4.20 - 5.80 Million/uL Final   Hemoglobin  Date Value Ref Range Status  11/27/2021 15.1 13.2 - 17.1 g/dL Final   HCT  Date Value Ref Range Status  11/27/2021 45.5 38.5 - 50.0 % Final   MCHC  Date Value Ref Range Status  11/27/2021 33.2 32.0 - 36.0 g/dL Final   [facility]  Date Value Ref Range Status  11/27/2021 30.6 27.0 - 33.0 pg Final   MCV  Date Value Ref Range Status  11/27/2021 92.1 80.0 - 100.0 fL Final   No  results found for: "PLTCOUNTKUC", "LABPLAT", "POCPLA" RDW  Date Value Ref Range Status  11/27/2021 13.1 11.0 - 15.0 % Final         Passed - HBA1C is between 0 and 7.9 and within 180 days    Hemoglobin A1C  Date Value Ref Range Status  01/14/2023 7.1 (A) 4.0 - 5.6 % Final   Hgb A1c MFr Bld  Date Value Ref Range Status  05/10/2022 5.7 (H) <5.7 % of total Hgb Final    Comment:    For someone without known diabetes, a hemoglobin  A1c value between 5.7% and 6.4% is consistent with prediabetes and should be confirmed with a  follow-up test. . For someone with known diabetes, a value <7% indicates that their diabetes is well controlled. A1c targets should be individualized based on duration of diabetes, age, comorbid conditions, and other considerations. . This assay result is consistent with an increased risk of diabetes. . Currently, no consensus exists regarding use of hemoglobin A1c for diagnosis of diabetes for children. Verna Czech - Valid encounter within last 6  months    Recent Outpatient Visits           2 months ago Type 2 diabetes mellitus with other specified complication, with long-term current use of insulin [facility])   [place] [facility] Smitty Cords, DO   10 months ago ALS (amyotrophic lateral sclerosis) [facility])   [place] [facility] Smitty Cords, DO   1 year ago ALS (amyotrophic lateral sclerosis) [facility])   [place] [facility] Smitty Cords, DO   1 year ago COVID-19 virus infection   [place] [facility] [place], Netta Neat, DO   1 year ago Type 2 diabetes mellitus with other specified complication, with long-term current use of insulin [facility])   [place] [facility] [place], Netta Neat, [place]

## 2023-03-29 ENCOUNTER — Encounter: Payer: Self-pay | Admitting: Family Medicine

## 2023-03-29 DIAGNOSIS — E1169 Type 2 diabetes mellitus with other specified complication: Secondary | ICD-10-CM

## 2023-03-29 MED ORDER — OZEMPIC (1 MG/DOSE) 4 MG/3ML ~~LOC~~ SOPN: 1.0000 mg | PEN_INJECTOR | SUBCUTANEOUS | 1 refills | Status: DC

## 2023-05-12 ENCOUNTER — Telehealth: Payer: Self-pay | Admitting: Family Medicine

## 2023-05-12 ENCOUNTER — Ambulatory Visit (INDEPENDENT_AMBULATORY_CARE_PROVIDER_SITE_OTHER): Payer: Medicare HMO

## 2023-05-12 ENCOUNTER — Other Ambulatory Visit: Payer: Self-pay | Admitting: Family Medicine

## 2023-05-12 VITALS — BP 110/70 | Ht 69.0 in | Wt 238.0 lb

## 2023-05-12 DIAGNOSIS — Z0000 Encounter for general adult medical examination without abnormal findings: Secondary | ICD-10-CM | POA: Diagnosis not present

## 2023-05-12 DIAGNOSIS — E538 Deficiency of other specified B group vitamins: Secondary | ICD-10-CM

## 2023-05-12 DIAGNOSIS — E1169 Type 2 diabetes mellitus with other specified complication: Secondary | ICD-10-CM

## 2023-05-12 MED ORDER — OZEMPIC (2 MG/DOSE) 8 MG/3ML ~~LOC~~ SOPN
2.0000 mg | PEN_INJECTOR | SUBCUTANEOUS | 1 refills | Status: DC
Start: 2023-05-12 — End: 2023-09-16

## 2023-05-12 MED ORDER — CYANOCOBALAMIN 1000 MCG/ML IJ SOLN
1000.0000 ug | INTRAMUSCULAR | 1 refills | Status: DC
Start: 2023-05-12 — End: 2024-01-19

## 2023-05-12 NOTE — Progress Notes (Signed)
Subjective:   Jonathan Shelton is a 72 y.o. male who presents for Medicare Annual/Subsequent preventive examination.  Visit Complete: In person   Review of Systems     Cardiac Risk Factors include: advanced age (>47men, >24 women);diabetes mellitus;dyslipidemia;hypertension;male gender;obesity (BMI >30kg/m2);sedentary lifestyle     Objective:    Today's Vitals   05/12/23 1355 05/12/23 1357  BP: 110/70   Weight: 238 lb (108 kg)   Height: 5\' 9"  (1.753 m)   PainSc:  4    Body mass index is 35.15 kg/m.     05/12/2023    2:11 PM 04/30/2022    2:56 PM 08/31/2021    4:52 PM 08/24/2021    9:09 AM 04/21/2021    3:05 PM 04/02/2021    9:30 PM 01/22/2021    8:29 AM  Advanced Directives  Does Patient Have a Medical Advance Directive? No Yes Yes Yes Yes No Yes  Type of Special educational needs teacher of [place];Living will Living will Living will Healthcare Power of [place];Living will  Healthcare Power of [place];Living will  Does patient want to make changes to medical advance directive?  Yes (Inpatient - patient defers changing a medical advance directive and declines information at this time) No - Patient declined      Copy of Healthcare Power of Attorney in Chart?  No - copy requested   No - copy requested  Yes - validated most recent copy scanned in chart (See row information)  Would patient like information on creating a medical advance directive? No - Patient declined          Current Medications (verified) Outpatient Encounter Medications as of 05/12/2023  Medication Sig   Ascorbic Acid (VITAMIN C PO) Take 1 tablet by mouth 3 (three) times a week.   azelastine (ASTELIN) 0.1 % nasal spray Place 2 sprays into both nostrils 2 (two) times daily. Use in each nostril as directed   baclofen (LIORESAL) 10 MG tablet Take by mouth.   cholecalciferol (VITAMIN D3) 25 MCG (1000 UNIT) tablet Take 1,000 Units by mouth 3 (three) times a week.   Cholecalciferol 10 MCG (400 UNIT) CHEW  Chew by mouth.   Cinnamon 500 MG TABS Take by mouth.   cyanocobalamin 1000 MCG tablet Take by mouth.   Dextromethorphan-quiNIDine (NUEDEXTA) 20-10 MG capsule Take by mouth.   empagliflozin (JARDIANCE) 25 MG TABS tablet Take 1 tablet (25 mg total) by mouth daily.   Evolocumab (REPATHA) 140 MG/ML SOSY Inject 1 Device into the skin every 14 (fourteen) days.   finasteride (PROSCAR) 5 MG tablet TAKE 1 TABLET (5 MG TOTAL) BY MOUTH DAILY.   glucose blood test strip Check blood sugar up to 4 times daily   hydrocortisone (ANUSOL-HC) 25 MG suppository Place 1 suppository (25 mg total) rectally 2 (two) times daily.   insulin NPH Human (NOVOLIN N) 100 UNIT/ML injection INJECT 40 UNITS (0.4 ML) INTO THE SKIN AT BEDTIME. MAY USE REDUCED DOSE AS ADVISED.(NOT COVERED)   Insulin Syringe-Needle U-100 31G X 1/4" 0.5 ML MISC Use syringe to inject insulin into skin daily.   levalbuterol (XOPENEX HFA) 45 MCG/ACT inhaler Inhale 1-2 puffs into the lungs every 6 (six) hours as needed for wheezing.   magnesium gluconate (MAGONATE) 500 MG tablet Take 500 mg by mouth 2 (two) times daily.   metFORMIN (GLUCOPHAGE) 1000 MG tablet TAKE 1 TABLET (1,000 MG TOTAL) BY MOUTH TWICE A DAY WITH FOOD   naproxen sodium (ALEVE) 220 MG tablet Take 220 mg by mouth.  3x times per day   Omega-3 Fatty Acids (FISH OIL) 1000 MG CAPS Take by mouth.   OZEMPIC, 1 MG/DOSE, 4 MG/3ML SOPN Inject 1 mg as directed once a week.   pimecrolimus (ELIDEL) 1 % cream Apply twice daily to affected groin areas as needed for itching.   tamsulosin (FLOMAX) 0.4 MG CAPS capsule Take 2 capsules (0.8 mg total) by mouth daily.   Taurine 500 MG CAPS Take by mouth.   TURMERIC CURCUMIN PO Take by mouth.   valsartan (DIOVAN) 80 MG tablet TAKE 1 TABLET BY MOUTH EVERY DAY   Vitamin E (VITAMIN E/D-ALPHA NATURAL) 268 MG (400 UNIT) CAPS Take by mouth.   zinc gluconate 50 MG tablet Take 50 mg by mouth 3 (three) times a week.   aspirin 81 MG chewable tablet Chew 1 tablet (81  mg total) by mouth 2 (two) times daily. (Patient not taking: Reported on 05/12/2023)   meloxicam (MOBIC) 7.5 MG tablet Take 1 tablet (7.5 mg total) by mouth daily. (Patient not taking: Reported on 05/12/2023)   tiZANidine (ZANAFLEX) 4 MG tablet  (Patient not taking: Reported on 05/12/2023)   [DISCONTINUED] albuterol (VENTOLIN HFA) 108 (90 Base) MCG/ACT inhaler Inhale 1-2 puffs into the lungs every 4 (four) hours as needed for wheezing or shortness of breath.   No facility-administered encounter medications on file as of 05/12/2023.    Allergies (verified) Testosterone   History: Past Medical History:  Diagnosis Date   Allergy    Arthritis    Basal cell carcinoma    Face. x2. Mohs surgery in [place].   BPH (benign prostatic hyperplasia)    Colon polyps    Diabetes (HCC)    Diverticulosis    Gallstones    GERD (gastroesophageal reflux disease)    HOH (hard of hearing)    Kidney stones    Renal disorder    Wears glasses    Past Surgical History:  Procedure Laterality Date   BIOPSY  01/22/2021   Procedure: BIOPSY;  Surgeon: Meridee Score, Netty Starring., MD;  Location: [facility] ENDOSCOPY;  Service: Gastroenterology;;   CARPAL TUNNEL RELEASE     CHOLECYSTECTOMY     COLONOSCOPY WITH PROPOFOL N/A 02/06/2020   Procedure: COLONOSCOPY WITH PROPOFOL;  Surgeon: Toney Reil, MD;  Location: [facility] ENDOSCOPY;  Service: Gastroenterology;  Laterality: N/A;   COLONOSCOPY WITH PROPOFOL N/A 03/31/2020   Procedure: COLONOSCOPY WITH PROPOFOL;  Surgeon: Meridee Score Netty Starring., MD;  Location: [facility] ENDOSCOPY;  Service: Gastroenterology;  Laterality: N/A;   COLONOSCOPY WITH PROPOFOL N/A 01/22/2021   Procedure: COLONOSCOPY WITH PROPOFOL;  Surgeon: Meridee Score Netty Starring., MD;  Location: [facility] ENDOSCOPY;  Service: Gastroenterology;  Laterality: N/A;   ENDOSCOPIC MUCOSAL RESECTION N/A 03/31/2020   Procedure: ENDOSCOPIC MUCOSAL RESECTION;  Surgeon: Meridee Score Netty Starring., MD;  Location: [facility] ENDOSCOPY;  Service:  Gastroenterology;  Laterality: N/A;   HEMOSTASIS CLIP PLACEMENT  03/31/2020   Procedure: HEMOSTASIS CLIP PLACEMENT;  Surgeon: Lemar Lofty., MD;  Location: [facility] ENDOSCOPY;  Service: Gastroenterology;;   HERNIA REPAIR     IR KYPHO LUMBAR INC FX REDUCE BONE BX UNI/BIL CANNULATION INC/IMAGING  02/05/2021   IR RADIOLOGIST EVAL & MGMT  11/28/2020   IR RADIOLOGIST EVAL & MGMT  02/26/2021   POLYPECTOMY  03/31/2020   Procedure: POLYPECTOMY;  Surgeon: Lemar Lofty., MD;  Location: [facility] ENDOSCOPY;  Service: Gastroenterology;;   POLYPECTOMY  01/22/2021   Procedure: POLYPECTOMY;  Surgeon: Lemar Lofty., MD;  Location: [facility] ENDOSCOPY;  Service: Gastroenterology;;   SMALL INTESTINE SURGERY  SUBMUCOSAL LIFTING INJECTION  03/31/2020   Procedure: SUBMUCOSAL LIFTING INJECTION;  Surgeon: Meridee Score Netty Starring., MD;  Location: [facility] ENDOSCOPY;  Service: Gastroenterology;;   TOTAL KNEE ARTHROPLASTY Right 08/31/2021   Procedure: TOTAL KNEE ARTHROPLASTY;  Surgeon: Lyndle Herrlich, MD;  Location: [facility] ORS;  Service: Orthopedics;  Laterality: Right;   Family History  Problem Relation Age of Onset   Heart disease Mother 41   Heart disease Father    Stroke Father 73   Diabetes Father    Heart attack Father    Liver disease Neg Hx    Colon cancer Neg Hx    Esophageal cancer Neg Hx    Pancreatic cancer Neg Hx    Stomach cancer Neg Hx    Inflammatory bowel disease Neg Hx    Rectal cancer Neg Hx    Social History   Socioeconomic History   Marital status: Married    Spouse name: Not on file   Number of children: Not on file   Years of education: Graduate Degree   Highest education level: Master's degree (e.g., MA, MS, MEng, MEd, MSW, MBA)  Occupational History   Not on file  Tobacco Use   Smoking status: Former    Types: Cigarettes    Quit date: 1970    Years since quitting: 54.5   Smokeless tobacco: Former    Types: Snuff  Vaping Use   Vaping Use: Never used  Substance and  Sexual Activity   Alcohol use: Not Currently   Drug use: Not Currently    Comment: past   Sexual activity: Not on file  Other Topics Concern   Not on file  Social History Narrative   Not on file   Social Determinants of Health   Financial Resource Strain: Low Risk  (05/12/2023)   Overall Financial Resource Strain (CARDIA)    Difficulty of Paying Living Expenses: Not hard at all  Food Insecurity: No Food Insecurity (05/12/2023)   Hunger Vital Sign    Worried About Running Out of Food in the Last Year: Never true    Ran Out of Food in the Last Year: Never true  Transportation Needs: No Transportation Needs (05/12/2023)   PRAPARE - Administrator, Civil Service (Medical): No    Lack of Transportation (Non-Medical): No  Physical Activity: Inactive (05/12/2023)   Exercise Vital Sign    Days of Exercise per Week: 0 days    Minutes of Exercise per Session: 0 min  Stress: No Stress Concern Present (05/12/2023)   [employer] of Occupational Health - Occupational Stress Questionnaire    Feeling of Stress : Only a little  Social Connections: Moderately Integrated (05/12/2023)   Social Connection and Isolation Panel [NHANES]    Frequency of Communication with Friends and Family: More than three times a week    Frequency of Social Gatherings with Friends and Family: More than three times a week    Attends Religious Services: More than 4 times per year    Active Member of [employer] or Organizations: No    Attends Engineer, structural: Never    Marital Status: Married    Tobacco Counseling Counseling given: Not Answered   Clinical Intake:  Pre-visit preparation completed: Yes  Pain : 0-10 Pain Score: 4  Pain Type: Chronic pain Pain Location: Back     Nutritional Risks: None Diabetes: Yes CBG done?: No Did pt. bring in CBG monitor from home?: No  How often do you need to have someone help  you when you read instructions, pamphlets, or other written materials  from your doctor or pharmacy?: 1 - Never  Interpreter Needed?: No  Information entered by :: Kennedy Bucker, LPN   Activities of Daily Living    05/12/2023    2:13 PM 01/14/2023    2:31 PM  In your present state of health, do you have any difficulty performing the following activities:  Hearing? 0 1  Vision? 0 0  Difficulty concentrating or making decisions? 0 0  Walking or climbing stairs? 1 1  Dressing or bathing? 0 0  Doing errands, shopping? 0 0  Preparing Food and eating ? N   Using the Toilet? N   In the past six months, have you accidently leaked urine? N   Do you have problems with loss of bowel control? N   Managing your Medications? N   Managing your Finances? N   Housekeeping or managing your Housekeeping? Y     Patient Care Team: Smitty Cords, DO as PCP - General (Family Medicine)  Indicate any recent Medical Services you may have received from other than [facility] providers in the past year (date may be approximate).     Assessment:   This is a routine wellness examination for [place].  Hearing/Vision screen Hearing Screening - Comments:: No aids Vision Screening - Comments:: Wears glasses- [facility]  Dietary issues and exercise activities discussed:     Goals Addressed             This Visit's Progress    DIET - INCREASE WATER INTAKE         Depression Screen    05/12/2023    2:08 PM 01/14/2023    2:30 PM 04/30/2022    2:51 PM 06/02/2021    1:47 PM 04/21/2021    3:06 PM 03/18/2020   11:42 AM 02/11/2020    1:26 PM  PHQ 2/9 Scores  PHQ - 2 Score 0 0 0 2 6 0 0  PHQ- 9 Score 0 6 0 11 16      Fall Risk    05/12/2023    2:12 PM 04/30/2022    3:00 PM 06/02/2021    1:49 PM 06/02/2021    1:47 PM 04/21/2021    3:05 PM  Fall Risk   Falls in the past year? 1 1 1  0 1  Number falls in past yr: 1 0 1 0 1  Injury with Fall? 1 0 0 0 1  Comment     broken arm  Risk for fall due to : History of fall(s) History of fall(s)   Impaired  balance/gait;Medication side effect;Impaired mobility  Follow up Falls prevention discussed;Falls evaluation completed Falls prevention discussed;Falls evaluation completed Falls evaluation completed Falls evaluation completed Falls evaluation completed;Education provided;Falls prevention discussed    MEDICARE RISK AT HOME:  Medicare Risk at Home - 05/12/23 1414     Any stairs in or around the home? Yes    If so, are there any without handrails? No    Home free of loose throw rugs in walkways, pet beds, electrical cords, etc? Yes    Adequate lighting in your home to reduce risk of falls? Yes    Life alert? No    Use of a cane, walker or w/c? Yes   rollator   Grab bars in the bathroom? Yes    Shower chair or bench in shower? Yes    Elevated toilet seat or a handicapped toilet? Yes  TIMED UP AND GO:  Was the test performed?  Yes  Length of time to ambulate 10 feet: 6 sec Gait slow and steady with assistive device    Cognitive Function:        05/12/2023    2:16 PM 12/03/2021   10:23 AM 04/21/2021    3:17 PM  6CIT Screen  What Year? 0 points 0 points 0 points  What month? 0 points 0 points 0 points  What time? 0 points 0 points 0 points  Count back from 20 0 points 0 points 0 points  Months in reverse 0 points 0 points 0 points  Repeat phrase 2 points 0 points 0 points  Total Score 2 points 0 points 0 points    Immunizations Immunization History  Administered Date(s) Administered   Fluad Quad(high Dose 65+) 09/04/2019, 08/19/2020, 09/01/2021   Influenza, High Dose Seasonal PF 09/08/2017   Pneumococcal Conjugate-13 08/19/2020    TDAP status: Due, Education has been provided regarding the importance of this vaccine. Advised may receive this vaccine at local pharmacy or Health Dept. Aware to provide a copy of the vaccination record if obtained from local pharmacy or Health Dept. Verbalized acceptance and understanding.  Flu Vaccine status: Declined, Education  has been provided regarding the importance of this vaccine but patient still declined. Advised may receive this vaccine at local pharmacy or Health Dept. Aware to provide a copy of the vaccination record if obtained from local pharmacy or Health Dept. Verbalized acceptance and understanding.  Pneumococcal vaccine status: Up to date  Covid-19 vaccine status: Declined, Education has been provided regarding the importance of this vaccine but patient still declined. Advised may receive this vaccine at local pharmacy or Health Dept.or vaccine clinic. Aware to provide a copy of the vaccination record if obtained from local pharmacy or Health Dept. Verbalized acceptance and understanding.  Qualifies for Shingles Vaccine? Yes   Zostavax completed No   Shingrix Completed?: No.    Education has been provided regarding the importance of this vaccine. Patient has been advised to call insurance company to determine out of pocket expense if they have not yet received this vaccine. Advised may also receive vaccine at local pharmacy or Health Dept. Verbalized acceptance and understanding.  Screening Tests Health Maintenance  Topic Date Due   COVID-19 Vaccine (1) Never done   Diabetic kidney evaluation - Urine ACR  Never done   DTaP/Tdap/Td (1 - Tdap) Never done   Zoster Vaccines- Shingrix (1 of 2) Never done   OPHTHALMOLOGY EXAM  04/03/2021   Pneumonia Vaccine 34+ Years old (2 of 2 - PPSV23 or PCV20) 08/19/2021   FOOT EXAM  08/19/2021   Colonoscopy  01/22/2022   Diabetic kidney evaluation - eGFR measurement  11/27/2022   INFLUENZA VACCINE  06/16/2023   HEMOGLOBIN A1C  07/17/2023   Medicare Annual Wellness (AWV)  05/11/2024   Hepatitis C Screening  Completed   HPV VACCINES  Aged Out   Fecal DNA (Cologuard)  Discontinued    Health Maintenance  Health Maintenance Due  Topic Date Due   COVID-19 Vaccine (1) Never done   Diabetic kidney evaluation - Urine ACR  Never done   DTaP/Tdap/Td (1 - Tdap) Never  done   Zoster Vaccines- Shingrix (1 of 2) Never done   OPHTHALMOLOGY EXAM  04/03/2021   Pneumonia Vaccine 63+ Years old (2 of 2 - PPSV23 or PCV20) 08/19/2021   FOOT EXAM  08/19/2021   Colonoscopy  01/22/2022   Diabetic kidney evaluation -  eGFR measurement  11/27/2022    Colorectal cancer screening: Type of screening: Colonoscopy. Completed 01/22/21. Repeat every 3 years PER PATIENT  Lung Cancer Screening: (Low Dose CT Chest recommended if Age 59-80 years, 20 pack-year currently smoking OR have quit w/in 15years.) does not qualify.     Additional Screening:  Hepatitis C Screening: does qualify; Completed 08/12/20  Vision Screening: Recommended annual ophthalmology exams for early detection of glaucoma and other disorders of the eye. Is the patient up to date with their annual eye exam?  Yes  Who is the provider or what is the name of the office in which the patient attends annual eye exams? [facility] If pt is not established with a provider, would they like to be referred to a provider to establish care? No .   Dental Screening: Recommended annual dental exams for proper oral hygiene  Diabetic Foot Exam: Diabetic Foot Exam: Completed 08/19/20  Community Resource Referral / Chronic Care Management: CRR required this visit?  No   CCM required this visit?  No     Plan:     I have personally reviewed and noted the following in the patient's chart:   Medical and social history Use of alcohol, tobacco or illicit drugs  Current medications and supplements including opioid prescriptions. Patient is not currently taking opioid prescriptions. Functional ability and status Nutritional status Physical activity Advanced directives List of other physicians Hospitalizations, surgeries, and ER visits in previous 12 months Vitals Screenings to include cognitive, depression, and falls Referrals and appointments  In addition, I have reviewed and discussed with patient certain  preventive protocols, quality metrics, and best practice recommendations. A written personalized care plan for preventive services as well as general preventive health recommendations were provided to patient.     Hal Hope, LPN   02/21/8118   After Visit Summary: (MyChart) Due to this being a telephonic visit, the after visit summary with patients personalized plan was offered to patient via MyChart   Nurse Notes: none

## 2023-05-12 NOTE — Patient Instructions (Signed)
Mr. Jonathan Shelton , Thank you for taking time to come for your Medicare Wellness Visit. I appreciate your ongoing commitment to your health goals. Please review the following plan we discussed and let me know if I can assist you in the future.   These are the goals we discussed:  Goals      DIET - EAT MORE FRUITS AND VEGETABLES     DIET - INCREASE WATER INTAKE     Patient Stated     04/21/2021, wants to find out what is wrong with speech        This is a list of the screening recommended for you and due dates:  Health Maintenance  Topic Date Due   COVID-19 Vaccine (1) Never done   Yearly kidney health urinalysis for diabetes  Never done   DTaP/Tdap/Td vaccine (1 - Tdap) Never done   Zoster (Shingles) Vaccine (1 of 2) Never done   Eye exam for diabetics  04/03/2021   Pneumonia Vaccine (2 of 2 - PPSV23 or PCV20) 08/19/2021   Complete foot exam   08/19/2021   Colon Cancer Screening  01/22/2022   Yearly kidney function blood test for diabetes  11/27/2022   Flu Shot  06/16/2023   Hemoglobin A1C  07/17/2023   Medicare Annual Wellness Visit  05/11/2024   Hepatitis C Screening  Completed   HPV Vaccine  Aged Out   Cologuard (Stool DNA test)  Discontinued    Advanced directives: no  Conditions/risks identified: none  Next appointment: Follow up in one year for your annual wellness visit. 05/17/24 @ 2:30 pm in person  Preventive Care 65 Years and Older, Male  Preventive care refers to lifestyle choices and visits with your health care provider that can promote health and wellness. What does preventive care include? A yearly physical exam. This is also called an annual well check. Dental exams once or twice a year. Routine eye exams. Ask your health care provider how often you should have your eyes checked. Personal lifestyle choices, including: Daily care of your teeth and gums. Regular physical activity. Eating a healthy diet. Avoiding tobacco and drug use. Limiting alcohol  use. Practicing safe sex. Taking low doses of aspirin every day. Taking vitamin and mineral supplements as recommended by your health care provider. What happens during an annual well check? The services and screenings done by your health care provider during your annual well check will depend on your age, overall health, lifestyle risk factors, and family history of disease. Counseling  Your health care provider may ask you questions about your: Alcohol use. Tobacco use. Drug use. Emotional well-being. Home and relationship well-being. Sexual activity. Eating habits. History of falls. Memory and ability to understand (cognition). Work and work Astronomer. Screening  You may have the following tests or measurements: Height, weight, and BMI. Blood pressure. Lipid and cholesterol levels. These may be checked every 5 years, or more frequently if you are over 72 years old. Skin check. Lung cancer screening. You may have this screening every year starting at age 72 if you have a 30-pack-year history of smoking and currently smoke or have quit within the past 15 years. Fecal occult blood test (FOBT) of the stool. You may have this test every year starting at age 72. Flexible sigmoidoscopy or colonoscopy. You may have a sigmoidoscopy every 5 years or a colonoscopy every 10 years starting at age 72. Prostate cancer screening. Recommendations will vary depending on your family history and other risks. Hepatitis C blood  test. Hepatitis B blood test. Sexually transmitted disease (STD) testing. Diabetes screening. This is done by checking your blood sugar (glucose) after you have not eaten for a while (fasting). You may have this done every 1-3 years. Abdominal aortic aneurysm (AAA) screening. You may need this if you are a current or former smoker. Osteoporosis. You may be screened starting at age 72 if you are at high risk. Talk with your health care provider about your test results,  treatment options, and if necessary, the need for more tests. Vaccines  Your health care provider may recommend certain vaccines, such as: Influenza vaccine. This is recommended every year. Tetanus, diphtheria, and acellular pertussis (Tdap, Td) vaccine. You may need a Td booster every 10 years. Zoster vaccine. You may need this after age 70. Pneumococcal 13-valent conjugate (PCV13) vaccine. One dose is recommended after age 72. Pneumococcal polysaccharide (PPSV23) vaccine. One dose is recommended after age 72. Talk to your health care provider about which screenings and vaccines you need and how often you need them. This information is not intended to replace advice given to you by your health care provider. Make sure you discuss any questions you have with your health care provider. Document Released: 11/28/2015 Document Revised: 07/21/2016 Document Reviewed: 09/02/2015 Elsevier Interactive Patient Education  2017 [place] Prevention in the Home Falls can cause injuries. They can happen to people of all ages. There are many things you can do to make your home safe and to help prevent falls. What can I do on the outside of my home? Regularly fix the edges of walkways and driveways and fix any cracks. Remove anything that might make you trip as you walk through a door, such as a raised step or threshold. Trim any bushes or trees on the path to your home. Use bright outdoor lighting. Clear any walking paths of anything that might make someone trip, such as rocks or tools. Regularly check to see if handrails are loose or broken. Make sure that both sides of any steps have handrails. Any raised decks and porches should have guardrails on the edges. Have any leaves, snow, or ice cleared regularly. Use sand or salt on walking paths during winter. Clean up any spills in your garage right away. This includes oil or grease spills. What can I do in the bathroom? Use night  lights. Install grab bars by the toilet and in the tub and shower. Do not use towel bars as grab bars. Use non-skid mats or decals in the tub or shower. If you need to sit down in the shower, use a plastic, non-slip stool. Keep the floor dry. Clean up any water that spills on the floor as soon as it happens. Remove soap buildup in the tub or shower regularly. Attach bath mats securely with double-sided non-slip rug tape. Do not have throw rugs and other things on the floor that can make you trip. What can I do in the bedroom? Use night lights. Make sure that you have a light by your bed that is easy to reach. Do not use any sheets or blankets that are too big for your bed. They should not hang down onto the floor. Have a firm chair that has side arms. You can use this for support while you get dressed. Do not have throw rugs and other things on the floor that can make you trip. What can I do in the kitchen? Clean up any spills right away. Avoid walking on wet  floors. Keep items that you use a lot in easy-to-reach places. If you need to reach something above you, use a strong step stool that has a grab bar. Keep electrical cords out of the way. Do not use floor polish or wax that makes floors slippery. If you must use wax, use non-skid floor wax. Do not have throw rugs and other things on the floor that can make you trip. What can I do with my stairs? Do not leave any items on the stairs. Make sure that there are handrails on both sides of the stairs and use them. Fix handrails that are broken or loose. Make sure that handrails are as long as the stairways. Check any carpeting to make sure that it is firmly attached to the stairs. Fix any carpet that is loose or worn. Avoid having throw rugs at the top or bottom of the stairs. If you do have throw rugs, attach them to the floor with carpet tape. Make sure that you have a light switch at the top of the stairs and the bottom of the stairs. If  you do not have them, ask someone to add them for you. What else can I do to help prevent falls? Wear shoes that: Do not have high heels. Have rubber bottoms. Are comfortable and fit you well. Are closed at the toe. Do not wear sandals. If you use a stepladder: Make sure that it is fully opened. Do not climb a closed stepladder. Make sure that both sides of the stepladder are locked into place. Ask someone to hold it for you, if possible. Clearly mark and make sure that you can see: Any grab bars or handrails. First and last steps. Where the edge of each step is. Use tools that help you move around (mobility aids) if they are needed. These include: Canes. Walkers. Scooters. Crutches. Turn on the lights when you go into a dark area. Replace any light bulbs as soon as they burn out. Set up your furniture so you have a clear path. Avoid moving your furniture around. If any of your floors are uneven, fix them. If there are any pets around you, be aware of where they are. Review your medicines with your doctor. Some medicines can make you feel dizzy. This can increase your chance of falling. Ask your doctor what other things that you can do to help prevent falls. This information is not intended to replace advice given to you by your health care provider. Make sure you discuss any questions you have with your health care provider. Document Released: 08/28/2009 Document Revised: 04/08/2016 Document Reviewed: 12/06/2014 Elsevier Interactive Patient Education  2017 [employer].

## 2023-05-12 NOTE — Telephone Encounter (Signed)
Reference to request by Kennedy Bucker LPN after she saw patient for AMW recently.  Please notify patient of the following:  I have ordered Vitamin B12 injection x 1 per month. Ordered 3 vials for a 3 month supply. He can pick up vial from pharmacy and he will need to be scheduled for monthly Vitamin B12 nurse visit for injection and bring vial to the apt.  2. I have sent rx dose increase Ozempic 2mg  weekly inj to pharmacy. He can stop the 1mg  dose and start 2mg  when he receives it.  3. He will need apt within next 4-6 weeks, as we had originally planned for a lab panel and follow-up in the Summer. Has not been scheduled yet.  Saralyn Pilar, DO [facility] 05/12/2023, 5:57 PM

## 2023-05-13 ENCOUNTER — Other Ambulatory Visit: Payer: Self-pay | Admitting: Family Medicine

## 2023-05-13 DIAGNOSIS — Z794 Long term (current) use of insulin: Secondary | ICD-10-CM

## 2023-05-16 NOTE — Telephone Encounter (Signed)
Requested Prescriptions  Pending Prescriptions Disp Refills   JARDIANCE 25 MG TABS tablet [Pharmacy Med Name: JARDIANCE 25 MG TABLET] 30 tablet 0    Sig: TAKE 1 TABLET (25 MG TOTAL) BY MOUTH DAILY.     Endocrinology:  Diabetes - SGLT2 Inhibitors Failed - 05/13/2023  2:16 AM      Failed - Cr in normal range and within 360 days    Creat  Date Value Ref Range Status  11/27/2021 0.75 0.70 - 1.28 mg/dL Final         Failed - eGFR in normal range and within 360 days    GFR, Est African American  Date Value Ref Range Status  08/12/2020 112 > OR = 60 mL/min/1.52m2 Final   GFR, Est Non African American  Date Value Ref Range Status  08/12/2020 96 > OR = 60 mL/min/1.50m2 Final   GFR, Estimated  Date Value Ref Range Status  09/01/2021 >60 >60 mL/min Final    Comment:    (NOTE) Calculated using the CKD-EPI Creatinine Equation (2021)    eGFR  Date Value Ref Range Status  11/27/2021 97 > OR = 60 mL/min/1.10m2 Final    Comment:    The eGFR is based on the CKD-EPI 2021 equation. To calculate  the new eGFR from a previous Creatinine or Cystatin C result, go to https://www.kidney.org/professionals/ kdoqi/gfr%5Fcalculator          Passed - HBA1C is between 0 and 7.9 and within 180 days    Hemoglobin A1C  Date Value Ref Range Status  01/14/2023 7.1 (A) 4.0 - 5.6 % Final   Hgb A1c MFr Bld  Date Value Ref Range Status  05/10/2022 5.7 (H) <5.7 % of total Hgb Final    Comment:    For someone without known diabetes, a hemoglobin  A1c value between 5.7% and 6.4% is consistent with prediabetes and should be confirmed with a  follow-up test. . For someone with known diabetes, a value <7% indicates that their diabetes is well controlled. A1c targets should be individualized based on duration of diabetes, age, comorbid conditions, and other considerations. . This assay result is consistent with an increased risk of diabetes. . Currently, no consensus exists regarding use  of hemoglobin A1c for diagnosis of diabetes for children. Verna Czech - Valid encounter within last 6 months    Recent Outpatient Visits           4 months ago Type 2 diabetes mellitus with other specified complication, with long-term current use of insulin [facility])   [place] [facility] [place], Netta Neat, DO   1 year ago ALS (amyotrophic lateral sclerosis) [facility])   [facility] Smitty Cords, DO   1 year ago ALS (amyotrophic lateral sclerosis) [facility])   [place] [facility] Smitty Cords, DO   1 year ago COVID-19 virus infection   [place] [facility] [place], Netta Neat, DO   1 year ago Type 2 diabetes mellitus with other specified complication, with long-term current use of insulin [facility])   [place] [facility] Althea Charon, Netta Neat, DO       Future Appointments             In 3 weeks Althea Charon, Netta Neat, DO  [facility], [facility]

## 2023-05-18 ENCOUNTER — Other Ambulatory Visit: Payer: Self-pay | Admitting: Internal Medicine

## 2023-05-18 DIAGNOSIS — E1169 Type 2 diabetes mellitus with other specified complication: Secondary | ICD-10-CM

## 2023-05-18 NOTE — Telephone Encounter (Signed)
Requested medication (s) are due for refill today: yes  Requested medication (s) are on the active medication list: yes  Last refill:  02/23/23 #49mL/0  Future visit scheduled: yes  Notes to clinic:  Unable to refill per protocol due to failed labs, no updated results.      Requested Prescriptions  Pending Prescriptions Disp Refills   REPATHA 140 MG/ML SOSY [Pharmacy Med Name: REPATHA 140 MG/ML SYRINGE]      Sig: Inject 1 Device into the skin every 14 (fourteen) days.     Cardiovascular: PCSK9 Inhibitors Failed - 05/18/2023  7:44 AM      Failed - Lipid Panel completed within the last 12 months    Cholesterol  Date Value Ref Range Status  11/27/2021 101 <200 mg/dL Final   LDL Cholesterol (Calc)  Date Value Ref Range Status  11/27/2021 43 mg/dL (calc) Final    Comment:    Reference range: <100 . Desirable range <100 mg/dL for primary prevention;   <70 mg/dL for patients with CHD or diabetic patients  with > or = 2 CHD risk factors. Marland Kitchen LDL-C is now calculated using the Martin-Hopkins  calculation, which is a validated novel method providing  better accuracy than the Friedewald equation in the  estimation of LDL-C.  Horald Pollen et al. Lenox Ahr. 1610;960(45): 2061-2068  (http://education.Quest[employer].com/faq/FAQ164)    HDL  Date Value Ref Range Status  11/27/2021 41 > OR = 40 mg/dL Final   Triglycerides  Date Value Ref Range Status  11/27/2021 88 <150 mg/dL Final         Passed - Valid encounter within last 12 months    Recent Outpatient Visits           4 months ago Type 2 diabetes mellitus with other specified complication, with long-term current use of insulin [facility])   [facility] [place], Netta Neat, DO   1 year ago ALS (amyotrophic lateral sclerosis) [facility])   [place] [facility] Smitty Cords, DO   1 year ago ALS (amyotrophic lateral sclerosis) [facility]   [place] [facility]  Smitty Cords, DO   1 year ago COVID-19 virus infection   [place] [facility] [place], Netta Neat, DO   1 year ago Type 2 diabetes mellitus with other specified complication, with long-term current use of insulin [facility])   [place] [facility] Althea Charon, Netta Neat, DO       Future Appointments             In 3 weeks Althea Charon, Netta Neat, DO [place] [facility], [facility]

## 2023-05-29 ENCOUNTER — Encounter: Payer: Self-pay | Admitting: Intensive Care

## 2023-05-29 ENCOUNTER — Emergency Department
Admission: EM | Admit: 2023-05-29 | Discharge: 2023-05-29 | Disposition: A | Discharge status: Home / Self Care | Payer: Medicare HMO | Attending: Student in an Organized Health Care Education/Training Program | Admitting: Student in an Organized Health Care Education/Training Program

## 2023-05-29 ENCOUNTER — Emergency Department: Payer: Medicare HMO

## 2023-05-29 ENCOUNTER — Other Ambulatory Visit: Payer: Self-pay

## 2023-05-29 DIAGNOSIS — S0003XA Contusion of scalp, initial encounter: Secondary | ICD-10-CM | POA: Insufficient documentation

## 2023-05-29 DIAGNOSIS — Y9389 Activity, other specified: Secondary | ICD-10-CM | POA: Diagnosis not present

## 2023-05-29 DIAGNOSIS — E119 Type 2 diabetes mellitus without complications: Secondary | ICD-10-CM | POA: Insufficient documentation

## 2023-05-29 DIAGNOSIS — W0110XA Fall on same level from slipping, tripping and stumbling with subsequent striking against unspecified object, initial encounter: Secondary | ICD-10-CM | POA: Diagnosis not present

## 2023-05-29 DIAGNOSIS — S0990XA Unspecified injury of head, initial encounter: Secondary | ICD-10-CM | POA: Diagnosis present

## 2023-05-29 DIAGNOSIS — W19XXXA Unspecified fall, initial encounter: Secondary | ICD-10-CM

## 2023-05-29 DIAGNOSIS — T07XXXA Unspecified multiple injuries, initial encounter: Secondary | ICD-10-CM

## 2023-05-29 HISTORY — DX: Amyotrophic lateral sclerosis: G12.21

## 2023-05-29 NOTE — ED Provider Notes (Signed)
[facility] Provider Note    Event Date/Time   First MD Initiated Contact with Patient 05/29/23 1227     (approximate)   History   Fall   HPI  Jonathan Shelton is a 72 y.o. male with history of ALS, diabetes, GERD and other medical problems presents emergency department after a fall.  Patient states he was trying to put the rollator in the back of the car when he lost his balance and fell backwards landing on his bottom and hitting the back of his head.  No LOC.  No vomiting.  No numbness or tingling      Physical Exam   Triage Vital Signs: ED Triage Vitals [05/29/23 1214]  Encounter Vitals Group     BP (!) 140/79     Systolic BP Percentile      Diastolic BP Percentile      Pulse Rate 86     Resp 16     Temp 98.5 F (36.9 C)     Temp Source Oral     SpO2 94 %     Weight 229 lb (103.9 kg)     Height 5\' 9"  (1.753 m)     Head Circumference      Peak Flow      Pain Score 8     Pain Loc      Pain Education      Exclude from Growth Chart     Most recent vital signs: Vitals:   05/29/23 1214  BP: (!) 140/79  Pulse: 86  Resp: 16  Temp: 98.5 F (36.9 C)  SpO2: 94%     General: Awake, no distress.   CV:  Good peripheral perfusion. regular rate and  rhythm Resp:  Normal effort.  Abd:  No distention.   Other:  Hematoma noted to the back of skull, C-spine minimally tender, lumbar spine is tender to palpation, neurovascular appears to be intact   ED Results / Procedures / Treatments   Labs (all labs ordered are listed, but only abnormal results are displayed) Labs Reviewed - No data to display   EKG     RADIOLOGY CT of the head, cervical spine, lumbar spine    PROCEDURES:   Procedures   MEDICATIONS ORDERED IN ED: Medications - No data to display   IMPRESSION / MDM / ASSESSMENT AND PLAN / ED COURSE  I reviewed the triage vital signs and the nursing notes.                              Differential diagnosis  includes, but is not limited to, subdural, subarachnoid, skull fracture, C-spine/lumbar spine fracture, contusion, strain  Patient's presentation is most consistent with acute presentation with potential threat to life or bodily function.   CT of the head, C-spine, lumbar spine ordered  I did independently review the radiologist reading of the CT of the head and cervical spine, and interpret these as being negative for any acute abnormality  I did add a CT lumbar spine because on my exam the patient is very tender along the lumbar spine and he already has a compression fracture at L1   CT lumbar spine was independently reviewed interpreted by me as being negative for acute abnormality.  Radiologist comments no acute findings but does have some degenerative changes long with impingement.  I did explain the findings to the patient.  Offered pain medication but she refused  this.  He is to follow-up with his regular doctor if not improving to 3 days.  Return emergency department worsening.  He and his wife are in agreement treatment plan.  He was discharged stable condition.   FINAL CLINICAL IMPRESSION(S) / ED DIAGNOSES   Final diagnoses:  Fall, initial encounter  Minor head injury, initial encounter  Multiple contusions     Rx / DC Orders   ED Discharge Orders     None        Note:  This document was prepared using Dragon voice recognition software and may include unintentional dictation errors.    Faythe Ghee, PA-C 05/29/23 1342    Willy Eddy, MD 05/29/23 [phone]

## 2023-05-29 NOTE — ED Triage Notes (Signed)
Patient presents to ER after fall. Patient was lifting his Rolator to go in trunk and fall backwards and hit head. Hematoma noted to back of head. Bleeding subsided.

## 2023-05-30 ENCOUNTER — Telehealth: Payer: Self-pay

## 2023-05-30 NOTE — Transitions of Care (Post Inpatient/ED Visit) (Signed)
05/30/2023  Name: Jonathan Shelton MRN: [id number] DOB: 03/24/1951  Today's TOC FU Call Status: Today's TOC FU Call Status:: Successful TOC FU Call Competed TOC FU Call Complete Date: 05/30/23  Transition Care Management Follow-up Telephone Call Date of Discharge: 05/29/23 Discharge Facility: [facility]) Type of Discharge: Emergency Department Reason for ED Visit: Other: (Fall) How have you been since you were released from the hospital?: Better Any questions or concerns?: No  Items Reviewed: Did you receive and understand the discharge instructions provided?: Yes Medications obtained,verified, and reconciled?: Yes (Medications Reviewed) Any new allergies since your discharge?: No Dietary orders reviewed?: No Do you have support at home?: No  Medications Reviewed Today: Medications Reviewed Today     Reviewed by Lucas Mallow, RN (Registered Nurse) on 05/29/23 at 1255  Med List Status: <None>   Medication Order Taking? Sig Documenting Provider Last Dose Status Informant  Ascorbic Acid (VITAMIN C PO) [id number]  Take 1 tablet by mouth 3 (three) times a week. [provider]  Active Self  aspirin 81 MG chewable tablet [id number]  Chew 1 tablet (81 mg total) by mouth 2 (two) times daily.  Patient not taking: Reported on 05/12/2023   Altamese Cabal, PA-C  Active   azelastine (ASTELIN) 0.1 % nasal spray [id number]  Place 2 sprays into both nostrils 2 (two) times daily. Use in each nostril as directed Smitty Cords, DO  Active            Med Note Perlie Gold May 12, 2023  2:01 PM) Takes PRN  baclofen (LIORESAL) 10 MG tablet [id number]  Take by mouth. [provider]  Active   cholecalciferol (VITAMIN D3) 25 MCG (1000 UNIT) tablet [id number]  Take 1,000 Units by mouth 3 (three) times a week. [provider]  Active Self  Cholecalciferol 10 MCG (400 UNIT) CHEW [id number]  Chew by mouth. [provider]   Active   Cinnamon 500 MG TABS [id number]  Take by mouth. [provider]  Active   cyanocobalamin (VITAMIN B12) 1000 MCG/ML injection [id number]  Inject 1 mL (1,000 mcg total) into the muscle every 30 (thirty) days. Smitty Cords, DO  Active   Dextromethorphan-quiNIDine (NUEDEXTA) 20-10 MG capsule [id number]  Take by mouth. [provider]  Active   empagliflozin (JARDIANCE) 25 MG TABS tablet [id number]  TAKE 1 TABLET (25 MG TOTAL) BY MOUTH DAILY. Althea Charon, Netta Neat, DO  Active   finasteride (PROSCAR) 5 MG tablet [id number]  TAKE 1 TABLET (5 MG TOTAL) BY MOUTH DAILY. Smitty Cords, DO  Active   glucose blood test strip [id number]  Check blood sugar up to 4 times daily Smitty Cords, DO  Active Self  hydrocortisone (ANUSOL-HC) 25 MG suppository [id number]  Place 1 suppository (25 mg total) rectally 2 (two) times daily. Althea Charon, Netta Neat, DO  Active   insulin NPH Human (NOVOLIN N) 100 UNIT/ML injection [id number]  INJECT 40 UNITS (0.4 ML) INTO THE SKIN AT BEDTIME. MAY USE REDUCED DOSE AS ADVISED.(NOT COVERED) Althea Charon, Netta Neat, DO  Active   Insulin Syringe-Needle U-100 31G X 1/4" 0.5 ML MISC [id number]  Use syringe to inject insulin into skin daily. Smitty Cords, DO  Active Self  levalbuterol [facility] HFA) 45 MCG/ACT inhaler [id number]  Inhale 1-2 puffs into the lungs every 6 (six) hours as needed for wheezing. Smitty Cords, DO  Active   magnesium gluconate (MAGONATE) 500 MG tablet [id number]  Take 500 mg  by mouth 2 (two) times daily. [provider]  Active   meloxicam (MOBIC) 7.5 MG tablet [id number]  Take 1 tablet (7.5 mg total) by mouth daily.  Patient not taking: Reported on 05/12/2023   Judi Saa, DO  Active Self           Med Note Ileene Musa Feb 02, 2021 10:12 AM) Alternates taking either meloxicam or aleve    metFORMIN (GLUCOPHAGE) 1000 MG tablet [id number]  TAKE 1 TABLET (1,000 MG  TOTAL) BY MOUTH TWICE A DAY WITH FOOD Smitty Cords, DO  Active   naproxen sodium (ALEVE) 220 MG tablet [id number]  Take 220 mg by mouth. 3x times per day [provider]  Active   Omega-3 Fatty Acids (FISH OIL) 1000 MG CAPS [id number]  Take by mouth. [provider]  Active   OZEMPIC, 2 MG/DOSE, 8 MG/3ML SOPN [id number]  Inject 2 mg into the skin once a week. Karamalegos, Netta Neat, DO  Active   pimecrolimus (ELIDEL) 1 % cream [id number]  Apply twice daily to affected groin areas as needed for itching. Deirdre Evener, MD  Active   REPATHA 140 MG/ML Konrad Penta [id number]  Inject 140 mg into the skin every 14 (fourteen) days. Smitty Cords, DO  Active   tamsulosin (FLOMAX) 0.4 MG CAPS capsule [id number]  Take 2 capsules (0.8 mg total) by mouth daily. Smitty Cords, DO  Active   Taurine 500 MG CAPS [id number]  Take by mouth. [provider]  Active   tiZANidine (ZANAFLEX) 4 MG tablet [id number]    Patient not taking: Reported on 05/12/2023   [provider]  Active   TURMERIC CURCUMIN PO [id number]  Take by mouth. [provider]  Active   valsartan (DIOVAN) 80 MG tablet [id number]  TAKE 1 TABLET BY MOUTH EVERY DAY Karamalegos, Alexander J, DO  Active   Vitamin E (VITAMIN E/D-ALPHA NATURAL) 268 MG (400 UNIT) CAPS [id number]  Take by mouth. [provider]  Active   zinc gluconate 50 MG tablet [id number]  Take 50 mg by mouth 3 (three) times a week. [provider]  Active Self            Home Care and Equipment/Supplies: Were Home Health Services Ordered?: No Any new equipment or medical supplies ordered?: No  Functional Questionnaire: Do you need assistance with bathing/showering or dressing?: No Do you need assistance with meal preparation?: No Do you need assistance with eating?: No Do you have difficulty maintaining continence: No Do you need assistance with getting out of bed/getting out of a  chair/moving?: No Do you have difficulty managing or taking your medications?: No  Follow up appointments reviewed: PCP Follow-up appointment confirmed?: NA Do you understand care options if your condition(s) worsen?: Yes-patient verbalized understanding    SIGNATURE   Kavin Leech, CMA

## 2023-06-10 ENCOUNTER — Encounter: Payer: Self-pay | Admitting: Family Medicine

## 2023-06-10 ENCOUNTER — Ambulatory Visit (INDEPENDENT_AMBULATORY_CARE_PROVIDER_SITE_OTHER): Payer: Medicare HMO | Admitting: Family Medicine

## 2023-06-10 VITALS — BP 118/52 | HR 68 | Ht 69.0 in | Wt 229.0 lb

## 2023-06-10 DIAGNOSIS — E1169 Type 2 diabetes mellitus with other specified complication: Secondary | ICD-10-CM

## 2023-06-10 DIAGNOSIS — N401 Enlarged prostate with lower urinary tract symptoms: Secondary | ICD-10-CM

## 2023-06-10 DIAGNOSIS — G1221 Amyotrophic lateral sclerosis: Secondary | ICD-10-CM

## 2023-06-10 DIAGNOSIS — I10 Essential (primary) hypertension: Secondary | ICD-10-CM | POA: Diagnosis not present

## 2023-06-10 DIAGNOSIS — E538 Deficiency of other specified B group vitamins: Secondary | ICD-10-CM

## 2023-06-10 DIAGNOSIS — E785 Hyperlipidemia, unspecified: Secondary | ICD-10-CM

## 2023-06-10 DIAGNOSIS — G720 Drug-induced myopathy: Secondary | ICD-10-CM

## 2023-06-10 DIAGNOSIS — Z794 Long term (current) use of insulin: Secondary | ICD-10-CM

## 2023-06-10 DIAGNOSIS — R3914 Feeling of incomplete bladder emptying: Secondary | ICD-10-CM

## 2023-06-10 LAB — CBC WITH DIFFERENTIAL/PLATELET
Absolute Monocytes: 910 cells/uL (ref 200–950)
Basophils Absolute: 70 cells/uL (ref 0–200)
Basophils Relative: 0.7 %
Eosinophils Absolute: 590 cells/uL — ABNORMAL HIGH (ref 15–500)
Eosinophils Relative: 5.9 %
HCT: 49.4 % (ref 38.5–50.0)
Hemoglobin: 16.5 g/dL (ref 13.2–17.1)
Lymphs Abs: 1890 cells/uL (ref 850–3900)
MCH: 31 pg (ref 27.0–33.0)
MCHC: 33.4 g/dL (ref 32.0–36.0)
MCV: 92.9 fL (ref 80.0–100.0)
MPV: 9.4 fL (ref 7.5–12.5)
Monocytes Relative: 9.1 %
Neutro Abs: 6540 cells/uL (ref 1500–7800)
Neutrophils Relative %: 65.4 %
Platelets: 224 10*3/uL (ref 140–400)
RBC: 5.32 10*6/uL (ref 4.20–5.80)
RDW: 13 % (ref 11.0–15.0)
Total Lymphocyte: 18.9 %
WBC: 10 10*3/uL (ref 3.8–10.8)

## 2023-06-10 MED ORDER — EMPAGLIFLOZIN 25 MG PO TABS
25.0000 mg | ORAL_TABLET | Freq: Every day | ORAL | 3 refills | Status: DC
Start: 2023-06-10 — End: 2024-06-07

## 2023-06-10 NOTE — Assessment & Plan Note (Addendum)
Well-controlled HTN  No known complications    Plan:  1.  Continue current BP regimen Valsartan 80mg  daily  May dial back Valsartan from 80 to 40mg  in future IF developing concerns of low blood pressure, lightheaded dizzy etc, especially with standing.  2. Encourage improved lifestyle - low sodium diet, regular exercise 3. Continue monitor BP outside office, bring readings to next visit, if persistently >140/90 or new symptoms notify office sooner

## 2023-06-10 NOTE — Assessment & Plan Note (Addendum)
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 Continue current meds -Repatha

## 2023-06-10 NOTE — Assessment & Plan Note (Signed)
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

## 2023-06-10 NOTE — Assessment & Plan Note (Addendum)
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

## 2023-06-10 NOTE — Assessment & Plan Note (Signed)
Followed by [facility]

## 2023-06-10 NOTE — Patient Instructions (Addendum)
Thank you for coming to the office today.  Labs today  May dial back Valsartan from 80 to 40mg  in future IF developing concerns of low blood pressure, lightheaded dizzy etc, especially with standing.  Refilled Jardiance 90 day order with refills.  Check into options for [employer], can consider watch or other avenues, let me know if you can locate a product or brand or way for me to sign the order.  Please schedule a Follow-up Appointment to: Return in about 6 months (around 12/11/2023) for 6 month DM A1c ALS updates.  If you have any other questions or concerns, please feel free to call the office or send a message through MyChart. You may also schedule an earlier appointment if necessary.  Additionally, you may be receiving a survey about your experience at our office within a few days to 1 week by e-mail or mail. We value your feedback.  Saralyn Pilar, DO [facility], [place]

## 2023-06-10 NOTE — Assessment & Plan Note (Addendum)
Due A1c Complications - some early peripheral neuropathy, obesity - increases risk of future cardiovascular complications   Plan:  1. Ozempic + Jardiance + Insulin 3 Encourage improved lifestyle - low carb, low sugar diet, reduce portion size, continue improving regular exercise 4 Check CBG, bring log to next visit for review 5. Continue ASA, ARB, OFF Statin now on PCSK9

## 2023-06-10 NOTE — Progress Notes (Signed)
Subjective:    Patient ID: Jonathan Shelton, male    DOB: Apr 30, 1951, 72 y.o.   MRN: [id number]  Jonathan Shelton is a 72 y.o. 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   Hoarse Voice Vocal Cord Paralysis 2-3 months ago severe choking spell, had vocal cord spasm Vocal cord paralysis, with raspy voice ALS specialist recommended ENT consultation for vocal cord   Chronic Rhinosinusitis He has dry nose and bleeding/scabs Persistent post nasal drainage and into throat and causing some productive cough Asking about nasal sprays    ALS Followed by [facility]   Type 2 Diabetes Due for A1c Has been mostly controlled On current medications  Future Life Alert option Fall injury 05/29/23 ED visit, fall injury   B12 deficiency       05/12/2023    2:08 PM 01/14/2023    2:30 PM 04/30/2022    2:51 PM  Depression screen PHQ 2/9  Decreased Interest 0 0 0  Down, Depressed, Hopeless 0 0 0  PHQ - 2 Score 0 0 0  Altered sleeping 0 1 0  Tired, decreased energy 0 1 0  Change in appetite 0 1 0  Feeling bad or failure about yourself  0 0 0  Trouble concentrating 0 0 0  Moving slowly or fidgety/restless 0 3 0  Suicidal thoughts 0 0 0  PHQ-9 Score 0 6 0  Difficult doing work/chores Not difficult at all Not difficult at all Not difficult at all    Social History   Tobacco Use   Smoking status: Former    Current packs/day: 0.00    Types: Cigarettes    Quit date: 1970    Years since quitting: 54.6   Smokeless tobacco: Former    Types: Snuff  Vaping Use   Vaping status: Never Used  Substance Use Topics   Alcohol use: Not Currently   Drug use: Not Currently    Comment: past    Review of Systems Per HPI unless  specifically indicated above     Objective:    BP (!) 118/52   Pulse 68   Ht 5\' 9"  (1.753 m)   Wt 229 lb (103.9 kg)   SpO2 97%   BMI 33.82 kg/m   Wt Readings from Last 3 Encounters:  06/10/23 229 lb (103.9 kg)  05/29/23 229 lb (103.9 kg)  05/12/23 238 lb (108 kg)    Physical Exam    Results for orders placed or performed in visit on 06/10/23  CBC with Differential/Platelet  Result Value Ref Range   WBC 10.0 3.8 - 10.8 Thousand/uL   RBC 5.32 4.20 - 5.80 Million/uL   Hemoglobin 16.5 13.2 - 17.1 g/dL   HCT 40.9 81.1 - 91.4 %   MCV 92.9 80.0 - 100.0 fL   MCH 31.0 27.0 - 33.0 pg   MCHC 33.4 32.0 - 36.0 g/dL   RDW 78.2 95.6 - 21.3 %   Platelets 224 140 - 400 Thousand/uL   MPV 9.4 7.5 - 12.5 fL   Neutro Abs 6,540 1,500 - 7,800 cells/uL   Lymphs Abs 1,890 850 - 3,900 cells/uL   Absolute Monocytes 910 200 - 950 cells/uL   Eosinophils Absolute 590 (H) 15 - 500 cells/uL  Basophils Absolute 70 0 - 200 cells/uL   Neutrophils Relative % 65.4 %   Total Lymphocyte 18.9 %   Monocytes Relative 9.1 %   Eosinophils Relative 5.9 %   Basophils Relative 0.7 %      Assessment & Plan:   Problem List Items Addressed This Visit     ALS (amyotrophic lateral sclerosis) (HCC)    Followed by [facility]      Relevant Orders   COMPLETE METABOLIC PANEL WITH GFR   CBC with Differential/Platelet (Completed)   Benign prostatic hyperplasia with incomplete bladder emptying    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  Continue current BP regimen Valsartan 80mg  daily  May dial back Valsartan from 80 to 40mg  in future IF developing concerns of low blood pressure, lightheaded  dizzy etc, especially with standing.  2. Encourage improved lifestyle - low sodium diet, regular exercise 3. Continue monitor BP outside office, bring readings to next visit, if persistently >140/90 or new symptoms notify office sooner      Relevant Orders   COMPLETE METABOLIC PANEL WITH GFR   CBC with Differential/Platelet (Completed)   Hyperlipidemia associated with type 2 diabetes mellitus (HCC)    Controlled on PCSK9 Failed Statin with myopathy Atorvastatin, Rosuvastatin   Plan: 1. Continue current meds -Repatha      Relevant Medications   empagliflozin (JARDIANCE) 25 MG TABS tablet   Other Relevant Orders   Lipid panel   TSH   Type 2 diabetes mellitus with other specified complication (HCC) - Primary    Due A1c Complications - some early peripheral neuropathy, obesity - increases risk of future cardiovascular complications   Plan:  1. Ozempic + Jardiance + Insulin 3 Encourage improved lifestyle - low carb, low sugar diet, reduce portion size, continue improving regular exercise 4 Check CBG, bring log to next visit for review 5. Continue ASA, ARB, OFF Statin now on PCSK9      Relevant Medications   empagliflozin (JARDIANCE) 25 MG TABS tablet   Other Relevant Orders   Urine microalbumin-creatinine with uACR   Hemoglobin A1c   Other Visit Diagnoses     Vitamin B12 deficiency       Relevant Orders   Vitamin B12       Labs today  Refilled Jardiance 90 day order with refills.  Check into options for [employer], can consider watch or other avenues, let me know if you can locate a product or brand or way for me to sign the order.  Meds ordered this encounter  Medications   empagliflozin (JARDIANCE) 25 MG TABS tablet    Sig: Take 1 tablet (25 mg total) by mouth daily.    Dispense:  90 tablet    Refill:  3    Add future refills      Follow up plan: Return in about 6 months (around 12/11/2023) for 6 month DM A1c ALS updates.   Saralyn Pilar, DO [facility] 06/10/2023, 3:55 PM

## 2023-06-13 LAB — HM DIABETES EYE EXAM

## 2023-06-14 ENCOUNTER — Encounter: Payer: Self-pay | Admitting: Family Medicine

## 2023-06-16 ENCOUNTER — Other Ambulatory Visit: Payer: Self-pay | Admitting: Family Medicine

## 2023-06-16 DIAGNOSIS — Z794 Long term (current) use of insulin: Secondary | ICD-10-CM

## 2023-06-17 NOTE — Telephone Encounter (Signed)
Requested Prescriptions  Pending Prescriptions Disp Refills   metFORMIN (GLUCOPHAGE) 1000 MG tablet [Pharmacy Med Name: METFORMIN HCL 1,000 MG TABLET] 180 tablet 1    Sig: TAKE 1 TABLET (1,000 MG TOTAL) BY MOUTH TWICE A DAY WITH FOOD     Endocrinology:  Diabetes - Biguanides Failed - 06/16/2023  2:05 PM      Failed - CBC within normal limits and completed in the last 12 months    WBC  Date Value Ref Range Status  06/10/2023 10.0 3.8 - 10.8 Thousand/uL Final   RBC  Date Value Ref Range Status  06/10/2023 5.32 4.20 - 5.80 Million/uL Final   Hemoglobin  Date Value Ref Range Status  06/10/2023 16.5 13.2 - 17.1 g/dL Final   HCT  Date Value Ref Range Status  06/10/2023 49.4 38.5 - 50.0 % Final   MCHC  Date Value Ref Range Status  06/10/2023 33.4 32.0 - 36.0 g/dL Final   [facility]  Date Value Ref Range Status  06/10/2023 31.0 27.0 - 33.0 pg Final   MCV  Date Value Ref Range Status  06/10/2023 92.9 80.0 - 100.0 fL Final   No results found for: "PLTCOUNTKUC", "LABPLAT", "POCPLA" RDW  Date Value Ref Range Status  06/10/2023 13.0 11.0 - 15.0 % Final         Passed - Cr in normal range and within 360 days    Creat  Date Value Ref Range Status  06/10/2023 0.88 0.70 - 1.28 mg/dL Final   Creatinine, Urine  Date Value Ref Range Status  06/10/2023 88 20 - 320 mg/dL Final         Passed - HBA1C is between 0 and 7.9 and within 180 days    Hgb A1c MFr Bld  Date Value Ref Range Status  06/10/2023 7.0 (H) <5.7 % of total Hgb Final    Comment:    For someone without known diabetes, a hemoglobin A1c value of 6.5% or greater indicates that they may have  diabetes and this should be confirmed with a follow-up  test. . For someone with known diabetes, a value <7% indicates  that their diabetes is well controlled and a value  greater than or equal to 7% indicates suboptimal  control. A1c targets should be individualized based on  duration of diabetes, age, comorbid conditions, and   other considerations. . Currently, no consensus exists regarding use of hemoglobin A1c for diagnosis of diabetes for children. .          Passed - eGFR in normal range and within 360 days    GFR, Est African American  Date Value Ref Range Status  08/12/2020 112 > OR = 60 mL/min/1.22m2 Final   GFR, Est Non African American  Date Value Ref Range Status  08/12/2020 96 > OR = 60 mL/min/1.59m2 Final   GFR, Estimated  Date Value Ref Range Status  09/01/2021 >60 >60 mL/min Final    Comment:    (NOTE) Calculated using the CKD-EPI Creatinine Equation (2021)    eGFR  Date Value Ref Range Status  06/10/2023 92 > OR = 60 mL/min/1.87m2 Final         Passed - B12 Level in normal range and within 720 days    Vitamin B-12  Date Value Ref Range Status  06/10/2023 754 200 - 1,100 pg/mL Final         Passed - Valid encounter within last 6 months    Recent Outpatient Visits  1 week ago Type 2 diabetes mellitus with other specified complication, with long-term current use of insulin [facility])   [place] [facility] [place], Netta Neat, DO   5 months ago Type 2 diabetes mellitus with other specified complication, with long-term current use of insulin [facility])   [place] [facility] Smitty Cords, DO   1 year ago ALS (amyotrophic lateral sclerosis) [facility])   [place] [facility] Smitty Cords, DO   1 year ago ALS (amyotrophic lateral sclerosis) [facility])   [place] [facility] Smitty Cords, DO   1 year ago COVID-19 virus infection   [place] [facility] [place], Netta Neat, DO       Future Appointments             In 5 months Althea Charon, Netta Neat, DO [place] [facility], [facility]

## 2023-06-21 ENCOUNTER — Other Ambulatory Visit: Payer: Self-pay | Admitting: Family Medicine

## 2023-06-21 DIAGNOSIS — G720 Drug-induced myopathy: Secondary | ICD-10-CM

## 2023-06-21 DIAGNOSIS — I6930 Unspecified sequelae of cerebral infarction: Secondary | ICD-10-CM

## 2023-06-21 DIAGNOSIS — E1169 Type 2 diabetes mellitus with other specified complication: Secondary | ICD-10-CM

## 2023-06-22 NOTE — Telephone Encounter (Signed)
Unable to refill per protocol, Rx expired. Discontinued 08/24/22.  Requested Prescriptions  Pending Prescriptions Disp Refills   PRALUENT 75 MG/ML SOAJ [Pharmacy Med Name: PRALUENT 75 MG/ML PEN]  3    Sig: INJECT 75 MG AS DIRECTED EVERY 14 (FOURTEEN) DAYS.     Cardiovascular: PCSK9 Inhibitors Passed - 06/21/2023 12:18 PM      Passed - Valid encounter within last 12 months    Recent Outpatient Visits           1 week ago Type 2 diabetes mellitus with other specified complication, with long-term current use of insulin [facility])   [place] [facility], Netta Neat, DO   5 months ago Type 2 diabetes mellitus with other specified complication, with long-term current use of insulin [facility])   [place] [facility] [place], Netta Neat, DO   1 year ago ALS (amyotrophic lateral sclerosis) [facility])   [place] [facility] Smitty Cords, DO   1 year ago ALS (amyotrophic lateral sclerosis) [facility])   [place] [facility] Smitty Cords, DO   1 year ago COVID-19 virus infection   [employer] [facility] Althea Charon, Netta Neat, DO       Future Appointments             In 5 months Althea Charon, Netta Neat, DO  [facility], [facility]            Passed - Lipid Panel completed within the last 12 months    Cholesterol  Date Value Ref Range Status  06/10/2023 97 <200 mg/dL Final   LDL Cholesterol (Calc)  Date Value Ref Range Status  06/10/2023 25 mg/dL (calc) Final    Comment:    Reference range: <100 . Desirable range <100 mg/dL for primary prevention;   <70 mg/dL for patients with CHD or diabetic patients  with > or = 2 CHD risk factors. Marland Kitchen LDL-C is now calculated using the Martin-Hopkins  calculation, which is a validated novel method providing  better accuracy than the Friedewald equation in the  estimation of LDL-C.  Horald Pollen et al. Lenox Ahr.  8295;621(30): 2061-2068  (http://education.[employer].com/faq/FAQ164)    HDL  Date Value Ref Range Status  06/10/2023 47 > OR = 40 mg/dL Final   Triglycerides  Date Value Ref Range Status  06/10/2023 173 (H) <150 mg/dL Final

## 2023-06-25 ENCOUNTER — Other Ambulatory Visit: Payer: Self-pay | Admitting: Family Medicine

## 2023-06-25 ENCOUNTER — Encounter: Payer: Self-pay | Admitting: Family Medicine

## 2023-06-25 DIAGNOSIS — N401 Enlarged prostate with lower urinary tract symptoms: Secondary | ICD-10-CM

## 2023-06-27 MED ORDER — TAMSULOSIN HCL 0.4 MG PO CAPS
0.8000 mg | ORAL_CAPSULE | Freq: Every day | ORAL | 1 refills | Status: DC
Start: 2023-06-27 — End: 2024-06-18

## 2023-09-16 ENCOUNTER — Other Ambulatory Visit: Payer: Self-pay | Admitting: Family Medicine

## 2023-09-16 DIAGNOSIS — I10 Essential (primary) hypertension: Secondary | ICD-10-CM

## 2023-09-16 DIAGNOSIS — N401 Enlarged prostate with lower urinary tract symptoms: Secondary | ICD-10-CM

## 2023-09-16 DIAGNOSIS — E1169 Type 2 diabetes mellitus with other specified complication: Secondary | ICD-10-CM

## 2023-09-16 DIAGNOSIS — G720 Drug-induced myopathy: Secondary | ICD-10-CM

## 2023-09-16 DIAGNOSIS — I6930 Unspecified sequelae of cerebral infarction: Secondary | ICD-10-CM

## 2023-09-16 NOTE — Telephone Encounter (Signed)
Requested Prescriptions  Pending Prescriptions Disp Refills   finasteride (PROSCAR) 5 MG tablet [Pharmacy Med Name: FINASTERIDE 5 MG TABLET] 90 tablet 0    Sig: TAKE 1 TABLET (5 MG TOTAL) BY MOUTH DAILY.     Urology: 5-alpha Reductase Inhibitors Passed - 09/16/2023 10:06 AM      Passed - PSA in normal range and within 360 days    PSA  Date Value Ref Range Status  06/10/2023 0.31 < OR = 4.00 ng/mL Final    Comment:    The total PSA value from this assay system is  standardized against the WHO standard. The test  result will be approximately 20% lower when compared  to the equimolar-standardized total PSA (Beckman  Coulter). Comparison of serial PSA results should be  interpreted with this fact in mind. . This test was performed using the Siemens  chemiluminescent method. Values obtained from  different assay methods cannot be used interchangeably. PSA levels, regardless of value, should not be interpreted as absolute evidence of the presence or absence of disease.          Passed - Valid encounter within last 12 months    Recent Outpatient Visits           3 months ago Type 2 diabetes mellitus with other specified complication, with long-term current use of insulin [facility])   [facility] [place], Netta Neat, DO   8 months ago Type 2 diabetes mellitus with other specified complication, with long-term current use of insulin [facility])   [place] [facility] Smitty Cords, DO   1 year ago ALS (amyotrophic lateral sclerosis) [facility])   [place] [facility] Smitty Cords, DO   1 year ago ALS (amyotrophic lateral sclerosis) [facility])   [facility] Smit[facility], DO   2 years ago COVID-19 virus infection   [place] [facility] Althea Charon, Netta Neat, DO       Future Appointments             In 2 months Althea Charon, Netta Neat, DO [place]  [facility]             OZEMPIC, 2 MG/DOSE, 8 MG/3ML SOPN [Pharmacy Med Name: OZEMPIC 8 MG/3 ML (2 MG/DOSE)] 3 mL 0    Sig: INJECT 2 MG INTO THE SKIN ONCE A WEEK.     Endocrinology:  Diabetes - GLP-1 Receptor Agonists - semaglutide Failed - 09/16/2023 10:06 AM      Failed - HBA1C in normal range and within 180 days    Hgb A1c MFr Bld  Date Value Ref Range Status  06/10/2023 7.0 (H) <5.7 % of total Hgb Final    Comment:    For someone without known diabetes, a hemoglobin A1c value of 6.5% or greater indicates that they may have  diabetes and this should be confirmed with a follow-up  test. . For someone with known diabetes, a value <7% indicates  that their diabetes is well controlled and a value  greater than or equal to 7% indicates suboptimal  control. A1c targets should be individualized based on  duration of diabetes, age, comorbid conditions, and  other considerations. . Currently, no consensus exists regarding use of hemoglobin A1c for diagnosis of diabetes for children. .          Passed - Cr in normal range and within 360 days    Creat  Date Value Ref Range Status  06/10/2023 0.88 0.70 - 1.28 mg/dL Final   Creatinine, Urine  Date Value Ref Range Status  06/10/2023 88 20 - 320 mg/dL Final         Passed - Valid encounter within last 6 months    Recent Outpatient Visits           3 months ago Type 2 diabetes mellitus with other specified complication, with long-term current use of insulin (HCC)   [place] [facility] [place], Netta Neat, DO   8 months ago Type 2 diabetes mellitus with other specified complication, with long-term current use of insulin (HCC)   [place] [facility] [place], Netta Neat, DO   1 year ago ALS (amyotrophic lateral sclerosis) [facility])   [place] [facility] Smitty Cords, DO   1 year ago ALS (amyotrophic lateral sclerosis) [facility])   [facility] Smitty Cords, DO   2 years ago COVID-19 virus infection   [place] [facility] [place], Netta Neat, DO       Future Appointments             In 2 months Althea Charon, Netta Neat, DO [place] [facility]             valsartan (DIOVAN) 80 MG tablet [Pharmacy Med Name: VALSARTAN 80 MG TABLET] 90 tablet 0    Sig: TAKE 1 TABLET BY MOUTH EVERY DAY     Cardiovascular:  Angiotensin Receptor Blockers Passed - 09/16/2023 10:06 AM      Passed - Cr in normal range and within 180 days    Creat  Date Value Ref Range Status  06/10/2023 0.88 0.70 - 1.28 mg/dL Final   Creatinine, Urine  Date Value Ref Range Status  06/10/2023 88 20 - 320 mg/dL Final         Passed - K in normal range and within 180 days    Potassium  Date Value Ref Range Status  06/10/2023 4.5 3.5 - 5.3 mmol/L Final         Passed - Patient is not pregnant      Passed - Last BP in normal range    BP Readings from Last 1 Encounters:  06/10/23 (!) 118/52         Passed - Valid encounter within last 6 months    Recent Outpatient Visits           3 months ago Type 2 diabetes mellitus with other specified complication, with long-term current use of insulin (HCC)   [facility] [place], Netta Neat, DO   8 months ago Type 2 diabetes mellitus with other specified complication, with long-term current use of insulin [facility])   [employer] [facility] [place], Netta Neat, DO   1 year ago ALS (amyotrophic lateral sclerosis) [facility])   [place] Smitty Cords, DO   1 year ago ALS (amyotrophic lateral sclerosis) [facility])   [place] [facility] Smitty Cords, DO   2 years ago COVID-19 virus infection   [place] [facility] [place], Netta Neat, DO       Future Appointments             In 2  months Althea Charon, Netta Neat, DO  [facility]  OZEMPIC, 1 MG/DOSE, 4 MG/3ML SOPN [Pharmacy Med Name: OZEMPIC 4 MG/3 ML (1 MG/DOSE)]  1    Sig: INJECT 1 MG ONCE A WEEK AS DIRECTED     Endocrinology:  Diabetes - GLP-1 Receptor Agonists - semaglutide Failed - 09/16/2023 10:06 AM      Failed - HBA1C in normal range and within 180 days    Hgb A1c MFr Bld  Date Value Ref Range Status  06/10/2023 7.0 (H) <5.7 % of total Hgb Final    Comment:    For someone without known diabetes, a hemoglobin A1c value of 6.5% or greater indicates that they may have  diabetes and this should be confirmed with a follow-up  test. . For someone with known diabetes, a value <7% indicates  that their diabetes is well controlled and a value  greater than or equal to 7% indicates suboptimal  control. A1c targets should be individualized based on  duration of diabetes, age, comorbid conditions, and  other considerations. . Currently, no consensus exists regarding use of hemoglobin A1c for diagnosis of diabetes for children. .          Passed - Cr in normal range and within 360 days    Creat  Date Value Ref Range Status  06/10/2023 0.88 0.70 - 1.28 mg/dL Final   Creatinine, Urine  Date Value Ref Range Status  06/10/2023 88 20 - 320 mg/dL Final         Passed - Valid encounter within last 6 months    Recent Outpatient Visits           3 months ago Type 2 diabetes mellitus with other specified complication, with long-term current use of insulin [facility])   [place] [facility] [place], Netta Neat, DO   8 months ago Type 2 diabetes mellitus with other specified complication, with long-term current use of insulin [facility])   [place] [facility] [place], Netta Neat, DO   1 year ago ALS (amyotrophic lateral sclerosis) [facility])   [place] [facility] Smitty Cords, DO   1 year ago ALS  (amyotrophic lateral sclerosis) [facility])   [place] [facility] Smitty Cords, DO   2 years ago COVID-19 virus infection   [place] [facility] [place], Netta Neat, DO       Future Appointments             In 2 months Althea Charon, Netta Neat, DO [place] [facility]             PRALUENT 75 MG/ML SOAJ [Pharmacy Med Name: PRALUENT 75 MG/ML PEN]  3    Sig: INJECT 75 MG AS DIRECTED EVERY 14 (FOURTEEN) DAYS.     Cardiovascular: PCSK9 Inhibitors Passed - 09/16/2023 10:06 AM      Passed - Valid encounter within last 12 months    Recent Outpatient Visits           3 months ago Type 2 diabetes mellitus with other specified complication, with long-term current use of insulin [facility])   [place] [facility] [place], Netta Neat, DO   8 months ago Type 2 diabetes mellitus with other specified complication, with long-term current use of insulin [facility])    [place] Smitty Cords, DO   1 year ago ALS (amyotrophic lateral sclerosis) [facility])    [facility] [place], Netta Neat, [place]  1 year ago ALS (amyotrophic lateral sclerosis) [facility])   [place] [facility] [place], Netta Neat, DO   2 years ago COVID-19 virus infection   [place] [facility] [place], Netta Neat, DO       Future Appointments             In 2 months Althea Charon, Netta Neat, DO  [facility]            Passed - Lipid Panel completed within the last 12 months    Cholesterol  Date Value Ref Range Status  06/10/2023 97 <200 mg/dL Final   LDL Cholesterol (Calc)  Date Value Ref Range Status  06/10/2023 25 mg/dL (calc) Final    Comment:    Reference range: <100 . Desirable range <100 mg/dL for primary prevention;   <70 mg/dL for patients with CHD or diabetic patients  with >  or = 2 CHD risk factors. Marland Kitchen LDL-C is now calculated using the Martin-Hopkins  calculation, which is a validated novel method providing  better accuracy than the Friedewald equation in the  estimation of LDL-C.  Horald Pollen et al. Lenox Ahr. 1610;960(45): 2061-2068  (http://education.[employer].com/faq/FAQ164)    HDL  Date Value Ref Range Status  06/10/2023 47 > OR = 40 mg/dL Final   Triglycerides  Date Value Ref Range Status  06/10/2023 173 (H) <150 mg/dL Final

## 2023-09-21 ENCOUNTER — Other Ambulatory Visit: Payer: Self-pay | Admitting: Family Medicine

## 2023-09-21 DIAGNOSIS — E1169 Type 2 diabetes mellitus with other specified complication: Secondary | ICD-10-CM

## 2023-09-21 MED ORDER — OZEMPIC (2 MG/DOSE) 8 MG/3ML ~~LOC~~ SOPN
2.0000 mg | PEN_INJECTOR | SUBCUTANEOUS | 2 refills | Status: DC
Start: 2023-09-21 — End: 2023-12-01

## 2023-12-01 ENCOUNTER — Other Ambulatory Visit: Payer: Self-pay | Admitting: Family Medicine

## 2023-12-01 ENCOUNTER — Encounter: Payer: Self-pay | Admitting: Family Medicine

## 2023-12-01 DIAGNOSIS — Z794 Long term (current) use of insulin: Secondary | ICD-10-CM

## 2023-12-01 MED ORDER — OZEMPIC (2 MG/DOSE) 8 MG/3ML ~~LOC~~ SOPN: 2.0000 mg | PEN_INJECTOR | SUBCUTANEOUS | 0 refills | Status: DC

## 2023-12-01 NOTE — Telephone Encounter (Signed)
Medication Refill -  Most Recent Primary Care Visit:  Provider: Smitty Cords  Department: [facility]  Visit Type: OFFICE VISIT  Date: 06/10/2023  Medication: OZEMPIC, 2 MG/DOSE, 8 MG/3ML SOPN   Pt picked up prescription for month supply today 12/01/2023. Pt normally gets a 90 day supply at a time. Moving forward the patient would like the prescription, to be filled for a 90 day supply.   Pharmacy will also be reaching out to office with request.  Wife requesting a mychart message be sent once request is completed.   Has the patient contacted their pharmacy? Yes  Is this the correct pharmacy for this prescription? Yes  This is the patient's preferred pharmacy:  [id number] [place], [place] - [address] [place] [address] Phone: [phone] Fax: [phone]   Has the prescription been filled recently? Yes  Is the patient out of the medication? No  Has the patient been seen for an appointment in the last year OR does the patient have an upcoming appointment? Yes  Can we respond through MyChart? Yes  Agent: Please be advised that Rx refills may take up to 3 business days. We ask that you follow-up with your pharmacy.

## 2023-12-01 NOTE — Telephone Encounter (Signed)
Sending 90 DS as requested per protocol  Requested Prescriptions  Pending Prescriptions Disp Refills   OZEMPIC, 2 MG/DOSE, 8 MG/3ML SOPN 3 mL 2    Sig: Inject 2 mg into the skin once a week.     Endocrinology:  Diabetes - GLP-1 Receptor Agonists - semaglutide Failed - 12/01/2023  5:27 PM      Failed - HBA1C in normal range and within 180 days    Hgb A1c MFr Bld  Date Value Ref Range Status  06/10/2023 7.0 (H) <5.7 % of total Hgb Final    Comment:    For someone without known diabetes, a hemoglobin A1c value of 6.5% or greater indicates that they may have  diabetes and this should be confirmed with a follow-up  test. . For someone with known diabetes, a value <7% indicates  that their diabetes is well controlled and a value  greater than or equal to 7% indicates suboptimal  control. A1c targets should be individualized based on  duration of diabetes, age, comorbid conditions, and  other considerations. . Currently, no consensus exists regarding use of hemoglobin A1c for diagnosis of diabetes for children. .          Passed - Cr in normal range and within 360 days    Creat  Date Value Ref Range Status  06/10/2023 0.88 0.70 - 1.28 mg/dL Final   Creatinine, Urine  Date Value Ref Range Status  06/10/2023 88 20 - 320 mg/dL Final         Passed - Valid encounter within last 6 months    Recent Outpatient Visits           5 months ago Type 2 diabetes mellitus with other specified complication, with long-term current use of insulin [facility])   [place] [facility] [place], Netta Neat, DO   10 months ago Type 2 diabetes mellitus with other specified complication, with long-term current use of insulin [facility])   [place] [facility] Smitty Cords, DO   1 year ago ALS (amyotrophic lateral sclerosis) [facility])   [place] [facility] Smitty Cords, DO   1 year ago ALS (amyotrophic lateral sclerosis) [facility])    [place] [facility] Smitty Cords, DO   2 years ago COVID-19 virus infection   [place] [facility] [place], Netta Neat, DO       Future Appointments             In 1 week Althea Charon, Netta Neat, DO [place]

## 2023-12-11 ENCOUNTER — Encounter: Payer: Self-pay | Admitting: Family Medicine

## 2023-12-12 ENCOUNTER — Other Ambulatory Visit: Payer: Self-pay

## 2023-12-12 DIAGNOSIS — Z794 Long term (current) use of insulin: Secondary | ICD-10-CM

## 2023-12-12 MED ORDER — METFORMIN HCL 1000 MG PO TABS
1000.0000 mg | ORAL_TABLET | Freq: Two times a day (BID) | ORAL | 1 refills | Status: DC
Start: 2023-12-12 — End: 2024-06-07

## 2023-12-13 ENCOUNTER — Ambulatory Visit: Payer: Medicare HMO | Admitting: Family Medicine

## 2023-12-14 ENCOUNTER — Other Ambulatory Visit: Payer: Self-pay | Admitting: Family Medicine

## 2023-12-14 ENCOUNTER — Encounter: Payer: Self-pay | Admitting: Family Medicine

## 2023-12-14 ENCOUNTER — Ambulatory Visit (INDEPENDENT_AMBULATORY_CARE_PROVIDER_SITE_OTHER): Payer: Medicare HMO | Admitting: Family Medicine

## 2023-12-14 VITALS — BP 124/60 | HR 88 | Ht 69.0 in | Wt 224.0 lb

## 2023-12-14 DIAGNOSIS — N401 Enlarged prostate with lower urinary tract symptoms: Secondary | ICD-10-CM

## 2023-12-14 DIAGNOSIS — I10 Essential (primary) hypertension: Secondary | ICD-10-CM

## 2023-12-14 DIAGNOSIS — G1221 Amyotrophic lateral sclerosis: Secondary | ICD-10-CM

## 2023-12-14 DIAGNOSIS — Z1211 Encounter for screening for malignant neoplasm of colon: Secondary | ICD-10-CM

## 2023-12-14 DIAGNOSIS — Z794 Long term (current) use of insulin: Secondary | ICD-10-CM

## 2023-12-14 DIAGNOSIS — Z0000 Encounter for general adult medical examination without abnormal findings: Secondary | ICD-10-CM

## 2023-12-14 DIAGNOSIS — E1169 Type 2 diabetes mellitus with other specified complication: Secondary | ICD-10-CM | POA: Diagnosis not present

## 2023-12-14 DIAGNOSIS — J3800 Paralysis of vocal cords and larynx, unspecified: Secondary | ICD-10-CM

## 2023-12-14 DIAGNOSIS — E538 Deficiency of other specified B group vitamins: Secondary | ICD-10-CM

## 2023-12-14 DIAGNOSIS — E785 Hyperlipidemia, unspecified: Secondary | ICD-10-CM

## 2023-12-14 LAB — POCT GLYCOSYLATED HEMOGLOBIN (HGB A1C): Hemoglobin A1C: 5.7 % — AB (ref 4.0–5.6)

## 2023-12-14 NOTE — Patient Instructions (Addendum)
Thank you for coming to the office today.  Recent Labs    01/14/23 1349 06/10/23 1620 12/14/23 1550  HGBA1C 7.1* 7.0* 5.7*   Reduce Insulin dosage as discussed. Due to risk of low sugars overnight. - I recommend taking Insulin dose 25 units at bedtime, if sugar is >200 and if the sugar in evening is >150, can do 12 units.  Keep on Ozempic 2mg  weekly and other medications.  I recommend the updated Pneumonia vaccine Prevnar-20 if interested, last dose 2021. I recommend taking the booster here or at pharmacy.   DUE for FASTING BLOOD WORK (no food or drink after midnight before the lab appointment, only water or coffee without cream/sugar on the morning of)  SCHEDULE "Lab Only" visit in the morning at the clinic for lab draw in 6 MONTHS   - Make sure Lab Only appointment is at about 1 week before your next appointment, so that results will be available  For Lab Results, once available within 2-3 days of blood draw, you can can log in to MyChart online to view your results and a brief explanation. Also, we can discuss results at next follow-up visit.   Please schedule a Follow-up Appointment to: Return for 6 month fasting lab > 1 week later Annual Physical.  If you have any other questions or concerns, please feel free to call the office or send a message through MyChart. You may also schedule an earlier appointment if necessary.  Additionally, you may be receiving a survey about your experience at our office within a few days to 1 week by e-mail or mail. We value your feedback.  Saralyn Pilar, DO [facility], [place]

## 2023-12-14 NOTE — Progress Notes (Signed)
Subjective:    Patient ID: Jonathan Shelton, male    DOB: 06-08-1951, 73 y.o.   MRN: [id number]  Jonathan Shelton is a 73 y.o. male presenting on 12/14/2023 for No chief complaint on file.   HPI  Discussed the use of AI scribe software for clinical note transcription with the patient, who gave verbal consent to proceed.  History of Present Illness   The patient presents for a follow-up regarding diabetes management.  Type 2 Diabetes He is currently managing his diabetes with Ozempic at a dose of 2 mg and metformin, although he forgot to take metformin today. His recent A1c result is 5.7%, indicating good glycemic control.  He has switched to using an arm sensor for glucose monitoring, which he finds inconsistent compared to previous test strips. Glucose readings vary significantly, with one instance showing a discrepancy between 300 and 170 mg/dL after finger pricks.  He experiences nocturnal hypoglycemia, with his sensor alerting him when glucose levels drop below 69 mg/dL. He typically administers 20 to 40 units of insulin at night, depending on glucose readings, but has experienced issues with overcorrecting and causing hypoglycemia. NPH Insulin 20-40 units, 40 at >200 at night, and 20 if >150.  On Ozempic 2mg  weekly, Jardiance 25mg  daily, Metformin 1000mg  TWICE A DAY Insulin NPH 20-40 u as above.   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      05/12/2023    2:08 PM 01/14/2023    2:30 PM 04/30/2022    2:51 PM  Depression screen PHQ 2/9  Decreased Interest 0 0 0  Down, Depressed, Hopeless 0 0 0  PHQ -  2 Score 0 0 0  Altered sleeping 0 1 0  Tired, decreased energy 0 1 0  Change in appetite 0 1 0  Feeling bad or failure about yourself  0 0 0  Trouble concentrating 0 0 0  Moving slowly or fidgety/restless 0 3 0  Suicidal thoughts 0 0 0  PHQ-9 Score 0 6 0  Difficult doing work/chores Not difficult at all Not difficult at all Not difficult at all       01/14/2023    2:30 PM 06/02/2021    1:48 PM  GAD 7 : Generalized Anxiety Score  Nervous, Anxious, on Edge 0 0  Control/stop worrying 0 0  Worry too much - different things 0 0  Trouble relaxing 0 0  Restless 0 0  Easily annoyed or irritable  0  Afraid - awful might happen 0 0  Total GAD 7 Score  0  Anxiety Difficulty Not difficult at all Not difficult at all    Social History   Tobacco Use   Smoking status: Former    Current packs/day: 0.00    Types: Cigarettes    Quit date: 1970    Years since quitting: 55.1   Smokeless tobacco: Former    Types: Snuff  Vaping Use   Vaping status: Never Used  Substance Use Topics   Alcohol use: Not Currently   Drug use: Not Currently    Comment: past    Review of Systems Per  HPI unless specifically indicated above     Objective:    BP 124/60   Pulse 88   Ht 5\' 9"  (1.753 m)   Wt 224 lb (101.6 kg)   SpO2 96%   BMI 33.08 kg/m   Wt Readings from Last 3 Encounters:  12/14/23 224 lb (101.6 kg)  06/10/23 229 lb (103.9 kg)  05/29/23 229 lb (103.9 kg)    Physical Exam Vitals and nursing note reviewed.  Constitutional:      General: He is not in acute distress.    Appearance: He is well-developed. He is not diaphoretic.     Comments: Well-appearing, comfortable, cooperative  HENT:     Head: Normocephalic and atraumatic.     Comments: Hoarse voice Eyes:     General:        Right eye: No discharge.        Left eye: No discharge.     Conjunctiva/sclera: Conjunctivae normal.  Neck:     Thyroid: No thyromegaly.  Cardiovascular:     Rate and Rhythm: Normal rate and regular  rhythm.     Pulses: Normal pulses.     Heart sounds: Normal heart sounds. No murmur heard. Pulmonary:     Effort: Pulmonary effort is normal. No respiratory distress.     Breath sounds: Normal breath sounds. No wheezing or rales.  Musculoskeletal:     Cervical back: Normal range of motion and neck supple.     Comments: Muscle stiffness and some reduced ROM  Lymphadenopathy:     Cervical: No cervical adenopathy.  Skin:    General: Skin is warm and dry.     Findings: No erythema or rash.  Neurological:     Mental Status: He is alert and oriented to person, place, and time. Mental status is at baseline.  Psychiatric:        Behavior: Behavior normal.     Comments: Well groomed, good eye contact, stable now with slowed speech, occasional word finding issue.    Diabetic Foot Exam - Simple   Simple Foot Form Diabetic Foot exam was performed with the following findings: Yes 12/14/2023  3:55 PM  Visual Inspection No deformities, no ulcerations, no other skin breakdown bilaterally: Yes Sensation Testing Intact to touch and monofilament testing bilaterally: Yes Pulse Check Posterior Tibialis and Dorsalis pulse intact bilaterally: Yes Comments      Recent Labs    01/14/23 1349 06/10/23 1620 12/14/23 1550  HGBA1C 7.1* 7.0* 5.7*    Results for orders placed or performed in visit on 12/14/23  POCT HgB A1C   Collection Time: 12/14/23  3:50 PM  Result Value Ref Range   Hemoglobin A1C 5.7 (A) 4.0 - 5.6 %   HbA1c POC (<> result, manual entry)     HbA1c, POC (prediabetic range)     HbA1c, POC (controlled diabetic range)        Assessment & Plan:   Problem List Items Addressed This Visit     ALS (amyotrophic lateral sclerosis) (HCC)   Essential hypertension   Type 2 diabetes mellitus with other specified complication (HCC) - Primary   Relevant Orders   POCT HgB A1C (Completed)   Other Visit Diagnoses       Screening for colon cancer       Relevant Orders   Ambulatory  referral to Gastroenterology     Vocal cord paralysis             Type 2 Diabetes Mellitus Well controlled with recent  A1C of 5.7. Patient is on Ozempic 2mg  weekly and Metformin. Patient has been experiencing some hypoglycemic episodes at night due to insulin dosing. -Continue CGM monitoring with alarms set -Reduce insulin dosing NPH from 40 to 25 units at bedtime if blood glucose >200, and from 20 to 12 units if blood glucose >150. We agreed to reduce dosing given how well controlled it has been. I encouraged that we should limit insulin even further in future. -Offer GVOKE Glucagon device, he declines due to his preference. Explained that BY MOUTH intake of sugar with hypoglycemia works but has more opportunity for problems or ineffective results. He will consider the GVOKE -Continue Ozempic 2mg  weekly and Metformin and Jardiance 25mg  daily  ALS Vocal Cord Paralysis  Hypertension Controlled Continue current therapy  General Health Maintenance -Schedule colonoscopy for 2025 with doctor in [place]. -Consider updated pneumonia vaccine (Prevnar 20). -Schedule annual physical for late summer 2025.         Orders Placed This Encounter  Procedures   Ambulatory referral to Gastroenterology    Referral Priority:   Routine    Referral Type:   Consultation    Referral Reason:   Specialty Services Required    Number of Visits Requested:   1   POCT HgB A1C    No orders of the defined types were placed in this encounter.   Follow up plan: Return for 6 month fasting lab > 1 week later Annual Physical.  Future labs ordered for 06/06/24   Saralyn Pilar, DO [facility] 12/14/2023, 3:49 PM

## 2023-12-15 NOTE — Telephone Encounter (Signed)
Requested Prescriptions  Pending Prescriptions Disp Refills   valsartan (DIOVAN) 80 MG tablet [Pharmacy Med Name: VALSARTAN 80 MG TABLET] 90 tablet 0    Sig: TAKE 1 TABLET BY MOUTH EVERY DAY     Cardiovascular:  Angiotensin Receptor Blockers Failed - 12/15/2023  2:09 PM      Failed - Cr in normal range and within 180 days    Creat  Date Value Ref Range Status  06/10/2023 0.88 0.70 - 1.28 mg/dL Final   Creatinine, Urine  Date Value Ref Range Status  06/10/2023 88 20 - 320 mg/dL Final         Failed - K in normal range and within 180 days    Potassium  Date Value Ref Range Status  06/10/2023 4.5 3.5 - 5.3 mmol/L Final         Passed - Patient is not pregnant      Passed - Last BP in normal range    BP Readings from Last 1 Encounters:  12/14/23 124/60         Passed - Valid encounter within last 6 months    Recent Outpatient Visits           Yesterday Type 2 diabetes mellitus with other specified complication, with long-term current use of insulin [facility])   [place] [facility] [place], Netta Neat, DO   6 months ago Type 2 diabetes mellitus with other specified complication, with long-term current use of insulin [facility])   [place] [facility] [place], Netta Neat, DO   11 months ago Type 2 diabetes mellitus with other specified complication, with long-term current use of insulin [facility])   [place] [facility] [place], Netta Neat, DO   1 year ago ALS (amyotrophic lateral sclerosis) [facility])   [place] [facility] Smitty Cords, DO   2 years ago ALS (amyotrophic lateral sclerosis) [facility])   [place] [facility] Smitty Cords, DO       Future Appointments             In 6 months Althea Charon, Netta Neat, DO [place] [facility], [facility]             finasteride (PROSCAR) 5 MG tablet [Pharmacy Med Name: FINASTERIDE 5 MG TABLET] 90  tablet 0    Sig: TAKE 1 TABLET (5 MG TOTAL) BY MOUTH DAILY.     Urology: 5-alpha Reductase Inhibitors Passed - 12/15/2023  2:09 PM      Passed - PSA in normal range and within 360 days    PSA  Date Value Ref Range Status  06/10/2023 0.31 < OR = 4.00 ng/mL Final    Comment:    The total PSA value from this assay system is  standardized against the WHO standard. The test  result will be approximately 20% lower when compared  to the equimolar-standardized total PSA (Beckman  Coulter). Comparison of serial PSA results should be  interpreted with this fact in mind. . This test was performed using the Siemens  chemiluminescent method. Values obtained from  different assay methods cannot be used interchangeably. PSA levels, regardless of value, should not be interpreted as absolute evidence of the presence or absence of disease.          Passed - Valid encounter within last 12 months    Recent Outpatient Visits           Yesterday Type 2 diabetes  mellitus with other specified complication, with long-term current use of insulin [facility])   [place] [facility], Netta Neat, DO   6 months ago Type 2 diabetes mellitus with other specified complication, with long-term current use of insulin [facility])   [place] [facility] Smitty Cords, DO   11 months ago Type 2 diabetes mellitus with other specified complication, with long-term current use of insulin [facility])   [place] [facility] Smitty Cords, DO   1 year ago ALS (amyotrophic lateral sclerosis) [facility])   [place] [facility] Smitty Cords, DO   2 years ago ALS (amyotrophic lateral sclerosis) [facility])   [place] [facility] Smitty Cords, DO       Future Appointments             In 6 months Althea Charon, Netta Neat, DO  [facility], [facility]

## 2024-01-04 ENCOUNTER — Encounter: Payer: Self-pay | Admitting: Family Medicine

## 2024-01-10 ENCOUNTER — Encounter: Payer: Self-pay | Admitting: Family Medicine

## 2024-01-10 ENCOUNTER — Other Ambulatory Visit: Payer: Self-pay | Admitting: Family Medicine

## 2024-01-10 ENCOUNTER — Ambulatory Visit: Payer: Medicare HMO | Admitting: Family Medicine

## 2024-01-10 VITALS — BP 120/70 | HR 95 | Ht 69.0 in

## 2024-01-10 DIAGNOSIS — J9801 Acute bronchospasm: Secondary | ICD-10-CM | POA: Diagnosis not present

## 2024-01-10 DIAGNOSIS — J40 Bronchitis, not specified as acute or chronic: Secondary | ICD-10-CM

## 2024-01-10 MED ORDER — AZITHROMYCIN 250 MG PO TABS: ORAL_TABLET | ORAL | 0 refills | Status: DC

## 2024-01-10 MED ORDER — LEVALBUTEROL TARTRATE 45 MCG/ACT IN AERO: 1.0000 | INHALATION_SPRAY | Freq: Four times a day (QID) | RESPIRATORY_TRACT | 2 refills | Status: DC | PRN

## 2024-01-10 NOTE — Progress Notes (Signed)
 Subjective:    Patient ID: Jonathan Shelton, male    DOB: 29-Jan-1951, 73 y.o.   MRN: [id number]  Jonathan Shelton is a 73 y.o. male presenting on 01/10/2024 for Cough  Patient presents for a same day appointment.   HPI  Discussed the use of AI scribe software for clinical note transcription with the patient, who gave verbal consent to proceed.  History of Present Illness   Jonathan Shelton is a 73 year old male who presents with breathing difficulties and cough following a bronchial infection.  He has been experiencing breathing difficulties and a persistent cough following a bronchial infection that began approximately one month ago. He has difficulty expectorating mucus, stating that when trying to cough, he can't get any of the mucus up. The symptoms became particularly bothersome starting yesterday. No fever, chills, or other systemic symptoms are present, but congestion sometimes extends to his head. He has a history of wheezing and coarse breathing, which he attributes to tightness in his chest.  He has a history of using cephalexin for respiratory issues, although he acknowledges it is typically used for urinary infections. He has previously used azithromycin (Z-Pak) for similar respiratory conditions. He has a history of using a Xopenex (levalbuterol) inhaler, although the current prescription is a couple of years old. He is unsure if he needs a new order but acknowledges that the potency may have declined. He mentions a past history of COVID-19 years ago but denies any recent infections.  He discusses a previous experience with valsartan, which he discontinued due to back pain and the presence of 'crystal' sediment in his urine. He notes that these symptoms resolved after stopping the medication.         05/12/2023    2:08 PM 01/14/2023    2:30 PM 04/30/2022    2:51 PM  Depression screen PHQ 2/9  Decreased Interest 0 0 0  Down, Depressed, Hopeless 0 0 0  PHQ - 2 Score 0 0 0   Altered sleeping 0 1 0  Tired, decreased energy 0 1 0  Change in appetite 0 1 0  Feeling bad or failure about yourself  0 0 0  Trouble concentrating 0 0 0  Moving slowly or fidgety/restless 0 3 0  Suicidal thoughts 0 0 0  PHQ-9 Score 0 6 0  Difficult doing work/chores Not difficult at all Not difficult at all Not difficult at all       01/14/2023    2:30 PM 06/02/2021    1:48 PM  GAD 7 : Generalized Anxiety Score  Nervous, Anxious, on Edge 0 0  Control/stop worrying 0 0  Worry too much - different things 0 0  Trouble relaxing 0 0  Restless 0 0  Easily annoyed or irritable  0  Afraid - awful might happen 0 0  Total GAD 7 Score  0  Anxiety Difficulty Not difficult at all Not difficult at all    Social History   Tobacco Use   Smoking status: Former    Current packs/day: 0.00    Types: Cigarettes    Quit date: 1970    Years since quitting: 55.1   Smokeless tobacco: Former    Types: Snuff  Vaping Use   Vaping status: Never Used  Substance Use Topics   Alcohol use: Not Currently   Drug use: Not Currently    Comment: past    Review of Systems Per HPI unless specifically indicated above     Objective:  BP 120/70   Pulse 95   Ht 5\' 9"  (1.753 m)   SpO2 96%   BMI 33.08 kg/m   Wt Readings from Last 3 Encounters:  12/14/23 224 lb (101.6 kg)  06/10/23 229 lb (103.9 kg)  05/29/23 229 lb (103.9 kg)    Physical Exam Vitals and nursing note reviewed.  Constitutional:      General: He is not in acute distress.    Appearance: Normal appearance. He is well-developed. He is not diaphoretic.     Comments: Well-appearing, comfortable, cooperative  HENT:     Head: Normocephalic and atraumatic.  Eyes:     General:        Right eye: No discharge.        Left eye: No discharge.     Conjunctiva/sclera: Conjunctivae normal.  Cardiovascular:     Rate and Rhythm: Normal rate.  Pulmonary:     Effort: Pulmonary effort is normal. No respiratory distress.     Breath  sounds: Wheezing present. No rhonchi or rales.     Comments: Mild coarse breath sound. Cough Skin:    General: Skin is warm and dry.     Findings: No erythema or rash.  Neurological:     Mental Status: He is alert and oriented to person, place, and time.  Psychiatric:        Mood and Affect: Mood normal.        Behavior: Behavior normal.        Thought Content: Thought content normal.     Comments: Well groomed, good eye contact, normal speech and thoughts     Results for orders placed or performed in visit on 12/14/23  POCT HgB A1C   Collection Time: 12/14/23  3:50 PM  Result Value Ref Range   Hemoglobin A1C 5.7 (A) 4.0 - 5.6 %   HbA1c POC (<> result, manual entry)     HbA1c, POC (prediabetic range)     HbA1c, POC (controlled diabetic range)        Assessment & Plan:   Problem List Items Addressed This Visit   None Visit Diagnoses       Bronchitis    -  Primary   Relevant Medications   levalbuterol (XOPENEX HFA) 45 MCG/ACT inhaler   azithromycin (ZITHROMAX Z-PAK) 250 MG tablet     Cough due to bronchospasm            Bronchitis / Cough Recent onset of cough and congestion, with difficulty expectorating. Coarse breathing and wheezing noted on examination. No fever or chills reported.  -Prescribe Z-Pak (azithromycin) for 5 days. -Rx Xopenex (levalbuterol) inhaler to aid in expectoration and relieve wheezing -Advise use of Mucinex to aid in mucus clearance.  Offer Prednisone but he cannot tolerate this.  Valsartan Use Patient reported back pain and urinary sediment while on Valsartan, which resolved after discontinuation. No current need for renal protection. BP controlled off med -Continue to hold Valsartan and monitor for recurrence of symptoms.  Follow-up Monitor for worsening or persistence of respiratory symptoms. If needed, consider alternative antibiotics or additional treatments.         No orders of the defined types were placed in this  encounter.   Meds ordered this encounter  Medications   levalbuterol (XOPENEX HFA) 45 MCG/ACT inhaler    Sig: Inhale 1-2 puffs into the lungs every 6 (six) hours as needed for wheezing.    Dispense:  15 g    Refill:  2   azithromycin (ZITHROMAX  Z-PAK) 250 MG tablet    Sig: Take 2 tabs (500mg  total) on Day 1. Take 1 tab (250mg ) daily for next 4 days.    Dispense:  6 tablet    Refill:  0    Follow up plan: Return if symptoms worsen or fail to improve.  Saralyn Pilar, DO [facility] 01/10/2024, 4:12 PM

## 2024-01-10 NOTE — Patient Instructions (Addendum)
 Thank you for coming to the office today.  Start Azithromycin Z pak (antibiotic) 2 tabs day 1, then 1 tab x 4 days, complete entire course even if improved  Use Levoalbuterol inhaler as needed.  Please schedule a Follow-up Appointment to: Return if symptoms worsen or fail to improve.  If you have any other questions or concerns, please feel free to call the office or send a message through MyChart. You may also schedule an earlier appointment if necessary.  Additionally, you may be receiving a survey about your experience at our office within a few days to 1 week by e-mail or mail. We value your feedback.  Saralyn Pilar, DO [facility], [place]

## 2024-01-11 ENCOUNTER — Ambulatory Visit: Payer: Medicare HMO | Admitting: Family Medicine

## 2024-01-12 ENCOUNTER — Encounter: Payer: Self-pay | Admitting: *Deleted

## 2024-01-19 ENCOUNTER — Other Ambulatory Visit: Payer: Self-pay | Admitting: Family Medicine

## 2024-01-19 DIAGNOSIS — E538 Deficiency of other specified B group vitamins: Secondary | ICD-10-CM

## 2024-01-19 NOTE — Telephone Encounter (Signed)
 Requested Prescriptions  Pending Prescriptions Disp Refills   cyanocobalamin (VITAMIN B12) 1000 MCG/ML injection [Pharmacy Med Name: CYANOCOBALAMIN 1,000 MCG/ML VL] 3 mL 1    Sig: INJECT 1 ML (1,000 MCG TOTAL) INTO THE MUSCLE EVERY 30 DAYS.     Endocrinology:  Vitamins - Vitamin B12 Passed - 01/19/2024  3:29 PM      Passed - HCT in normal range and within 360 days    HCT  Date Value Ref Range Status  06/10/2023 49.4 38.5 - 50.0 % Final         Passed - HGB in normal range and within 360 days    Hemoglobin  Date Value Ref Range Status  06/10/2023 16.5 13.2 - 17.1 g/dL Final         Passed - B12 Level in normal range and within 360 days    Vitamin B-12  Date Value Ref Range Status  06/10/2023 754 200 - 1,100 pg/mL Final         Passed - Valid encounter within last 12 months    Recent Outpatient Visits           1 month ago Type 2 diabetes mellitus with other specified complication, with long-term current use of insulin [facility])   [place] [facility] [place], Netta Neat, DO   7 months ago Type 2 diabetes mellitus with other specified complication, with long-term current use of insulin [facility])   [place] [facility], Netta Neat, DO   1 year ago Type 2 diabetes mellitus with other specified complication, with long-term current use of insulin [facility])   [place] [facility] Smitty Cords, DO   1 year ago ALS (amyotrophic lateral sclerosis) [facility])   [place] [facility] Smitty Cords, DO   2 years ago ALS (amyotrophic lateral sclerosis) [facility])   [place] [facility] Smitty Cords, DO       Future Appointments             In 5 months Althea Charon, Netta Neat, DO [place] [facility], [facility]

## 2024-02-11 ENCOUNTER — Other Ambulatory Visit: Payer: Self-pay | Admitting: Family Medicine

## 2024-02-11 DIAGNOSIS — J40 Bronchitis, not specified as acute or chronic: Secondary | ICD-10-CM

## 2024-02-14 NOTE — Telephone Encounter (Signed)
 Requested medication (s) are due for refill today: yes  Requested medication (s) are on the active medication list: yes  Last refill:  01/11/24 #8g 0 refills  Future visit scheduled: yes in 4 months  Notes to clinic:  no refills remain. Do you want to refill for 4 months?     Requested Prescriptions  Pending Prescriptions Disp Refills   albuterol (VENTOLIN HFA) 108 (90 Base) MCG/ACT inhaler [Pharmacy Med Name: ALBUTEROL HFA (VENTOLIN) INH] 18 each     Sig: INHALE 1-2 PUFFS INTO THE LUNGS EVERY 4 HOURS AS NEEDED FOR WHEEZING OR SHORTNESS OF BREATH.     Pulmonology:  Beta Agonists 2 Passed - 02/14/2024  8:30 AM      Passed - Last BP in normal range    BP Readings from Last 1 Encounters:  01/10/24 120/70         Passed - Last Heart Rate in normal range    Pulse Readings from Last 1 Encounters:  01/10/24 95         Passed - Valid encounter within last 12 months    Recent Outpatient Visits           1 month ago Bronchitis   [place] [place], Netta Neat, DO       Future Appointments             In 4 months Althea Charon, Netta Neat, DO [place] [facility], [facility]

## 2024-02-15 ENCOUNTER — Other Ambulatory Visit: Payer: Self-pay | Admitting: Family Medicine

## 2024-02-15 DIAGNOSIS — E1169 Type 2 diabetes mellitus with other specified complication: Secondary | ICD-10-CM

## 2024-02-16 NOTE — Telephone Encounter (Signed)
 Requested Prescriptions  Pending Prescriptions Disp Refills   insulin NPH Human (NOVOLIN N) 100 UNIT/ML injection [Pharmacy Med Name: NOVOLIN N 100 UNIT/ML VIAL] 40 mL 0    Sig: INJECT 40 UNITS (0.4 ML) INTO THE SKIN AT BEDTIME. MAY USE REDUCED DOSE AS ADVISED.(NOT COVERED)     Endocrinology:  Diabetes - Insulins Passed - 02/16/2024  1:58 PM      Passed - HBA1C is between 0 and 7.9 and within 180 days    Hemoglobin A1C  Date Value Ref Range Status  12/14/2023 5.7 (A) 4.0 - 5.6 % Final   Hgb A1c MFr Bld  Date Value Ref Range Status  06/10/2023 7.0 (H) <5.7 % of total Hgb Final    Comment:    For someone without known diabetes, a hemoglobin A1c value of 6.5% or greater indicates that they may have  diabetes and this should be confirmed with a follow-up  test. . For someone with known diabetes, a value <7% indicates  that their diabetes is well controlled and a value  greater than or equal to 7% indicates suboptimal  control. A1c targets should be individualized based on  duration of diabetes, age, comorbid conditions, and  other considerations. . Currently, no consensus exists regarding use of hemoglobin A1c for diagnosis of diabetes for children. Verna Czech - Valid encounter within last 6 months    Recent Outpatient Visits           1 month ago Bronchitis   [place] [facility] [place], Netta Neat, DO       Future Appointments             In 4 months Althea Charon, Netta Neat, DO [place] [facility], [facility]

## 2024-02-19 ENCOUNTER — Other Ambulatory Visit: Payer: Self-pay

## 2024-02-19 ENCOUNTER — Encounter: Payer: Self-pay | Admitting: Emergency Medicine

## 2024-02-19 ENCOUNTER — Emergency Department

## 2024-02-19 ENCOUNTER — Emergency Department
Admission: EM | Admit: 2024-02-19 | Discharge: 2024-02-19 | Disposition: A | Discharge status: Home / Self Care | Attending: Emergency Medicine | Admitting: Emergency Medicine

## 2024-02-19 DIAGNOSIS — S0101XA Laceration without foreign body of scalp, initial encounter: Secondary | ICD-10-CM | POA: Insufficient documentation

## 2024-02-19 DIAGNOSIS — Y92810 Car as the place of occurrence of the external cause: Secondary | ICD-10-CM | POA: Diagnosis not present

## 2024-02-19 DIAGNOSIS — W01198A Fall on same level from slipping, tripping and stumbling with subsequent striking against other object, initial encounter: Secondary | ICD-10-CM | POA: Diagnosis not present

## 2024-02-19 DIAGNOSIS — E119 Type 2 diabetes mellitus without complications: Secondary | ICD-10-CM | POA: Insufficient documentation

## 2024-02-19 DIAGNOSIS — S0990XA Unspecified injury of head, initial encounter: Secondary | ICD-10-CM | POA: Diagnosis present

## 2024-02-19 MED ORDER — LIDOCAINE-EPINEPHRINE-TETRACAINE (LET) TOPICAL GEL
3.0000 mL | Freq: Once | TOPICAL | Status: AC
  Administered 2024-02-19: 3 mL via TOPICAL
  Filled 2024-02-19: qty 3

## 2024-02-19 MED ORDER — LIDOCAINE HCL (PF) 1 % IJ SOLN
5.0000 mL | Freq: Once | INTRAMUSCULAR | Status: AC
  Administered 2024-02-19: 5 mL via INTRADERMAL
  Filled 2024-02-19: qty 5

## 2024-02-19 NOTE — ED Notes (Signed)
 Patient transported to CT

## 2024-02-19 NOTE — ED Triage Notes (Signed)
 Pt in from home via [facility] after fall resulting in head injury. Per wife, pt was getting some mulch out of his car and fell backwards, landed onto posterior head and sustained lac to the area. No thinners or LOC reported. Pt arrives with gauze and ACE wrap applied. Pt denies any blurred vision, nausea or confusion, speech is baseline with hx of ALS. Denies any neck pain, does have small abrasion to L elbow

## 2024-02-19 NOTE — Discharge Instructions (Signed)
 CT imaging of your head and neck was normal today.  Staples will need to be removed in 7 days.  This can be done by your primary care provider, urgent care or the emergency department.  You can wash your hair like normal.  Just be careful when drying your hair and combing your hair that you do not catch the staples.  Watch for signs of infection including redness, warmth, swelling, pain and pus drainage.  If you develop any of these please return to the ED, urgent care or your primary care provider.  Return to the emergency department with any worsening symptoms.

## 2024-02-19 NOTE — ED Provider Notes (Cosign Needed Addendum)
 [facility] Provider Note    Event Date/Time   First MD Initiated Contact with Patient 02/19/24 2118     (approximate)   History   No chief complaint on file.   HPI  Jonathan Shelton is a 73 y.o. male with PMH of ALS, diabetes who presents for evaluation after fall.  Patient states that he was getting some out of his car and fell backwards hitting his head on the concrete.  No LOC, no nausea no blood thinners.  Patient states he often falls frequently as result of his ALS.      Physical Exam   Triage Vital Signs: ED Triage Vitals  Encounter Vitals Group     BP 02/19/24 2022 (!) 141/84     Systolic BP Percentile --      Diastolic BP Percentile --      Pulse Rate 02/19/24 2022 83     Resp 02/19/24 2022 18     Temp 02/19/24 2022 98.2 F (36.8 C)     Temp src --      SpO2 02/19/24 2022 96 %     Weight 02/19/24 2025 223 lb 15.8 oz (101.6 kg)     Height --      Head Circumference --      Peak Flow --      Pain Score 02/19/24 2025 7     Pain Loc --      Pain Education --      Exclude from Growth Chart --     Most recent vital signs: Vitals:   02/19/24 2022 02/19/24 2323  BP: (!) 141/84 128/74  Pulse: 83 84  Resp: 18 18  Temp: 98.2 F (36.8 C)   SpO2: 96% 96%    General: Awake, no distress.  CV:  Good peripheral perfusion.  RRR. Resp:  Normal effort.  CTAB. Abd:  No distention.  Other:  No focal neurodeficits, PERRL, EOM intact.  Approximately 3 cm laceration to the posterior left scalp with overlying abrasion, bleeding is controlled.   ED Results / Procedures / Treatments   Labs (all labs ordered are listed, but only abnormal results are displayed) Labs Reviewed - No data to display   RADIOLOGY  CT of the head and cervical spine obtained, interpreted the images as well as reviewed the radiologist report which was negative for any acute abnormalities aside from the left parietal scalp hematoma.   PROCEDURES:  Critical Care  performed: No  .Laceration Repair  Date/Time: 02/19/2024 11:44 PM  Performed by: Cameron Ali, PA-C Authorized by: Cameron Ali, PA-C   Consent:    Consent obtained:  Verbal   Consent given by:  Patient   Risks, benefits, and alternatives were discussed: yes     Risks discussed:  Infection, pain, poor cosmetic result and poor wound healing   Alternatives discussed:  No treatment Universal protocol:    Patient identity confirmed:  Verbally with patient Anesthesia:    Anesthesia method:  Topical application and local infiltration   Topical anesthetic:  LET   Local anesthetic:  Lidocaine 1% w/o epi Laceration details:    Location:  Scalp   Scalp location:  L parietal   Length (cm):  3   Depth (mm):  2 Exploration:    Hemostasis achieved with:  Direct pressure and LET   Wound exploration: entire depth of wound visualized   Treatment:    Area cleansed with:  Povidone-iodine   Amount of cleaning:  Standard  Irrigation solution:  Sterile saline and tap water   Irrigation method:  Tap and syringe Skin repair:    Repair method:  Staples   Number of staples:  6 Approximation:    Approximation:  Close Repair type:    Repair type:  Simple Post-procedure details:    Dressing:  Open (no dressing)   Procedure completion:  Tolerated well, no immediate complications    MEDICATIONS ORDERED IN ED: Medications  lidocaine (PF) (XYLOCAINE) 1 % injection 5 mL (5 mLs Intradermal Given by Other 02/19/24 2253)  lidocaine-EPINEPHrine-tetracaine (LET) topical gel (3 mLs Topical Given 02/19/24 2253)     IMPRESSION / MDM / ASSESSMENT AND PLAN / ED COURSE  I reviewed the triage vital signs and the nursing notes.                             73 year old male presents for evaluation of head injury after a fall.  Blood pressure is elevated otherwise vital signs are stable.  Patient NAD on exam.  Differential diagnosis includes, but is not limited to, intracranial bleed, skull  fracture, laceration, abrasion, neck fracture.  Patient's presentation is most consistent with acute complicated illness / injury requiring diagnostic workup.  CT of the head and neck was negative for acute abnormalities.  No focal neurodeficits on exam.  I feel patient is stable for outpatient management.  Laceration repaired as described in the procedure note above.  Patient was instructed on wound care.  Staples will need to be removed in 7 days.  Patient and wife voiced understanding, all questions were answered and he was stable at discharge.    FINAL CLINICAL IMPRESSION(S) / ED DIAGNOSES   Final diagnoses:  Laceration of scalp, initial encounter     Rx / DC Orders   ED Discharge Orders     None        Note:  This document was prepared using Dragon voice recognition software and may include unintentional dictation errors.   Cameron Ali, PA-C 02/19/24 2328    Cameron Ali, PA-C 02/19/24 2346    Janith Lima, MD 02/20/24 [phone]

## 2024-02-19 NOTE — ED Notes (Signed)
 Pt [facility]IB [facility], hx ALS, pt had a mechanical fall while unloading mulch out the back of car, lost his balance and fell, hitting the back of his head, pt has a laceration to posterior head. NOT ON THINNERS, no LOC.  EMS vitals  GCS 15 113 cbg 97.8 T 86 HR 96% RA 142/87

## 2024-02-20 ENCOUNTER — Telehealth: Payer: Self-pay

## 2024-02-20 NOTE — Telephone Encounter (Signed)
 Patient notified, appt scheduled. Verbal understanding.

## 2024-02-20 NOTE — Telephone Encounter (Signed)
 We do remove staples here. Especially if it is just from a laceration repair done at ED. As was the case here, he was seen yesterday 02/19/24 at [facility] for laceration on scalp and received 6 staples.  We can remove them within 7 to 10 days of the date they were placed. He can be scheduled w/ me for staple removal. We have the disposable staple remover equipment that we can use.  If a patient has a more in-depth surgery, we often ask that they return to their surgeon. But this case we should be able to handle it.  Saralyn Pilar, DO [facility] 02/20/2024, 4:18 PM

## 2024-02-20 NOTE — Telephone Encounter (Signed)
 Copied from CRM [id number]. Topic: Clinical - Medical Advice >> Feb 20, 2024  3:29 PM Shon Hale wrote: Reason for CRM: Patient inquiring if there is anyone that is recommended for staple removal. Per [facility] neither Dr. Althea Charon or Nicki Reaper, NP do staple removal. Patient needing staples removed next Monday, 02/27/2024.   Please reach out to patient on mobile number w/ reccomendation

## 2024-02-23 ENCOUNTER — Other Ambulatory Visit: Payer: Self-pay | Admitting: Family Medicine

## 2024-02-23 DIAGNOSIS — Z794 Long term (current) use of insulin: Secondary | ICD-10-CM

## 2024-02-23 NOTE — Telephone Encounter (Signed)
 Requested Prescriptions  Pending Prescriptions Disp Refills   OZEMPIC, 1 MG/DOSE, 4 MG/3ML SOPN [Pharmacy Med Name: OZEMPIC 4 MG/3 ML (1 MG/DOSE)] 9 mL 0    Sig: INJECT 1MG  INTO THE SKIN ONCE A WEEK     Endocrinology:  Diabetes - GLP-1 Receptor Agonists - semaglutide Failed - 02/23/2024  4:10 PM      Failed - HBA1C in normal range and within 180 days    Hemoglobin A1C  Date Value Ref Range Status  12/14/2023 5.7 (A) 4.0 - 5.6 % Final   Hgb A1c MFr Bld  Date Value Ref Range Status  06/10/2023 7.0 (H) <5.7 % of total Hgb Final    Comment:    For someone without known diabetes, a hemoglobin A1c value of 6.5% or greater indicates that they may have  diabetes and this should be confirmed with a follow-up  test. . For someone with known diabetes, a value <7% indicates  that their diabetes is well controlled and a value  greater than or equal to 7% indicates suboptimal  control. A1c targets should be individualized based on  duration of diabetes, age, comorbid conditions, and  other considerations. . Currently, no consensus exists regarding use of hemoglobin A1c for diagnosis of diabetes for children. .          Passed - Cr in normal range and within 360 days    Creat  Date Value Ref Range Status  06/10/2023 0.88 0.70 - 1.28 mg/dL Final   Creatinine, Urine  Date Value Ref Range Status  06/10/2023 88 20 - 320 mg/dL Final         Passed - Valid encounter within last 6 months    Recent Outpatient Visits           1 month ago Bronchitis   [place] [place], Netta Neat, DO       Future Appointments             In 3 months Althea Charon, Netta Neat, DO [facility], [facility]

## 2024-02-28 ENCOUNTER — Encounter: Payer: Self-pay | Admitting: Family Medicine

## 2024-02-28 ENCOUNTER — Ambulatory Visit: Admitting: Family Medicine

## 2024-02-28 VITALS — BP 138/68 | HR 80 | Ht 69.0 in

## 2024-02-28 DIAGNOSIS — S0101XA Laceration without foreign body of scalp, initial encounter: Secondary | ICD-10-CM

## 2024-02-28 DIAGNOSIS — Z4802 Encounter for removal of sutures: Secondary | ICD-10-CM

## 2024-02-28 DIAGNOSIS — R519 Headache, unspecified: Secondary | ICD-10-CM

## 2024-02-28 MED ORDER — OZEMPIC (2 MG/DOSE) 8 MG/3ML ~~LOC~~ SOPN: 2.0000 mg | PEN_INJECTOR | SUBCUTANEOUS | 1 refills | Status: DC

## 2024-02-28 NOTE — Addendum Note (Signed)
 Addended by: Raina Bunting on: 02/28/2024 01:50 PM   Modules accepted: Orders

## 2024-02-28 NOTE — Progress Notes (Signed)
 Subjective:    Patient ID: Jonathan Shelton, male    DOB: 07-01-1951, 73 y.o.   MRN: [id number]  Jonathan Shelton is a 73 y.o. male presenting on 02/28/2024 for Suture / Staple Removal   HPI   Discussed the use of AI scribe software for clinical note transcription with the patient, who gave verbal consent to proceed.  History of Present Illness   He is a 73 year old male who presents for staple removal following a head laceration due to a fall.      ED FOLLOW-UP VISIT  Hospital/Location: [facility] Date of ED Visit: 02/19/24  Reason for Presenting to ED: Fall, head injury, laceration  FOLLOW-UP  - ED provider note and record have been reviewed - Patient presents today about 9 days after recent ED visit. Brief summary of recent course    He experienced a fall on February 19, 2024, resulting in a head laceration. He was found lying in the driveway and was initially unaware of the injury. The fall caused a laceration on the back of his head, which required six staples for closure. A CT scan performed at the time of the injury showed no brain bleeds, concussion, or fracture. No unusual symptoms such as passing out or loss of consciousness followed the injury. The staples were placed nine days ago, and he is here for their removal.  The laceration site does not hurt, although there is some swelling. There has been no active bleeding from the site. The staples were placed in two locations on the back of his head, with five on the right and one on the left. He has not had staples before, although he had a similar fall a year ago that resulted in a goose egg but no laceration.  He is concerned about future falls and inquired about insurance coverage for a life alert system. He has spoken with [employer], who informed him that they do not accept insurance, and he is exploring other options.  He has history of ALS and is managed by Neurologist at [facility]  He is currently using Ozempic at a dose of 2  mg. His blood sugar levels have been stable, with some days not requiring insulin and other days requiring 5 to 10 units.   - Today reports overall has done well after discharge from ED. Symptoms have resolved  - New medications on discharge: none - Changes to current meds on discharge: none  I have reviewed the discharge medication list, and have reconciled the current and discharge medications today.      02/28/2024    1:54 PM 05/12/2023    2:08 PM 01/14/2023    2:30 PM  Depression screen PHQ 2/9  Decreased Interest 3 0 0  Down, Depressed, Hopeless 1 0 0  PHQ - 2 Score 4 0 0  Altered sleeping 2 0 1  Tired, decreased energy 3 0 1  Change in appetite 2 0 1  Feeling bad or failure about yourself  2 0 0  Trouble concentrating 1 0 0  Moving slowly or fidgety/restless 3 0 3  Suicidal thoughts 1 0 0  PHQ-9 Score 18 0 6  Difficult doing work/chores Very difficult Not difficult at all Not difficult at all       02/28/2024    1:54 PM 01/14/2023    2:30 PM 06/02/2021    1:48 PM  GAD 7 : Generalized Anxiety Score  Nervous, Anxious, on Edge 0 0 0  Control/stop worrying 0 0  0  Worry too much - different things 0 0 0  Trouble relaxing 0 0 0  Restless 0 0 0  Easily annoyed or irritable 0  0  Afraid - awful might happen 0 0 0  Total GAD 7 Score 0  0  Anxiety Difficulty Somewhat difficult Not difficult at all Not difficult at all    Social History   Tobacco Use   Smoking status: Former    Current packs/day: 0.00    Types: Cigarettes    Quit date: 1970    Years since quitting: 55.3   Smokeless tobacco: Former    Types: Snuff  Vaping Use   Vaping status: Never Used  Substance Use Topics   Alcohol use: Not Currently   Drug use: Not Currently    Comment: past    Review of Systems Per HPI unless specifically indicated above     Objective:    BP 138/68 (BP Location: Left Arm, Cuff Size: Normal)   Pulse 80   Ht 5\' 9"  (1.753 m)   SpO2 98%   BMI 33.08 kg/m   Wt Readings  from Last 3 Encounters:  02/19/24 223 lb 15.8 oz (101.6 kg)  12/14/23 224 lb (101.6 kg)  06/10/23 229 lb (103.9 kg)    Physical Exam Vitals and nursing note reviewed.  Constitutional:      General: He is not in acute distress.    Appearance: Normal appearance. He is well-developed. He is obese. He is not diaphoretic.     Comments: Well-appearing, comfortable, cooperative  HENT:     Head: Normocephalic.     Comments: Left posterior scalp with scabbed laceration x 2 healing, has 2 lacerations that are healing, left one smaller has 1 staple and right one has 5 staples. Uncomplicated, non tender, no oozing or bleeding. Eyes:     General:        Right eye: No discharge.        Left eye: No discharge.     Conjunctiva/sclera: Conjunctivae normal.  Cardiovascular:     Rate and Rhythm: Normal rate.  Pulmonary:     Effort: Pulmonary effort is normal.  Skin:    General: Skin is warm and dry.     Findings: No erythema or rash.  Neurological:     Mental Status: He is alert and oriented to person, place, and time.  Psychiatric:        Mood and Affect: Mood normal.        Behavior: Behavior normal.        Thought Content: Thought content normal.     Comments: Well groomed, good eye contact. Baseline speech    Pre-Staple removal    Post staple removal    Results for orders placed or performed in visit on 12/14/23  POCT HgB A1C   Collection Time: 12/14/23  3:50 PM  Result Value Ref Range   Hemoglobin A1C 5.7 (A) 4.0 - 5.6 %   HbA1c POC (<> result, manual entry)     HbA1c, POC (prediabetic range)     HbA1c, POC (controlled diabetic range)        Assessment & Plan:   Problem List Items Addressed This Visit   None Visit Diagnoses       Scalp pain    -  Primary     Laceration of scalp, initial encounter         Removal of staple            Head laceration /  Staple removal Head laceration sustained on February 19, 2024, treated with six staples. CT scan negative for  intracranial injury.  No complications from head injury fall Laceration healing well Scabs formed No bleeding or separation All 6 staples removed today without problem. Mild oozing but resolved after procedure completed  - Advise washing wound with soap and water. - Apply antibiotic ointment to the wound.  Fall risk Increased fall risk due to recent fall. Discussed life alert system options and insurance coverage. Advised consulting [facility] social worker for assistance. - Consult with [facility] social worker regarding life alert system. - Contact clinic if unable to arrange life alert system independently - if need, we can refer to [facility] team  No orders of the defined types were placed in this encounter.   No orders of the defined types were placed in this encounter.   Follow up plan: Return if symptoms worsen or fail to improve.   Domingo Friend, DO [facility] 02/28/2024, 1:29 PM

## 2024-02-28 NOTE — Patient Instructions (Addendum)
 Thank you for coming to the office today.  Consider a Life Alert product through insurance. Check with [facility] Social Worker next week. If cannot get it arranged, contact us  back and we can work with a Financial risk analyst or Social Work through [employer] that can help arrange this.  Removal of all 6 staples.  Please schedule a Follow-up Appointment to: Return if symptoms worsen or fail to improve.  If you have any other questions or concerns, please feel free to call the office or send a message through MyChart. You may also schedule an earlier appointment if necessary.  Additionally, you may be receiving a survey about your experience at our office within a few days to 1 week by e-mail or mail. We value your feedback.  Domingo Friend, DO [facility], [place]

## 2024-03-05 LAB — HM DIABETES EYE EXAM

## 2024-04-02 ENCOUNTER — Emergency Department

## 2024-04-02 ENCOUNTER — Emergency Department
Admission: EM | Admit: 2024-04-02 | Discharge: 2024-04-02 | Disposition: A | Discharge status: Home / Self Care | Attending: Emergency Medicine | Admitting: Emergency Medicine

## 2024-04-02 ENCOUNTER — Other Ambulatory Visit: Payer: Self-pay

## 2024-04-02 ENCOUNTER — Encounter: Payer: Self-pay | Admitting: Emergency Medicine

## 2024-04-02 DIAGNOSIS — Y92512 Supermarket, store or market as the place of occurrence of the external cause: Secondary | ICD-10-CM | POA: Diagnosis not present

## 2024-04-02 DIAGNOSIS — S43101A Unspecified dislocation of right acromioclavicular joint, initial encounter: Secondary | ICD-10-CM | POA: Diagnosis not present

## 2024-04-02 DIAGNOSIS — M542 Cervicalgia: Secondary | ICD-10-CM | POA: Insufficient documentation

## 2024-04-02 DIAGNOSIS — S4991XA Unspecified injury of right shoulder and upper arm, initial encounter: Secondary | ICD-10-CM | POA: Diagnosis present

## 2024-04-02 DIAGNOSIS — W010XXA Fall on same level from slipping, tripping and stumbling without subsequent striking against object, initial encounter: Secondary | ICD-10-CM | POA: Insufficient documentation

## 2024-04-02 MED ORDER — OXYCODONE-ACETAMINOPHEN 5-325 MG PO TABS: 1.0000 | ORAL_TABLET | ORAL | 0 refills | Status: DC | PRN

## 2024-04-02 MED ORDER — OXYCODONE HCL 5 MG PO TABS
5.0000 mg | ORAL_TABLET | Freq: Once | ORAL | Status: AC
  Administered 2024-04-02: 5 mg via ORAL
  Filled 2024-04-02: qty 1

## 2024-04-02 MED ORDER — OXYCODONE-ACETAMINOPHEN 5-325 MG PO TABS
1.0000 | ORAL_TABLET | Freq: Once | ORAL | Status: AC
  Administered 2024-04-02: 1 via ORAL
  Filled 2024-04-02: qty 1

## 2024-04-02 NOTE — ED Provider Notes (Signed)
 [facility] Provider Note    Event Date/Time   First MD Initiated Contact with Patient 04/02/24 2034     (approximate)   History   Fall and Shoulder Injury   HPI Jonathan Shelton is a 73 y.o. male presenting today for ground-level fall.  Patient was resting up against a car when his hand slipped and he fell into his right shoulder in the car.  Denies head or neck injury.  Reportedly had deformity to his right shoulder.  No numbness or tingling.     Physical Exam   Triage Vital Signs: ED Triage Vitals  Encounter Vitals Group     BP 04/02/24 1815 135/76     Systolic BP Percentile --      Diastolic BP Percentile --      Pulse Rate 04/02/24 1815 82     Resp 04/02/24 1815 16     Temp 04/02/24 1815 99.9 F (37.7 C)     Temp Source 04/02/24 1815 Oral     SpO2 04/02/24 1815 96 %     Weight 04/02/24 1816 210 lb (95.3 kg)     Height 04/02/24 1816 5\' 9"  (1.753 m)     Head Circumference --      Peak Flow --      Pain Score 04/02/24 1816 10     Pain Loc --      Pain Education --      Exclude from Growth Chart --     Most recent vital signs: Vitals:   04/02/24 1815 04/02/24 2126  BP: 135/76 133/79  Pulse: 82 80  Resp: 16 18  Temp: 99.9 F (37.7 C)   SpO2: 96% 95%   I have reviewed the vital signs. General:  Awake, alert, no acute distress. Head:  Normocephalic, Atraumatic. EENT:  PERRL, EOMI, Oral mucosa pink and moist, Neck is supple. Cardiovascular: Regular rate, 2+ distal pulses. Respiratory:  Normal respiratory effort, symmetrical expansion, no distress.   Extremities: Deformity with bony prominence seen in the right shoulder but no obvious evidence of dislocation with full range of motion but do hear clicking and popping when rotating the shoulder. Neuro:  Alert and oriented.  Interacting appropriately.   Skin:  Warm, dry, no rash.   Psych: Appropriate affect.    ED Results / Procedures / Treatments   Labs (all labs ordered are  listed, but only abnormal results are displayed) Labs Reviewed - No data to display   EKG    RADIOLOGY Independently interpreted x-ray and subsequent CT of right shoulder with severe A/C joint dislocation but no other fracture or shoulder dislocation   PROCEDURES:  Critical Care performed: No  Procedures   MEDICATIONS ORDERED IN ED: Medications  oxyCODONE  (Oxy IR/ROXICODONE ) immediate release tablet 5 mg (5 mg Oral Given 04/02/24 1821)  oxyCODONE -acetaminophen  (PERCOCET/ROXICET) 5-325 MG per tablet 1 tablet (1 tablet Oral Given 04/02/24 2124)     IMPRESSION / MDM / ASSESSMENT AND PLAN / ED COURSE  I reviewed the triage vital signs and the nursing notes.                              Differential diagnosis includes, but is not limited to, [facility] joint dislocation, shoulder dislocation, distal clavicle or scapula fracture  Patient's presentation is most consistent with acute complicated illness / injury requiring diagnostic workup.  Patient is a 73 year old male presenting today for right shoulder pain following fall  into it.  X-ray and further imaging with CT of right shoulder confirms severe right AC joint dislocation.  No other fractures are present.  Patient placed in sling and will follow-up with orthopedic outpatient.  Sent with as needed Percocet and instructed on use and not to drive or operate other machinery while on vehicle.     FINAL CLINICAL IMPRESSION(S) / ED DIAGNOSES   Final diagnoses:  Dislocation of right acromioclavicular joint, initial encounter     Rx / DC Orders   ED Discharge Orders          Ordered    AMB referral to orthopedics        04/02/24 2210    oxyCODONE -acetaminophen  (PERCOCET) 5-325 MG tablet  Every 4 hours PRN        04/02/24 2217             Note:  This document was prepared using Dragon voice recognition software and may include unintentional dictation errors.   Kandee Orion, MD 04/02/24 2218

## 2024-04-02 NOTE — Discharge Instructions (Addendum)
 You have a dislocation of the [facility] joint which is a ligament connection between your clavicle and the top of your scapula.  There is no broken bone.  Please wear the sling until you follow-up with orthopedics for reevaluation within the upcoming week.

## 2024-04-02 NOTE — ED Provider Triage Note (Signed)
 Emergency Medicine Provider Triage Evaluation Note  Jonathan Shelton , a 73 y.o. male  was evaluated in triage.  Pt complains of fall directly onto right arm while in the grocery store sustaining right clavicle and shoulder injury. Worse with movement. Denies head injury and LOC  Review of Systems  Positive:  Negative:   Physical Exam  There were no vitals taken for this visit. Gen:   Awake, no distress   Resp:  Normal effort  MSK:   Right clavicle deformity. Neurovascular status intact. Unable to move right shoulder.   Medical Decision Making  Medically screening exam initiated at 6:16 PM.  Appropriate orders placed.  Jonathan Shelton was informed that the remainder of the evaluation will be completed by another provider, this initial triage assessment does not replace that evaluation, and the importance of remaining in the ED until their evaluation is complete.    Phyllis Breeze, Shanita Kanan A, PA-C 04/02/24 1818

## 2024-04-02 NOTE — ED Triage Notes (Signed)
 Pt to ED via POV with wife. Pt has hx/o ALS. Pt was at the grocery store with his wife and fell. Pt has obvious deformity to his right shoulder. Pt denies hitting his head or having pain in his neck.

## 2024-04-04 ENCOUNTER — Telehealth: Payer: Self-pay

## 2024-04-04 NOTE — Telephone Encounter (Signed)
 Copied from CRM [id number]. Topic: Clinical - Medication Question >> Apr 04, 2024 11:35 AM Georgeann Kindred wrote: Reason for CRM: Patient requesting a prescription for a motorized recliner that will help him stand as he fell on Monday 05/20 and is not able to get out of his recliner. He has been seen in the hospital for the fall and has an appt with Ortho on Friday 05/23. In addition, he is requesting a prescription for a motorized scooter. Please contact patient back at [phone].   Patient is stating that he attempted to write a message to Dr. Jacklin Mascot through his MyChart acct but was not able to. Please assist

## 2024-04-04 NOTE — Telephone Encounter (Signed)
 Spoke with patient appt made

## 2024-04-04 NOTE — Telephone Encounter (Signed)
 Please schedule patient for "Mobility Assessment". Documented office visit for mobility is required in order to authorize motorized scooter or any motorized mobility equipment.   I am unsure about the motorized recliner. But we could use the same visit to document that as well.  It will need to be face to face office visit.  Also, please notify them that they will need to identify an in network "[employer]" they can find one through their insurance or we can try one of our local options. They will need to find a place that can fulfill these equipment orders.  I can provide the medical prescription but I cannot not handle ordering these devices.  There are other companies as well if these do not offer the equipment he is looking for or if they are not in [facility]  [facility] [address] [place], [place]  [address] Ph: [phone] Fax: [phone]   [employer] [address] [place], [place] [address] Ph: [phone] Fax: [phone]  Domingo Friend, DO [facility] 04/04/2024, 12:59 PM

## 2024-04-11 ENCOUNTER — Ambulatory Visit: Admitting: Family Medicine

## 2024-04-11 ENCOUNTER — Ambulatory Visit (INDEPENDENT_AMBULATORY_CARE_PROVIDER_SITE_OTHER): Admitting: Family Medicine

## 2024-04-11 ENCOUNTER — Encounter: Payer: Self-pay | Admitting: Family Medicine

## 2024-04-11 VITALS — BP 124/70 | HR 90 | Ht 69.0 in | Wt 210.0 lb

## 2024-04-11 DIAGNOSIS — M47816 Spondylosis without myelopathy or radiculopathy, lumbar region: Secondary | ICD-10-CM

## 2024-04-11 DIAGNOSIS — Z96651 Presence of right artificial knee joint: Secondary | ICD-10-CM

## 2024-04-11 DIAGNOSIS — M170 Bilateral primary osteoarthritis of knee: Secondary | ICD-10-CM | POA: Diagnosis not present

## 2024-04-11 DIAGNOSIS — G1221 Amyotrophic lateral sclerosis: Secondary | ICD-10-CM | POA: Diagnosis not present

## 2024-04-11 DIAGNOSIS — I6930 Unspecified sequelae of cerebral infarction: Secondary | ICD-10-CM | POA: Diagnosis not present

## 2024-04-11 DIAGNOSIS — S43101A Unspecified dislocation of right acromioclavicular joint, initial encounter: Secondary | ICD-10-CM

## 2024-04-11 NOTE — Progress Notes (Addendum)
 Subjective:    Patient ID: Jonathan Shelton, male    DOB: 01/22/1951, 73 y.o.   MRN: [id number]  Jonathan Shelton is a 73 y.o. male presenting on 04/11/2024 for ALS (Muscle weakness) and Shoulder Pain (R Shoulder AC separation injury)   HPI  Discussed the use of AI scribe software for clinical note transcription with the patient, who gave verbal consent to proceed.  History of Present Illness   Jonathan Shelton is a 73 year old male with ALS who presents for a mobility assessment following a right AC joint separation. He is accompanied by his spouse.  Chief Complaint: MOBILITY EXAMINATION   Change to now require power mobility device (PMD)   Recent fall injury 04/02/24. He sustained a right acromioclavicular [facility]) joint separation after ED eval with initial imaging in the emergency department suggested a dislocation, but an orthopedic specialist later confirmed a grade three AC joint separation. The injury has led to significant bruising and visible bone displacement, with the shoulder blade positioned abnormally under the collarbone. This has severely impacted his mobility, making it difficult to get out of his recliner and preventing him from using his right arm for weight-bearing activities.  He is currently under Orthopedic restrictions to avoid using the right arm for lifting or weight-bearing tasks. He has been using a walker but has difficulty walking long distances now. He is seeking a motorized mobility device to aid in his daily activities, both indoors and outdoors. His past medical history of ALS has contributed to muscle weakness and joint issues, particularly affecting his lower limbs and right upper extremity. He retains good strength in his left upper extremity.  Interested in Naval architect or device that is lightweight and effective for indoors and outdoors  He has ALS (Amyotrophic Lateral Sclerosis), progressive neurodegenerative disease and is followed and  managed by [facility] - With his ALS he is susceptible to recurrent falls and has generalized weakness, now with the above R shoulder injury he has more difficulty with his mobility.  - Pressure Sores = None actively - Ability to Shift Weight = Able to shift weight, but unable to transfer due to lack of strength           02/28/2024    1:54 PM 05/12/2023    2:08 PM 01/14/2023    2:30 PM  Depression screen PHQ 2/9  Decreased Interest 3 0 0  Down, Depressed, Hopeless 1 0 0  PHQ - 2 Score 4 0 0  Altered sleeping 2 0 1  Tired, decreased energy 3 0 1  Change in appetite 2 0 1  Feeling bad or failure about yourself  2 0 0  Trouble concentrating 1 0 0  Moving slowly or fidgety/restless 3 0 3  Suicidal thoughts 1 0 0  PHQ-9 Score 18 0 6  Difficult doing work/chores Very difficult Not difficult at all Not difficult at all       02/28/2024    1:54 PM 01/14/2023    2:30 PM 06/02/2021    1:48 PM  GAD 7 : Generalized Anxiety Score  Nervous, Anxious, on Edge 0 0 0  Control/stop worrying 0 0 0  Worry too much - different things 0 0 0  Trouble relaxing 0 0 0  Restless 0 0 0  Easily annoyed or irritable 0  0  Afraid - awful might happen 0 0 0  Total GAD 7 Score 0  0  Anxiety Difficulty Somewhat difficult Not difficult at  all Not difficult at all    Social History   Tobacco Use   Smoking status: Former    Current packs/day: 0.00    Types: Cigarettes    Quit date: 1970    Years since quitting: 55.4   Smokeless tobacco: Former    Types: Snuff  Vaping Use   Vaping status: Never Used  Substance Use Topics   Alcohol use: Not Currently   Drug use: Not Currently    Comment: past    Review of Systems Per HPI unless specifically indicated above     Objective:     BP 124/70 (BP Location: Right Arm, Patient Position: Sitting, Cuff Size: Normal)   Pulse 90   Ht 5\' 9"  (1.753 m)   Wt 210 lb (95.3 kg)   SpO2 95%   BMI 31.01 kg/m   Wt Readings from Last 3 Encounters:   04/11/24 210 lb (95.3 kg)  04/02/24 210 lb (95.3 kg)  02/19/24 223 lb 15.8 oz (101.6 kg)    Physical Exam Vitals and nursing note reviewed.  Constitutional:      General: He is not in acute distress.    Appearance: He is well-developed. He is not diaphoretic.     Comments: Well-appearing, comfortable, cooperative  HENT:     Head: Normocephalic and atraumatic.     Comments: Hoarse voice Eyes:     General:        Right eye: No discharge.        Left eye: No discharge.     Conjunctiva/sclera: Conjunctivae normal.  Neck:     Thyroid : No thyromegaly.  Cardiovascular:     Rate and Rhythm: Normal rate and regular rhythm.     Pulses: Normal pulses.     Heart sounds: Normal heart sounds. No murmur heard. Pulmonary:     Effort: Pulmonary effort is normal. No respiratory distress.     Breath sounds: Normal breath sounds. No wheezing or rales.  Musculoskeletal:     Cervical back: Normal range of motion and neck supple.     Right lower leg: No edema.     Left lower leg: No edema.     Comments: See below strength R Shoulder with AC deformity protruding Muscle stiffness and some reduced ROM  Lymphadenopathy:     Cervical: No cervical adenopathy.  Skin:    General: Skin is warm and dry.     Findings: Bruising (R shoulder) present. No erythema or rash.  Neurological:     Mental Status: He is alert and oriented to person, place, and time. Mental status is at baseline.  Psychiatric:        Behavior: Behavior normal.     Comments: Well groomed, good eye contact, stable now with slowed speech, occasional word finding issue.     Upper and Lower Extremity Assessment RUE - Strength - 2 out of 5 - Pain - 8 out of 10 - Range of Motion - Very limited   LUE - Strength - 5 out of 5 - Pain - 1 out of 10 - Range of Motion - mostly full range   RLE - Strength 3 out of 5 - Pain - 3 out of 10 - Range of Motion - able to flex and extend, reduced range of motion   LLE - Strength - 3 out  of 5 - Pain -3 out of 10 - Range of Motion - able to flex and extend, reduced range of motion    I have personally reviewed  the radiology report from 04/02/24 on CT Shoulder R side.  CLINICAL DATA:  Fall, right shoulder deformity   EXAM: CT OF THE UPPER RIGHT EXTREMITY WITHOUT CONTRAST   TECHNIQUE: Multidetector CT imaging of the upper right extremity was performed according to the standard protocol.   RADIATION DOSE REDUCTION: This exam was performed according to the departmental dose-optimization program which includes automated exposure control, adjustment of the mA and/or kV according to patient size and/or use of iterative reconstruction technique.   COMPARISON:  None Available.   FINDINGS: Bones/Joint/Cartilage   There is superior dislocation of the right acromioclavicular joint. No superimposed acute fracture. Remote healed fracture of the surgical neck of the right humerus without residual deformity. Otherwise normal alignment. Mild glenohumeral degenerative arthritis.   Ligaments   Suboptimally assessed by CT.   Muscles and Tendons   Unremarkable   Soft tissues   There is infiltration region of the acromioclavicular joint capsule and coracoclavicular ligament in keeping with hemorrhage or edema related to acute trauma. No mass formed hematoma identified.   IMPRESSION: 1. Superior dislocation of the right acromioclavicular joint without superimposed acute fracture. 2. Remote healed fracture of the surgical neck of the right humerus without residual deformity. 3. Mild glenohumeral degenerative arthritis. 4. Infiltration of the region of the acromioclavicular joint capsule and coracoclavicular ligament in keeping with hemorrhage or edema related to acute trauma. No formed hematoma identified.     Electronically Signed   By: Worthy Heads M.D.   On: 04/02/2024 22:06  Results for orders placed or performed in visit on 12/14/23  POCT HgB A1C    Collection Time: 12/14/23  3:50 PM  Result Value Ref Range   Hemoglobin A1C 5.7 (A) 4.0 - 5.6 %   HbA1c POC (<> result, manual entry)     HbA1c, POC (prediabetic range)     HbA1c, POC (controlled diabetic range)        Assessment & Plan:   Problem List Items Addressed This Visit     ALS (amyotrophic lateral sclerosis) (HCC) - Primary   Relevant Orders   DME Wheelchair electric   For home use only DME Other see comment   Bilateral primary osteoarthritis of knee   Relevant Orders   DME Wheelchair electric   For home use only DME Other see comment   History of cerebrovascular accident (CVA) with residual deficit   Relevant Orders   DME Wheelchair electric   For home use only DME Other see comment   History of total knee arthroplasty, right   Relevant Orders   DME Wheelchair electric   Spondylosis of lumbar region without myelopathy or radiculopathy   Relevant Orders   DME Wheelchair electric   Other Visit Diagnoses       Separation of right acromioclavicular joint, initial encounter       Relevant Orders   DME Wheelchair electric   For home use only DME Other see comment        Right AC joint separation, grade 3 Grade 3 separation confirmed by orthopedics. Nonsurgical management advised with activity restrictions. - Restrict weight-bearing and lifting on right shoulder. - Follow up with orthopedics in 4-6 weeks for x-rays and reevaluation.  ALS (Amyotrophic Lateral Sclerosis) Diagnosed 2022 and [facility]  ALS causing significant mobility impairment with bilateral lower extremity weakness requiring power wheelchair for ambulation. See medical necessity below  Additional factors involving his mobility loss are his Right Shoulder, see above.  - Order Animal nutritionist and  power wheelchair from [employer] - Document medical necessity for insurance approval. - Coordinate with [employer] for equipment procurement and insurance processing.       - The primary medical condition that impacts patient's mobility need is his status of ALS causing muscle weakness bilateral lower extremities and postural muscles. - Additional medical conditions contributing to his mobility, include Right Shoulder AC Joint separation Grade 3, recent injury that has limited his weight bearing ability on his R upper extremity, also osteoarthritis bilateral knees with history of knee replacement.   MRADLs impaired in the home include: - PMD is necessary for patient's mobility to get to the bathroom for routine toilet use. - PMD is necessary for patient's mobility to get to the kitchen to prepare meals - PMD is necessary for patient's mobility to get to the bedroom to dress and sleep   Cane or Walker Patient cannot use a cane / walker due to his status of ALS, weakness, and R shoulder separation now he cannot put weight on his Right Shoulder limiting his ability to properly and safely use these devices. His weakness as listed above with RUE 2/5 due to pain with shoulder. LUE 5/5 intact   Manual Wheelchair Patient cannot use MWC due to Right Shoulder Separation AC joint causing pain and weakness limiting his ability to properly and safely use this device to propel movement. His weakness as listed above with RUE 2/5 and LUE 5 / 5.   Scooter (POV) Patient cannot use a POV due to lack of postural stability, with generalized weakness in upper and lower extremities with ALS.   Patient can safely operate the power mobility device physically. He is mentally capable to operate the device.   Patient is willing and motivated to use the power mobility device in his home to improve his quality of life.    Additional DME request today is for Customer service manager. See above documentation. He has profound generalized weakness in bilateral lower extremities and now with R Shoulder Separation AC Joint injury he is not able to bear weight with his R shoulder,  it is difficult for him to get out of chair from seated position due to pain and weakness. He is high fall risk when transitioning positions. He will benefit from electric lift chair due to his ALS and Shoulder Injury and generalized weakness. He will benefit from recliner that can lay flat and allow him to rest and sleep due to limitation with positioning in and out of a normal chair and difficulty with transition.  Orders Placed This Encounter  Procedures   DME Wheelchair electric    ALS G12.12, History of CVA with residual deficit (I69.30), Bilateral Knee Arthritis (M17.0) [employer] features editor, need light weight product < 50 lbs if possible, patient height 5\' 9"  and weight 210 lbs.   For home use only DME Other see comment    ALS G12.12, History of CVA with residual deficit (I69.30), Bilateral Knee Arthritis (M17.0), Right Shoulder AC Separation (S43.101A) Power Electric Recliner, patient height 5\' 9"  and weight 210 lbs.    Length of Need:   12 Months     Orders Placed This Encounter  Procedures   DME Wheelchair electric    ALS G12.12, History of CVA with residual deficit (I69.30), Bilateral Knee Arthritis (M17.0) [employer] features editor, need light weight product < 50 lbs if possible, patient height 5\' 9"  and weight 210 lbs.   For home use only DME Other  see comment    ALS G12.12, History of CVA with residual deficit (I69.30), Bilateral Knee Arthritis (M17.0), Right Shoulder AC Separation (S43.101A) Power Electric Recliner, patient height 5\' 9"  and weight 210 lbs.    Length of Need:   12 Months    No orders of the defined types were placed in this encounter.   Follow up plan: Return if symptoms worsen or fail to improve.   Domingo Friend, DO [facility] 04/11/2024, 10:56 AM

## 2024-04-11 NOTE — Patient Instructions (Addendum)
 Thank you for coming to the office today.  We will order both equipment requests  Motorized Recliner and Power Mobility Wheelchair  [facility] [address] [place], [place]  [address] Ph: [phone] Fax: [phone]  Check with them next week if you have not heard back yet.  Please schedule a Follow-up Appointment to: Return if symptoms worsen or fail to improve.  If you have any other questions or concerns, please feel free to call the office or send a message through MyChart. You may also schedule an earlier appointment if necessary.  Additionally, you may be receiving a survey about your experience at our office within a few days to 1 week by e-mail or mail. We value your feedback.  Domingo Friend, DO [facility], [place]

## 2024-04-19 ENCOUNTER — Telehealth: Payer: Self-pay

## 2024-04-19 NOTE — Telephone Encounter (Signed)
 Copied from CRM [id number]. Topic: Clinical - Order For Equipment >> Apr 19, 2024 12:37 PM Ethelle Herb L wrote: Reason for CRM: Evalyn Hillier w/ [employer] member service requesting authorization for a powered wheelchair and seat life mechanism.   Fax number: [phone]

## 2024-05-17 ENCOUNTER — Other Ambulatory Visit: Payer: Self-pay | Admitting: Family Medicine

## 2024-05-17 ENCOUNTER — Other Ambulatory Visit: Payer: Self-pay

## 2024-05-17 ENCOUNTER — Ambulatory Visit: Payer: Medicare HMO

## 2024-05-17 ENCOUNTER — Encounter: Payer: Self-pay | Admitting: Family Medicine

## 2024-05-17 DIAGNOSIS — E1169 Type 2 diabetes mellitus with other specified complication: Secondary | ICD-10-CM

## 2024-05-17 MED ORDER — REPATHA 140 MG/ML ~~LOC~~ SOSY: 1.0000 | PREFILLED_SYRINGE | SUBCUTANEOUS | 3 refills | Status: DC

## 2024-05-20 ENCOUNTER — Other Ambulatory Visit: Payer: Self-pay | Admitting: Family Medicine

## 2024-05-20 DIAGNOSIS — Z794 Long term (current) use of insulin: Secondary | ICD-10-CM

## 2024-05-21 NOTE — Telephone Encounter (Signed)
 Refused Repatha  140 mg because this is a duplicate request.

## 2024-05-22 NOTE — Telephone Encounter (Signed)
 Requested Prescriptions  Pending Prescriptions Disp Refills   insulin  NPH Human (NOVOLIN N) 100 UNIT/ML injection [Pharmacy Med Name: NOVOLIN N 100 UNIT/ML VIAL] 40 mL 0    Sig: INJECT 40 UNITS (0.4 ML) INTO THE SKIN AT BEDTIME. MAY USE REDUCED DOSE AS ADVISED.(NOT COVERED)     Endocrinology:  Diabetes - Insulins Passed - 05/22/2024  2:52 PM      Passed - HBA1C is between 0 and 7.9 and within 180 days    Hemoglobin A1C  Date Value Ref Range Status  12/14/2023 5.7 (A) 4.0 - 5.6 % Final   Hgb A1c MFr Bld  Date Value Ref Range Status  06/10/2023 7.0 (H) <5.7 % of total Hgb Final    Comment:    For someone without known diabetes, a hemoglobin A1c value of 6.5% or greater indicates that they may have  diabetes and this should be confirmed with a follow-up  test. . For someone with known diabetes, a value <7% indicates  that their diabetes is well controlled and a value  greater than or equal to 7% indicates suboptimal  control. A1c targets should be individualized based on  duration of diabetes, age, comorbid conditions, and  other considerations. . Currently, no consensus exists regarding use of hemoglobin A1c for diagnosis of diabetes for children. SABRA Amy - Valid encounter within last 6 months    Recent Outpatient Visits           1 month ago ALS (amyotrophic lateral sclerosis) [facility])   [place] [facility] [place], Marsa PARAS, DO   2 months ago Scalp pain   [place] [facility] [place] Marsa PARAS, DO   4 months ago Bronchitis   [place] [facility] [place] Marsa PARAS, DO       Future Appointments             In 3 weeks [place], Marsa PARAS, DO [facility], [place]

## 2024-05-24 ENCOUNTER — Other Ambulatory Visit (HOSPITAL_COMMUNITY): Payer: Self-pay

## 2024-05-24 ENCOUNTER — Telehealth: Payer: Self-pay

## 2024-05-24 NOTE — Telephone Encounter (Signed)
 Pharmacy Patient Advocate Encounter   Received notification from CoverMyMeds that prior authorization for Repatha  140MG /ML syringes is required/requested.   Insurance verification completed.   The patient is insured through [employer] .   Per test claim: PA required; PA submitted to above mentioned insurance via CoverMyMeds Key/confirmation #/EOC [id number] Status is pending

## 2024-05-24 NOTE — Telephone Encounter (Signed)
 Pharmacy Patient Advocate Encounter  Received notification from [employer] that Prior Authorization for Repatha  140MG /ML syringes  has been APPROVED from 11/16/2023 to 05/24/2025. Ran test claim, Copay is $0.00. This test claim was processed through [facility]- copay amounts may vary at other pharmacies due to pharmacy/plan contracts, or as the patient moves through the different stages of their insurance plan.   PA #/Case ID/Reference #: [id number]

## 2024-06-05 ENCOUNTER — Other Ambulatory Visit: Payer: Self-pay | Admitting: Family Medicine

## 2024-06-05 DIAGNOSIS — Z794 Long term (current) use of insulin: Secondary | ICD-10-CM

## 2024-06-05 DIAGNOSIS — E1169 Type 2 diabetes mellitus with other specified complication: Secondary | ICD-10-CM

## 2024-06-06 ENCOUNTER — Other Ambulatory Visit: Payer: Self-pay

## 2024-06-06 DIAGNOSIS — E1169 Type 2 diabetes mellitus with other specified complication: Secondary | ICD-10-CM

## 2024-06-06 DIAGNOSIS — Z0000 Encounter for general adult medical examination without abnormal findings: Secondary | ICD-10-CM

## 2024-06-06 DIAGNOSIS — G1221 Amyotrophic lateral sclerosis: Secondary | ICD-10-CM

## 2024-06-06 DIAGNOSIS — N401 Enlarged prostate with lower urinary tract symptoms: Secondary | ICD-10-CM

## 2024-06-06 DIAGNOSIS — E538 Deficiency of other specified B group vitamins: Secondary | ICD-10-CM

## 2024-06-07 LAB — CBC WITH DIFFERENTIAL/PLATELET
Absolute Lymphocytes: 2211 {cells}/uL (ref 850–3900)
Absolute Monocytes: 737 {cells}/uL (ref 200–950)
Basophils Absolute: 67 {cells}/uL (ref 0–200)
Basophils Relative: 1 %
Eosinophils Absolute: 570 {cells}/uL — ABNORMAL HIGH (ref 15–500)
Eosinophils Relative: 8.5 %
HCT: 46.5 % (ref 38.5–50.0)
Hemoglobin: 15.3 g/dL (ref 13.2–17.1)
MCH: 31.1 pg (ref 27.0–33.0)
MCHC: 32.9 g/dL (ref 32.0–36.0)
MCV: 94.5 fL (ref 80.0–100.0)
MPV: 9.1 fL (ref 7.5–12.5)
Monocytes Relative: 11 %
Neutro Abs: 3116 {cells}/uL (ref 1500–7800)
Neutrophils Relative %: 46.5 %
Platelets: 221 Thousand/uL (ref 140–400)
RBC: 4.92 Million/uL (ref 4.20–5.80)
RDW: 13.1 % (ref 11.0–15.0)
Total Lymphocyte: 33 %
WBC: 6.7 Thousand/uL (ref 3.8–10.8)

## 2024-06-07 LAB — COMPLETE METABOLIC PANEL WITHOUT GFR
AG Ratio: 1.7 (calc) (ref 1.0–2.5)
ALT: 13 U/L (ref 9–46)
AST: 16 U/L (ref 10–35)
Albumin: 4.2 g/dL (ref 3.6–5.1)
Alkaline phosphatase (APISO): 78 U/L (ref 35–144)
BUN: 18 mg/dL (ref 7–25)
CO2: 26 mmol/L (ref 20–32)
Calcium: 9.3 mg/dL (ref 8.6–10.3)
Chloride: 104 mmol/L (ref 98–110)
Creat: 0.8 mg/dL (ref 0.70–1.28)
Globulin: 2.5 g/dL (ref 1.9–3.7)
Glucose, Bld: 92 mg/dL (ref 65–99)
Potassium: 4.4 mmol/L (ref 3.5–5.3)
Sodium: 139 mmol/L (ref 135–146)
Total Bilirubin: 0.7 mg/dL (ref 0.2–1.2)
Total Protein: 6.7 g/dL (ref 6.1–8.1)

## 2024-06-07 LAB — LIPID PANEL
Cholesterol: 95 mg/dL (ref <200)
HDL: 41 mg/dL (ref >=40)
LDL Cholesterol (Calc): 36 mg/dL
Non-HDL Cholesterol (Calc): 54 mg/dL (ref <130)
Total CHOL/HDL Ratio: 2.3 (calc) (ref <5.0)
Triglycerides: 96 mg/dL (ref <150)

## 2024-06-07 LAB — MICROALBUMIN / CREATININE URINE RATIO
Creatinine, Urine: 93 mg/dL (ref 20–320)
Microalb Creat Ratio: 100 mg/g{creat} — ABNORMAL HIGH (ref <30)
Microalb, Ur: 9.3 mg/dL

## 2024-06-07 LAB — PSA: PSA: 0.9 ng/mL (ref <=4.00)

## 2024-06-07 LAB — HEMOGLOBIN A1C
Hgb A1c MFr Bld: 6.4 % — ABNORMAL HIGH (ref <5.7)
Mean Plasma Glucose: 137 mg/dL
eAG (mmol/L): 7.6 mmol/L

## 2024-06-07 LAB — TSH: TSH: 3.87 m[IU]/L (ref 0.40–4.50)

## 2024-06-07 LAB — VITAMIN B12: Vitamin B-12: 688 pg/mL (ref 200–1100)

## 2024-06-07 NOTE — Telephone Encounter (Signed)
 Requested Prescriptions  Pending Prescriptions Disp Refills   empagliflozin  (JARDIANCE ) 25 MG TABS tablet [Pharmacy Med Name: JARDIANCE  25 MG TABLET] 90 tablet 0    Sig: TAKE 1 TABLET (25 MG TOTAL) BY MOUTH DAILY.     Endocrinology:  Diabetes - SGLT2 Inhibitors Failed - 06/07/2024  9:50 AM      Failed - eGFR in normal range and within 360 days    GFR, Est African American  Date Value Ref Range Status  08/12/2020 112 > OR = 60 mL/min/1.9m2 Final   GFR, Est Non African American  Date Value Ref Range Status  08/12/2020 96 > OR = 60 mL/min/1.74m2 Final   GFR, Estimated  Date Value Ref Range Status  09/01/2021 >60 >60 mL/min Final    Comment:    (NOTE) Calculated using the CKD-EPI Creatinine Equation (2021)    eGFR  Date Value Ref Range Status  06/10/2023 92 > OR = 60 mL/min/1.62m2 Final         Passed - Cr in normal range and within 360 days    Creat  Date Value Ref Range Status  06/06/2024 0.80 0.70 - 1.28 mg/dL Final   Creatinine, Urine  Date Value Ref Range Status  06/06/2024 93 20 - 320 mg/dL Final         Passed - HBA1C is between 0 and 7.9 and within 180 days    Hgb A1c MFr Bld  Date Value Ref Range Status  06/06/2024 6.4 (H) <5.7 % Final    Comment:    For someone without known diabetes, a hemoglobin  A1c value between 5.7% and 6.4% is consistent with prediabetes and should be confirmed with a  follow-up test. . For someone with known diabetes, a value <7% indicates that their diabetes is well controlled. A1c targets should be individualized based on duration of diabetes, age, comorbid conditions, and other considerations. . This assay result is consistent with an increased risk of diabetes. . Currently, no consensus exists regarding use of hemoglobin A1c for diagnosis of diabetes for children. SABRA Amy - Valid encounter within last 6 months    Recent Outpatient Visits           1 month ago ALS (amyotrophic lateral sclerosis) [facility])    [place] [facility] [place], Marsa PARAS, DO   3 months ago Scalp pain   [place] [facility] [place], Marsa PARAS, DO   4 months ago Bronchitis   [place] [facility] [place], Marsa PARAS, DO       Future Appointments             In 1 week Edman, Marsa PARAS, DO [place] [facility], [facility]             metFORMIN  (GLUCOPHAGE ) 1000 MG tablet [Pharmacy Med Name: METFORMIN  HCL 1,000 MG TABLET] 180 tablet 1    Sig: TAKE 1 TABLET (1,000 MG TOTAL) BY MOUTH TWICE A DAY WITH FOOD     Endocrinology:  Diabetes - Biguanides Failed - 06/07/2024  9:50 AM      Failed - eGFR in normal range and within 360 days    GFR, Est African American  Date Value Ref Range Status  08/12/2020 112 > OR = 60 mL/min/1.15m2 Final   GFR, Est Non African American  Date Value Ref Range Status  08/12/2020 96 > OR = 60 mL/min/1.37m2 Final   GFR, Estimated  Date Value Ref Range Status  09/01/2021 >60 >60 mL/min Final    Comment:    (NOTE) Calculated using the CKD-EPI Creatinine Equation (2021)    eGFR  Date Value Ref Range Status  06/10/2023 92 > OR = 60 mL/min/1.45m2 Final         Failed - CBC within normal limits and completed in the last 12 months    WBC  Date Value Ref Range Status  06/06/2024 6.7 3.8 - 10.8 Thousand/uL Final   RBC  Date Value Ref Range Status  06/06/2024 4.92 4.20 - 5.80 Million/uL Final   Hemoglobin  Date Value Ref Range Status  06/06/2024 15.3 13.2 - 17.1 g/dL Final   HCT  Date Value Ref Range Status  06/06/2024 46.5 38.5 - 50.0 % Final   MCHC  Date Value Ref Range Status  06/06/2024 32.9 32.0 - 36.0 g/dL Final    Comment:    For adults, a slight decrease in the calculated MCHC value (in the range of 30 to 32 g/dL) is most likely not clinically significant; however, it should be interpreted with caution in correlation with other red cell parameters and the patient's  clinical condition.    [facility]  Date Value Ref Range Status  06/06/2024 31.1 27.0 - 33.0 pg Final   MCV  Date Value Ref Range Status  06/06/2024 94.5 80.0 - 100.0 fL Final   No results found for: PLTCOUNTKUC, LABPLAT, POCPLA RDW  Date Value Ref Range Status  06/06/2024 13.1 11.0 - 15.0 % Final         Passed - Cr in normal range and within 360 days    Creat  Date Value Ref Range Status  06/06/2024 0.80 0.70 - 1.28 mg/dL Final   Creatinine, Urine  Date Value Ref Range Status  06/06/2024 93 20 - 320 mg/dL Final         Passed - HBA1C is between 0 and 7.9 and within 180 days    Hgb A1c MFr Bld  Date Value Ref Range Status  06/06/2024 6.4 (H) <5.7 % Final    Comment:    For someone without known diabetes, a hemoglobin  A1c value between 5.7% and 6.4% is consistent with prediabetes and should be confirmed with a  follow-up test. . For someone with known diabetes, a value <7% indicates that their diabetes is well controlled. A1c targets should be individualized based on duration of diabetes, age, comorbid conditions, and other considerations. . This assay result is consistent with an increased risk of diabetes. . Currently, no consensus exists regarding use of hemoglobin A1c for diagnosis of diabetes for children. .          Passed - B12 Level in normal range and within 720 days    Vitamin B-12  Date Value Ref Range Status  06/06/2024 688 200 - 1,100 pg/mL Final         Passed - Valid encounter within last 6 months    Recent Outpatient Visits           1 month ago ALS (amyotrophic lateral sclerosis) [facility])   [facility] [facility] [place], Marsa PARAS, DO   3 months ago Scalp pain   [place] [facility] [place], Marsa PARAS, DO   4 months ago Bronchitis   [facility] [place] Marsa PARAS, DO       Future Appointments             In  1 week [place] Marsa PARAS, DO [facility], [facility]

## 2024-06-08 ENCOUNTER — Encounter: Payer: Self-pay | Admitting: Family Medicine

## 2024-06-18 ENCOUNTER — Ambulatory Visit: Payer: Self-pay | Admitting: Family Medicine

## 2024-06-18 ENCOUNTER — Encounter: Payer: Self-pay | Admitting: Family Medicine

## 2024-06-18 ENCOUNTER — Other Ambulatory Visit: Payer: Self-pay | Admitting: Family Medicine

## 2024-06-18 VITALS — BP 124/68 | HR 87 | Ht 69.0 in | Wt 207.1 lb

## 2024-06-18 DIAGNOSIS — I6930 Unspecified sequelae of cerebral infarction: Secondary | ICD-10-CM

## 2024-06-18 DIAGNOSIS — Z794 Long term (current) use of insulin: Secondary | ICD-10-CM

## 2024-06-18 DIAGNOSIS — G720 Drug-induced myopathy: Secondary | ICD-10-CM

## 2024-06-18 DIAGNOSIS — Z0000 Encounter for general adult medical examination without abnormal findings: Secondary | ICD-10-CM

## 2024-06-18 DIAGNOSIS — N401 Enlarged prostate with lower urinary tract symptoms: Secondary | ICD-10-CM

## 2024-06-18 DIAGNOSIS — Z7985 Long-term (current) use of injectable non-insulin antidiabetic drugs: Secondary | ICD-10-CM

## 2024-06-18 DIAGNOSIS — Z1211 Encounter for screening for malignant neoplasm of colon: Secondary | ICD-10-CM

## 2024-06-18 DIAGNOSIS — E11649 Type 2 diabetes mellitus with hypoglycemia without coma: Secondary | ICD-10-CM

## 2024-06-18 DIAGNOSIS — E785 Hyperlipidemia, unspecified: Secondary | ICD-10-CM

## 2024-06-18 DIAGNOSIS — E1169 Type 2 diabetes mellitus with other specified complication: Secondary | ICD-10-CM | POA: Diagnosis not present

## 2024-06-18 DIAGNOSIS — M170 Bilateral primary osteoarthritis of knee: Secondary | ICD-10-CM

## 2024-06-18 DIAGNOSIS — G1221 Amyotrophic lateral sclerosis: Secondary | ICD-10-CM | POA: Diagnosis not present

## 2024-06-18 DIAGNOSIS — M47816 Spondylosis without myelopathy or radiculopathy, lumbar region: Secondary | ICD-10-CM

## 2024-06-18 MED ORDER — GVOKE HYPOPEN 2-PACK 1 MG/0.2ML ~~LOC~~ SOAJ: 1.0000 mg | SUBCUTANEOUS | 0 refills | Status: DC | PRN

## 2024-06-18 NOTE — Progress Notes (Signed)
 Subjective:    Patient ID: Jonathan Shelton, male    DOB: Nov 26, 1950, 73 y.o.   MRN: [id number]  Jonathan Shelton is a 73 y.o. male presenting on 06/18/2024 for Annual Exam   HPI  Discussed the use of AI scribe software for clinical note transcription with the patient, who gave verbal consent to proceed.  History of Present Illness   Jonathan Shelton is a 73 year old male with diabetes who presents for an annual physical exam. He is accompanied by his wife.  Glycemic control and hypoglycemia - Diabetes managed with insulin  and oral medications - A1c increased slightly to 6.4 - Insulin  doses range from 15 to 20 units on some nights; some nights insulin  is skipped - Nocturnal hypoglycemia present, with blood glucose as low as 68 mg/dL - Manages hypoglycemia by eating as needed  Mobility impairment - Approved for a lift chair through [employer] - Approved for a hover round type electric wheelchair, not the lighter version preferred - Continues to use rollator for mobility assistance  Immunization status - Has not received the updated pneumonia vaccine - Last pneumonia vaccine administered in 2021 after an episode of double pneumonia following COVID-19  BPH LUTS - Difficulty with urination - Currently using Flomax , which is ineffective up to 0.8mg  dose - Previously evaluated by urologist in 2021 without resolution of symptoms Interested in further evaluation   Type 2 Diabetes He is currently managing his diabetes with Ozempic  at a dose of 2 mg and metformin ,  A1c up to 6.4, still in range but higher than 5.7 On Ozempic  2mg  weekly, Jardiance  25mg  daily, Metformin  1000mg  On NPH Insulin  15-20, half nights skips some nights. Previously 20-40 Often night-alarm can wake him up due to hypoglycemia He experiences nocturnal hypoglycemia, with his sensor alerting him when glucose levels drop below 69 mg/dL.   On Ozempic  2mg  weekly, Jardiance  25mg  daily, Metformin  1000mg  TWICE A DAY  Insulin  NPH 15-20 u as above.    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 It was attempted to be scheduled Feb 2025 but it was too early, needs 01/28/24 or later. He will       02/28/2024    1:54 PM 05/12/2023    2:08 PM 01/14/2023    2:30 PM  Depression screen PHQ 2/9  Decreased Interest 3 0 0  Down, Depressed, Hopeless 1 0 0  PHQ - 2 Score 4 0 0  Altered sleeping 2 0 1  Tired, decreased energy 3 0 1  Change in appetite 2 0 1  Feeling bad or failure about yourself  2 0 0  Trouble concentrating 1 0 0  Moving slowly or fidgety/restless 3 0 3  Suicidal thoughts 1 0 0  PHQ-9 Score 18 0 6  Difficult doing work/chores Very difficult Not difficult at all Not difficult at all       02/28/2024    1:54 PM 01/14/2023    2:30 PM 06/02/2021    1:48 PM  GAD 7 : Generalized Anxiety Score  Nervous, Anxious, on Edge 0 0 0  Control/stop worrying 0 0 0  Worry too much - different things  0 0 0  Trouble relaxing 0 0 0  Restless 0 0 0  Easily annoyed or irritable 0  0  Afraid - awful might happen 0 0 0  Total GAD 7 Score 0  0  Anxiety Difficulty Somewhat difficult Not difficult at all Not difficult at all     Past Medical History:  Diagnosis Date   Allergy    ALS (amyotrophic lateral sclerosis) (HCC)    Arthritis    Basal cell carcinoma    Face. x2. Mohs surgery in [place].   BPH (benign prostatic hyperplasia)    Colon polyps    Diabetes (HCC)    Diverticulosis    Gallstones    GERD (gastroesophageal reflux disease)    HOH (hard of hearing)    Kidney stones    Renal disorder    Wears glasses    Past Surgical History:  Procedure Laterality Date   BIOPSY  01/22/2021    Procedure: BIOPSY;  Surgeon: Wilhelmenia, Aloha Raddle., MD;  Location: [facility] ENDOSCOPY;  Service: Gastroenterology;;   CARPAL TUNNEL RELEASE     CHOLECYSTECTOMY     COLONOSCOPY WITH PROPOFOL  N/A 02/06/2020   Procedure: COLONOSCOPY WITH PROPOFOL ;  Surgeon: Unk Corinn Skiff, MD;  Location: [facility] ENDOSCOPY;  Service: Gastroenterology;  Laterality: N/A;   COLONOSCOPY WITH PROPOFOL  N/A 03/31/2020   Procedure: COLONOSCOPY WITH PROPOFOL ;  Surgeon: Mansouraty, Aloha Raddle., MD;  Location: [facility] ENDOSCOPY;  Service: Gastroenterology;  Laterality: N/A;   COLONOSCOPY WITH PROPOFOL  N/A 01/22/2021   Procedure: COLONOSCOPY WITH PROPOFOL ;  Surgeon: Wilhelmenia Aloha Raddle., MD;  Location: [facility] ENDOSCOPY;  Service: Gastroenterology;  Laterality: N/A;   ENDOSCOPIC MUCOSAL RESECTION N/A 03/31/2020   Procedure: ENDOSCOPIC MUCOSAL RESECTION;  Surgeon: Wilhelmenia Aloha Raddle., MD;  Location: [facility] ENDOSCOPY;  Service: Gastroenterology;  Laterality: N/A;   HEMOSTASIS CLIP PLACEMENT  03/31/2020   Procedure: HEMOSTASIS CLIP PLACEMENT;  Surgeon: Wilhelmenia Aloha Raddle., MD;  Location: [facility] ENDOSCOPY;  Service: Gastroenterology;;   HERNIA REPAIR     IR KYPHO LUMBAR INC FX REDUCE BONE BX UNI/BIL CANNULATION INC/IMAGING  02/05/2021   IR RADIOLOGIST EVAL & MGMT  11/28/2020   IR RADIOLOGIST EVAL & MGMT  02/26/2021   POLYPECTOMY  03/31/2020   Procedure: POLYPECTOMY;  Surgeon: Wilhelmenia Aloha Raddle., MD;  Location[facility] ENDOSCOPY;  Service: Gastroenterology;;   POLYPECTOMY  01/22/2021   Procedure: POLYPECTOMY;  Surgeon: Wilhelmenia Aloha Raddle., MD;  Location: [facility] ENDOSCOPY;  Service: Gastroenterology;;   SMALL INTESTINE SURGERY     SUBMUCOSAL LIFTING INJECTION  03/31/2020   Procedure: SUBMUCOSAL LIFTING INJECTION;  Surgeon: Wilhelmenia Aloha Raddle., MD;  Location: [facility] ENDOSCOPY;  Service: Gastroenterology;;   TOTAL KNEE ARTHROPLASTY Right 08/31/2021   Procedure: TOTAL KNEE ARTHROPLASTY;  Surgeon: Leora Lynwood SAUNDERS, MD;  Location: [facility] ORS;  Service:  Orthopedics;  Laterality: Right;   Social History   Socioeconomic History   Marital status: Married    Spouse name: Not on file   Number of children: Not on file   Years of education: Graduate Degree   Highest education level: Master's degree (e.g., MA, MS, MEng, MEd, MSW, MBA)  Occupational History   Not on file  Tobacco Use   Smoking status: Former    Current packs/day: 0.00    Types: Cigarettes    Quit date: 1970    Years since quitting: 55.6   Smokeless tobacco: Former    Types: Snuff  Vaping Use   Vaping status: Never Used  Substance and Sexual Activity   Alcohol use: Not Currently  Drug use: Not Currently    Comment: past   Sexual activity: Not on file  Other Topics Concern   Not on file  Social History Narrative   Not on file   Social Drivers of Health   Financial Resource Strain: Medium Risk (04/06/2024)   Received from [facility]   Overall Financial Resource Strain (CARDIA)    Difficulty of Paying Living Expenses: Somewhat hard  Food Insecurity: No Food Insecurity (04/06/2024)   [facility] from [facility]   Hunger Vital Sign    Within the past 12 months, you worried that your food would run out before you got the money to buy more.: Never true    Within the past 12 months, the food you bought just didn't last and you didn't have money to get more.: Never true  Transportation Needs: No Transportation Needs (04/06/2024)   Received from [facility] - Transportation    In the past 12 months, has lack of transportation kept you from medical appointments or from getting medications?: No    Lack of Transportation (Non-Medical): No  Physical Activity: Inactive (05/12/2023)   Exercise Vital Sign    Days of Exercise per Week: 0 days    Minutes of Exercise per Session: 0 min  Stress: No Stress Concern Present (05/12/2023)   [employer] of Occupational Health - Occupational Stress Questionnaire     Feeling of Stress : Only a little  Social Connections: Moderately Integrated (05/12/2023)   Social Connection and Isolation Panel    Frequency of Communication with Friends and Family: More than three times a week    Frequency of Social Gatherings with Friends and Family: More than three times a week    Attends Religious Services: More than 4 times per year    Active Member of [employer] or Organizations: No    Attends Banker Meetings: Never    Marital Status: Married  Catering manager Violence: Not At Risk (05/12/2023)   Humiliation, Afraid, Rape, and Kick questionnaire    Fear of Current or Ex-Partner: No    Emotionally Abused: No    Physically Abused: No    Sexually Abused: No   Family History  Problem Relation Age of Onset   Heart disease Mother 77   Heart disease Father    Stroke Father 40   Diabetes Father    Heart attack Father    Liver disease Neg Hx    Colon cancer Neg Hx    Esophageal cancer Neg Hx    Pancreatic cancer Neg Hx    Stomach cancer Neg Hx    Inflammatory bowel disease Neg Hx    Rectal cancer Neg Hx    Current Outpatient Medications on File Prior to Visit  Medication Sig   albuterol  (VENTOLIN  HFA) 108 (90 Base) MCG/ACT inhaler INHALE 1-2 PUFFS INTO THE LUNGS EVERY 4 HOURS AS NEEDED FOR WHEEZING OR SHORTNESS OF BREATH.   Ascorbic Acid  (VITAMIN C PO) Take 1 tablet by mouth 3 (three) times a week.   azelastine  (ASTELIN ) 0.1 % nasal spray Place 2 sprays into both nostrils 2 (two) times daily. Use in each nostril as directed   baclofen (LIORESAL) 10 MG tablet Take by mouth.   cholecalciferol (VITAMIN D3) 25 MCG (1000 UNIT) tablet Take 1,000 Units by mouth 3 (three) times a week.   Cholecalciferol 10 MCG (400 UNIT) CHEW Chew by mouth.   Cinnamon 500 MG TABS Take by mouth.  cyanocobalamin  (VITAMIN B12) 1000 MCG/ML injection INJECT 1 ML (1,000 MCG TOTAL) INTO THE MUSCLE EVERY 30 DAYS.   Dextromethorphan -quiNIDine (NUEDEXTA) 20-10 MG capsule Take by mouth.    empagliflozin  (JARDIANCE ) 25 MG TABS tablet TAKE 1 TABLET (25 MG TOTAL) BY MOUTH DAILY.   finasteride  (PROSCAR ) 5 MG tablet TAKE 1 TABLET (5 MG TOTAL) BY MOUTH DAILY.   glucose blood test strip Check blood sugar up to 4 times daily   insulin  NPH Human (NOVOLIN N) 100 UNIT/ML injection INJECT 40 UNITS (0.4 ML) INTO THE SKIN AT BEDTIME. MAY USE REDUCED DOSE AS ADVISED.(NOT COVERED)   Insulin  Syringe-Needle U-100 31G X 1/4 0.5 ML MISC Use syringe to inject insulin  into skin daily.   magnesium gluconate (MAGONATE) 500 MG tablet Take 500 mg by mouth 2 (two) times daily.   meloxicam  (MOBIC ) 7.5 MG tablet Take 1 tablet (7.5 mg total) by mouth daily.   metFORMIN  (GLUCOPHAGE ) 1000 MG tablet TAKE 1 TABLET (1,000 MG TOTAL) BY MOUTH TWICE A DAY WITH FOOD   naproxen  sodium (ALEVE ) 220 MG tablet Take 220 mg by mouth. 3x times per day   Omega-3 Fatty Acids (FISH OIL) 1000 MG CAPS Take by mouth.   OZEMPIC , 2 MG/DOSE, 8 MG/3ML SOPN Inject 2 mg into the skin once a week.   REPATHA  140 MG/ML SOSY Inject 140 mg into the skin every 14 (fourteen) days.   Taurine 500 MG CAPS Take by mouth.   TURMERIC CURCUMIN PO Take by mouth.   Vitamin E  (VITAMIN E /D-ALPHA NATURAL) 268 MG (400 UNIT) CAPS Take by mouth.   zinc  gluconate 50 MG tablet Take 50 mg by mouth 3 (three) times a week.   aspirin  81 MG chewable tablet Chew 1 tablet (81 mg total) by mouth 2 (two) times daily. (Patient not taking: Reported on 04/11/2024)   hydrocortisone  (ANUSOL -HC) 25 MG suppository Place 1 suppository (25 mg total) rectally 2 (two) times daily. (Patient not taking: Reported on 06/18/2024)   oxyCODONE -acetaminophen  (PERCOCET) 5-325 MG tablet Take 1 tablet by mouth every 4 (four) hours as needed for severe pain (pain score 7-10). (Patient not taking: Reported on 06/18/2024)   pimecrolimus  (ELIDEL ) 1 % cream Apply twice daily to affected groin areas as needed for itching. (Patient not taking: Reported on 06/18/2024)   No current facility-administered  medications on file prior to visit.    Review of Systems  Constitutional:  Negative for activity change, appetite change, chills, diaphoresis, fatigue and fever.  HENT:  Positive for hearing loss and voice change. Negative for congestion.   Eyes:  Negative for visual disturbance.  Respiratory:  Negative for cough, chest tightness, shortness of breath and wheezing.   Cardiovascular:  Negative for chest pain, palpitations and leg swelling.  Gastrointestinal:  Negative for abdominal pain, constipation, diarrhea, nausea and vomiting.  Genitourinary:  Negative for dysuria, frequency and hematuria.  Musculoskeletal:  Negative for arthralgias and neck pain.  Skin:  Negative for rash.  Neurological:  Positive for weakness. Negative for dizziness, light-headedness, numbness and headaches.  Hematological:  Negative for adenopathy.  Psychiatric/Behavioral:  Negative for behavioral problems, dysphoric mood and sleep disturbance.    Per HPI unless specifically indicated above     Objective:    BP 124/68 (BP Location: Left Arm, Patient Position: Sitting, Cuff Size: Normal)   Pulse 87   Ht 5' 9 (1.753 m)   Wt 207 lb 2 oz (94 kg)   SpO2 95%   BMI 30.59 kg/m   Wt Readings from Last 3  Encounters:  06/18/24 207 lb 2 oz (94 kg)  04/11/24 210 lb (95.3 kg)  04/02/24 210 lb (95.3 kg)    Physical Exam Vitals and nursing note reviewed.  Constitutional:      General: He is not in acute distress.    Appearance: He is well-developed. He is not diaphoretic.     Comments: Well-appearing, comfortable, cooperative  HENT:     Head: Normocephalic and atraumatic.     Comments: Hoarse voice Eyes:     General:        Right eye: No discharge.        Left eye: No discharge.     Conjunctiva/sclera: Conjunctivae normal.  Neck:     Thyroid : No thyromegaly.  Cardiovascular:     Rate and Rhythm: Normal rate and regular rhythm.     Pulses: Normal pulses.     Heart sounds: Normal heart sounds. No murmur  heard. Pulmonary:     Effort: Pulmonary effort is normal. No respiratory distress.     Breath sounds: Normal breath sounds. No wheezing or rales.  Musculoskeletal:     Cervical back: Normal range of motion and neck supple.     Right lower leg: No edema.     Left lower leg: No edema.  Lymphadenopathy:     Cervical: No cervical adenopathy.  Skin:    General: Skin is warm and dry.     Findings: No bruising, erythema or rash.  Neurological:     Mental Status: He is alert and oriented to person, place, and time. Mental status is at baseline.  Psychiatric:        Behavior: Behavior normal.     Comments: Well groomed, good eye contact, stable now with slowed speech, occasional word finding issue.     Results for orders placed or performed in visit on 06/06/24  Vitamin B12   Collection Time: 06/06/24  8:59 AM  Result Value Ref Range   Vitamin B-12 688 200 - 1,100 pg/mL  TSH   Collection Time: 06/06/24  8:59 AM  Result Value Ref Range   TSH 3.87 0.40 - 4.50 mIU/L  PSA   Collection Time: 06/06/24  8:59 AM  Result Value Ref Range   PSA 0.90 < OR = 4.00 ng/mL  CBC with Differential/Platelet   Collection Time: 06/06/24  8:59 AM  Result Value Ref Range   WBC 6.7 3.8 - 10.8 Thousand/uL   RBC 4.92 4.20 - 5.80 Million/uL   Hemoglobin 15.3 13.2 - 17.1 g/dL   HCT 53.4 61.4 - 49.9 %   MCV 94.5 80.0 - 100.0 fL   MCH 31.1 27.0 - 33.0 pg   MCHC 32.9 32.0 - 36.0 g/dL   RDW 86.8 88.9 - 84.9 %   Platelets 221 140 - 400 Thousand/uL   MPV 9.1 7.5 - 12.5 fL   Neutro Abs 3,116 1,500 - 7,800 cells/uL   Absolute Lymphocytes 2,211 850 - 3,900 cells/uL   Absolute Monocytes 737 200 - 950 cells/uL   Eosinophils Absolute 570 (H) 15 - 500 cells/uL   Basophils Absolute 67 0 - 200 cells/uL   Neutrophils Relative % 46.5 %   Total Lymphocyte 33.0 %   Monocytes Relative 11.0 %   Eosinophils Relative 8.5 %   Basophils Relative 1.0 %  COMPLETE METABOLIC PANEL WITH GFR   Collection Time: 06/06/24  8:59  AM  Result Value Ref Range   Glucose, Bld 92 65 - 99 mg/dL   BUN 18 7 - 25 mg/dL  Creat 0.80 0.70 - 1.28 mg/dL   BUN/Creatinine Ratio SEE NOTE: 6 - 22 (calc)   Sodium 139 135 - 146 mmol/L   Potassium 4.4 3.5 - 5.3 mmol/L   Chloride 104 98 - 110 mmol/L   CO2 26 20 - 32 mmol/L   Calcium  9.3 8.6 - 10.3 mg/dL   Total Protein 6.7 6.1 - 8.1 g/dL   Albumin 4.2 3.6 - 5.1 g/dL   Globulin 2.5 1.9 - 3.7 g/dL (calc)   AG Ratio 1.7 1.0 - 2.5 (calc)   Total Bilirubin 0.7 0.2 - 1.2 mg/dL   Alkaline phosphatase (APISO) 78 35 - 144 U/L   AST 16 10 - 35 U/L   ALT 13 9 - 46 U/L  Hemoglobin A1c   Collection Time: 06/06/24  8:59 AM  Result Value Ref Range   Hgb A1c MFr Bld 6.4 (H) <5.7 %   Mean Plasma Glucose 137 mg/dL   eAG (mmol/L) 7.6 mmol/L  Lipid panel   Collection Time: 06/06/24  8:59 AM  Result Value Ref Range   Cholesterol 95 <200 mg/dL   HDL 41 > OR = 40 mg/dL   Triglycerides 96 <849 mg/dL   LDL Cholesterol (Calc) 36 mg/dL (calc)   Total CHOL/HDL Ratio 2.3 <5.0 (calc)   Non-HDL Cholesterol (Calc) 54 <869 mg/dL (calc)  Microalbumin / creatinine urine ratio   Collection Time: 06/06/24 11:44 AM  Result Value Ref Range   Creatinine, Urine 93 20 - 320 mg/dL   Microalb, Ur 9.3 mg/dL   Microalb Creat Ratio 100 (H) <30 mg/g creat      Assessment & Plan:   Problem List Items Addressed This Visit     ALS (amyotrophic lateral sclerosis) (HCC)   Relevant Orders   AMB Referral [facility] Care Management   Benign prostatic hyperplasia with incomplete bladder emptying   Relevant Orders   Ambulatory referral to Urology   Bilateral primary osteoarthritis of knee   Drug-induced myopathy   History of cerebrovascular accident (CVA) with residual deficit   Relevant Orders   AMB Referral [facility] Care Management   Hyperlipidemia associated with type 2 diabetes mellitus (HCC)   Relevant Medications   GVOKE HYPOPEN  2-PACK 1 MG/0.2ML SOAJ   Spondylosis of lumbar region without myelopathy or  radiculopathy   Type 2 diabetes mellitus with other specified complication (HCC)   Relevant Medications   GVOKE HYPOPEN  2-PACK 1 MG/0.2ML SOAJ   Other Relevant Orders   AMB Referral [facility] Care Management   Other Visit Diagnoses       Annual physical exam    -  Primary     Colon cancer screening       Relevant Orders   Ambulatory referral to Gastroenterology     Hypoglycemia associated with type 2 diabetes mellitus (HCC)       Relevant Medications   GVOKE HYPOPEN  2-PACK 1 MG/0.2ML SOAJ   Other Relevant Orders   AMB Referral [facility] Care Management     Long-term current use of injectable noninsulin antidiabetic medication            Updated Health Maintenance information Reviewed recent lab results with patient Encouraged improvement to lifestyle with diet and exercise Goal of weight loss  ALS (Amyotrophic Lateral Sclerosis) Diagnosed 2022 and followed by [facility]  ALS causing significant mobility impairment with bilateral lower extremity weakness requiring power wheelchair for ambulation. - Note unable to obtain electric power wheelchair due to unable to manage it in and out of house and  vehicle, this is not a viable option. They were not approved for smaller lightweight device. They use rollator walker  Type 2 Diabetes Mellitus with hypoglycemia due to insulin  therapy Type 2 diabetes with A1c of 6.4. Experiencing nocturnal hypoglycemia with current NPH insulin  regimen. 15-20u - Order Gvoke pen for emergency hypoglycemia. - Refer to [facility] pharmacist Sharyle for diabetes management. With hypoglycemia events - Adjust insulin  dosage to prevent hypoglycemia.   Request copy of last DM Eye Exam [facility] 02/2024  Benign prostatic hyperplasia with lower urinary tract symptoms Previous Urology 2021 BPH with persistent lower urinary tract symptoms. Previous tamsulosin  treatment ineffective, even up at 0.8mg  daily dosing. - Refer to urology for further evaluation and  management. Return to [facility], to discuss other treatment options  Adult Wellness Visit Annual wellness visit conducted. Discussed health maintenance including vaccinations and screenings. Pneumonia vaccination and colonoscopy screening addressed. Eye exam completed in April, documentation needed. - Discuss pneumonia vaccination and its benefits. - Send referral for colonoscopy screening again. Encouraged coordinating schedule w/ [facility]   Referral to [facility] clinical pharmacy, RN Case management   Orders Placed This Encounter  Procedures   Ambulatory referral to Gastroenterology    Referral Priority:   Routine    Referral Type:   Consultation    Referral Reason:   Specialty Services Required    Number of Visits Requested:   1   Ambulatory referral to Urology    Referral Priority:   Routine    Referral Type:   Consultation    Referral Reason:   Specialty Services Required    Requested Specialty:   Urology    Number of Visits Requested:   1   AMB Referral [facility] Care Management    Referral Priority:   Routine    Referral Type:   Consultation    Referral Reason:   Care Coordination    Number of Visits Requested:   1    Meds ordered this encounter  Medications   GVOKE HYPOPEN  2-PACK 1 MG/0.2ML SOAJ    Sig: Inject 1 mg into the skin as needed (hypoglycemia).    Dispense:  1 mL    Refill:  0     Follow up plan: Return in about 6 months (around 12/19/2024) for 6 month DM A1c.  Marsa Officer, DO [facility] 06/18/2024, 1:37 PM

## 2024-06-18 NOTE — Patient Instructions (Addendum)
 Thank you for coming to the office today.  For the Lift Chair  [facility] Address: [address], [place], [place] [address] Phone: [phone] Fax: [phone]  Future Prevnar-20 vaccine for pneumonia.  Referral back to [facility]  New referral back to Urology  [facility] -1st floor [address] [place],  [place]  [address] Phone: [phone]  Please schedule a Follow-up Appointment to: Return in about 6 months (around 12/19/2024) for 6 month DM A1c.  If you have any other questions or concerns, please feel free to call the office or send a message through MyChart. You may also schedule an earlier appointment if necessary.  Additionally, you may be receiving a survey about your experience at our office within a few days to 1 week by e-mail or mail. We value your feedback.  Marsa Officer, DO [facility], [place]

## 2024-06-20 NOTE — Telephone Encounter (Signed)
 Requested medication (s) are due for refill today: na   Requested medication (s) are on the active medication list: yes  Last refill:  06/18/24 #1 ml 0 refills  Future visit scheduled: yes 12/24/24  Notes to clinic:  medication not assigned to a protocol. Pharmacy comment: Alternative Requested:NOT ON FORMULARY OVER 700$$      Requested Prescriptions  Pending Prescriptions Disp Refills   GVOKE HYPOPEN  2-PACK 1 MG/0.2ML SOAJ [Pharmacy Med Name: GVOKE HYPOPEN  2-PK 1 MG/0.2 ML]  0    Sig: Inject 1 mg into the skin as needed (hypoglycemia).     Off-Protocol Failed - 06/20/2024  9:06 AM      Failed - Medication not assigned to a protocol, review manually.      Passed - Valid encounter within last 12 months    Recent Outpatient Visits           2 days ago Annual physical exam   [facility] [place] Mars[place] PARAS, DO   2 months ago ALS (amyotrophic lateral sclerosis) [facility])   [facility] Marsa PARAS, DO   3 months ago Scalp pain   [facility] [place] Marsa PARAS, DO   5 months ago Bronchitis   [place] [facility] Marsa PARAS, DO       Future Appointments             In 1 month Stoioff, Glendia BROCKS, MD [facility]

## 2024-06-21 MED ORDER — GLUCAGON (RDNA) 1 MG IJ KIT: PACK | INTRAMUSCULAR | 2 refills | Status: DC

## 2024-07-04 ENCOUNTER — Telehealth: Payer: Self-pay

## 2024-07-04 NOTE — Progress Notes (Signed)
 Complex Care Management Note  Care Guide Note 07/04/2024 Name: DUEL CONRAD MRN: [id number] DOB: 02-Dec-1950  DENZEL ETIENNE is a 73 y.o. year old male who sees Edman Marsa PARAS, DO for primary care. I reached out to [name] by phone today to offer complex care management services.  Mr. Skoda was given information about Complex Care Management services today including:   The Complex Care Management services include support from the care team which includes your Nurse Care Manager, Clinical Social Worker, or Pharmacist.  The Complex Care Management team is here to help remove barriers to the health concerns and goals most important to you. Complex Care Management services are voluntary, and the patient may decline or stop services at any time by request to their care team member.   Complex Care Management Consent Status: Patient agreed to services and verbal consent obtained.   Follow up plan:  Telephone appointment with complex care management team member scheduled for:  07/11/2024  Encounter Outcome:  Patient Scheduled  [name] , RMA     [place]  [facility], [facility] Guide  Direct Dial: [phone]  Website: [email or web address]

## 2024-07-04 NOTE — Progress Notes (Signed)
 Care Guide Pharmacy Note  07/04/2024 Name: Jonathan Shelton MRN: [id number] DOB: February 22, 1951  Referred By: Edman Marsa PARAS, DO Reason for referral: Complex Care Management (Outreach to schedule with RNCM and Pharm d )   Jonathan Shelton is a 73 y.o. year old male who is a primary care patient of Edman Marsa PARAS, DO.  [name] was referred to the pharmacist for assistance related to: DMII  Successful contact was made with the patient to discuss pharmacy services including being ready for the pharmacist to call at least 5 minutes before the scheduled appointment time and to have medication bottles and any blood pressure readings ready for review. The patient agreed to meet with the pharmacist via telephone visit on (date/time).07/09/2024  [name] , RMA     [place]  [facility], [facility] Guide  Direct Dial: [phone]  Website: [email or web address]

## 2024-07-06 ENCOUNTER — Ambulatory Visit

## 2024-07-09 ENCOUNTER — Other Ambulatory Visit (HOSPITAL_COMMUNITY): Payer: Self-pay

## 2024-07-09 ENCOUNTER — Other Ambulatory Visit (INDEPENDENT_AMBULATORY_CARE_PROVIDER_SITE_OTHER): Admitting: Pharmacist

## 2024-07-09 ENCOUNTER — Encounter: Payer: Self-pay | Admitting: Family Medicine

## 2024-07-09 DIAGNOSIS — Z794 Long term (current) use of insulin: Secondary | ICD-10-CM

## 2024-07-09 DIAGNOSIS — E1169 Type 2 diabetes mellitus with other specified complication: Secondary | ICD-10-CM

## 2024-07-09 DIAGNOSIS — Z7985 Long-term (current) use of injectable non-insulin antidiabetic drugs: Secondary | ICD-10-CM

## 2024-07-09 NOTE — Progress Notes (Unsigned)
   07/09/2024 Name: Jonathan Shelton MRN: [id number] DOB: 03-Aug-1951  No chief complaint on file.   Jonathan Shelton is a 73 y.o. year old male who presented for a telephone visit.   They were referred to the pharmacist by their PCP for assistance in managing diabetes.    Subjective:  Care Team: Primary Care Provider: Edman Marsa PARAS, DO ; Next Scheduled Visit: *** {careteamprovider:27366}  Medication Access/Adherence  [facility]  [phone] [place] [place] [place] - [address] [place] [address] Phone: [phone] Fax: [phone]  [facility] [address] [place] [facility] [address] [place] [address] Phone: [phone] Fax: [phone]  [id number] [place] [facility] - [address] [place] [address] Phone: [phone] Fax: [phone]   Patient reports affordability concerns with their medications: {YES/NO:21197} Patient reports access/transportation concerns to their pharmacy: {YES/NO:21197} Patient reports adherence concerns with their medications:  {YES/NO:21197} ***   Diabetes:  Current medications:  - Novolin N 0-10 units nightly at bedtime (depending on eating habits for the day) - Ozempic  2 mg once weekly - Jardiance  25 mg daily - metformin  1000 mg twice daily   Reports using Freestyle Libre 3 Plus continuous glucose monitoring - Receives from [employer] [facility] - Uses App on phone in place of reader device - Has glucometer that he uses when needed as well  Date of Download: 07/09/24 % Time CGM is active: 97% Average Glucose: 130 mg/dL Glucose Management Indicator: 6.4%  Glucose Variability: 24.1% (goal <36%) Time in Goal:  - Time in range 70-180: 94% - Time above range: 6% - Time below range: 0% Observed patterns:  Patient denies recent hypoglycemic s/sx including dizziness, shakiness, sweating.   Current physical activity: ***    Objective:  Lab  Results  Component Value Date   HGBA1C 6.4 (H) 06/06/2024    Lab Results  Component Value Date   CREATININE 0.80 06/06/2024   BUN 18 06/06/2024   NA 139 06/06/2024   K 4.4 06/06/2024   CL 104 06/06/2024   CO2 26 06/06/2024    Lab Results  Component Value Date   CHOL 95 06/06/2024   HDL 41 06/06/2024   LDLCALC 36 06/06/2024   TRIG 96 06/06/2024   CHOLHDL 2.3 06/06/2024    Medications Reviewed Today   Medications were not reviewed in this encounter       Assessment/Plan:   Diabetes: - Currently controlled - Reviewed long term cardiovascular and renal outcomes of uncontrolled blood sugar - Reviewed goal A1c, goal fasting, and goal 2 hour post prandial glucose - Reviewed dietary modifications including *** - Reviewed lifestyle modifications including:  - Recommend to ***  - Recommend to check glucose ***    Follow Up Plan: ***  ***

## 2024-07-11 ENCOUNTER — Other Ambulatory Visit: Payer: Self-pay

## 2024-07-11 NOTE — Patient Outreach (Signed)
 Incoming call from Jonathan Shelton, he declines [facility] RNCM services at this time, states my body may not work but my mind is good, states he has no SDOH needs, communicates with his doctors as needed, no medications concerns, no caregiver concerns. Will close program.

## 2024-07-11 NOTE — Patient Instructions (Addendum)
 The goal A1c is less than 7%. This is the best way to reduce the risk of the long term complications of diabetes, including heart disease, kidney disease, eye disease, strokes, and nerve damage. An A1c of less than 7% corresponds with fasting sugars less than 130 and 2 hour after meal sugars less than 180.  Hypoglycemia is low blood glucose--or low blood sugar--that is below the healthy range. This is usually  when your blood glucose is less than 70 mg/dL, but you should also monitor for hypoglycemia based on symptoms.  Symptoms of hypoglycemia may include feeling: - Shaky - Sweaty - Dizziness - Confusion or difficulty speaking - Feeling weak or tired - Or you may have no symptoms at all   If low blood glucose is not treated, it can become severe and may cause you to pass out   Check your blood glucose right away if you have any symptoms of low blood sugar ( If you think your blood glucose is low but cannot check it at that time, treat anyway)  Treat by eating or drinking 15 grams of something high in sugar, such as:  4 ounces ( cup) of regular fruit juice (like orange, apple, or grape juice)  4 glucose tablets or 1 tube of glucose gel  1 tablespoon of sugar, honey, or corn syrup  4 ounces ( cup) of regular soda pop (not diet)  2 tablespoons of raisins  Wait 15 minutes and then check your blood glucose again - If it is still low, eat or drink something high in sugar again -  If your next meal is more than an hour away, eat a snack to keep your low blood glucose from coming back  Please contact your provider's office if you have concerns about your blood sugar, particularly if you have symptoms of low blood sugar.  Sharyle Sia, PharmD, JAQUELINE, CPP Clinical Pharmacist [facility] [phone]

## 2024-07-19 ENCOUNTER — Encounter: Payer: Self-pay | Admitting: Gastroenterology

## 2024-07-22 ENCOUNTER — Other Ambulatory Visit: Payer: Self-pay | Admitting: Family Medicine

## 2024-07-22 DIAGNOSIS — E538 Deficiency of other specified B group vitamins: Secondary | ICD-10-CM

## 2024-07-23 NOTE — Telephone Encounter (Signed)
 Requested Prescriptions  Pending Prescriptions Disp Refills   cyanocobalamin  (VITAMIN B12) 1000 MCG/ML injection [Pharmacy Med Name: CYANOCOBALAMIN  1,000 MCG/ML VL] 3 mL 1    Sig: INJECT 1 ML (1,000 MCG TOTAL) INTO THE MUSCLE EVERY 30 DAYS.     Endocrinology:  Vitamins - Vitamin B12 Passed - 07/23/2024  4:25 PM      Passed - HCT in normal range and within 360 days    HCT  Date Value Ref Range Status  06/06/2024 46.5 38.5 - 50.0 % Final         Passed - HGB in normal range and within 360 days    Hemoglobin  Date Value Ref Range Status  06/06/2024 15.3 13.2 - 17.1 g/dL Final         Passed - B12 Level in normal range and within 360 days    Vitamin B-12  Date Value Ref Range Status  06/06/2024 688 200 - 1,100 pg/mL Final         Passed - Valid encounter within last 12 months    Recent Outpatient Visits           1 month ago Annual physical exam   [place] [facility] Edman Marsa PARAS, DO   3 months ago ALS (amyotrophic lateral sclerosis) [facility])   [place] [facility] Edman Marsa PARAS, DO   4 months ago Scalp pain   [place] [facility] [place], Marsa PARAS, DO   6 months ago Bronchitis   [place] [facility] [place], Marsa PARAS, DO       Future Appointments             In 3 weeks Stoioff, Glendia BROCKS, MD [facility]

## 2024-07-24 ENCOUNTER — Ambulatory Visit: Admitting: Urology

## 2024-07-26 NOTE — Patient Instructions (Signed)
 Visit Information  Thank you for taking time to visit with me today. Please don't hesitate to contact me if I can be of assistance to you.   Our next appointment is: no further appointments scheduled due to declining case management services at this time.  Please call the care guide team at [phone] if you find you need case management services.    Following is a copy of your care plan:   Goals Addressed   None     A reminder to ALL patients/family/friends, please call the [place]  National Suicide Prevention Lifeline: 706 379 0748 or TTY: 647-246-1610 TTY (386) [phone]) to talk to a trained counselor if you are experiencing a Mental Health or Behavioral Health Crisis or need someone to talk to.   Santana Stamp BSN, CCM   [facility] Population Health RN Care Manager Direct Dial: [phone]  Fax: [phone]

## 2024-08-04 ENCOUNTER — Other Ambulatory Visit: Payer: Self-pay | Admitting: Family Medicine

## 2024-08-04 ENCOUNTER — Encounter: Payer: Self-pay | Admitting: Family Medicine

## 2024-08-04 DIAGNOSIS — Z794 Long term (current) use of insulin: Secondary | ICD-10-CM

## 2024-08-04 DIAGNOSIS — E1169 Type 2 diabetes mellitus with other specified complication: Secondary | ICD-10-CM

## 2024-08-06 MED ORDER — METFORMIN HCL 1000 MG PO TABS: 1000.0000 mg | ORAL_TABLET | Freq: Two times a day (BID) | ORAL | 0 refills | Status: DC

## 2024-08-06 NOTE — Telephone Encounter (Signed)
 Refused Metformin  because it was sent in earlier today (08/06/2024) [id number].  This is a duplicate.

## 2024-08-14 ENCOUNTER — Encounter: Payer: Self-pay | Admitting: Urology

## 2024-08-14 ENCOUNTER — Ambulatory Visit: Admitting: Urology

## 2024-08-14 VITALS — BP 137/77 | HR 77 | Ht 69.0 in | Wt 204.0 lb

## 2024-08-14 DIAGNOSIS — R399 Unspecified symptoms and signs involving the genitourinary system: Secondary | ICD-10-CM | POA: Diagnosis not present

## 2024-08-14 LAB — URINALYSIS, COMPLETE
Bilirubin, UA: NEGATIVE
Ketones, UA: NEGATIVE
Leukocytes,UA: NEGATIVE
Nitrite, UA: NEGATIVE
RBC, UA: NEGATIVE
Specific Gravity, UA: 1.02 (ref 1.005–1.030)
Urobilinogen, Ur: 0.2 mg/dL (ref 0.2–1.0)
pH, UA: 6 (ref 5.0–7.5)

## 2024-08-14 LAB — MICROSCOPIC EXAMINATION

## 2024-08-14 LAB — BLADDER SCAN AMB NON-IMAGING

## 2024-08-14 NOTE — Progress Notes (Addendum)
 08/14/2024 12:19 PM   Jonathan Shelton Nov 20, 1950 [id number]  Referring provider: Edman Marsa PARAS, DO [address] [place],  [place] [address]  Chief Complaint  Patient presents with   Benign Prostatic Hypertrophy    HPI: Jonathan Shelton is a 73 y.o. male referred for evaluation of BPH.  Was initially referred October 2021 for severe lower urinary tract symptoms with IPSS 35/35. No improvement on tamsulosin  Cystoscopy showed minimal lateral lobe enlargement and bladder neck elevation.  TRUS prostate for volume was performed with prostate volume calculated at 24 g My last note mention he had noted improvement on tamsulosin  0.8 mg though he states it was not effective No significant change in his voiding symptoms since his last visit with complaints of incomplete emptying, frequency, intermittent urinary stream, urgency, weak stream, straining to urinate and nocturia x 3.  IPSS 32/35. Intermittent dysuria; no gross hematuria or flank/abdominal/pelvic pain Medication dispense history reviewed.  He had been on finasteride  and this was last filled January 2025 x 90 days.  Tamsulosin  was last filled August 2024 x 90 days No worsening of his lower urinary tract symptoms off these medications Since last visit has been diagnosed with ALS   PMH: Past Medical History:  Diagnosis Date   Allergy    ALS (amyotrophic lateral sclerosis) (HCC)    Arthritis    Basal cell carcinoma    Face. x2. Mohs surgery in [place].   BPH (benign prostatic hyperplasia)    Colon polyps    Diabetes (HCC)    Diverticulosis    Gallstones    GERD (gastroesophageal reflux disease)    HOH (hard of hearing)    Kidney stones    Renal disorder    Wears glasses     Surgical History: Past Surgical History:  Procedure Laterality Date   BIOPSY  01/22/2021   Procedure: BIOPSY;  Surgeon: Wilhelmenia, Aloha Raddle., MD;  Location[facility] ENDOSCOPY;  Service: Gastroenterology;;   CARPAL TUNNEL RELEASE      CHOLECYSTECTOMY     COLONOSCOPY WITH PROPOFOL  N/A 02/06/2020   Procedure: COLONOSCOPY WITH PROPOFOL ;  Surgeon: Unk Corinn Skiff, MD;  Location: [facility] ENDOSCOPY;  Service: Gastroenterology;  Laterality: N/A;   COLONOSCOPY WITH PROPOFOL  N/A 03/31/2020   Procedure: COLONOSCOPY WITH PROPOFOL ;  Surgeon: Wilhelmenia Aloha Raddle., MD;  Location: [facility] ENDOSCOPY;  Service: Gastroenterology;  Laterality: N/A;   COLONOSCOPY WITH PROPOFOL  N/A 01/22/2021   Procedure: COLONOSCOPY WITH PROPOFOL ;  Surgeon: Mansouraty, Aloha Raddle., MD;  Location: [facility] ENDOSCOPY;  Service: Gastroenterology;  Laterality: N/A;   ENDOSCOPIC MUCOSAL RESECTION N/A 03/31/2020   Procedure: ENDOSCOPIC MUCOSAL RESECTION;  Surgeon: Wilhelmenia Aloha Raddle., MD;  Location: [facility] ENDOSCOPY;  Service: Gastroenterology;  Laterality: N/A;   HEMOSTASIS CLIP PLACEMENT  03/31/2020   Procedure: HEMOSTASIS CLIP PLACEMENT;  Surgeon: Wilhelmenia Aloha Raddle., MD;  Location: [facility] ENDOSCOPY;  Service: Gastroenterology;;   HERNIA REPAIR     IR KYPHO LUMBAR INC FX REDUCE BONE BX UNI/BIL CANNULATION INC/IMAGING  02/05/2021   IR RADIOLOGIST EVAL & MGMT  11/28/2020   IR RADIOLOGIST EVAL & MGMT  02/26/2021   POLYPECTOMY  03/31/2020   Procedure: POLYPECTOMY;  Surgeon: Wilhelmenia Aloha Raddle., MD;  Location: [facility] ENDOSCOPY;  Service: Gastroenterology;;   POLYPECTOMY  01/22/2021   Procedure: POLYPECTOMY;  Surgeon: Wilhelmenia Aloha Raddle., MD;  Location: [facility] ENDOSCOPY;  Service: Gastroenterology;;   SMALL INTESTINE SURGERY     SUBMUCOSAL LIFTING INJECTION  03/31/2020   Procedure: SUBMUCOSAL LIFTING INJECTION;  Surgeon: Wilhelmenia Aloha Raddle., MD;  Location:  [facility] ENDOSCOPY;  Service: Gastroenterology;;   TOTAL KNEE ARTHROPLASTY Right 08/31/2021   Procedure: TOTAL KNEE ARTHROPLASTY;  Surgeon: Leora Lynwood SAUNDERS, MD;  Location: [facility] ORS;  Service: Orthopedics;  Laterality: Right;    Home Medications:  Allergies as of 08/14/2024       Reactions   Testosterone Rash   Adhesive patch         Medication List        Accurate as of August 14, 2024 12:19 PM. If you have any questions, ask your nurse or doctor.          STOP taking these medications    albuterol  108 (90 Base) MCG/ACT inhaler Commonly known as: VENTOLIN  HFA Stopped by: Glendia JAYSON Barba   aspirin  81 MG chewable tablet Stopped by: Glendia JAYSON Barba   azelastine  0.1 % nasal spray Commonly known as: ASTELIN  Stopped by: Hattie Aguinaldo C Belanna Manring   Cholecalciferol 10 MCG (400 UNIT) Chew Stopped by: Glendia JAYSON Barba   cholecalciferol 25 MCG (1000 UNIT) tablet Commonly known as: VITAMIN D3 Stopped by: Glendia JAYSON Barba   finasteride  5 MG tablet Commonly known as: PROSCAR  Stopped by: Glendia JAYSON Barba   glucagon  1 MG injection Stopped by: Glendia JAYSON Barba   glucose blood test strip Stopped by: Glendia JAYSON Barba   hydrocortisone  25 MG suppository Commonly known as: ANUSOL -HC Stopped by: Glendia JAYSON Barba   meloxicam  7.5 MG tablet Commonly known as: MOBIC  Stopped by: Glendia JAYSON Barba   oxyCODONE -acetaminophen  5-325 MG tablet Commonly known as: Percocet Stopped by: Glendia JAYSON Barba   pimecrolimus  1 % cream Commonly known as: Elidel  Stopped by: Glendia JAYSON Barba       TAKE these medications    baclofen 10 MG tablet Commonly known as: LIORESAL Take by mouth.   Cinnamon 500 MG Tabs Take by mouth.   cyanocobalamin  1000 MCG/ML injection Commonly known as: VITAMIN B12 INJECT 1 ML (1,000 MCG TOTAL) INTO THE MUSCLE EVERY 30 DAYS.   Fish Oil 1000 MG Caps Take by mouth.   insulin  NPH Human 100 UNIT/ML injection Commonly known as: NovoLIN N INJECT 40 UNITS (0.4 ML) INTO THE SKIN AT BEDTIME. MAY USE REDUCED DOSE AS ADVISED.(NOT COVERED)   Insulin  Syringe-Needle U-100 31G X 1/4 0.5 ML Misc Use syringe to inject insulin  into skin daily.   Jardiance  25 MG Tabs tablet Generic drug: empagliflozin  TAKE 1 TABLET (25 MG TOTAL) BY MOUTH DAILY.   magnesium gluconate 500 MG tablet Commonly known as:  MAGONATE Take 500 mg by mouth 2 (two) times daily.   metFORMIN  1000 MG tablet Commonly known as: GLUCOPHAGE  Take 1 tablet (1,000 mg total) by mouth 2 (two) times daily with a meal.   naproxen  sodium 220 MG tablet Commonly known as: ALEVE  Take 220 mg by mouth. 3x times per day   Nuedexta 20-10 MG capsule Generic drug: Dextromethorphan -quiNIDine Take by mouth.   Ozempic  (2 MG/DOSE) 8 MG/3ML Sopn Generic drug: Semaglutide  (2 MG/DOSE) Inject 2 mg into the skin once a week.   Repatha  140 MG/ML Sosy Generic drug: Evolocumab  Inject 140 mg into the skin every 14 (fourteen) days.   Taurine 500 MG Caps Take by mouth.   TURMERIC CURCUMIN PO Take by mouth.   VITAMIN C PO Take 1 tablet by mouth 3 (three) times a week.   Vitamin E /D-Alpha Natural 268 MG (400 UNIT) Caps Generic drug: Vitamin E  Take by mouth.   zinc  gluconate 50 MG tablet Take 50 mg by mouth 3 (three) times a week.  Allergies:  Allergies  Allergen Reactions   Testosterone Rash    Adhesive patch    Family History: Family History  Problem Relation Age of Onset   Heart disease Mother 32   Heart disease Father    Stroke Father 49   Diabetes Father    Heart attack Father    Liver disease Neg Hx    Colon cancer Neg Hx    Esophageal cancer Neg Hx    Pancreatic cancer Neg Hx    Stomach cancer Neg Hx    Inflammatory bowel disease Neg Hx    Rectal cancer Neg Hx     Social History:  reports that he quit smoking about 55 years ago. His smoking use included cigarettes. He has quit using smokeless tobacco.  His smokeless tobacco use included snuff. He reports that he does not currently use alcohol. He reports that he does not currently use drugs.   Physical Exam: BP 137/77   Pulse 77   Ht 5' 9 (1.753 m)   Wt 204 lb (92.5 kg)   BMI 30.13 kg/m   Constitutional:  Alert, No acute distress. HEENT: [place] AT Respiratory: Normal respiratory effort, no increased work of breathing. Psychiatric: Normal mood  and affect.  Laboratory Data:  Urinalysis Dipstick 3+ glucose/trace protein Microscopy negative   Assessment & Plan:    1.  Lower urinary tract symptoms Cystoscopy and TRUS 2021 showed no significant prostate enlargement No improvement in symptoms on tamsulosin  or finasteride  Symptoms are bothersome and recommend urodynamic study.  We discussed we do not have the equipment in [place] and will need to referral out.  They requested [facility] and referral placed PVR today 75 mL   Glendia JAYSON Barba, MD  [facility] [address], [address] [place], [place] [address] [phone]

## 2024-08-16 ENCOUNTER — Encounter: Payer: Self-pay | Admitting: Family Medicine

## 2024-08-16 ENCOUNTER — Other Ambulatory Visit: Payer: Self-pay | Admitting: Family Medicine

## 2024-08-16 DIAGNOSIS — E1169 Type 2 diabetes mellitus with other specified complication: Secondary | ICD-10-CM

## 2024-08-16 DIAGNOSIS — Z1211 Encounter for screening for malignant neoplasm of colon: Secondary | ICD-10-CM

## 2024-08-17 ENCOUNTER — Ambulatory Visit

## 2024-08-18 ENCOUNTER — Other Ambulatory Visit: Payer: Self-pay | Admitting: Family Medicine

## 2024-08-18 DIAGNOSIS — E1169 Type 2 diabetes mellitus with other specified complication: Secondary | ICD-10-CM

## 2024-08-21 ENCOUNTER — Encounter: Payer: Self-pay | Admitting: Family Medicine

## 2024-08-21 ENCOUNTER — Telehealth: Payer: Self-pay

## 2024-08-21 MED ORDER — REPATHA 140 MG/ML ~~LOC~~ SOSY: 1.0000 | PREFILLED_SYRINGE | SUBCUTANEOUS | 11 refills | Status: AC

## 2024-08-21 NOTE — Telephone Encounter (Signed)
 Copied from CRM #[id number]. Topic: Appointments - Scheduling Inquiry for Clinic >> Aug 21, 2024  1:42 PM Jonathan Shelton wrote: Reason for CRM: Patient has a telephone AWV 10/8. Patient needs to know which phone number the call will be coming from so he can add it to his phone, otherwise his phone will not ring. Please send a message in mychart to the patient with the phone number.

## 2024-08-21 NOTE — Telephone Encounter (Signed)
 Pharmacy comment: Script Clarification:CAN WE SWITCH TO [facility] [phone] REPATHA  140 MG/ML 2 1 ML PREFILLED SYRINGES OUR SYSTEM DOES NOT AUTHORIZE US  TO CHANGE FROM OTHER NDC.

## 2024-08-22 ENCOUNTER — Ambulatory Visit: Admitting: Emergency Medicine

## 2024-08-22 ENCOUNTER — Other Ambulatory Visit: Payer: Self-pay | Admitting: Family Medicine

## 2024-08-22 VITALS — Ht 69.0 in | Wt 203.0 lb

## 2024-08-22 DIAGNOSIS — E1169 Type 2 diabetes mellitus with other specified complication: Secondary | ICD-10-CM

## 2024-08-22 DIAGNOSIS — Z0000 Encounter for general adult medical examination without abnormal findings: Secondary | ICD-10-CM

## 2024-08-22 NOTE — Patient Instructions (Signed)
 Jonathan Shelton,  Thank you for taking the time for your Medicare Wellness Visit. I appreciate your continued commitment to your health goals. Please review the care plan we discussed, and feel free to reach out if I can assist you further.  Medicare recommends these wellness visits once per year to help you and your care team stay ahead of potential health issues. These visits are designed to focus on prevention, allowing your provider to concentrate on managing your acute and chronic conditions during your regular appointments.  Please note that Annual Wellness Visits do not include a physical exam. Some assessments may be limited, especially if the visit was conducted virtually. If needed, we may recommend a separate in-person follow-up with your provider.  Ongoing Care Seeing your primary care provider every 3 to 6 months helps us  monitor your health and provide consistent, personalized care.   Referrals If a referral was made during today's visit and you haven't received any updates within two weeks, please contact the referred provider directly to check on the status.  Recommended Screenings:  Call [facility] @ [phone] to schedule a colonoscopy.  Health Maintenance  Topic Date Due   COVID-19 Vaccine (1) Never done   Colon Cancer Screening  01/23/2024   Zoster (Shingles) Vaccine (1 of 2) 09/18/2024*   Flu Shot  02/12/2025*   Pneumococcal Vaccine for age over 42 (2 of 2 - PPSV23, PCV20, or PCV21) 06/18/2025*   Hemoglobin A1C  12/07/2024   Complete foot exam   12/13/2024   Eye exam for diabetics  03/05/2025   Yearly kidney function blood test for diabetes  06/06/2025   Yearly kidney health urinalysis for diabetes  06/06/2025   Medicare Annual Wellness Visit  08/22/2025   Hepatitis C Screening  Completed   Meningitis B Vaccine  Aged Out   DTaP/Tdap/Td vaccine  Discontinued   Cologuard (Stool DNA test)  Discontinued  *Topic was postponed. The date shown is not the original due  date.       08/22/2024    2:58 PM  Advanced Directives  Does Patient Have a Medical Advance Directive? Yes  Type of Estate agent of [place];Living will  Does patient want to make changes to medical advance directive? No - Patient declined  Copy of Healthcare Power of Attorney in Chart? No - copy requested   Advance Care Planning is important because it: Ensures you receive medical care that aligns with your values, goals, and preferences. Provides guidance to your family and loved ones, reducing the emotional burden of decision-making during critical moments.  Vision: Annual vision screenings are recommended for early detection of glaucoma, cataracts, and diabetic retinopathy. These exams can also reveal signs of chronic conditions such as diabetes and high blood pressure.  Dental: Annual dental screenings help detect early signs of oral cancer, gum disease, and other conditions linked to overall health, including heart disease and diabetes.  Please see the attached documents for additional preventive care recommendations.    Fall Prevention in the Home, Adult Falls can cause injuries and affect people of all ages. There are many simple things that you can do to make your home safe and to help prevent falls. If you need it, ask for help making these changes. What actions can I take to prevent falls? General information Use good lighting in all rooms. Make sure to: Replace any light bulbs that burn out. Turn on lights if it is dark and use night-lights. Keep items that you use often in  easy-to-reach places. Lower the shelves around your home if needed. Move furniture so that there are clear paths around it. Do not keep throw rugs or other things on the floor that can make you trip. If any of your floors are uneven, fix them. Add color or contrast paint or tape to clearly mark and help you see: Grab bars or handrails. First and last steps of staircases. Where  the edge of each step is. If you use a ladder or stepladder: Make sure that it is fully opened. Do not climb a closed ladder. Make sure the sides of the ladder are locked in place. Have someone hold the ladder while you use it. Know where your pets are as you move through your home. What can I do in the bathroom?     Keep the floor dry. Clean up any water  that is on the floor right away. Remove soap buildup in the bathtub or shower. Buildup makes bathtubs and showers slippery. Use non-skid mats or decals on the floor of the bathtub or shower. Attach bath mats securely with double-sided, non-slip rug tape. If you need to sit down while you are in the shower, use a non-slip stool. Install grab bars by the toilet and in the bathtub and shower. Do not use towel bars as grab bars. What can I do in the bedroom? Make sure that you have a light by your bed that is easy to reach. Do not use any sheets or blankets on your bed that hang to the floor. Have a firm bench or chair with side arms that you can use for support when you get dressed. What can I do in the kitchen? Clean up any spills right away. If you need to reach something above you, use a sturdy step stool that has a grab bar. Keep electrical cables out of the way. Do not use floor polish or wax that makes floors slippery. What can I do with my stairs? Do not leave anything on the stairs. Make sure that you have a light switch at the top and the bottom of the stairs. Have them installed if you do not have them. Make sure that there are handrails on both sides of the stairs. Fix handrails that are broken or loose. Make sure that handrails are as long as the staircases. Install non-slip stair treads on all stairs in your home if they do not have carpet. Avoid having throw rugs at the top or bottom of stairs, or secure the rugs with carpet tape to prevent them from moving. Choose a carpet design that does not hide the edge of steps on the  stairs. Make sure that carpet is firmly attached to the stairs. Fix any carpet that is loose or worn. What can I do on the outside of my home? Use bright outdoor lighting. Repair the edges of walkways and driveways and fix any cracks. Clear paths of anything that can make you trip, such as tools or rocks. Add color or contrast paint or tape to clearly mark and help you see high doorway thresholds. Trim any bushes or trees on the main path into your home. Check that handrails are securely fastened and in good repair. Both sides of all steps should have handrails. Install guardrails along the edges of any raised decks or porches. Have leaves, snow, and ice cleared regularly. Use sand, salt, or ice melt on walkways during winter months if you live where there is ice and snow. In the garage,  clean up any spills right away, including grease or oil spills. What other actions can I take? Review your medicines with your health care provider. Some medicines can make you confused or feel dizzy. This can increase your chance of falling. Wear closed-toe shoes that fit well and support your feet. Wear shoes that have rubber soles and low heels. Use a cane, walker, scooter, or crutches that help you move around if needed. Talk with your provider about other ways that you can decrease your risk of falls. This may include seeing a physical therapist to learn to do exercises to improve movement and strength. Where to find more information Centers for Disease Control and Prevention, [employer]: TonerPromos.no [employer] on Aging: BaseRingTones.pl National Institute on Aging: BaseRingTones.pl Contact a health care provider if: You are afraid of falling at home. You feel weak, drowsy, or dizzy at home. You fall at home. Get help right away if you: Lose consciousness or have trouble moving after a fall. Have a fall that causes a head injury. These symptoms may be an emergency. Get help right away. Call 911. Do not wait to  see if the symptoms will go away. Do not drive yourself to the hospital. This information is not intended to replace advice given to you by your health care provider. Make sure you discuss any questions you have with your health care provider. Document Revised: 07/05/2022 Document Reviewed: 07/05/2022 Elsevier Patient Education  2024 [employer].

## 2024-08-22 NOTE — Progress Notes (Signed)
 Subjective:   Jonathan Shelton is a 73 y.o. who presents for a Medicare Wellness preventive visit.  As a reminder, Annual Wellness Visits don't include a physical exam, and some assessments may be limited, especially if this visit is performed virtually. We may recommend an in-person follow-up visit with your provider if needed.  Visit Complete: Virtual I connected with  [name] on 08/22/24 by a audio enabled telemedicine application and verified that I am speaking with the correct person using two identifiers.  Patient Location: Home  Provider Location: Home Office  I discussed the limitations of evaluation and management by telemedicine. The patient expressed understanding and agreed to proceed.  Vital Signs: Because this visit was a virtual/telehealth visit, some criteria may be missing or patient reported. Any vitals not documented were not able to be obtained and vitals that have been documented are patient reported.  VideoDeclined- This patient declined Librarian, academic. Therefore the visit was completed with audio only.  Persons Participating in Visit: Patient.  AWV Questionnaire: No: Patient Medicare AWV questionnaire was not completed prior to this visit.  Cardiac Risk Factors include: advanced age (>23men, >8 women);male gender;diabetes mellitus;dyslipidemia;hypertension;sedentary lifestyle     Objective:    Today's Vitals   08/22/24 1437  Weight: 203 lb (92.1 kg)  Height: 5' 9 (1.753 m)   Body mass index is 29.98 kg/m.     08/22/2024    2:58 PM 04/02/2024    6:17 PM 02/19/2024    8:27 PM 05/29/2023   12:15 PM 05/12/2023    2:11 PM 04/30/2022    2:56 PM 08/31/2021    4:52 PM  Advanced Directives  Does Patient Have a Medical Advance Directive? Yes Yes No No No Yes Yes  Type of Estate agent of [place];Living will Healthcare Power of [place];Living will    Healthcare Power of [place];Living will Living  will  Does patient want to make changes to medical advance directive? No - Patient declined     Yes (Inpatient - patient defers changing a medical advance directive and declines information at this time) No - Patient declined  Copy of Healthcare Power of Attorney in Chart? No - copy requested     No - copy requested   Would patient like information on creating a medical advance directive?   No - Patient declined No - Patient declined No - Patient declined      Current Medications (verified) Outpatient Encounter Medications as of 08/22/2024  Medication Sig   Ascorbic Acid  (VITAMIN C PO) Take 1 tablet by mouth 3 (three) times a week.   b complex vitamins capsule Take 1 capsule by mouth daily.   baclofen (LIORESAL) 10 MG tablet Take by mouth.   Cinnamon 500 MG TABS Take by mouth.   cyanocobalamin  (VITAMIN B12) 1000 MCG/ML injection INJECT 1 ML (1,000 MCG TOTAL) INTO THE MUSCLE EVERY 30 DAYS.   Dextromethorphan -quiNIDine (NUEDEXTA) 20-10 MG capsule Take by mouth. (Patient taking differently: Take 1 capsule by mouth 2 (two) times daily.)   empagliflozin  (JARDIANCE ) 25 MG TABS tablet TAKE 1 TABLET (25 MG TOTAL) BY MOUTH DAILY.   insulin  NPH Human (NOVOLIN N) 100 UNIT/ML injection INJECT 40 UNITS (0.4 ML) INTO THE SKIN AT BEDTIME. MAY USE REDUCED DOSE AS ADVISED.(NOT COVERED)   Insulin  Syringe-Needle U-100 31G X 1/4 0.5 ML MISC Use syringe to inject insulin  into skin daily.   magnesium gluconate (MAGONATE) 500 MG tablet Take 500 mg by mouth 2 (two) times  daily.   metFORMIN  (GLUCOPHAGE ) 1000 MG tablet Take 1 tablet (1,000 mg total) by mouth 2 (two) times daily with a meal.   naproxen  sodium (ALEVE ) 220 MG tablet Take 220 mg by mouth. 3x times per day (Patient taking differently: Take 220 mg by mouth. 3x times per day Taking PRN)   NON FORMULARY Take 1 tablet by mouth daily. Reversatrol   Omega-3 Fatty Acids (FISH OIL) 1000 MG CAPS Take by mouth.   OZEMPIC , 2 MG/DOSE, 8 MG/3ML SOPN INJECT 2 MG INTO  THE SKIN ONCE A WEEK.   REPATHA  140 MG/ML SOSY Inject 140 mg into the skin every 14 (fourteen) days.   TURMERIC CURCUMIN PO Take by mouth.   Vitamin E  (VITAMIN E /D-ALPHA NATURAL) 268 MG (400 UNIT) CAPS Take by mouth.   zinc  gluconate 50 MG tablet Take 50 mg by mouth 3 (three) times a week.   Taurine 500 MG CAPS Take by mouth. (Patient not taking: Reported on 08/22/2024)   No facility-administered encounter medications on file as of 08/22/2024.    Allergies (verified) Testosterone   History: Past Medical History:  Diagnosis Date   Allergy    ALS (amyotrophic lateral sclerosis) (HCC)    Arthritis    Basal cell carcinoma    Face. x2. Mohs surgery in [place].   BPH (benign prostatic hyperplasia)    Colon polyps    Diabetes (HCC)    Diverticulosis    Gallstones    GERD (gastroesophageal reflux disease)    HOH (hard of hearing)    Kidney stones    Renal disorder    Wears glasses    Past Surgical History:  Procedure Laterality Date   BIOPSY  01/22/2021   Procedure: BIOPSY;  Surgeon: Wilhelmenia, Aloha Raddle., MD;  Location[facility] ENDOSCOPY;  Service: Gastroenterology;;   CARPAL TUNNEL RELEASE     CHOLECYSTECTOMY     COLONOSCOPY WITH PROPOFOL  N/A 02/06/2020   Procedure: COLONOSCOPY WITH PROPOFOL ;  Surgeon: Unk Corinn Skiff, MD;  Location: [facility] ENDOSCOPY;  Service: Gastroenterology;  Laterality: N/A;   COLONOSCOPY WITH PROPOFOL  N/A 03/31/2020   Procedure: COLONOSCOPY WITH PROPOFOL ;  Surgeon: Mansouraty, Aloha Raddle., MD;  Location[facility] ENDOSCOPY;  Service: Gastroenterology;  Laterality: N/A;   COLONOSCOPY WITH PROPOFOL  N/A 01/22/2021   Procedure: COLONOSCOPY WITH PROPOFOL ;  Surgeon: Mansouraty, Aloha Raddle., MD;  Location: [facility] ENDOSCOPY;  Service: Gastroenterology;  Laterality: N/A;   ENDOSCOPIC MUCOSAL RESECTION N/A 03/31/2020   Procedure: ENDOSCOPIC MUCOSAL RESECTION;  Surgeon: Wilhelmenia Aloha Raddle., MD;  Location: [facility] ENDOSCOPY;  Service: Gastroenterology;  Laterality: N/A;   HEMOSTASIS CLIP  PLACEMENT  03/31/2020   Procedure: HEMOSTASIS CLIP PLACEMENT;  Surgeon: Wilhelmenia Aloha Raddle., MD;  Location[facility] ENDOSCOPY;  Service: Gastroenterology;;   HERNIA REPAIR     IR KYPHO LUMBAR INC FX REDUCE BONE BX UNI/BIL CANNULATION INC/IMAGING  02/05/2021   IR RADIOLOGIST EVAL & MGMT  11/28/2020   IR RADIOLOGIST EVAL & MGMT  02/26/2021   POLYPECTOMY  03/31/2020   Procedure: POLYPECTOMY;  Surgeon: Wilhelmenia Aloha Raddle., MD;  Location: [facility] ENDOSCOPY;  Service: Gastroenterology;;   POLYPECTOMY  01/22/2021   Procedure: POLYPECTOMY;  Surgeon: Wilhelmenia Aloha Raddle., MD;  Location: [facility] ENDOSCOPY;  Service: Gastroenterology;;   SMALL INTESTINE SURGERY     SUBMUCOSAL LIFTING INJECTION  03/31/2020   Procedure: SUBMUCOSAL LIFTING INJECTION;  Surgeon: Wilhelmenia Aloha Raddle., MD;  Location: [facility] ENDOSCOPY;  Service: Gastroenterology;;   TOTAL KNEE ARTHROPLASTY Right 08/31/2021   Procedure: TOTAL KNEE ARTHROPLASTY;  Surgeon: Leora Lynwood SAUNDERS, MD;  Location:  [facility] ORS;  Service: Orthopedics;  Laterality: Right;   Family History  Problem Relation Age of Onset   Heart disease Mother 45   Heart disease Father    Stroke Father 67   Diabetes Father    Heart attack Father    Liver disease Neg Hx    Colon cancer Neg Hx    Esophageal cancer Neg Hx    Pancreatic cancer Neg Hx    Stomach cancer Neg Hx    Inflammatory bowel disease Neg Hx    Rectal cancer Neg Hx    Social History   Socioeconomic History   Marital status: Married    Spouse name: Inocente   Number of children: 3   Years of education: Graduate Degree   Highest education level: Master's degree (e.g., MA, MS, MEng, MEd, MSW, MBA)  Occupational History   Occupation: retired  Tobacco Use   Smoking status: Former    Current packs/day: 0.00    Types: Cigarettes    Quit date: 1970    Years since quitting: 55.8   Smokeless tobacco: Former    Types: Snuff    Quit date: 1973  Vaping Use   Vaping status: Never Used  Substance and Sexual Activity    Alcohol use: Not Currently   Drug use: Not Currently    Comment: past   Sexual activity: Not on file  Other Topics Concern   Not on file  Social History Narrative   Not on file   Social Drivers of Health   Financial Resource Strain: Low Risk  (08/22/2024)   Overall Financial Resource Strain (CARDIA)    Difficulty of Paying Living Expenses: Not hard at all  Food Insecurity: No Food Insecurity (08/22/2024)   Hunger Vital Sign    Worried About Running Out of Food in the Last Year: Never true    Ran Out of Food in the Last Year: Never true  Transportation Needs: No Transportation Needs (08/22/2024)   PRAPARE - Administrator, Civil Service (Medical): No    Lack of Transportation (Non-Medical): No  Physical Activity: Inactive (08/22/2024)   Exercise Vital Sign    Days of Exercise per Week: 0 days    Minutes of Exercise per Session: 0 min  Stress: No Stress Concern Present (08/22/2024)   [employer] of Occupational Health - Occupational Stress Questionnaire    Feeling of Stress: Not at all  Social Connections: Moderately Integrated (08/22/2024)   Social Connection and Isolation Panel    Frequency of Communication with Friends and Family: More than three times a week    Frequency of Social Gatherings with Friends and Family: More than three times a week    Attends Religious Services: More than 4 times per year    Active Member of [employer] or Organizations: No    Attends Engineer, structural: Never    Marital Status: Married    Tobacco Counseling Counseling given: Not Answered    Clinical Intake:  Pre-visit preparation completed: Yes  Pain : No/denies pain     BMI - recorded: 29.98 Nutritional Status: BMI 25 -29 Overweight Nutritional Risks: None Diabetes: Yes CBG done?: No (FBS 88 per patient) Did pt. bring in CBG monitor from home?: No  Lab Results  Component Value Date   HGBA1C 6.4 (H) 06/06/2024   HGBA1C 5.7 (A) 12/14/2023   HGBA1C 7.0  (H) 06/10/2023     How often do you need to have someone help you when you read instructions, pamphlets,  or other written materials from your doctor or pharmacy?: 1 - Never  Interpreter Needed?: No  Information entered by :: Vina Ned, CMA   Activities of Daily Living     08/22/2024    2:41 PM  In your present state of health, do you have any difficulty performing the following activities:  Hearing? 0  Vision? 0  Difficulty concentrating or making decisions? 0  Walking or climbing stairs? 1  Comment uses a rollator  Dressing or bathing? 0  Doing errands, shopping? 1  Comment doesn't drive, wife takes to appointments  Preparing Food and eating ? N  Using the Toilet? N  In the past six months, have you accidently leaked urine? N  Do you have problems with loss of bowel control? N  Managing your Medications? N  Managing your Finances? N  Housekeeping or managing your Housekeeping? N    Patient Care Team: Edman Marsa PARAS, DO as PCP - General (Family Medicine) [place], [facility] [place], Glendia BROCKS, MD (Urology) Ferrel Autumn Kari, PA-C (Neurology) Kip Lynwood Double, PA-C as Physician Assistant (Orthopedic Surgery)  I have updated your Care Teams any recent Medical Services you may have received from other providers in the past year.     Assessment:   This is a routine wellness examination for [place].  Hearing/Vision screen Hearing Screening - Comments:: Denies hearing loss  Vision Screening - Comments:: Gets DM eye exams, [facility] [place]   Goals Addressed             This Visit's Progress    Patient Stated       Read and pray more       Depression Screen     08/22/2024    2:54 PM 02/28/2024    1:54 PM 05/12/2023    2:08 PM 01/14/2023    2:30 PM 04/30/2022    2:51 PM 06/02/2021    1:47 PM 04/21/2021    3:06 PM  PHQ 2/9 Scores  PHQ - 2 Score 0 4 0 0 0 2 6  PHQ- 9 Score 0 18 0 6 0 11 16    Fall Risk     08/22/2024    2:59  PM 02/28/2024    1:53 PM 05/12/2023    2:12 PM 04/30/2022    3:00 PM 06/02/2021    1:49 PM  Fall Risk   Falls in the past year? 1 1 1 1 1   Number falls in past yr: 1 1 1  0 1  Injury with Fall? 1 1 1  0 0  Risk for fall due to : History of fall(s);Impaired balance/gait;Orthopedic patient;Impaired mobility  History of fall(s) History of fall(s)   Follow up Falls evaluation completed;Education provided  Falls prevention discussed;Falls evaluation completed Falls prevention discussed;Falls evaluation completed  Falls evaluation completed      Data saved with a previous flowsheet row definition    MEDICARE RISK AT HOME:  Medicare Risk at Home Any stairs in or around the home?: Yes If so, are there any without handrails?: No Home free of loose throw rugs in walkways, pet beds, electrical cords, etc?: Yes Adequate lighting in your home to reduce risk of falls?: Yes Life alert?: No Use of a cane, walker or w/c?: Yes (rollator) Grab bars in the bathroom?: Yes Shower chair or bench in shower?: Yes Elevated toilet seat or a handicapped toilet?: No  TIMED UP AND GO:  Was the test performed?  No  Cognitive Function: 6CIT completed  08/22/2024    3:01 PM 05/12/2023    2:16 PM 12/03/2021   10:23 AM 04/21/2021    3:17 PM  6CIT Screen  What Year? 0 points 0 points 0 points 0 points  What month? 0 points 0 points 0 points 0 points  What time? 0 points 0 points 0 points 0 points  Count back from 20 0 points 0 points 0 points 0 points  Months in reverse 0 points 0 points 0 points 0 points  Repeat phrase 0 points 2 points 0 points 0 points  Total Score 0 points 2 points 0 points 0 points    Immunizations Immunization History  Administered Date(s) Administered   Fluad Quad(high Dose 65+) 09/04/2019, 08/19/2020, 09/01/2021   INFLUENZA, HIGH DOSE SEASONAL PF 09/08/2017   Pneumococcal Conjugate-13 08/19/2020    Screening Tests Health Maintenance  Topic Date Due   COVID-19 Vaccine (1)  Never done   Colonoscopy  01/23/2024   Zoster Vaccines- Shingrix (1 of 2) 09/18/2024 (Originally 11/02/1970)   Influenza Vaccine  02/12/2025 (Originally 06/15/2024)   Pneumococcal Vaccine: 50+ Years (2 of 2 - PPSV23, PCV20, or PCV21) 06/18/2025 (Originally 10/14/2020)   HEMOGLOBIN A1C  12/07/2024   FOOT EXAM  12/13/2024   OPHTHALMOLOGY EXAM  03/05/2025   Diabetic kidney evaluation - eGFR measurement  06/06/2025   Diabetic kidney evaluation - Urine ACR  06/06/2025   Medicare Annual Wellness (AWV)  08/22/2025   Hepatitis C Screening  Completed   Meningococcal B Vaccine  Aged Out   DTaP/Tdap/Td  Discontinued   Fecal DNA (Cologuard)  Discontinued    Health Maintenance Items Addressed: See Nurse Notes at the end of this note  Additional Screening:  Vision Screening: Recommended annual ophthalmology exams for early detection of glaucoma and other disorders of the eye. Is the patient up to date with their annual eye exam?  Yes  Who is the provider or what is the name of the office in which the patient attends annual eye exams? [facility]  Dental Screening: Recommended annual dental exams for proper oral hygiene  Community Resource Referral / Chronic Care Management: CRR required this visit?  No   CCM required this visit?  No   Plan:    I have personally reviewed and noted the following in the patient's chart:   Medical and social history Use of alcohol, tobacco or illicit drugs  Current medications and supplements including opioid prescriptions. Patient is not currently taking opioid prescriptions. Functional ability and status Nutritional status Physical activity Advanced directives List of other physicians Hospitalizations, surgeries, and ER visits in previous 12 months Vitals Screenings to include cognitive, depression, and falls Referrals and appointments  In addition, I have reviewed and discussed with patient certain preventive protocols, quality  metrics, and best practice recommendations. A written personalized care plan for preventive services as well as general preventive health recommendations were provided to patient.   Vina Ned, CMA   08/22/2024   After Visit Summary: (MyChart) Due to this being a telephonic visit, the after visit summary with patients personalized plan was offered to patient via MyChart   Notes:  FBS this morning per patient was 92 Gave ph# to [facility] to schedule colonoscopy Declined all vaccines

## 2024-08-23 NOTE — Telephone Encounter (Signed)
 Requested Prescriptions  Pending Prescriptions Disp Refills   insulin  NPH Human (NOVOLIN N) 100 UNIT/ML injection [Pharmacy Med Name: NOVOLIN N 100 UNIT/ML VIAL] 40 mL 0    Sig: INJECT 40 UNITS (0.4 ML) INTO THE SKIN AT BEDTIME. MAY USE REDUCED DOSE AS ADVISED.(NOT COVERED)     Endocrinology:  Diabetes - Insulins Passed - 08/23/2024  2:55 PM      Passed - HBA1C is between 0 and 7.9 and within 180 days    Hgb A1c MFr Bld  Date Value Ref Range Status  06/06/2024 6.4 (H) <5.7 % Final    Comment:    For someone without known diabetes, a hemoglobin  A1c value between 5.7% and 6.4% is consistent with prediabetes and should be confirmed with a  follow-up test. . For someone with known diabetes, a value <7% indicates that their diabetes is well controlled. A1c targets should be individualized based on duration of diabetes, age, comorbid conditions, and other considerations. . This assay result is consistent with an increased risk of diabetes. . Currently, no consensus exists regarding use of hemoglobin A1c for diagnosis of diabetes for children. SABRA Amy - Valid encounter within last 6 months    Recent Outpatient Visits           2 months ago Annual physical exam   [facility] [place], Marsa PARAS, DO   4 months ago ALS (amyotrophic lateral sclerosis) [facility])   [facility] [place] Marsa PARAS, DO   5 months ago Scalp pain    [facility] [place] Marsa PARAS, DO   7 months ago Bronchitis    [facility] [place], Marsa PARAS, [place]

## 2024-08-29 ENCOUNTER — Telehealth: Payer: Self-pay | Admitting: Gastroenterology

## 2024-08-29 NOTE — Telephone Encounter (Signed)
 Inbound call from patient requesting to schedule recall colonoscopy. Patient's last 2 colonoscopies with Dr. Wilhelmenia have taken place at the hospital. Patient is wishing for a call to confirm colonoscopy can be done on procedure floor. Please advise, thank you

## 2024-08-29 NOTE — Telephone Encounter (Signed)
 See note from path letter per Dr Wilhelmenia   I recommend you have a repeat colonoscopy in 3 years to determine if you have developed any new precancerous polyps and to screen for colorectal cancer.  You may have this procedure done with Dr. Unk at [facility].   The pt has been advised

## 2024-08-29 NOTE — Addendum Note (Signed)
 Addended by: EDMAN MARSA PARAS on: 08/29/2024 06:51 PM   Modules accepted: Orders

## 2024-08-31 MED ORDER — EMPAGLIFLOZIN 25 MG PO TABS: 25.0000 mg | ORAL_TABLET | Freq: Every day | ORAL | 0 refills | Status: DC

## 2024-08-31 NOTE — Addendum Note (Signed)
 Addended by: ZELIA GAUZE D on: 08/31/2024 03:47 PM   Modules accepted: Orders

## 2024-10-03 ENCOUNTER — Encounter: Payer: Self-pay | Admitting: Family Medicine

## 2024-10-17 ENCOUNTER — Encounter: Payer: Self-pay | Admitting: Family Medicine

## 2024-10-30 ENCOUNTER — Encounter: Payer: Self-pay | Admitting: Family Medicine

## 2024-10-30 ENCOUNTER — Ambulatory Visit: Admitting: Family Medicine

## 2024-10-30 VITALS — BP 120/66 | HR 76 | Ht 69.0 in

## 2024-10-30 DIAGNOSIS — Z9889 Other specified postprocedural states: Secondary | ICD-10-CM

## 2024-10-30 DIAGNOSIS — M545 Low back pain, unspecified: Secondary | ICD-10-CM

## 2024-10-30 NOTE — Progress Notes (Signed)
 Subjective:    Patient ID: Jonathan Shelton, male    DOB: [birth year], 73 y.o.   MRN: [id number]  Jonathan Shelton is a 73 y.o. male presenting on 10/30/2024 for Back Pain [name] about 1 month ago)   HPI  Discussed the use of AI scribe software for clinical note transcription with the patient, who gave verbal consent to proceed.  History of Present Illness   Jonathan Shelton is a 73 year old male who presents with persistent back pain following a fall.  Acute on Chronic Back pain - Persistent back pain since a fall approximately four to five weeks ago. - Initial pain localized to the left mid-flank back rib area, now radiating to the lower back. - Pain described as aching, most noticeable in the morning upon waking and getting up. - Slight improvement noted, but pain remains largely unchanged over the past month. - No usual back pain unless associated with a fall. - History of kyphoplasty in 2022 for previous L1 vertebra injury, contributing to concern about current lower back pain.  Analgesic use and adverse effects - Aleve  provides relief for about a day, but he is cautious about frequent use. - Ibuprofen 600 mg prescribed for previous shoulder injury caused significant constipation, making it undesirable. - Gabapentin  avoided due to adverse effects.  Gastrointestinal symptoms - Constipation occurs when experiencing back pain. - Ibuprofen use associated with significant constipation.           08/22/2024    2:54 PM 02/28/2024    1:54 PM 05/12/2023    2:08 PM  Depression screen PHQ 2/9  Decreased Interest 0 3 0  Down, Depressed, Hopeless 0 1 0  PHQ - 2 Score 0 4 0  Altered sleeping 0 2 0  Tired, decreased energy 0 3 0  Change in appetite 0 2 0  Feeling bad or failure about yourself  0 2 0  Trouble concentrating 0 1 0  Moving slowly or fidgety/restless 0 3 0  Suicidal thoughts 0 1 0  PHQ-9 Score 0  18  0   Difficult doing work/chores Not difficult at all Very  difficult Not difficult at all     Data saved with a previous flowsheet row definition       02/28/2024    1:54 PM 01/14/2023    2:30 PM 06/02/2021    1:48 PM  GAD 7 : Generalized Anxiety Score  Nervous, Anxious, on Edge 0 0 0  Control/stop worrying 0 0 0  Worry too much - different things 0 0 0  Trouble relaxing 0 0 0  Restless 0 0 0  Easily annoyed or irritable 0  0  Afraid - awful might happen 0 0 0  Total GAD 7 Score 0  0  Anxiety Difficulty Somewhat difficult Not difficult at all Not difficult at all    Social History[1]  Review of Systems Per HPI unless specifically indicated above     Objective:    BP 120/66 (BP Location: Right Arm, Patient Position: Sitting, Cuff Size: Normal)   Pulse 76   Ht 5' 9 (1.753 m)   SpO2 94%   BMI 29.98 kg/m   Wt Readings from Last 3 Encounters:  08/22/24 203 lb (92.1 kg)  08/14/24 204 lb (92.5 kg)  06/18/24 207 lb 2 oz (94 kg)    Physical Exam Vitals and nursing note reviewed.  Constitutional:      General: He is not in acute distress.    Appearance:  He is well-developed. He is not diaphoretic.     Comments: Well-appearing, comfortable, cooperative  HENT:     Head: Normocephalic and atraumatic.     Comments: Hoarse voice Eyes:     General:        Right eye: No discharge.        Left eye: No discharge.     Conjunctiva/sclera: Conjunctivae normal.  Neck:     Thyroid : No thyromegaly.  Pulmonary:     Effort: Pulmonary effort is normal. No respiratory distress.     Breath sounds: Normal breath sounds. No wheezing or rales.  Musculoskeletal:     Cervical back: Normal range of motion and neck supple.     Comments: Muscle stiffness and some reduced ROM  Localized pain to Left low back paraspinal region radiating down into lower spine. Non tender over ribs or lateral flank.  Rolling walker  Lymphadenopathy:     Cervical: No cervical adenopathy.  Skin:    General: Skin is warm and dry.     Findings: No erythema or rash.   Neurological:     Mental Status: He is alert and oriented to person, place, and time. Mental status is at baseline.  Psychiatric:        Behavior: Behavior normal.     Comments: Well groomed, good eye contact, stable now with slowed speech, occasional word finding issue.     I have personally reviewed the radiology report from 05/29/23 - CT Lumbar Spine.  CLINICAL DATA:  Back trauma.  Prior kyphoplasty L1.   EXAM: CT LUMBAR SPINE WITHOUT CONTRAST   TECHNIQUE: Multidetector CT imaging of the lumbar spine was performed without intravenous contrast administration. Multiplanar CT image reconstructions were also generated.   RADIATION DOSE REDUCTION: This exam was performed according to the departmental dose-optimization program which includes automated exposure control, adjustment of the mA and/or kV according to patient size and/or use of iterative reconstruction technique.   COMPARISON:  Multiple exams, including 12/10/2020 radiographs and MRI from 11/22/2020   FINDINGS: Segmentation: The lowest lumbar type non-rib-bearing vertebra is labeled as L5.   Alignment: No vertebral subluxation is observed.   Vertebrae: Vertebral augmentation at L1 with evidence of remote prior compression fracture with 25% loss of vertebral body heights, which is similar to minimally increased loss of vertebral body heights compared to the 02/05/2021 kyphoplasty, but no signs of recent subsidence.   Multilevel anterior interbody spurring most notably at L2-3 and L3-4.   Bridging spurring along the anterior upper SI joints, left greater than right.   Paraspinal and other soft tissues: Mild abdominal aortic atherosclerosis. Fluid density 2.9 cm exophytic Bosniak category 1 cyst from the right mid upper kidney posteromedially, image 39 series 4. No further imaging workup of this lesion is indicated. Similar benign cyst measuring 1.2 cm from the right kidney upper pole on image 20 series 4. No  further imaging workup of this lesion is indicated. Suspected scarring along the left kidney upper pole as shown on prior CT chest from 07/17/2020, no further workup required.   Disc levels: T12-L1: Unremarkable   L1-2: Unremarkable   L2-3: Mild central narrowing of the thecal sac due to disc osteophyte complex.   L3-4: Moderate central narrowing of the thecal sac with mild right and borderline left foraminal stenosis due to disc bulge and facet arthropathy.   L4-5: Moderate central narrowing of the thecal sac with mild left for foraminal stenosis due to disc bulge and facet arthropathy.   L5-S1: Mild  left foraminal stenosis due to facet arthropathy. Mild disc bulge.   IMPRESSION: 1. No acute lumbar spine findings. 2. Prior kyphoplasty at L1 with 25% loss of vertebral body heights, similar to minimally increased loss of vertebral body heights compared to the 02/05/2021 kyphoplasty, but no signs of acute/recent subsidence. 3. Lumbar spondylosis and degenerative disc disease causing moderate impingement at L3-4 and L4-5, and mild impingement at L2-3 and L5-S1. 4. Aortic atherosclerosis.   Aortic Atherosclerosis (ICD10-I70.0).     Electronically Signed   By: Ryan Salvage M.D.   On: 05/29/2023 13:33  Results for orders placed or performed in visit on 08/14/24  Microscopic Examination   Collection Time: 08/14/24 11:20 AM   Urine  Result Value Ref Range   WBC, UA 0-5 0 - 5 /hpf   RBC, Urine 0-2 0 - 2 /hpf   Epithelial Cells (non renal) 0-10 0 - 10 /hpf   Bacteria, UA Few None seen/Few  Urinalysis, Complete   Collection Time: 08/14/24 11:20 AM  Result Value Ref Range   Specific Gravity, UA 1.020 1.005 - 1.030   pH, UA 6.0 5.0 - 7.5   Color, UA Yellow Yellow   Appearance Ur Clear Clear   Leukocytes,UA Negative Negative   Protein,UA Trace Negative/Trace   Glucose, UA 3+ (A) Negative   Ketones, UA Negative Negative   RBC, UA Negative Negative   Bilirubin, UA  Negative Negative   Urobilinogen, Ur 0.2 0.2 - 1.0 mg/dL   Nitrite, UA Negative Negative   Microscopic Examination Comment    Microscopic Examination See below:   BLADDER SCAN AMB NON-IMAGING   Collection Time: 08/14/24 11:38 AM  Result Value Ref Range   Scan Result 75ml       Assessment & Plan:   Problem List Items Addressed This Visit     Acute low back pain - Primary   Relevant Orders   CT Lumbar Spine Wo Contrast   Other Visit Diagnoses       S/P kyphoplasty       Relevant Orders   CT Lumbar Spine Wo Contrast        Acute on Chronic low back pain after fall with history of kyphoplasty L1 Acute on Chronic low back pain persisting post-fall 4-6 weeks ago in bathroom accidental injury, Known prior back surgical procedure with kyphoplasty at L1 in 2022. Pain radiates from left mid flank to lower back, exacerbated in the morning. Differential includes contusion or accumulation injury. Imaging needed to rule out obvious injury, no X-ray done yet given history of kyphoplasty and compression injury in past, opted for CT imaging . - Ordered CT scan of lumbar spine, pending insurance approval. - If CT scan is not approved, will order x-ray of lumbar spine. - Advised use of heat for muscle relaxation and ice for post-activity cooling. - Continue Aleve  as needed for pain management.     Route chart to [facility] for review and approval / scheduling  Orders Placed This Encounter  Procedures   CT Lumbar Spine Wo Contrast    Standing Status:   Future    Expiration Date:   10/30/2025    Preferred imaging location?:   [facility]    No orders of the defined types were placed in this encounter.   Follow up plan: Return if symptoms worsen or fail to improve.   Marsa Officer, DO [facility] 10/30/2024, 11:51 AM     [1]  Social History Tobacco Use  Smoking status: Former    Current packs/day: 0.00    Types:  Cigarettes    Quit date: 1970    Years since quitting: 55.9   Smokeless tobacco: Former    Types: Snuff    Quit date: 1973  Vaping Use   Vaping status: Never Used  Substance Use Topics   Alcohol use: Not Currently   Drug use: Not Currently    Comment: past

## 2024-10-30 NOTE — Patient Instructions (Addendum)
 Thank you for coming to the office today.  We will work on approval for CT Scan for Lumbar Spine  Heating pad is a good option  Use Naproxen  / Aleve  as needed up to 500mg  twice a day with meal short term if you need it  Recommend to start taking Tylenol  Extra Strength 500mg  tabs - take 1 to 2 tabs per dose (max 1000mg ) every 6-8 hours for pain (take regularly, don't skip a dose for next 7 days), max 24 hour daily dose is 6 tablets or 3000mg . In the future you can repeat the same everyday Tylenol  course for 1-2 weeks at a time.   Please schedule a Follow-up Appointment to: Return if symptoms worsen or fail to improve.  If you have any other questions or concerns, please feel free to call the office or send a message through MyChart. You may also schedule an earlier appointment if necessary.  Additionally, you may be receiving a survey about your experience at our office within a few days to 1 week by e-mail or mail. We value your feedback.  Marsa Officer, DO [facility], [place]

## 2024-12-01 ENCOUNTER — Other Ambulatory Visit: Payer: Self-pay | Admitting: Family Medicine

## 2024-12-01 DIAGNOSIS — E1169 Type 2 diabetes mellitus with other specified complication: Secondary | ICD-10-CM

## 2024-12-03 NOTE — Telephone Encounter (Signed)
 Requested Prescriptions  Pending Prescriptions Disp Refills   metFORMIN  (GLUCOPHAGE ) 1000 MG tablet [Pharmacy Med Name: METFORMIN  HCL 1,000 MG TABLET] 180 tablet 0    Sig: TAKE 1 TABLET (1,000 MG TOTAL) BY MOUTH TWICE A DAY WITH FOOD     Endocrinology:  Diabetes - Biguanides Failed - 12/03/2024 11:32 AM      Failed - eGFR in normal range and within 360 days    GFR, Est African American  Date Value Ref Range Status  08/12/2020 112 > OR = 60 mL/min/1.49m2 Final   GFR, Est Non African American  Date Value Ref Range Status  08/12/2020 96 > OR = 60 mL/min/1.81m2 Final   GFR, Estimated  Date Value Ref Range Status  09/01/2021 >60 >60 mL/min Final    Comment:    (NOTE) Calculated using the CKD-EPI Creatinine Equation (2021)    eGFR  Date Value Ref Range Status  06/10/2023 92 > OR = 60 mL/min/1.61m2 Final         Passed - Cr in normal range and within 360 days    Creat  Date Value Ref Range Status  06/06/2024 0.80 0.70 - 1.28 mg/dL Final   Creatinine, Urine  Date Value Ref Range Status  06/06/2024 93 20 - 320 mg/dL Final         Passed - HBA1C is between 0 and 7.9 and within 180 days    Hgb A1c MFr Bld  Date Value Ref Range Status  06/06/2024 6.4 (H) <5.7 % Final    Comment:    For someone without known diabetes, a hemoglobin  A1c value between 5.7% and 6.4% is consistent with prediabetes and should be confirmed with a  follow-up test. . For someone with known diabetes, a value <7% indicates that their diabetes is well controlled. A1c targets should be individualized based on duration of diabetes, age, comorbid conditions, and other considerations. . This assay result is consistent with an increased risk of diabetes. . Currently, no consensus exists regarding use of hemoglobin A1c for diagnosis of diabetes for children. .          Passed - B12 Level in normal range and within 720 days    Vitamin B-12  Date Value Ref Range Status  06/06/2024 688 200 - 1,100  pg/mL Final         Passed - Valid encounter within last 6 months    Recent Outpatient Visits           1 month ago Acute left-sided low back pain without sciatica   [place] [facility] [place], Marsa PARAS, DO   5 months ago Annual physical exam   [place] [facility] [place] Marsa PARAS, DO   7 months ago ALS (amyotrophic lateral sclerosis) [facility])   [place] [facility] [place] Marsa PARAS, DO   9 months ago Scalp pain   [place] [facility] [place] Marsa PARAS, DO   10 months ago Bronchitis   [facility] [place], Marsa PARAS, DO              Passed - CBC within normal limits and completed in the last 12 months    WBC  Date Value Ref Range Status  06/06/2024 6.7 3.8 - 10.8 Thousand/uL Final   RBC  Date Value Ref Range Status  06/06/2024 4.92 4.20 - 5.80 Million/uL Final   Hemoglobin  Date Value Ref Range Status  06/06/2024 15.3  13.2 - 17.1 g/dL Final   HCT  Date Value Ref Range Status  06/06/2024 46.5 38.5 - 50.0 % Final   MCHC  Date Value Ref Range Status  06/06/2024 32.9 32.0 - 36.0 g/dL Final    Comment:    For adults, a slight decrease in the calculated MCHC value (in the range of 30 to 32 g/dL) is most likely not clinically significant; however, it should be interpreted with caution in correlation with other red cell parameters and the patient's clinical condition.    [facility]  Date Value Ref Range Status  06/06/2024 31.1 27.0 - 33.0 pg Final   MCV  Date Value Ref Range Status  06/06/2024 94.5 80.0 - 100.0 fL Final   No results found for: PLTCOUNTKUC, LABPLAT, POCPLA RDW  Date Value Ref Range Status  06/06/2024 13.1 11.0 - 15.0 % Final          JARDIANCE  25 MG TABS tablet [Pharmacy Med Name: JARDIANCE  25 MG TABLET] 90 tablet 0    Sig: TAKE 1 TABLET (25 MG TOTAL) BY MOUTH DAILY.     Endocrinology:  Diabetes - SGLT2  Inhibitors Failed - 12/03/2024 11:32 AM      Failed - eGFR in normal range and within 360 days    GFR, Est African American  Date Value Ref Range Status  08/12/2020 112 > OR = 60 mL/min/1.55m2 Final   GFR, Est Non African American  Date Value Ref Range Status  08/12/2020 96 > OR = 60 mL/min/1.43m2 Final   GFR, Estimated  Date Value Ref Range Status  09/01/2021 >60 >60 mL/min Final    Comment:    (NOTE) Calculated using the CKD-EPI Creatinine Equation (2021)    eGFR  Date Value Ref Range Status  06/10/2023 92 > OR = 60 mL/min/1.52m2 Final         Passed - Cr in normal range and within 360 days    Creat  Date Value Ref Range Status  06/06/2024 0.80 0.70 - 1.28 mg/dL Final   Creatinine, Urine  Date Value Ref Range Status  06/06/2024 93 20 - 320 mg/dL Final         Passed - HBA1C is between 0 and 7.9 and within 180 days    Hgb A1c MFr Bld  Date Value Ref Range Status  06/06/2024 6.4 (H) <5.7 % Final    Comment:    For someone without known diabetes, a hemoglobin  A1c value between 5.7% and 6.4% is consistent with prediabetes and should be confirmed with a  follow-up test. . For someone with known diabetes, a value <7% indicates that their diabetes is well controlled. A1c targets should be individualized based on duration of diabetes, age, comorbid conditions, and other considerations. . This assay result is consistent with an increased risk of diabetes. . Currently, no consensus exists regarding use of hemoglobin A1c for diagnosis of diabetes for children. SABRA Amy - Valid encounter within last 6 months    Recent Outpatient Visits           1 month ago Acute left-sided low back pain without sciatica   [place] [facility] [place], Marsa PARAS, DO   5 months ago Annual physical exam   [place] [facility] [place] Marsa PARAS, [place]   7 months ago ALS (amyotrophic lateral sclerosis) [facility])   [place]  [facility] [place] PARAS, [place]   9  months ago Scalp pain   [place] [facility] [place] Marsa PARAS, [place]   10 months ago Bronchitis   [facility] [place], Marsa PARAS, [place]

## 2024-12-12 ENCOUNTER — Encounter: Payer: Self-pay | Admitting: Family Medicine

## 2024-12-24 ENCOUNTER — Ambulatory Visit: Admitting: Family Medicine

## 2025-08-30 ENCOUNTER — Ambulatory Visit
